# Patient Record
Sex: Female | Born: 1950 | Race: Black or African American | Hispanic: No | Marital: Married | Smoking: Former smoker
Health system: Southern US, Community
[De-identification: ages and names within clinical notes are randomized; demographics above are authoritative.]

## PROBLEM LIST (undated history)

## (undated) DIAGNOSIS — Z9889 Other specified postprocedural states: Secondary | ICD-10-CM

## (undated) DIAGNOSIS — R112 Nausea with vomiting, unspecified: Secondary | ICD-10-CM

## (undated) DIAGNOSIS — M3500 Sicca syndrome, unspecified: Secondary | ICD-10-CM

## (undated) DIAGNOSIS — H269 Unspecified cataract: Secondary | ICD-10-CM

## (undated) DIAGNOSIS — E854 Organ-limited amyloidosis: Secondary | ICD-10-CM

## (undated) DIAGNOSIS — N189 Chronic kidney disease, unspecified: Secondary | ICD-10-CM

## (undated) DIAGNOSIS — M1990 Unspecified osteoarthritis, unspecified site: Secondary | ICD-10-CM

## (undated) DIAGNOSIS — I10 Essential (primary) hypertension: Secondary | ICD-10-CM

## (undated) DIAGNOSIS — J45909 Unspecified asthma, uncomplicated: Secondary | ICD-10-CM

## (undated) DIAGNOSIS — Z9989 Dependence on other enabling machines and devices: Secondary | ICD-10-CM

## (undated) DIAGNOSIS — R519 Headache, unspecified: Secondary | ICD-10-CM

## (undated) DIAGNOSIS — K219 Gastro-esophageal reflux disease without esophagitis: Secondary | ICD-10-CM

## (undated) DIAGNOSIS — M329 Systemic lupus erythematosus, unspecified: Secondary | ICD-10-CM

## (undated) DIAGNOSIS — B009 Herpesviral infection, unspecified: Secondary | ICD-10-CM

## (undated) DIAGNOSIS — I739 Peripheral vascular disease, unspecified: Secondary | ICD-10-CM

## (undated) DIAGNOSIS — C8300 Small cell B-cell lymphoma, unspecified site: Secondary | ICD-10-CM

## (undated) DIAGNOSIS — I639 Cerebral infarction, unspecified: Secondary | ICD-10-CM

## (undated) HISTORY — PX: BACK SURGERY: SHX140

## (undated) HISTORY — DX: Systemic lupus erythematosus, unspecified: M32.9

## (undated) HISTORY — PX: CHOLECYSTECTOMY: SHX55

## (undated) HISTORY — PX: MASS EXCISION: SHX2000

## (undated) HISTORY — DX: Small cell B-cell lymphoma, unspecified site: C83.00

## (undated) HISTORY — PX: FOOT SURGERY: SHX648

## (undated) HISTORY — PX: MRI: SHX5353

## (undated) HISTORY — PX: ABDOMINAL HYSTERECTOMY: SHX81

## (undated) HISTORY — PX: OTHER SURGICAL HISTORY: SHX169

## (undated) HISTORY — PX: COLONOSCOPY: SHX5424

---

## 1998-02-23 ENCOUNTER — Ambulatory Visit (HOSPITAL_COMMUNITY): Admission: RE | Admit: 1998-02-23 | Discharge: 1998-02-23 | Payer: Self-pay | Admitting: Gynecology

## 2000-08-21 ENCOUNTER — Other Ambulatory Visit: Admission: RE | Admit: 2000-08-21 | Discharge: 2000-08-21 | Payer: Self-pay | Admitting: Gynecology

## 2000-09-21 ENCOUNTER — Other Ambulatory Visit: Admission: RE | Admit: 2000-09-21 | Discharge: 2000-09-21 | Payer: Self-pay | Admitting: Otolaryngology

## 2002-07-29 ENCOUNTER — Emergency Department (HOSPITAL_COMMUNITY): Admission: EM | Admit: 2002-07-29 | Discharge: 2002-07-29 | Payer: Self-pay | Admitting: Emergency Medicine

## 2003-11-17 ENCOUNTER — Other Ambulatory Visit: Admission: RE | Admit: 2003-11-17 | Discharge: 2003-11-17 | Payer: Self-pay | Admitting: Gynecology

## 2005-03-26 ENCOUNTER — Emergency Department (HOSPITAL_COMMUNITY): Admission: EM | Admit: 2005-03-26 | Discharge: 2005-03-27 | Payer: Self-pay | Admitting: Emergency Medicine

## 2005-03-28 ENCOUNTER — Encounter: Admission: RE | Admit: 2005-03-28 | Discharge: 2005-03-28 | Payer: Self-pay

## 2005-03-29 ENCOUNTER — Ambulatory Visit: Payer: Self-pay | Admitting: Internal Medicine

## 2005-03-29 ENCOUNTER — Inpatient Hospital Stay (HOSPITAL_COMMUNITY): Admission: AD | Admit: 2005-03-29 | Discharge: 2005-03-31 | Payer: Self-pay | Admitting: Internal Medicine

## 2005-03-30 ENCOUNTER — Encounter (INDEPENDENT_AMBULATORY_CARE_PROVIDER_SITE_OTHER): Payer: Self-pay | Admitting: *Deleted

## 2005-04-01 ENCOUNTER — Ambulatory Visit: Payer: Self-pay | Admitting: Pulmonary Disease

## 2005-04-01 ENCOUNTER — Inpatient Hospital Stay (HOSPITAL_COMMUNITY): Admission: EM | Admit: 2005-04-01 | Discharge: 2005-04-04 | Payer: Self-pay | Admitting: Emergency Medicine

## 2005-04-01 ENCOUNTER — Ambulatory Visit: Payer: Self-pay | Admitting: Internal Medicine

## 2005-04-02 ENCOUNTER — Encounter (INDEPENDENT_AMBULATORY_CARE_PROVIDER_SITE_OTHER): Payer: Self-pay | Admitting: Specialist

## 2005-04-18 ENCOUNTER — Ambulatory Visit: Payer: Self-pay | Admitting: Internal Medicine

## 2005-06-01 ENCOUNTER — Ambulatory Visit: Payer: Self-pay | Admitting: Internal Medicine

## 2005-07-11 ENCOUNTER — Encounter: Admission: RE | Admit: 2005-07-11 | Discharge: 2005-07-11 | Payer: Self-pay

## 2005-09-29 ENCOUNTER — Ambulatory Visit: Payer: Self-pay | Admitting: Internal Medicine

## 2006-01-02 ENCOUNTER — Ambulatory Visit: Payer: Self-pay | Admitting: Internal Medicine

## 2006-04-28 ENCOUNTER — Ambulatory Visit: Payer: Self-pay | Admitting: Internal Medicine

## 2006-05-01 ENCOUNTER — Ambulatory Visit: Payer: Self-pay | Admitting: Internal Medicine

## 2006-05-01 ENCOUNTER — Inpatient Hospital Stay (HOSPITAL_COMMUNITY): Admission: AD | Admit: 2006-05-01 | Discharge: 2006-05-03 | Payer: Self-pay | Admitting: Internal Medicine

## 2006-05-08 ENCOUNTER — Ambulatory Visit: Payer: Self-pay | Admitting: Pulmonary Disease

## 2006-05-08 ENCOUNTER — Ambulatory Visit: Payer: Self-pay | Admitting: Internal Medicine

## 2006-05-16 ENCOUNTER — Ambulatory Visit: Payer: Self-pay | Admitting: Internal Medicine

## 2006-07-19 ENCOUNTER — Ambulatory Visit: Payer: Self-pay | Admitting: Internal Medicine

## 2006-09-15 ENCOUNTER — Inpatient Hospital Stay (HOSPITAL_COMMUNITY): Admission: EM | Admit: 2006-09-15 | Discharge: 2006-09-20 | Payer: Self-pay | Admitting: Emergency Medicine

## 2006-10-04 ENCOUNTER — Ambulatory Visit: Payer: Self-pay | Admitting: Internal Medicine

## 2006-11-07 ENCOUNTER — Encounter: Admission: RE | Admit: 2006-11-07 | Discharge: 2006-11-07 | Payer: Self-pay | Admitting: Neurology

## 2006-11-17 ENCOUNTER — Ambulatory Visit (HOSPITAL_COMMUNITY): Admission: RE | Admit: 2006-11-17 | Discharge: 2006-11-18 | Payer: Self-pay | Admitting: Neurosurgery

## 2007-03-22 ENCOUNTER — Emergency Department (HOSPITAL_COMMUNITY): Admission: EM | Admit: 2007-03-22 | Discharge: 2007-03-22 | Payer: Self-pay | Admitting: Emergency Medicine

## 2007-03-24 ENCOUNTER — Emergency Department (HOSPITAL_COMMUNITY): Admission: EM | Admit: 2007-03-24 | Discharge: 2007-03-25 | Payer: Self-pay | Admitting: Emergency Medicine

## 2007-03-25 ENCOUNTER — Inpatient Hospital Stay (HOSPITAL_COMMUNITY): Admission: EM | Admit: 2007-03-25 | Discharge: 2007-04-25 | Payer: Self-pay | Admitting: *Deleted

## 2007-03-25 ENCOUNTER — Ambulatory Visit: Payer: Self-pay | Admitting: Pulmonary Disease

## 2007-03-25 ENCOUNTER — Ambulatory Visit: Payer: Self-pay | Admitting: Hematology & Oncology

## 2007-03-29 ENCOUNTER — Encounter (INDEPENDENT_AMBULATORY_CARE_PROVIDER_SITE_OTHER): Payer: Self-pay | Admitting: Surgery

## 2007-04-02 ENCOUNTER — Encounter (INDEPENDENT_AMBULATORY_CARE_PROVIDER_SITE_OTHER): Payer: Self-pay | Admitting: Interventional Radiology

## 2007-04-06 ENCOUNTER — Ambulatory Visit: Payer: Self-pay | Admitting: Vascular Surgery

## 2007-04-06 ENCOUNTER — Encounter (INDEPENDENT_AMBULATORY_CARE_PROVIDER_SITE_OTHER): Payer: Self-pay | Admitting: Internal Medicine

## 2007-04-12 ENCOUNTER — Encounter (INDEPENDENT_AMBULATORY_CARE_PROVIDER_SITE_OTHER): Payer: Self-pay | Admitting: Interventional Radiology

## 2007-05-02 ENCOUNTER — Ambulatory Visit: Payer: Self-pay | Admitting: Hematology & Oncology

## 2007-05-14 DIAGNOSIS — E669 Obesity, unspecified: Secondary | ICD-10-CM

## 2007-05-14 DIAGNOSIS — J8410 Pulmonary fibrosis, unspecified: Secondary | ICD-10-CM | POA: Insufficient documentation

## 2007-05-14 DIAGNOSIS — K219 Gastro-esophageal reflux disease without esophagitis: Secondary | ICD-10-CM | POA: Insufficient documentation

## 2007-05-14 DIAGNOSIS — M1990 Unspecified osteoarthritis, unspecified site: Secondary | ICD-10-CM

## 2007-05-14 DIAGNOSIS — E66811 Obesity, class 1: Secondary | ICD-10-CM | POA: Insufficient documentation

## 2007-05-18 LAB — CBC WITH DIFFERENTIAL/PLATELET
Basophils Absolute: 0 10*3/uL (ref 0.0–0.1)
Eosinophils Absolute: 0.1 10*3/uL (ref 0.0–0.5)
HGB: 11.4 g/dL — ABNORMAL LOW (ref 11.6–15.9)
NEUT#: 7.3 10*3/uL — ABNORMAL HIGH (ref 1.5–6.5)
RBC: 3.74 10*6/uL (ref 3.70–5.32)
RDW: 17.1 % — ABNORMAL HIGH (ref 11.3–14.5)
WBC: 10.6 10*3/uL — ABNORMAL HIGH (ref 3.9–10.0)
lymph#: 2.8 10*3/uL (ref 0.9–3.3)

## 2007-05-18 LAB — TECHNOLOGIST REVIEW

## 2007-05-30 ENCOUNTER — Ambulatory Visit (HOSPITAL_COMMUNITY): Admission: RE | Admit: 2007-05-30 | Discharge: 2007-05-30 | Payer: Self-pay | Admitting: Hematology & Oncology

## 2007-06-14 ENCOUNTER — Ambulatory Visit: Payer: Self-pay | Admitting: Hematology & Oncology

## 2007-06-18 LAB — CBC WITH DIFFERENTIAL/PLATELET
BASO%: 0.3 % (ref 0.0–2.0)
LYMPH%: 17.2 % (ref 14.0–48.0)
MCHC: 35 g/dL (ref 32.0–36.0)
MCV: 86.1 fL (ref 81.0–101.0)
MONO%: 11.3 % (ref 0.0–13.0)
Platelets: 209 10*3/uL (ref 145–400)
RBC: 3.37 10*6/uL — ABNORMAL LOW (ref 3.70–5.32)
RDW: 14.2 % (ref 11.3–14.5)
WBC: 2.9 10*3/uL — ABNORMAL LOW (ref 3.9–10.0)

## 2007-06-18 LAB — COMPREHENSIVE METABOLIC PANEL
ALT: 14 U/L (ref 0–35)
AST: 18 U/L (ref 0–37)
Alkaline Phosphatase: 79 U/L (ref 39–117)
Potassium: 3.5 mEq/L (ref 3.5–5.3)
Sodium: 145 mEq/L (ref 135–145)
Total Bilirubin: 0.5 mg/dL (ref 0.3–1.2)
Total Protein: 5.8 g/dL — ABNORMAL LOW (ref 6.0–8.3)

## 2007-06-20 ENCOUNTER — Encounter: Admission: RE | Admit: 2007-06-20 | Discharge: 2007-06-20 | Payer: Self-pay | Admitting: Hematology & Oncology

## 2007-07-02 LAB — CBC WITH DIFFERENTIAL/PLATELET
EOS%: 1 % (ref 0.0–7.0)
Eosinophils Absolute: 0.1 10*3/uL (ref 0.0–0.5)
MCV: 87.6 fL (ref 81.0–101.0)
MONO%: 8.9 % (ref 0.0–13.0)
NEUT#: 6.4 10*3/uL (ref 1.5–6.5)
RBC: 3.93 10*6/uL (ref 3.70–5.32)
RDW: 12.8 % (ref 11.3–14.5)

## 2007-07-02 LAB — COMPREHENSIVE METABOLIC PANEL
Albumin: 3.8 g/dL (ref 3.5–5.2)
Alkaline Phosphatase: 81 U/L (ref 39–117)
BUN: 24 mg/dL — ABNORMAL HIGH (ref 6–23)
Creatinine, Ser: 1.04 mg/dL (ref 0.40–1.20)
Glucose, Bld: 112 mg/dL — ABNORMAL HIGH (ref 70–99)
Total Bilirubin: 0.5 mg/dL (ref 0.3–1.2)

## 2007-07-03 ENCOUNTER — Encounter: Admission: RE | Admit: 2007-07-03 | Discharge: 2007-07-03 | Payer: Self-pay | Admitting: Interventional Radiology

## 2007-07-04 LAB — SPEP & IFE WITH QIG
Beta Globulin: 6.5 % (ref 4.7–7.2)
IgA: 258 mg/dL (ref 68–378)
IgG (Immunoglobin G), Serum: 487 mg/dL — ABNORMAL LOW (ref 694–1618)
IgM, Serum: 71 mg/dL (ref 60–263)
Total Protein, Serum Electrophoresis: 6.1 g/dL (ref 6.0–8.3)

## 2007-07-16 LAB — CBC WITH DIFFERENTIAL/PLATELET
Basophils Absolute: 0 10*3/uL (ref 0.0–0.1)
EOS%: 3.8 % (ref 0.0–7.0)
HCT: 30.4 % — ABNORMAL LOW (ref 34.8–46.6)
HGB: 10.9 g/dL — ABNORMAL LOW (ref 11.6–15.9)
MCH: 31.7 pg (ref 26.0–34.0)
MONO#: 0.4 10*3/uL (ref 0.1–0.9)
NEUT#: 0.1 10*3/uL — CL (ref 1.5–6.5)
NEUT%: 8.1 % — ABNORMAL LOW (ref 39.6–76.8)
RDW: 16.1 % — ABNORMAL HIGH (ref 11.3–14.5)
WBC: 1 10*3/uL — ABNORMAL LOW (ref 3.9–10.0)
lymph#: 0.5 10*3/uL — ABNORMAL LOW (ref 0.9–3.3)

## 2007-07-16 LAB — COMPREHENSIVE METABOLIC PANEL
ALT: 11 U/L (ref 0–35)
AST: 15 U/L (ref 0–37)
Albumin: 3.6 g/dL (ref 3.5–5.2)
BUN: 18 mg/dL (ref 6–23)
CO2: 25 mEq/L (ref 19–32)
Calcium: 8.9 mg/dL (ref 8.4–10.5)
Chloride: 107 mEq/L (ref 96–112)
Potassium: 3.3 mEq/L — ABNORMAL LOW (ref 3.5–5.3)

## 2007-07-26 ENCOUNTER — Ambulatory Visit: Payer: Self-pay | Admitting: Hematology & Oncology

## 2007-07-30 LAB — CBC WITH DIFFERENTIAL/PLATELET
Basophils Absolute: 0.1 10*3/uL (ref 0.0–0.1)
Eosinophils Absolute: 0.1 10*3/uL (ref 0.0–0.5)
HCT: 34.2 % — ABNORMAL LOW (ref 34.8–46.6)
HGB: 12.1 g/dL (ref 11.6–15.9)
LYMPH%: 23.7 % (ref 14.0–48.0)
MONO#: 0.8 10*3/uL (ref 0.1–0.9)
NEUT#: 3.5 10*3/uL (ref 1.5–6.5)
NEUT%: 59 % (ref 39.6–76.8)
Platelets: 137 10*3/uL — ABNORMAL LOW (ref 145–400)
WBC: 5.9 10*3/uL (ref 3.9–10.0)

## 2007-08-03 LAB — SPEP & IFE WITH QIG
Alpha-1-Globulin: 5.5 % — ABNORMAL HIGH (ref 2.9–4.9)
Alpha-2-Globulin: 13.5 % — ABNORMAL HIGH (ref 7.1–11.8)
Beta Globulin: 6.3 % (ref 4.7–7.2)
Total Protein, Serum Electrophoresis: 6.7 g/dL (ref 6.0–8.3)

## 2007-08-03 LAB — COMPREHENSIVE METABOLIC PANEL
ALT: 16 U/L (ref 0–35)
AST: 21 U/L (ref 0–37)
Chloride: 104 mEq/L (ref 96–112)
Creatinine, Ser: 1.3 mg/dL — ABNORMAL HIGH (ref 0.40–1.20)
Sodium: 144 mEq/L (ref 135–145)
Total Bilirubin: 0.5 mg/dL (ref 0.3–1.2)
Total Protein: 6.7 g/dL (ref 6.0–8.3)

## 2007-08-27 LAB — CBC WITH DIFFERENTIAL/PLATELET
BASO%: 1 % (ref 0.0–2.0)
EOS%: 2.9 % (ref 0.0–7.0)
HCT: 32.7 % — ABNORMAL LOW (ref 34.8–46.6)
LYMPH%: 13.5 % — ABNORMAL LOW (ref 14.0–48.0)
MCH: 32.1 pg (ref 26.0–34.0)
MCHC: 34.8 g/dL (ref 32.0–36.0)
MONO#: 0.7 10*3/uL (ref 0.1–0.9)
NEUT%: 69.9 % (ref 39.6–76.8)
Platelets: DECREASED 10*3/uL (ref 145–400)
RBC: 3.55 10*6/uL — ABNORMAL LOW (ref 3.70–5.32)
WBC: 5.5 10*3/uL (ref 3.9–10.0)

## 2007-08-27 LAB — COMPREHENSIVE METABOLIC PANEL
Albumin: 4 g/dL (ref 3.5–5.2)
BUN: 18 mg/dL (ref 6–23)
CO2: 29 mEq/L (ref 19–32)
Calcium: 8.9 mg/dL (ref 8.4–10.5)
Chloride: 110 mEq/L (ref 96–112)
Glucose, Bld: 93 mg/dL (ref 70–99)
Potassium: 3.2 mEq/L — ABNORMAL LOW (ref 3.5–5.3)
Sodium: 142 mEq/L (ref 135–145)
Total Protein: 6.7 g/dL (ref 6.0–8.3)

## 2007-08-29 LAB — PROTEIN ELECTROPHORESIS, SERUM
Alpha-1-Globulin: 5.3 % — ABNORMAL HIGH (ref 2.9–4.9)
Alpha-2-Globulin: 14.9 % — ABNORMAL HIGH (ref 7.1–11.8)
Beta Globulin: 5.8 % (ref 4.7–7.2)
Total Protein, Serum Electrophoresis: 7 g/dL (ref 6.0–8.3)

## 2007-09-17 ENCOUNTER — Ambulatory Visit (HOSPITAL_COMMUNITY): Admission: RE | Admit: 2007-09-17 | Discharge: 2007-09-17 | Payer: Self-pay | Admitting: Hematology & Oncology

## 2007-09-20 ENCOUNTER — Ambulatory Visit: Payer: Self-pay | Admitting: Hematology & Oncology

## 2007-09-24 LAB — CBC WITH DIFFERENTIAL/PLATELET
Basophils Absolute: 0.1 10*3/uL (ref 0.0–0.1)
Eosinophils Absolute: 0 10*3/uL (ref 0.0–0.5)
HGB: 12 g/dL (ref 11.6–15.9)
MONO#: 0.4 10*3/uL (ref 0.1–0.9)
NEUT#: 3.1 10*3/uL (ref 1.5–6.5)
RBC: 3.73 10*6/uL (ref 3.70–5.32)
RDW: 16.1 % — ABNORMAL HIGH (ref 11.3–14.5)
WBC: 3.9 10*3/uL (ref 3.9–10.0)
lymph#: 0.4 10*3/uL — ABNORMAL LOW (ref 0.9–3.3)

## 2007-09-26 LAB — IGG, IGA, IGM
IgA: 260 mg/dL (ref 68–378)
IgM, Serum: 20 mg/dL — ABNORMAL LOW (ref 60–263)

## 2007-09-26 LAB — COMPREHENSIVE METABOLIC PANEL
Albumin: 4.1 g/dL (ref 3.5–5.2)
BUN: 17 mg/dL (ref 6–23)
Calcium: 9.8 mg/dL (ref 8.4–10.5)
Chloride: 104 mEq/L (ref 96–112)
Glucose, Bld: 88 mg/dL (ref 70–99)
Potassium: 3 mEq/L — ABNORMAL LOW (ref 3.5–5.3)
Sodium: 146 mEq/L — ABNORMAL HIGH (ref 135–145)
Total Protein: 6.7 g/dL (ref 6.0–8.3)

## 2007-09-26 LAB — PROTEIN ELECTROPHORESIS, SERUM
Alpha-2-Globulin: 14.4 % — ABNORMAL HIGH (ref 7.1–11.8)
Beta 2: 5.4 % (ref 3.2–6.5)
Gamma Globulin: 12.2 % (ref 11.1–18.8)

## 2007-10-29 ENCOUNTER — Ambulatory Visit: Payer: Self-pay | Admitting: Hematology & Oncology

## 2007-10-29 LAB — COMPREHENSIVE METABOLIC PANEL
Albumin: 4.2 g/dL (ref 3.5–5.2)
Alkaline Phosphatase: 108 U/L (ref 39–117)
BUN: 18 mg/dL (ref 6–23)
CO2: 27 mEq/L (ref 19–32)
Calcium: 9.1 mg/dL (ref 8.4–10.5)
Glucose, Bld: 95 mg/dL (ref 70–99)
Potassium: 3 mEq/L — ABNORMAL LOW (ref 3.5–5.3)

## 2007-10-29 LAB — CBC WITH DIFFERENTIAL/PLATELET
BASO%: 0.9 % (ref 0.0–2.0)
EOS%: 0.4 % (ref 0.0–7.0)
MCH: 33.1 pg (ref 26.0–34.0)
MCHC: 35.7 g/dL (ref 32.0–36.0)
NEUT%: 73.4 % (ref 39.6–76.8)
RBC: 3.22 10*6/uL — ABNORMAL LOW (ref 3.70–5.32)
RDW: 16 % — ABNORMAL HIGH (ref 11.3–14.5)
lymph#: 0.3 10*3/uL — ABNORMAL LOW (ref 0.9–3.3)

## 2007-11-20 ENCOUNTER — Ambulatory Visit (HOSPITAL_COMMUNITY): Admission: RE | Admit: 2007-11-20 | Discharge: 2007-11-20 | Payer: Self-pay | Admitting: Hematology & Oncology

## 2007-12-03 LAB — SPEP & IFE WITH QIG
Beta 2: 6.4 % (ref 3.2–6.5)
Gamma Globulin: 10.6 % — ABNORMAL LOW (ref 11.1–18.8)
IgA: 212 mg/dL (ref 68–378)
IgM, Serum: 15 mg/dL — ABNORMAL LOW (ref 60–263)

## 2007-12-12 ENCOUNTER — Ambulatory Visit: Payer: Self-pay | Admitting: Hematology & Oncology

## 2007-12-17 LAB — CBC WITH DIFFERENTIAL/PLATELET
BASO%: 0.3 % (ref 0.0–2.0)
Basophils Absolute: 0 10*3/uL (ref 0.0–0.1)
EOS%: 0 % (ref 0.0–7.0)
HGB: 9.6 g/dL — ABNORMAL LOW (ref 11.6–15.9)
MCH: 34 pg (ref 26.0–34.0)
MCHC: 34.5 g/dL (ref 32.0–36.0)
MONO#: 0.5 10*3/uL (ref 0.1–0.9)
RDW: 19.7 % — ABNORMAL HIGH (ref 11.3–14.5)
WBC: 3.4 10*3/uL — ABNORMAL LOW (ref 3.9–10.0)
lymph#: 0.6 10*3/uL — ABNORMAL LOW (ref 0.9–3.3)

## 2008-02-04 ENCOUNTER — Ambulatory Visit: Payer: Self-pay | Admitting: Hematology & Oncology

## 2008-03-14 ENCOUNTER — Ambulatory Visit: Payer: Self-pay | Admitting: Hematology & Oncology

## 2008-03-17 LAB — CBC WITH DIFFERENTIAL (CANCER CENTER ONLY)
BASO#: 0 10*3/uL (ref 0.0–0.2)
Eosinophils Absolute: 0.1 10*3/uL (ref 0.0–0.5)
HGB: 12.8 g/dL (ref 11.6–15.9)
LYMPH#: 0.8 10*3/uL — ABNORMAL LOW (ref 0.9–3.3)
MCH: 32.4 pg (ref 26.0–34.0)
MONO#: 0.4 10*3/uL (ref 0.1–0.9)
MONO%: 9.7 % (ref 0.0–13.0)
NEUT#: 3.1 10*3/uL (ref 1.5–6.5)
Platelets: 107 10*3/uL — ABNORMAL LOW (ref 145–400)
RBC: 3.95 10*6/uL (ref 3.70–5.32)
WBC: 4.4 10*3/uL (ref 3.9–10.0)

## 2008-03-19 ENCOUNTER — Ambulatory Visit (HOSPITAL_COMMUNITY): Admission: RE | Admit: 2008-03-19 | Discharge: 2008-03-19 | Payer: Self-pay | Admitting: Hematology & Oncology

## 2008-03-19 LAB — COMPREHENSIVE METABOLIC PANEL
ALT: 14 U/L (ref 0–35)
Albumin: 4.5 g/dL (ref 3.5–5.2)
CO2: 27 mEq/L (ref 19–32)
Calcium: 9.6 mg/dL (ref 8.4–10.5)
Chloride: 102 mEq/L (ref 96–112)
Creatinine, Ser: 0.98 mg/dL (ref 0.40–1.20)
Sodium: 143 mEq/L (ref 135–145)
Total Protein: 7.1 g/dL (ref 6.0–8.3)

## 2008-03-19 LAB — PROTEIN ELECTROPHORESIS, SERUM
Beta 2: 5.8 % (ref 3.2–6.5)
Beta Globulin: 6 % (ref 4.7–7.2)
Gamma Globulin: 10.8 % — ABNORMAL LOW (ref 11.1–18.8)

## 2008-03-19 LAB — IGG, IGA, IGM: IgG (Immunoglobin G), Serum: 749 mg/dL (ref 694–1618)

## 2008-03-27 LAB — BASIC METABOLIC PANEL - CANCER CENTER ONLY
CO2: 32 mEq/L (ref 18–33)
Calcium: 9.4 mg/dL (ref 8.0–10.3)
Creat: 1.1 mg/dl (ref 0.6–1.2)

## 2008-05-06 ENCOUNTER — Ambulatory Visit: Payer: Self-pay | Admitting: Hematology & Oncology

## 2008-06-18 ENCOUNTER — Ambulatory Visit: Payer: Self-pay | Admitting: Hematology & Oncology

## 2008-06-19 LAB — CBC WITH DIFFERENTIAL (CANCER CENTER ONLY)
BASO#: 0 10*3/uL (ref 0.0–0.2)
EOS%: 1.9 % (ref 0.0–7.0)
HGB: 11.9 g/dL (ref 11.6–15.9)
LYMPH#: 1.1 10*3/uL (ref 0.9–3.3)
MCHC: 34.2 g/dL (ref 32.0–36.0)
NEUT#: 3.5 10*3/uL (ref 1.5–6.5)
Platelets: 88 10*3/uL — ABNORMAL LOW (ref 145–400)
RBC: 3.66 10*6/uL — ABNORMAL LOW (ref 3.70–5.32)

## 2008-06-19 LAB — CMP (CANCER CENTER ONLY)
ALT(SGPT): 14 U/L (ref 10–47)
Alkaline Phosphatase: 116 U/L — ABNORMAL HIGH (ref 26–84)
CO2: 31 mEq/L (ref 18–33)
Creat: 0.9 mg/dl (ref 0.6–1.2)
Total Bilirubin: 0.7 mg/dl (ref 0.20–1.60)

## 2008-06-23 LAB — PROTEIN ELECTROPHORESIS, SERUM
Albumin ELP: 58.8 % (ref 55.8–66.1)
Beta 2: 4.3 % (ref 3.2–6.5)
Beta Globulin: 6 % (ref 4.7–7.2)

## 2008-06-23 LAB — LACTATE DEHYDROGENASE: LDH: 125 U/L (ref 94–250)

## 2008-06-23 LAB — KAPPA/LAMBDA LIGHT CHAINS
Kappa free light chain: 2.48 mg/dL — ABNORMAL HIGH (ref 0.33–1.94)
Kappa:Lambda Ratio: 2 — ABNORMAL HIGH (ref 0.26–1.65)

## 2008-06-23 LAB — IGG, IGA, IGM
IgA: 212 mg/dL (ref 68–378)
IgG (Immunoglobin G), Serum: 772 mg/dL (ref 694–1618)
IgM, Serum: <4 mg/dL — ABNORMAL LOW (ref 60–263)

## 2008-06-23 LAB — BETA 2 MICROGLOBULIN, SERUM: Beta-2 Microglobulin: 2.45 mg/L — ABNORMAL HIGH (ref 1.01–1.73)

## 2008-07-29 ENCOUNTER — Ambulatory Visit: Payer: Self-pay | Admitting: Hematology & Oncology

## 2008-08-27 ENCOUNTER — Ambulatory Visit (HOSPITAL_COMMUNITY): Admission: RE | Admit: 2008-08-27 | Discharge: 2008-08-27 | Payer: Self-pay | Admitting: Hematology & Oncology

## 2008-09-09 ENCOUNTER — Ambulatory Visit: Payer: Self-pay | Admitting: Hematology & Oncology

## 2008-09-10 LAB — CBC WITH DIFFERENTIAL (CANCER CENTER ONLY)
BASO%: 0.5 % (ref 0.0–2.0)
EOS%: 2.4 % (ref 0.0–7.0)
LYMPH#: 1 10*3/uL (ref 0.9–3.3)
MCHC: 33.3 g/dL (ref 32.0–36.0)
MONO%: 9.6 % (ref 0.0–13.0)
NEUT#: 2.8 10*3/uL (ref 1.5–6.5)
NEUT%: 63.9 % (ref 39.6–80.0)
RDW: 11.6 % (ref 10.5–14.6)

## 2008-09-12 LAB — COMPREHENSIVE METABOLIC PANEL
AST: 12 U/L (ref 0–37)
Albumin: 4 g/dL (ref 3.5–5.2)
BUN: 15 mg/dL (ref 6–23)
CO2: 29 mEq/L (ref 19–32)
Calcium: 8.8 mg/dL (ref 8.4–10.5)
Chloride: 102 mEq/L (ref 96–112)
Creatinine, Ser: 0.97 mg/dL (ref 0.40–1.20)
Glucose, Bld: 107 mg/dL — ABNORMAL HIGH (ref 70–99)
Potassium: 2.8 mEq/L — ABNORMAL LOW (ref 3.5–5.3)

## 2008-09-12 LAB — SPEP & IFE WITH QIG
Alpha-1-Globulin: 5.6 % — ABNORMAL HIGH (ref 2.9–4.9)
Beta 2: 4.6 % (ref 3.2–6.5)
Gamma Globulin: 8.7 % — ABNORMAL LOW (ref 11.1–18.8)
IgA: 167 mg/dL (ref 68–378)
IgG (Immunoglobin G), Serum: 515 mg/dL — ABNORMAL LOW (ref 694–1618)
IgM, Serum: 11 mg/dL — ABNORMAL LOW (ref 60–263)

## 2008-09-12 LAB — KAPPA/LAMBDA LIGHT CHAINS
Kappa free light chain: <0.6 mg/dL (ref 0.33–1.94)
Lambda Free Lght Chn: 1.12 mg/dL (ref 0.57–2.63)

## 2008-09-20 IMAGING — CT NM PET TUM IMG SKULL BASE T - THIGH
6 series · 25 of 25 positions shown · IV contrast ([ID])
Comparison: 09/17/2007

CLINICAL DATA: Evaluate recurrent lymphoma.

NUCLEAR MEDICINE PET SKULL BASE TO THIGH
TECHNIQUE: 17.8 mCi F-18 FDG was injected intravenously via the
left forearm.  Full-ring PET imaging was performed from the skull
base through the mid-thighs 65  minutes after injection.  CT data
was obtained and used for attenuation correction and anatomic
localization only.  (This was not acquired as a diagnostic CT
examination.)
Fasting Blood Glucose:  106

[Series 1: pet ac · axial · 3.3mm · 4.69mm/px · z∈[-870,+0]mm · 6 of 267 slices shown]
[im 1/267]
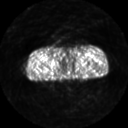
[im 54/267]
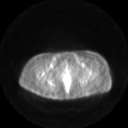
[im 107/267]
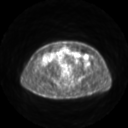
[im 160/267]
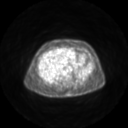
[im 213/267]
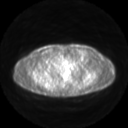
[im 267/267]
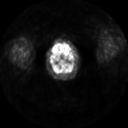

[Series 2: pet nac · axial · 3.3mm · 4.69mm/px · z∈[-870,+0]mm · 6 of 267 slices shown]
[im 1/267]
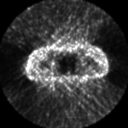
[im 54/267]
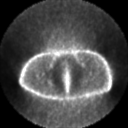
[im 107/267]
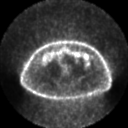
[im 160/267]
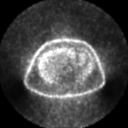
[im 213/267]
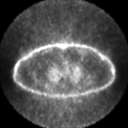
[im 267/267]
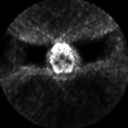

[Series 2: ct images · axial · 3.8mm · 0.98mm/px · z∈[-870,+0]mm · 6 of 259 slices shown]
[im 1/259]
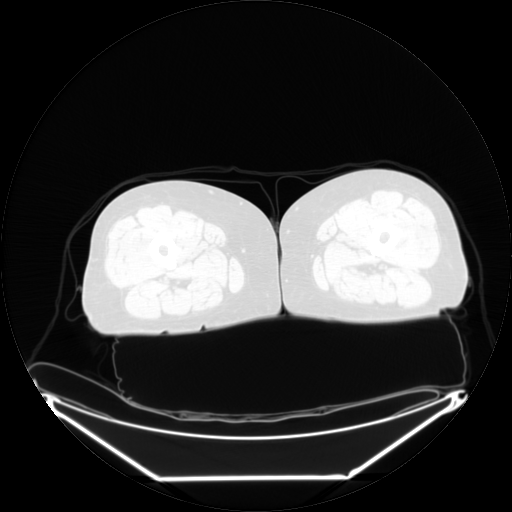
[im 52/259]
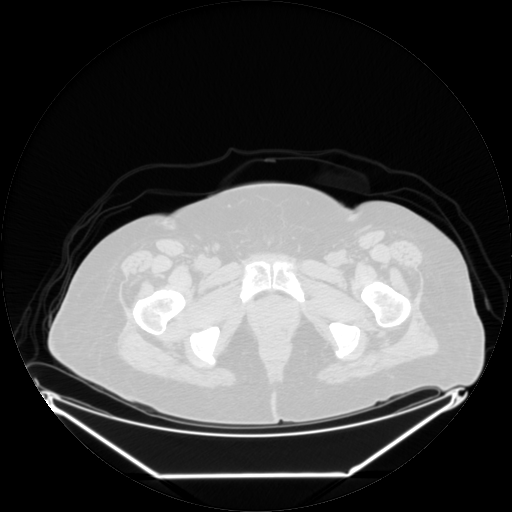
[im 104/259]
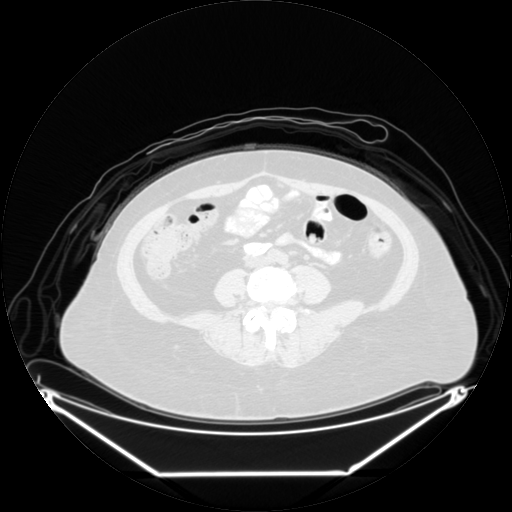
[im 155/259]
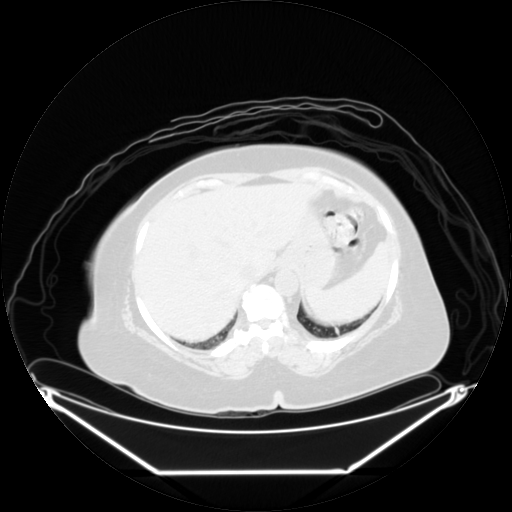
[im 207/259]
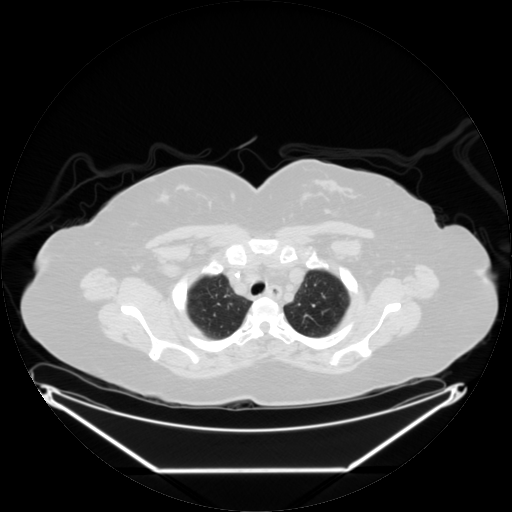
[im 259/259  brain]
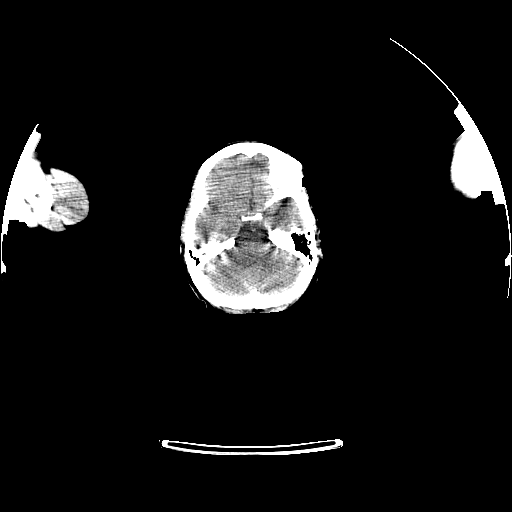

[Series 123: mip · coronal · 3.3mm · 4.69mm/px · 1 of 30 slices shown]
[im 1/30]
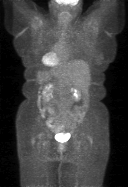

[Series 151: reformatted · axial · 3.3mm · 3.91mm/px · z∈[-703,-49]mm · 5 of 199 slices shown (1 of 2)]
[im 1/199]
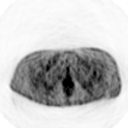
[im 50/199]
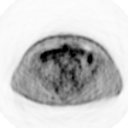
[im 100/199]
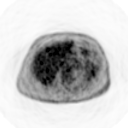
[im 149/199]
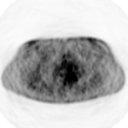
[im 199/199]
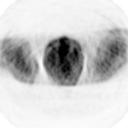

[Series 153: reformatted · coronal · 4.7mm · 6.98mm/px · 1 of 61 slices shown (2 of 2)]
[im 1/61]
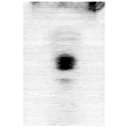

[25 of 25 positions shown; findings below may reference images not displayed]

FINDINGS: Mildly increased F D G activity in the porta hepatis does
not have a definite CT correlate.  No additional foci of abnormal F
D G activity.

CT images show no pathologically enlarged lymph nodes in the neck,
chest, abdomen or pelvis.  Previously measured right axillary lymph
node has decreased in size, now measuring 11 mm in maximum
dimension (previously 18 mm in maximal dimension).  There is a
probable small cyst or hemangioma in the dome the liver.
Retroperitoneal lymph nodes are not enlarged.  Index left
periaortic lymph node (image 134) measures 8 mm short axis, stable.
Pelvic lymph nodes are not unenlarged and stable.
IMPRESSION: 1.  No evidence of residual metabolically active lymphoma.
2.  Mild increased uptake in the porta hepatis without definite CT
correlate.  Attention on follow-up exams is warranted.

## 2008-10-28 ENCOUNTER — Ambulatory Visit: Payer: Self-pay | Admitting: Hematology & Oncology

## 2008-10-29 LAB — CBC WITH DIFFERENTIAL (CANCER CENTER ONLY)
BASO%: 0.5 % (ref 0.0–2.0)
EOS%: 2.5 % (ref 0.0–7.0)
HCT: 35.4 % (ref 34.8–46.6)
LYMPH%: 17.7 % (ref 14.0–48.0)
MCH: 32.6 pg (ref 26.0–34.0)
MCHC: 34.2 g/dL (ref 32.0–36.0)
MCV: 95 fL (ref 81–101)
NEUT%: 71.6 % (ref 39.6–80.0)
RDW: 11.9 % (ref 10.5–14.6)

## 2008-10-31 LAB — COMPREHENSIVE METABOLIC PANEL
AST: 13 U/L (ref 0–37)
Albumin: 3.8 g/dL (ref 3.5–5.2)
BUN: 13 mg/dL (ref 6–23)
Calcium: 8.8 mg/dL (ref 8.4–10.5)
Chloride: 106 mEq/L (ref 96–112)
Glucose, Bld: 99 mg/dL (ref 70–99)
Potassium: 3.4 mEq/L — ABNORMAL LOW (ref 3.5–5.3)
Sodium: 143 mEq/L (ref 135–145)
Total Protein: 6 g/dL (ref 6.0–8.3)

## 2008-10-31 LAB — SPEP & IFE WITH QIG
Albumin ELP: 58.3 % (ref 55.8–66.1)
Alpha-2-Globulin: 16.4 % — ABNORMAL HIGH (ref 7.1–11.8)
Beta 2: 4.6 % (ref 3.2–6.5)
Beta Globulin: 5.8 % (ref 4.7–7.2)
IgA: 182 mg/dL (ref 68–378)
Total Protein, Serum Electrophoresis: 6 g/dL (ref 6.0–8.3)

## 2009-01-12 ENCOUNTER — Ambulatory Visit (HOSPITAL_COMMUNITY): Admission: RE | Admit: 2009-01-12 | Discharge: 2009-01-12 | Payer: Self-pay | Admitting: Hematology & Oncology

## 2009-01-20 ENCOUNTER — Ambulatory Visit: Payer: Self-pay | Admitting: Hematology & Oncology

## 2009-01-21 LAB — CBC WITH DIFFERENTIAL (CANCER CENTER ONLY)
BASO%: 0.4 % (ref 0.0–2.0)
Eosinophils Absolute: 0.2 10*3/uL (ref 0.0–0.5)
HCT: 32.7 % — ABNORMAL LOW (ref 34.8–46.6)
LYMPH%: 25.1 % (ref 14.0–48.0)
MCV: 96 fL (ref 81–101)
MONO#: 0.4 10*3/uL (ref 0.1–0.9)
NEUT%: 62.9 % (ref 39.6–80.0)
RDW: 11.7 % (ref 10.5–14.6)
WBC: 5 10*3/uL (ref 3.9–10.0)

## 2009-01-23 LAB — PROTEIN ELECTROPHORESIS, SERUM
Albumin ELP: 61 % (ref 55.8–66.1)
Total Protein, Serum Electrophoresis: 5.5 g/dL — ABNORMAL LOW (ref 6.0–8.3)

## 2009-01-23 LAB — IGG, IGA, IGM
IgA: 165 mg/dL (ref 68–378)
IgM, Serum: 9 mg/dL — ABNORMAL LOW (ref 60–263)

## 2009-01-23 LAB — KAPPA/LAMBDA LIGHT CHAINS: Lambda Free Lght Chn: 1.74 mg/dL (ref 0.57–2.63)

## 2009-01-23 LAB — COMPREHENSIVE METABOLIC PANEL
Albumin: 3.5 g/dL (ref 3.5–5.2)
BUN: 15 mg/dL (ref 6–23)
Calcium: 8.8 mg/dL (ref 8.4–10.5)
Chloride: 111 mEq/L (ref 96–112)
Glucose, Bld: 94 mg/dL (ref 70–99)
Potassium: 3.7 mEq/L (ref 3.5–5.3)

## 2009-01-23 LAB — LACTATE DEHYDROGENASE: LDH: 99 U/L (ref 94–250)

## 2009-01-26 ENCOUNTER — Ambulatory Visit (HOSPITAL_COMMUNITY): Admission: RE | Admit: 2009-01-26 | Discharge: 2009-01-26 | Payer: Self-pay | Admitting: Hematology & Oncology

## 2009-04-07 ENCOUNTER — Ambulatory Visit: Payer: Self-pay | Admitting: Hematology & Oncology

## 2009-04-08 LAB — CBC WITH DIFFERENTIAL (CANCER CENTER ONLY)
Eosinophils Absolute: 0.1 10*3/uL (ref 0.0–0.5)
HCT: 35.4 % (ref 34.8–46.6)
HGB: 12 g/dL (ref 11.6–15.9)
LYMPH%: 28 % (ref 14.0–48.0)
MCV: 97 fL (ref 81–101)
MONO#: 0.5 10*3/uL (ref 0.1–0.9)
Platelets: 116 10*3/uL — ABNORMAL LOW (ref 145–400)
RBC: 3.65 10*6/uL — ABNORMAL LOW (ref 3.70–5.32)
WBC: 5.9 10*3/uL (ref 3.9–10.0)

## 2009-04-08 LAB — CHCC SATELLITE - SMEAR

## 2009-04-13 LAB — COMPREHENSIVE METABOLIC PANEL
ALT: 11 U/L (ref 0–35)
AST: 12 U/L (ref 0–37)
Albumin: 4 g/dL (ref 3.5–5.2)
Alkaline Phosphatase: 113 U/L (ref 39–117)
Potassium: 3.2 mEq/L — ABNORMAL LOW (ref 3.5–5.3)
Sodium: 145 mEq/L (ref 135–145)
Total Bilirubin: 0.5 mg/dL (ref 0.3–1.2)
Total Protein: 6.1 g/dL (ref 6.0–8.3)

## 2009-04-13 LAB — PROTEIN ELECTROPHORESIS, SERUM
Albumin ELP: 62.3 % (ref 55.8–66.1)
Alpha-1-Globulin: 7 % — ABNORMAL HIGH (ref 2.9–4.9)
Alpha-2-Globulin: 13 % — ABNORMAL HIGH (ref 7.1–11.8)
Beta 2: 3.6 % (ref 3.2–6.5)
Beta Globulin: 5.9 % (ref 4.7–7.2)

## 2009-04-13 LAB — KAPPA/LAMBDA LIGHT CHAINS: Lambda Free Lght Chn: 0.83 mg/dL (ref 0.57–2.63)

## 2009-07-21 ENCOUNTER — Ambulatory Visit: Payer: Self-pay | Admitting: Hematology & Oncology

## 2009-07-23 LAB — CBC WITH DIFFERENTIAL (CANCER CENTER ONLY)
BASO%: 0.5 % (ref 0.0–2.0)
EOS%: 1.4 % (ref 0.0–7.0)
LYMPH%: 30.3 % (ref 14.0–48.0)
MCH: 33.3 pg (ref 26.0–34.0)
MCHC: 34.5 g/dL (ref 32.0–36.0)
MCV: 96 fL (ref 81–101)
MONO%: 7.1 % (ref 0.0–13.0)
Platelets: 118 10*3/uL — ABNORMAL LOW (ref 145–400)
RDW: 11.3 % (ref 10.5–14.6)

## 2009-07-28 LAB — COMPREHENSIVE METABOLIC PANEL
ALT: 10 U/L (ref 0–35)
AST: 13 U/L (ref 0–37)
Alkaline Phosphatase: 93 U/L (ref 39–117)
Glucose, Bld: 103 mg/dL — ABNORMAL HIGH (ref 70–99)
Sodium: 146 mEq/L — ABNORMAL HIGH (ref 135–145)
Total Bilirubin: 0.4 mg/dL (ref 0.3–1.2)
Total Protein: 6.1 g/dL (ref 6.0–8.3)

## 2009-07-28 LAB — SPEP & IFE WITH QIG
Alpha-2-Globulin: 14.9 % — ABNORMAL HIGH (ref 7.1–11.8)
IgG (Immunoglobin G), Serum: 515 mg/dL — ABNORMAL LOW (ref 694–1618)
Total Protein, Serum Electrophoresis: 6.1 g/dL (ref 6.0–8.3)

## 2009-11-18 ENCOUNTER — Ambulatory Visit: Payer: Self-pay | Admitting: Hematology & Oncology

## 2009-11-20 LAB — CBC WITH DIFFERENTIAL (CANCER CENTER ONLY)
BASO#: 0 10*3/uL (ref 0.0–0.2)
EOS%: 1.2 % (ref 0.0–7.0)
HGB: 12.5 g/dL (ref 11.6–15.9)
LYMPH#: 1.9 10*3/uL (ref 0.9–3.3)
MCHC: 34.1 g/dL (ref 32.0–36.0)
NEUT#: 3.2 10*3/uL (ref 1.5–6.5)
Platelets: 137 10*3/uL — ABNORMAL LOW (ref 145–400)
RBC: 3.82 10*6/uL (ref 3.70–5.32)

## 2009-11-24 LAB — COMPREHENSIVE METABOLIC PANEL
ALT: 12 U/L (ref 0–35)
AST: 12 U/L (ref 0–37)
Albumin: 4.1 g/dL (ref 3.5–5.2)
Alkaline Phosphatase: 97 U/L (ref 39–117)
BUN: 15 mg/dL (ref 6–23)
Calcium: 8.5 mg/dL (ref 8.4–10.5)
Chloride: 106 mEq/L (ref 96–112)
Potassium: 3.4 mEq/L — ABNORMAL LOW (ref 3.5–5.3)
Sodium: 143 mEq/L (ref 135–145)
Total Protein: 6.2 g/dL (ref 6.0–8.3)

## 2009-11-24 LAB — SPEP & IFE WITH QIG
Albumin ELP: 60.9 % (ref 55.8–66.1)
Alpha-1-Globulin: 5.5 % — ABNORMAL HIGH (ref 2.9–4.9)
Alpha-2-Globulin: 14.6 % — ABNORMAL HIGH (ref 7.1–11.8)
Beta 2: 4.5 % (ref 3.2–6.5)
Beta Globulin: 6.7 % (ref 4.7–7.2)
Total Protein, Serum Electrophoresis: 6.2 g/dL (ref 6.0–8.3)

## 2010-03-01 ENCOUNTER — Other Ambulatory Visit: Admission: RE | Admit: 2010-03-01 | Discharge: 2010-03-01 | Payer: Self-pay | Admitting: Internal Medicine

## 2010-03-09 ENCOUNTER — Encounter: Admission: RE | Admit: 2010-03-09 | Discharge: 2010-03-09 | Payer: Self-pay | Admitting: Internal Medicine

## 2010-03-18 ENCOUNTER — Ambulatory Visit: Payer: Self-pay | Admitting: Hematology & Oncology

## 2010-03-19 LAB — CBC WITH DIFFERENTIAL (CANCER CENTER ONLY)
Eosinophils Absolute: 0.1 10*3/uL (ref 0.0–0.5)
MCV: 96 fL (ref 81–101)
MONO#: 0.5 10*3/uL (ref 0.1–0.9)
NEUT#: 3.7 10*3/uL (ref 1.5–6.5)
Platelets: 155 10*3/uL (ref 145–400)
RBC: 3.78 10*6/uL (ref 3.70–5.32)
WBC: 5.8 10*3/uL (ref 3.9–10.0)

## 2010-03-19 LAB — CHCC SATELLITE - SMEAR

## 2010-03-23 LAB — SPEP & IFE WITH QIG
Albumin ELP: 61.2 % (ref 55.8–66.1)
Beta 2: 4.3 % (ref 3.2–6.5)
IgA: 152 mg/dL (ref 68–378)
IgM, Serum: <4 mg/dL — ABNORMAL LOW (ref 60–263)
Total Protein, Serum Electrophoresis: 6.2 g/dL (ref 6.0–8.3)

## 2010-03-23 LAB — COMPREHENSIVE METABOLIC PANEL
ALT: 12 U/L (ref 0–35)
CO2: 27 mEq/L (ref 19–32)
Calcium: 8.9 mg/dL (ref 8.4–10.5)
Chloride: 107 mEq/L (ref 96–112)
Potassium: 3.8 mEq/L (ref 3.5–5.3)
Sodium: 145 mEq/L (ref 135–145)
Total Protein: 6.2 g/dL (ref 6.0–8.3)

## 2010-03-23 LAB — LACTATE DEHYDROGENASE: LDH: 95 U/L (ref 94–250)

## 2010-04-09 ENCOUNTER — Encounter: Admission: RE | Admit: 2010-04-09 | Discharge: 2010-04-09 | Payer: Self-pay | Admitting: Internal Medicine

## 2010-07-23 ENCOUNTER — Ambulatory Visit: Payer: Self-pay | Admitting: Hematology & Oncology

## 2010-07-26 LAB — CBC WITH DIFFERENTIAL (CANCER CENTER ONLY)
BASO%: 0.4 % (ref 0.0–2.0)
EOS%: 1.7 % (ref 0.0–7.0)
HCT: 36.2 % (ref 34.8–46.6)
LYMPH#: 1.8 10*3/uL (ref 0.9–3.3)
MCHC: 33.9 g/dL (ref 32.0–36.0)
MONO#: 0.4 10*3/uL (ref 0.1–0.9)
NEUT#: 3.1 10*3/uL (ref 1.5–6.5)
NEUT%: 56.7 % (ref 39.6–80.0)
Platelets: 122 10*3/uL — ABNORMAL LOW (ref 145–400)
RDW: 11.5 % (ref 10.5–14.6)
WBC: 5.4 10*3/uL (ref 3.9–10.0)

## 2010-07-28 LAB — SPEP & IFE WITH QIG
Alpha-2-Globulin: 14.7 % — ABNORMAL HIGH (ref 7.1–11.8)
Beta Globulin: 5.8 % (ref 4.7–7.2)
Gamma Globulin: 7.8 % — ABNORMAL LOW (ref 11.1–18.8)
IgA: 145 mg/dL (ref 68–378)
IgG (Immunoglobin G), Serum: 589 mg/dL — ABNORMAL LOW (ref 694–1618)

## 2010-07-28 LAB — COMPREHENSIVE METABOLIC PANEL
ALT: 10 U/L (ref 0–35)
Alkaline Phosphatase: 98 U/L (ref 39–117)
CO2: 28 mEq/L (ref 19–32)
Creatinine, Ser: 0.91 mg/dL (ref 0.40–1.20)
Glucose, Bld: 98 mg/dL (ref 70–99)
Total Bilirubin: 0.6 mg/dL (ref 0.3–1.2)

## 2010-07-28 LAB — KAPPA/LAMBDA LIGHT CHAINS: Kappa free light chain: <0.66 mg/dL (ref 0.33–1.94)

## 2010-07-28 LAB — RETICULOCYTES (CHCC)
ABS Retic: 53.2 10*3/uL (ref 19.0–186.0)
Retic Ct Pct: 1.4 % (ref 0.4–3.1)

## 2010-07-28 LAB — LACTATE DEHYDROGENASE: LDH: 91 U/L — ABNORMAL LOW (ref 94–250)

## 2010-11-03 ENCOUNTER — Other Ambulatory Visit: Payer: Self-pay | Admitting: Hematology & Oncology

## 2010-11-03 ENCOUNTER — Encounter (HOSPITAL_BASED_OUTPATIENT_CLINIC_OR_DEPARTMENT_OTHER): Payer: BC Managed Care – PPO | Admitting: Hematology & Oncology

## 2010-11-03 DIAGNOSIS — C8589 Other specified types of non-Hodgkin lymphoma, extranodal and solid organ sites: Secondary | ICD-10-CM

## 2010-11-03 DIAGNOSIS — N189 Chronic kidney disease, unspecified: Secondary | ICD-10-CM

## 2010-11-03 DIAGNOSIS — D631 Anemia in chronic kidney disease: Secondary | ICD-10-CM

## 2010-11-03 DIAGNOSIS — M329 Systemic lupus erythematosus, unspecified: Secondary | ICD-10-CM

## 2010-11-03 LAB — CBC WITH DIFFERENTIAL (CANCER CENTER ONLY)
BASO#: 0 10*3/uL (ref 0.0–0.2)
Eosinophils Absolute: 0.1 10*3/uL (ref 0.0–0.5)
HGB: 12.5 g/dL (ref 11.6–15.9)
LYMPH%: 24.9 % (ref 14.0–48.0)
MCH: 32.5 pg (ref 26.0–34.0)
MCV: 95 fL (ref 81–101)
MONO#: 0.5 10*3/uL (ref 0.1–0.9)
MONO%: 6.9 % (ref 0.0–13.0)
Platelets: 134 10*3/uL — ABNORMAL LOW (ref 145–400)
RBC: 3.85 10*6/uL (ref 3.70–5.32)
WBC: 7.5 10*3/uL (ref 3.9–10.0)

## 2010-11-05 LAB — KAPPA/LAMBDA LIGHT CHAINS

## 2010-11-05 LAB — SPEP & IFE WITH QIG
Albumin ELP: 60.5 % (ref 55.8–66.1)
Alpha-1-Globulin: 5.5 % — ABNORMAL HIGH (ref 2.9–4.9)
IgA: 137 mg/dL (ref 68–378)
IgM, Serum: <4 mg/dL — ABNORMAL LOW (ref 60–263)
Total Protein, Serum Electrophoresis: 6.1 g/dL (ref 6.0–8.3)

## 2010-11-05 LAB — COMPREHENSIVE METABOLIC PANEL
AST: 14 U/L (ref 0–37)
Alkaline Phosphatase: 102 U/L (ref 39–117)
BUN: 20 mg/dL (ref 6–23)
Creatinine, Ser: 1.14 mg/dL (ref 0.40–1.20)

## 2010-11-05 LAB — RETICULOCYTES (CHCC)
RBC.: 3.91 MIL/uL (ref 3.87–5.11)
Retic Ct Pct: 2 % (ref 0.4–3.1)

## 2010-11-05 LAB — LACTATE DEHYDROGENASE: LDH: 99 U/L (ref 94–250)

## 2010-11-16 NOTE — Discharge Summary (Signed)
[name], [name]               ACCOUNT NO.:  [id number]   MEDICAL RECORD NO.:  [id number]          PATIENT TYPE:  INP   LOCATION:  6737                         FACILITY:  [facility]   PHYSICIAN:  Lonia Blood, M.D.       DATE OF BIRTH:  1951/01/21   DATE OF ADMISSION:  03/25/2007  DATE OF DISCHARGE:                               DISCHARGE SUMMARY   ADDENDUM TO THE DISCHARGE SUMMARY:  For a complete list of discharge  diagnoses, please refer to the previously-dictated discharge summary  done by Dr. Brien Few.  To those diagnoses, add:   1. Membranoproliferative glomerulonephritis.  2. Systemic lupus erythematosus.  3. Bilateral pulmonary infiltrates, unclear etiology.  4. Anasarca.   Discharge medications and disposition will be dictated at time of the  actual discharge of the patient.   For a complete list of procedures, refer to the previously-dictated  discharge summary.  To those procedures, please add :   1. April 13, 2007, CT chest without intravenous contrast.  Findings      of left and right small pleural effusions, right and left axillary      lymphadenopathy, left greater than right peribronchial vascular      opacities, right middle lobe opacity, atelectasis versus      infiltrates.  No chronic interstitial lung disease.  2. April 12, 2007, ultrasound-guided renal biopsy.  3. April 16, 2007, ultrasound-guided placement of a right      intrajugular Diatek catheter by Dr. Edilia Bo.  4. October 13 and April 17, 2007, hemodialysis.   CONSULTATIONS:  The patient has continued to be seen by Dr. Myna Hidalgo from  hematology, the nephrology service, as well as Dr. Molli Knock from  pulmonary.   For hospital course that is covering the period from March 26, 2007,  until April 10, 2007, refer to the previously-dictated discharge  summaries done by Dr. Angelena Sole and Dr. Brien Few.   HOSPITAL COURSE FROM OCTOBER 7-April 17, 2007:  During this period of  time [name] has continued to do  poorly.  The patient has accumulated  more fluid and her anasarca has worsened.  She also has started to  develop nausea and she had very poor appetite and metallic taste into  her mouth.  For this reason on April 15, 2007, the nephrologist on  call made a decision to initiate hemodialysis.  On April 16, 2007, the  patient had a right intrajugular dialysis catheter placed and she was  initiated on hemodialysis.  The renal biopsy which was obtained on  April 12, 2007, showed the presence of membranoproliferative  glomerulonephritis, most likely secondary to the patient's systemic  lupus erythematosus rather than her lymphoma.  During this period of  time we have undertaken an evaluation of the patient's bilateral  pulmonary infiltrates.  The consensus at this point in time is that the  infiltrates are not related to the lymphoma or the lupus and they are  were more likely related to atelectasis/volume overload.  During this  period of time we have discontinued the patient's antibiotic.  On  April 11, 2007, [name] was  also initiated on an antidepressant in the  form of Remeron at 15 mg at bedtime.  This was done also with the  purpose of maybe enhancing the patient's appetite so she can recuperate  her protein-caloric  malnutrition.  On April 16, 2007, Dr. Caryn Section and Dr. Myna Hidalgo have had a  conference about the patient.  Their consensus is to treat the patient  with high of doses intravenous steroids and intravenous Cytoxan in an  effort to control the glomerulonephritis and the lymphoma.      Lonia Blood, M.D.  Electronically Signed     SL/MEDQ  D:  04/17/2007  T:  04/18/2007  Job:  [id number]   cc:   Georgianne Fick, M.D.  Areatha Keas, M.D.  Rose Phi. Myna Hidalgo, M.D.

## 2010-11-16 NOTE — H&P (Signed)
[name], [name]               ACCOUNT NO.:  [id number]   MEDICAL RECORD NO.:  [id number]          PATIENT TYPE:  INP   LOCATION:  5033                         FACILITY:  [facility]   PHYSICIAN:  Della Goo, M.D. DATE OF BIRTH:  24-Dec-1950   DATE OF ADMISSION:  03/25/2007  DATE OF DISCHARGE:                              HISTORY & PHYSICAL   PRIMARY CARE PHYSICIAN:  Dr. Phylliss Bob .   CHIEF COMPLAINT:  Nausea, vomiting and abdominal pain.   HISTORY OF PRESENT ILLNESS:  This is a 60 year old female who returned  to the emergency department secondary to continued nausea, vomiting and  epigastric abdominal pain.  She reports having nausea and vomiting  symptoms for 5 days and having epigastric abdominal pain which also  radiated into the left lower quadrant area.  She reports being seen 3  days ago in the emergency department and been diagnosed with a urinary  tract infection, was placed on ciprofloxacin, however, had an allergic  reaction of hives and the medication was subsequently changed to  Macrobid.  She denies having any further back pain, however, does report  having abdominal pain now.  She denies having any hematemesis, melena or  hematochezia.  She denies seeing any hematuria.   PAST MEDICAL HISTORY:  1. Lupus.  2. Sjogren's.  3. Rheumatoid arthritis.  4. Asthma.   MEDICATIONS AT THIS TIME:  1. Maxzide 75/50 one p.o. daily.  2. Plaquenil 200 mg two p.o. daily.  3. Prednisone 10 mg one p.o. daily.  4. Potassium chloride 20 mEq one p.o. daily.  5. Nexium 40 mg one p.o. daily.  6. She is also on hormone replacement therapy.   ALLERGIES:  CIPRO and ERYTHROMYCIN, which cause hives.   SOCIAL HISTORY:  The patient is retired.  She is a nonsmoker and  nondrinker.   FAMILY HISTORY:  Positive for hypertension in a sister.  Her father died  of a ruptured brain aneurysm.  Positive cancer in her family, a paternal  aunt with breast cancer.  No coronary artery disease in her  family of  which she knows.   REVIEW OF SYSTEMS:  Pertinents are mentioned above.   PHYSICAL EXAMINATION:  GENERAL:  This is a 60 year old obese female in  discomfort, but no acute distress.  VITAL SIGNS:  Temperature 99.2, blood pressure 180/84, heart rate 86,  respirations 16, O2 SATs 100% on room air.  HEENT:  Normocephalic,  atraumatic.  Pupils are equally round and reactive to light.  Extraocular muscles are intact.  Funduscopic benign.  Oropharynx is  clear.  NECK:  Supple.  Full range of motion.  No thyromegaly, adenopathy or  jugulovenous distention.  CARDIOVASCULAR:  Regular rate and rhythm.  No murmurs, gallops or rubs.  LUNGS:  Clear to auscultation bilaterally.  ABDOMEN:  Positive bowel sounds, soft, nontender and nondistended.  There is no hepatosplenomegaly.  There is no rebound nor guarding.  EXTREMITIES:  Without cyanosis, clubbing or edema.  BACK:  There is no costovertebral angle tenderness and there is no  paravertebral or paraspinous process tenderness.  NEUROLOGIC:  Alert and  oriented x3.  Nonfocal.   LABORATORY STUDIES:  White blood cell count 9.4, hemoglobin 12.8,  hematocrit 38.2, MCV 83.2, platelets 55,000, neutrophils 88%,  lymphocytes 6%.  Sodium 138, potassium 3.4, chloride 103, bicarb 25.7,  BUN 12, glucose 102.  Urinalysis:  Large hemoglobin, small bilirubin,  negative nitrites and small leukocyte esterase.  Microscopic urine  reveals a few yeast.  Liver function tests reveal an albumin of 2.8, AST  61, ALT 35, alkaline phosphatase 102, total bilirubin 1.9, direct  bilirubin 0.5, indirect bilirubin 1.4.   The patient did have a renal ultrasound performed which was negative for  hydronephrosis, but positive for bilateral parapelvic questions cysts.   ASSESSMENT:  This is a 60 year old female being admitted with:  1. Nausea and vomiting.  2. Abdominal pain.  3. Back pain.  4. Urinary tract infection.  5. Thrombocytopenia.  6. History of Sjogren's,  lupus and rheumatoid arthritis.   PLAN:  The patient will be placed on IV fluids and will have potassium  replacement for her hypokalemia.  Pain control therapy has been ordered  along with nausea control therapy.  A CT scan of the abdomen and pelvis  has been ordered.  The patient has been placed on GI prophylaxis and TED  hose have been ordered for DVT prophylaxis at this time.  A repeat CBC  will be placed secondary to patient's thrombocytopenia for verification.      Della Goo, M.D.  Electronically Signed     HJ/MEDQ  D:  03/26/2007  T:  03/27/2007  Job:  [id number]   cc:   Areatha Keas, M.D.

## 2010-11-16 NOTE — Op Note (Signed)
NAMEFERNANDE, [name]               ACCOUNT NO.:  [id number]   MEDICAL RECORD NO.:  [id number]          PATIENT TYPE:  OIB   LOCATION:  3172                         FACILITY:  [facility]   PHYSICIAN:  Danae Orleans. Venetia Maxon, M.D.  DATE OF BIRTH:  10-31-1950   DATE OF PROCEDURE:  11/17/2006  DATE OF DISCHARGE:                               OPERATIVE REPORT   PREOPERATIVE DIAGNOSES:  1. 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  2. Cervical stenosis.  3. Spondylosis myelopathy.  4. Lupus erythematosus.   POSTOPERATIVE DIAGNOSES:  1. 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  2. Cervical stenosis.  3. Spondylosis myelopathy.  4. Lupus erythematosus.   PROCEDURE:  Intracervical decompression and fusion C5-6 with PEEK  interbody cage, morcellized upon autograft and intracervical plate.   SURGEON:  Dr. Venetia Maxon   ASSISTANTS:  1. Dr. Phoebe Perch  2. Georgiann Cocker, RN   ANESTHESIA:  General endotracheal.   ESTIMATED BLOOD LOSS:  Minimal.   COMPLICATIONS:  None.   DISPOSITION:  Recovery.   INDICATIONS:  Jermaine Tholl is a 60 year old woman with lupus from  chronic prednisone who has developed a significant cervical myelopathy  who has had an MRI of her cervical spine which shows increased cord  signal and severe spinal cord compression at C5-6 due to a disk  herniation.  It was elected to take her to surgery for anterior cervical  decompression and fusion at this affected level.   PROCEDURE:  Ms. Tugman was brought to the operating room.  Following the  satisfactory and uncomplicated induction of general endotracheal  anesthesia, she was placed in a supine position on the operating room  table.  Her neck was placed in slight extension.  She was placed in 10  pounds of Holter traction.  Her anterior neck was then prepped and  draped in the usual sterile fashion.  The area of planned incision was  infiltrated with 0.25% Marcaine 1/2% lidocaine 1:200,000 epinephrine.  Incision was made from  the midline to the anterior border of the  sternocleidomastoid muscle.  Sharply through platysmal layers,  subplatysmal dissection was performed exposing the anterior border of  the sternocleidomastoid muscle using blunt dissection.  The carotid  sheath was taped lateral and trachea and esophagus kept medial exposing  the anterior cervical spine.  The large degenerated disk had a large  spur.  It was felt to be the C5-6 level was identified, and a spinal  needle was placed.  An intraoperative x-ray demonstrated spinal needle  at C5-6 level.  Subsequently, the longus colli muscles were taken down  from the anterior cervical spine from C5 to C6 bilaterally using  electrocautery and key elevator, and a large ventral osteophyte was  removed and saved for later, used with bone graft.  Subsequently, the  interspace was incised after a Shadow line retractor was placed,  interspaces incised, and disk material was removed in piecemeal fashion.  The endplates are stripped of residual disk material.  A disk space  spreader was placed at the depths of the interspace.  Large fragment of  disk material was removed from the midline.  The endplates were then  decorticated using a high-speed drill and bone removed for safe later  use of bone graft.  Subsequently, a microscope was brought onto the  field, and a microhook was inserted between the disrupted posterior  longitudinal ligament and multiple large fragments of disk material were  removed which were compressing the spinal cord.  Subsequently, the  posterior longitudinal ligament was removed along with uncinate spurs,  and the spinal cord dura was identified and decompressed.  The C5 level  was undercut cephalad in the caudad along the C6 level to remove  redundant ligamentous tissue, and both foramen were widely decompressed.  The spinal cord dura appeared to be under much less pressure, and  hemostasis was assured with Gelfoam soaked in thrombin  and after trial  sizing, an 8 mm small PEEK interbody cage was selected, packed with  morcellated bone autograft and inserted in the interspace and  countersunk appropriately.  A 16 mm Trestle anterior cervical plate was  then affixed to the anterior cervical spine after removing traction  weight.  A fixed angle 14 mm screws, 2 at C5, 2 at C6.  All screws had  excellent purchase.  Locking mechanisms were engaged.  Final x-ray  demonstrated well-positioned interbody graft and anterior cervical  plate.  The wound was irrigated.  Soft tissues were inspected and found  to be in good repair.  Hemostasis was assured.  Subsequently, the  platysmal area was closed with 3-0 Vicryl sutures, and the skin edges  were reapproximated with 3-0 Vicryl interrupted inverted sutures.  The  wound was dressed with Dermabond.  The patient was extubated in the  operating room and taken to the recovery room in stable and satisfactory  condition having tolerated the operation well.  Counts were correct at  the end of the case.      Danae Orleans. Venetia Maxon, M.D.  Electronically Signed     JDS/MEDQ  D:  11/17/2006  T:  11/17/2006  Job:  [id number]

## 2010-11-16 NOTE — Discharge Summary (Signed)
[name], GRANT               ACCOUNT NO.:  [id number]   MEDICAL RECORD NO.:  [id number]          PATIENT TYPE:  INP   LOCATION:  6737                         FACILITY:  [facility]   PHYSICIAN:  Isidor Holts, M.D.  DATE OF BIRTH:  May 19, 1951   DATE OF ADMISSION:  03/25/2007  DATE OF DISCHARGE:                               DISCHARGE SUMMARY   ADDENDUM:   DISCHARGE DIAGNOSES:  1. Low-grade B-cell Non-Hodgkin's lymphoma.  2. Bicytopenia.  3. Renal failure, secondary to contrast nephropathy versus neoplastic      disease.  4. Acute peritonitis.  5. Pneumonia.  6. Hypertension.  7. Fluid overload.  8. Right eye subconjunctival hemorrhage.  9. Bronchial asthma.  10.Vulvovaginal candidiasis.  11.History of rheumatoid arthritis/SIV/Sjogren syndrome.   DISCHARGE MEDICATIONS:  Those will be listed in addendum with the  appropriate time, by discharging physician.   PROCEDURES:  Refer to interim summary dated April 03, 2007 by Dr.  Hannah Beat.  In addition:  1. Chest x-ray dated April 05, 2007.  This showed bibasilar      infiltrates and atelectasis.  2. Chest x-ray dated April 05, 2007.  This showed PICC line tip in      the SVC.  3. Ventilation perfusion lung scan dated April 06, 2007.  This showed      low probability for presence of pulmonary embolism.  4. Bilateral lower extremity venous Doppler dated April 06, 2007.      This showed no evidence of deep venous thrombosis or Baker cyst.  5. Chest x-ray dated April 09, 2007.  This showed persistent hilar,      and lower lobe atelectasis and air space disease.   CONSULTATIONS:  1. Arlan Organ, M.D., hematology oncology.  2. Casimiro Needle, M.D., nephrologist.  3. Donato Schultz, M.D., cardiologist.  4. Verne Carrow, M.D., ophthalmologist.   For admission history and detailed clinical course, refer to above-  mentioned interim summary dictated by Dr. Hannah Beat on April 03, 2007.  However, for the period from  April 05, 2007 to April 10, 2007  the following are pertinent.   The patient developed chest pain on April 05, 2007 at 8 a.m. which  appeared to have a pleuritic component, raising the suspicion of acute  pericarditis versus pulmonary thromboembolic disease.  EKG was  unremarkable for acute ischemic changes.  Cardiology consultation was  called, and was kindly provided by Dr. Anne Fu who recommended a D-dimer,  did a 2-D echocardiogram and commenced the patient on intravenous  Nitroglycerin infusion.  Two-dimensional echocardiogram was carried out  on April 05, 2007 and showed overall, normal left ventricular systolic  function, EF 60%.  No left ventricle regional wall motion abnormalities.  Although there were features consistent with pseudonormal  left  ventricular filling pattern with concomitant  abnormal relaxation and  increased filling pressure.  There was mild mitral valve regurgitation.  The left atrium was mildly dilated.  There was a small pericardial  effusion posterior to the heart which effusion dimensions were 9 mm  posterior, 7 mm inferior.  D-dimer subsequently returned markedly  elevated at 8.32.  Because this patient appeared to have multiple  predispositions to venous thrombolic  disease including underlying  malignancies, obese body habitus, sedentary condition as well as  rheumatologic disease including SLE, it was felt that pretest  probability of pulmonary embolism was very high.  She was commenced on  intravenous Heparin infusion.  Intravenous Nitrate was discontinued.  Ventilation perfusion lung scan was carried out, which was low  probability for PE.  Bilateral lower extremity Doppler studies were also  negative.  Heparin was therefore, discontinued and it was deemed that  the patient's chest pain was likely secondary to acute pericarditis,  against the background of SLE.  This responded to simple analgesics.  The patient was treated with a course of  antibiotic therapy for presumed  pneumonia based on chest imaging studies initially with Zosyn, which was  changed after 6 days of therapy on April 09, 2007 to Avelox which is  anticipated to be completed on April 13, 2007. Clinically, she had no  pyrexia, was not short of breath and white cell count as of April 10, 2007 was practically normal at 10.7.   The patient's renal failure continues to be problematic.  There has been  a steady increase in serum creatinine levels to 3.48 on April 10, 2007.  She continues to be under the watchful eye of the nephrologist.  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 Lowell Guitar has discussed this  finding with Dr. Arlan Organ and the suspicion is that this may be  some how associated with the patient's low-grade B-cell Non-Hodgkin's  lymphoma, maybe even on the basis of amyloidosis.  It appears that  definitive diagnosis will require a biopsy and at the time of this  dictation, this has been scheduled for April 12, 2007.  The patient's  anemia was addressed with iron transfusion with satisfactory bump in  hemoglobin.  As of April 10, 2007, hemoglobin was 10.0.  The patient  has been continued on Aranesp on the recommendations of  hematologist/oncologist.  More concerning is thrombocytopenia.  The  patient had initial platelet counts in the 40s; and this, following  intravenous transfusion of immunoglobulin, had improved to an all time  high of 270 on April 09, 2007.  However, by April 10, 2007, platelet  count had dropped to about 88, raising the suspicion of possible HIT.  Lovenox has been discontinued.  HIT screen is being carried out.  Fluid  overload has been addressed with intravenous Lasix with some success.  Bronchial asthma remains asymptomatic.  The patient developed  subconjunctival hemorrhage on April 09, 2007.  Dr. Verne Carrow,  ophthalmologist was  requested to evaluate.  He has done so; does not  advocate any specific treatment for now other than artifical tears.  Hypertension has been addressed with combination of Lasix, calcium  channel blocker and beta blocker.  As of April 10, 2007, blood pressure  had improved, although was still suboptimal at 147/75.  Further  adjustment in antihypertensive medication is anticipated.  On April 07, 2007, the patient complained of dysuria as well as pruritus.  The  patient, on examination revealed a cheesy whitish discharge consistent  with vulvovaginal candidiasis as well as a small right major labial  painful ulcer.  She was treated with a single dose of oral Diflucan and  commenced on Gyne-Lotrimin suppository, a 7-day course, with  considerable amelioration of symptoms.   DISPOSITION:  This will be elucidated  in detail in addendum at the  appropriate time by discharge physician.  However, the patient's  prognosis at this point was remained guarded, particularly review of  worsening renal function and underlying neoplastic disease.      Isidor Holts, M.D.  Electronically Signed     CO/MEDQ  D:  04/10/2007  T:  04/10/2007  Job:  [id number]   cc:   W.T. Orson Ape, M.D.

## 2010-11-16 NOTE — H&P (Signed)
NAMEJAILIN, MANOCCHIO               ACCOUNT NO.:  [id number]   MEDICAL RECORD NO.:  [id number]          PATIENT TYPE:  INP   LOCATION:  2926                         FACILITY:  [facility]   PHYSICIAN:  Della Goo, M.D. DATE OF BIRTH:  07/29/1950   DATE OF ADMISSION:  03/25/2007  DATE OF DISCHARGE:  03/25/2007                              HISTORY & PHYSICAL   PRIMARY CARE PHYSICIAN:  Areatha Keas, M.D.   CHIEF COMPLAINT:  Nausea, vomiting, abdominal pain.   HISTORY OF PRESENT ILLNESS:  This is a 60 year old female who returned  to the emergency department, secondary to continued nausea, vomiting and  epigastric abdominal pain.  The patient also reports having back pain.  She denies having fevers, chills.  She reports having nausea and  vomiting for 5 days.   Dictation ended at this point.      Della Goo, M.D.  Electronically Signed     HJ/MEDQ  D:  03/26/2007  T:  03/27/2007  Job:  [id number]   cc:   Areatha Keas, M.D.

## 2010-11-16 NOTE — Consult Note (Signed)
NAMELANA, FLAIM               ACCOUNT NO.:  [id number]   MEDICAL RECORD NO.:  [id number]          PATIENT TYPE:  INP   LOCATION:  5033                         FACILITY:  [facility]   PHYSICIAN:  Rose Phi. Myna Hidalgo, M.D. DATE OF BIRTH:  1950-11-19   DATE OF CONSULTATION:  03/26/2007  DATE OF DISCHARGE:                                 CONSULTATION   REFERRING PHYSICIAN:  Dr. Tamsen Roers .   REASON FOR CONSULTATION:  Thrombocytopenia.   HISTORY OF PRESENT ILLNESS:  Ms. Melkonian is a very nice, 60 year old black  female who is obese.  She has a longstanding history of lupus.  She has  had lupus for 27 years.  She is followed by Dr. Estill Bakes.  She  apparently also has history of Sjogren's and rheumatoid arthritis.   She is followed by Dr. Phylliss Bob as stated previously.   She has had surgery in the recent past.  She had neck surgery by Dr.  Venetia Maxon back in May.  At that time, she had undergone disc surgery at C[facility]-  [facility].   She subsequently has come in with progressive nausea and vomiting.  She  has abdominal pain.  She has had some hematuria.   She on admission was found to have platelet count of 64,000.  Hemoglobin  12.8 and white cell count was 10.6.   On September 20, her platelet count was 60,000.   Going back to March of this past year when she was admitted, she was  found to have a platelet count of 221.  This was when she was admitted  with what appeared to be E. coli pyelonephritis.  At that time, she was  on Plaquenil, prednisone, Nexium, aspirin, Maxzide.   She just felt worse.  She could not really give too much more in the way  of history.  She does have some bruising.  Again, she does have some  hematuria.  There has been no bleeding otherwise.   She underwent a CT scan of the abdomen and pelvis.  This was done at  admission.  This showed enlarged abdominal nodes.  She had  a node at  the iliac bifurcation on the right measuring 3.2 x 1.9 cm.  A portocaval  node measured 3.8 x 1.5  cm.  These were enlarged from the last scan.  A  CT of the chest done on March 22, 2007 basically shows a right lower  lobe nodule that is new.  This is 9 mm in size.  This is of questionable  significance.  She has bilateral axillary adenopathy which again is  stable.  The largest node measured in the right axilla measures 3.2 x  2.4 cm.  There is no evidence of pulmonary emboli.   We were subsequently asked to see her, as her platelet count has gone  down.  Today, her platelet count was 37,000.  White cell count was 9,  hemoglobin 11.4.   She had urine culture done which was no growth preliminary wise.  TSH  was normal.   We were subsequently asked to evaluate her.   PAST MEDICAL  HISTORY:  Remarkable for:  1. Lupus.  2. Sjogren's.  3. Rheumatoid arthritis.  4. Cervical stenosis, status post decompression.  5. Cholecystectomy.   ALLERGIES:  ARE ERYTHROMYCIN AND CIPRO.   MEDICATIONS:  Are:  1. Plaquenil 400 mg every day.  2. Protonix 40 mg q.12 hours.  3. K-Dur 20 mEq every day.  4. Maxzide one p.o. every day.  5. Prednisone 5-10 mg p.o. every day.   SOCIAL HISTORY:  Negative for tobacco use.  There is no alcohol use.  She actually gave up smoking 17 years ago.  She is retired.   FAMILY HISTORY:  Noncontributory.  There is history of asthma and  hypertension.   REVIEW OF SYSTEMS:  Negative for fevers, sweats or chills.  There are no  swallowing difficulties.  She has no headache or blurred vision.  She  does have some epigastric discomfort.  There is slight dyspepsia.  She  has had no diarrhea.  There has been no melena or bright red blood per  rectum.  She has had no rashes.  There has been no petechia.  She has  not noticed any swelling in her legs.  She feels tired.  She has back  pain when she walks.   PHYSICAL EXAMINATION:  GENERAL:  This is an obese black female in no  obvious distress.  VITAL SIGNS:  Temperature 98.6, pulse 98, respiratory rate 20, blood   pressure 138/71.  Her weight is 91 kilograms, height is 67 inches.  HEAD/NECK EXAM:  Shows normocephalic, atraumatic skull.  I do not detect  any cervical or supraclavicular lymph nodes.  She has no oral lesions.  There is no scleral icterus.  LUNGS:  Decreased breath sounds at the bases.  CARDIAC EXAM:  Regular rate and rhythm with normal S1 and S2.  There are  no murmurs, thrills or bruits.  ABDOMINAL EXAM:  Soft with good bowel sounds.  She is obese.  There is  some slight tenderness in the epigastric region.  There is no palpable  hepatosplenomegaly.  There is no inguinal adenopathy bilaterally.  AXILLARY EXAM:  Shows no obvious bilateral axillary adenopathy.  BACK EXAM:  No tenderness of the spine.  EXTREMITIES:  Shows no clubbing, cyanosis or edema.  She does have some  ecchymoses, mostly in her right arm.  NEUROLOGIC EXAM:  Shows no focal neurological deficits.   LABORATORY STUDIES:  White blood cell count 9, hemoglobin 11.4,  hematocrit 33.9, platelet count 37,000.   Sodium is 136, potassium 3.1, BUN 9, creatinine 1, total bilirubin 1.2,  calcium 7.8, total protein 5.5 with albumin of 2.8.   Peripheral smear shows a normochromic __________ of her red blood cells.  I do not see any nucleated red blood cells.  There are no teardrop  cells.  I see no schistocytes.  I see no target cells.  White cells  appear normal in morphology and maturation.  I do not see any smudged  cells.  There are no immature lymphoid or myeloid cells.  I see no  blasts.  Platelets were decreased in number.  Platelets typically are a  little bit larger in size, but normal.  Platelets are well granulated.   IMPRESSION:  Ms. Schaben is a 60 year old black female with progressive  thrombocytopenia.  Just back in May, platelet count was 231.  This has  subsequently dropped.   This certainly could be autoimmune.  This certainly could be a marrow  infiltration.   I do not believe this  is medication related.   She has been on Plaquenil  for awhile.  She really has had no new medications ordered.   Her blood smear certainly does not give away any obvious hint as to the  problem.  I do not see anything that would suggest myeloma.  I certainly  do not see anything that would suggest leukemia or myelodysplasia.  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   We certainly may have to end up having to do a bone marrow test on her  to get a diagnosis.   I am a little bit worried about these lymph nodes.  They clearly do  appear to be pathologically enlarged.  It is certainly possible that she  may have marrow involvement by whatever is affecting the lymph nodes.   I think she is going to need to have biopsies done.  I believe this  going to need to be done laparoscopically or by an open procedure.  We  certainly could give platelets if we need to for any surgical procedure.   I do not have confidence in doing tiny needle aspirations or core  biopsies.  I think we would not get enough tissue that way.  If we are  dealing with lymphoma, it is certainly a consideration given her  longstanding autoimmune issues, then we definitely need as much tissue  as possible sent off for appropriate studies.   I would certainly see about getting Dr. Carolynne Edouard involved.  He did her  gallbladder surgery and so he certainly could determine what kind of  approach needs to be taken if we have too.   I think she also needs to have an MRI done of the lower thoracic and  lumbosacral spine.  It is possible she may have a compression fracture  as the cause of her pain.  Again, because she has been on steroids for  quite a long time.  There is a possibility that she may have a  pathologic fracture.   We will certainly follow Ms. Obremski along.  I certainly do not see any  need to intervene right now with transfusions.  I think if she does  need  to have a biopsy done, then certainly she would need to be transfused.   We certainly could go with IVIG or possibility [facility] to try to help  with her platelets if need be.   I assume we will keep her off aspirin for right now.   I had a nice talk with Ms. Bjorklund and her husband.  They are both very  nice.  I feel bad for her.  We had a good fellowship and prayed.      Rose Phi. Myna Hidalgo, M.D.  Electronically Signed     PRE/MEDQ  D:  03/26/2007  T:  03/27/2007  Job:  [id number]   cc:   Beckey Rutter, MD  Areatha Keas, M.D.  Ollen Gross. Vernell Morgans, M.D.  Danae Orleans. Venetia Maxon, M.D.

## 2010-11-16 NOTE — Op Note (Signed)
[name], [name]               ACCOUNT NO.:  [id number]   MEDICAL RECORD NO.:  [id number]          PATIENT TYPE:  INP   LOCATION:  6737                         FACILITY:  [facility]   PHYSICIAN:  Di Kindle. Edilia Bo, M.D.DATE OF BIRTH:  07/11/1950   DATE OF PROCEDURE:  04/16/2007  DATE OF DISCHARGE:                               OPERATIVE REPORT   PREOPERATIVE DIAGNOSIS:  Chronic renal failure.   POSTOPERATIVE DIAGNOSIS:  Chronic renal failure.   PROCEDURE:  Ultrasound-guided placement of a right IJ Diatek catheter.   SURGEON:  Dr. Edilia Bo.   ANESTHESIA:  Local with sedation.   TECHNIQUE:  The patient was taken to the operating room, sedated by  anesthesia.  The ultrasound scanner was used to identify both IJs which  appeared to be patent. The neck and upper chest were prepped and draped  in the usual sterile fashion.  After the skin was anesthetized with 1%  lidocaine and ultrasound guidance, the right IJ was cannulated and a  guidewire introduced into the superior vena cava under fluoroscopic  control.  The tract over the wire was dilated and then a dilator and  peel-away sheath were advanced over the wire and the wire and dilator  removed.  The catheter was passed through the peel-away sheath and  positioned in the right atrium.  The exit site for the catheter was  selected and the skin anesthetized between the two areas.  The catheter  was then brought through the tunnel, cut to the appropriate length and  the distal ports were attached.  Both ports withdrew easily.  They were  then flushed with heparinized saline and filled with concentrated  heparin.  The catheter was secured at its exit site with a 3-0 nylon  suture.  The IJ cannulation site was closed with a 4-0 subcuticular  stitch.  A sterile dressing was applied.  The patient tolerated the  procedure well and was transferred to the recovery room in satisfactory  condition.  All needle and sponge counts were  correct.      Di Kindle. Edilia Bo, M.D.  Electronically Signed     CSD/MEDQ  D:  04/16/2007  T:  04/16/2007  Job:  [id number]

## 2010-11-16 NOTE — Consult Note (Signed)
Erica, [name]               ACCOUNT NO.:  [id number]   MEDICAL RECORD NO.:  [id number]          PATIENT TYPE:  INP   LOCATION:  6737                         FACILITY:  [facility]   PHYSICIAN:  Ardeth Sportsman, MD     DATE OF BIRTH:  08/22/1950   DATE OF CONSULTATION:  03/28/2007  DATE OF DISCHARGE:  04/25/2007                                 CONSULTATION   RE-DICTATION   REQUESTING PHYSICIAN/MEDICAL ONCOLOGIST:  Dr. Myna Hidalgo   REASON FOR CONSULTATION:  Lymph node biopsy for thrombocytopenia.   HISTORY OF PRESENT ILLNESS:  This is a 60 year old, morbidly obese  female who has known history of lupus, rheumatoid arthritis and question  of Sjogren disease.  She has been followed by Dr. Estill Bakes with  rheumatology for this.  She was admitted recently for progressive nausea  and vomiting on March 26, 2007.  She underwent evaluation and was  found to have thrombocytopenia and worsening lymphadenopathy.  Surgical  consultation was made for lymph node biopsy for diagnosis for possible  lymphoma versus other differential diagnosis.  Because the newer & the  more suspicious lesions are in the retroperitoneal regions, surgical  consultation was made for retroperitoneal lymph node excisional biopsy.  Patient on admission was found to have a  platelet count of 64,000 and  this has decreased to around 49,000 today.  Dr. Myna Hidalgo with medical  oncology has evaluated her and because of concerns of lymphadenopathy  and her thrombocytopenia, surgical biopsy request has been asked.   PAST MEDICAL HISTORY:  1. Lupus.  2. Sjogren's syndrome.  3. Rheumatoid arthritis.  4. Cervical stenosis status post decompression.  5. Asthma.   PAST SURGICAL HISTORY:  1. Cholecystectomy.  2. Cervical decompression.   ALLERGIES:  ERYTHROMYCIN AND CIPRO.   MEDICATIONS AT HOME:  1. Plaquenil.  2. Protonix.  3. K-Dur.  4. Maxzide.  5. Prednisone.   SOCIAL HISTORY:  She is married in a stable  relationship.  She is  currently retired, I believe, secondary to being disabled.  She has a  distant history of tobacco abuse, but has not smoked in over 17 years,  no alcohol or other drug use.   FAMILY HISTORY:  Asthma and hypertension, but otherwise was negative.   REVIEW OF SYSTEMS:  As noted per HPI.  GENERAL:  No fevers, chills,  sweats or swallowing difficulties.  PSYCH:  She has had some depression,  but no major mood swings or dementia or delirium.  EYES:  No vision  changes or drip.  ENT:  No rhinorrhea or hearing changes.  NECK:  She  has some mild chronic neck pain, but nothing recent, no swallowing  difficulties.  CHEST:  Slight dyspnea, but it is relatively stable with  no major changes.  CARDIAC:  No exertional chest pain or diaphoresis.  GI:  She has had some nausea and vomiting, but no diarrhea, no melena or  hematochezia.  SKIN:  No petechiae or purpura, no other sores or  lesions.  EXTREMITIES:  No significant edema or clubbing.  MUSCULOSKELETAL:  She has had some chronic low back  pain as well as some  cervical pain, but nothing recent.  GYN:  No vaginal bleeding or  discharge.  HEME/LYMPH:  As noted per HPI.  NEUROLOGICAL:  No specific  areas of weakness.  BREASTS:  No nipple discharge or drainage.  RENAL/HEPATIC:  Otherwise negative.  ENDOCRINE:  She is on some  prednisone, but I believe her sugars have not been elevated with this,  otherwise negative.  RHEUMATOLOGICAL:  As noted above with rheumatoid  arthritis and lupus relatively stable on Plaquenil and prednisone.   VITAL SIGNS:  T-max of 101, current temperature 97.8, pulse initially  121, respirations 20, 93 sats on room air.  GENERAL:  She is a well-developed, morbidly obese female and anxious,  but no frank distress.  EYES:  Pupils equal, round and reactive to light.  Intact sclerae, not  icteric or injected.  ENT:  Nasopharynx and oropharynx clear.  She has no facial asymmetry.  No rhinorrhea.  NECK:   Supple.  No masses.  Trachea is midline.  LYMPH:  No head, neck, axillary or groin lymphadenopathy that is  markedly notable; I cannot really palpate anything.  There is some  question of some inguinal lymphadenopathy, but it is very hard to  discern given her obesity.  MUSCULOSKELETAL:  Full range of motion of shoulders, elbows, wrists as  well as hips, knees and ankles, although she is rather stiff in her back  and she does feel rather sore.  SKIN:  She has a moderate amount of bruising and ecchymosis, but no  petechiae or definite purpura.  PSYCH:  She is anxious, but consolable.  No evidence of dementia,  delirium, psychosis or paranoia.  LUNGS:  Clear to auscultation bilaterally.  No wheezes, rales or  rhonchi,  HEART:  Regular rate and rhythm.  No murmurs, clicks or rubs.  ABDOMEN:  She has some mild epigastric tenderness to palpation, but  certainly no peritonitis.  There is no evidence of any incisional or  umbilical hernia.  GENITAL:  Normal external female genitalia with no inguinal hernias.   LABORATORY VALUES:  Her platelet count is 49,000, hemoglobin 11.5.  Chemistries are in the normal range.  LDH is elevated at 357.   CT scan does show some lymphadenopathy, more significantly noted along  the iliac bifurcation on the right especially, largest one is 3.2 cm in  size with portocaval  node that is around 3.8 cm as well.  She does have  some patchy nodular opacifications in bilateral lung bases and slightly  increased from a prior CT on March 16.  There is some inguinal  lymphadenopathy, but it is not markedly changed.   ASSESSMENT/PLAN:  A 60 year old morbidly obese female with numerous  autoimmune disorders including lupus, Sjogren, rheumatoid arthritis with  sudden thrombocytopenia of uncertain etiology and workup that is not  diagnostic and worsening and new retroperitoneal lymphadenopathy,  especially in the iliac region.   The function of the lymphatic system was  discussed.  Options discussed,  recommendation was made for a diagnostic laparoscopy with excisional  lymph node biopsies.  Given the workup for lymphoma, a percutaneous  needle biopsy usually is inadequate and Dr. Myna Hidalgo who is head of  oncology has requested this location since this is the area of sudden  increase in lymphadenopathy that correlates with her symptoms and her  thrombocytopenia.   Technique of laparoscopic approach versus possible need for conversion  to open or retroperitoneal approach was discussed.  Risks such as  stroke, heart  attack, deep vein thrombosis, pulmonary embolism or death  discussed.  Risks such as bleeding, need for transfusion, wound  infection, abscess, injury to other organs and other risks were  discussed.  Options were discussed and plan was made in discussion with  the patient as well as her husband.  Questions answered and she and her  husband agreed to proceed.  Will work to coordinate this at a convenient  time, hopefully as soon as [employer].      Ardeth Sportsman, MD  Electronically Signed     SCG/MEDQ  D:  05/27/2007  T:  05/27/2007  Job:  [id number]

## 2010-11-16 NOTE — Consult Note (Signed)
[name], [name]               ACCOUNT NO.:  [id number]   MEDICAL RECORD NO.:  [id number]          PATIENT TYPE:  INP   LOCATION:  3739                         FACILITY:  [facility]   PHYSICIAN:  Jake Bathe, MD      DATE OF BIRTH:  02/22/1951   DATE OF CONSULTATION:  DATE OF DISCHARGE:                                 CONSULTATION   REFERRING PHYSICIAN:  Dr. Brien Few,  Rheumatologist:  Dr. Phylliss Bob.  Oncologist  during this hospitalization:  Arlan Organ.  Nephrologist during this  hospitalization:  Elvis Coil.   REASON FOR CONSULTATION:  [name] is being seen at the request of Dr.  Brien Few for the evaluation of chest pain.   HISTORY OF PRESENT ILLNESS:  [name] is a very complicated 60 year old  African American female with prior history of autoimmune disorder, such  as, Sjogren's, lupus, rheumatoid arthritis, who was admitted on  March 25, 2007 with abdominal pain and nausea and was found to have  lymph nodes in her abdomen by CT scan.  These lymph nodes were then  biopsied laparoscopically and she was found to have Non-Hodgkin's  lymphoma.  In addition, she had developed contrast nephropathy with a  creatinine around 2.0 during this admission.  She is being seen by  nephrology.   Since this laparoscopic surgery, she has been quite sedentary but has  been vigilant with her DVT prophylaxis wearing SCDs.   This morning, she woke up with substernal chest pain described as an  intermittent pressure that was responsive to nitroglycerin.  This chest  pain was 6-7/10 at its worst intensity.  She described a similar pain  with prior history of chickenpox but this was more severe.  She also  described some shortness of breath since the onset of this chest pain.  There was also a pleuritic component to the pain in that it is worse  with deep inspiration and continues to be.  ECG demonstrated a normal  sinus rhythm, rate 95 with nonspecific ST-T-wave changes and normal  intervals.  When  compared to her prior ECG dated March 29, 2007,  there was no significant change other than a decrease in heart rate from  104.   In addition to her renal failure thought to be contrast-induced  nephropathy, she has a history of thrombocytopenia with platelet counts  as low as 60,000 now back in the 130s.  She is currently on aspirin.   She now feels chest pain-free on a low-dose nitroglycerin drip and has  received Lopressor 12.5 mg b.i.d. without difficulty.   PAST MEDICAL HISTORY:  1. Non-Hodgkin's lymphoma.  2. Acute renal failure, contrast, creatinine as high as 2.2.  3. Lupus.  4. Rheumatoid arthritis.  5. Sjogren's syndrome.  6. Thrombocytopenia.  7. Lower-extremity edema - maybe secondary to renal failure.  8. History of asthma.  9. History of hypertension.  10.Obesity.  11.Chronic prednisone use.   MEDICATIONS:  1. Prednisone 10 mg once a day.  2. Protonix 40 mg once a day.  3. Plaquenil 200 mg once a day.  4. Aspirin 81 mg.  5. Amlodipine 5 mg once a day.  6. Metoprolol 12.5 mg twice a day.  7. Various p.r.n. medications.  8. Nitroglycerin drip.   ALLERGIES:  CIPRO AND ERYTHROMYCIN.   FAMILY HISTORY:  No early family history of coronary artery disease.  Various cancers in her family.   SOCIAL HISTORY:  She is retired, nonsmoker, nondrinker, no illicit drug  use.   REVIEW OF SYSTEMS:  1. Back pain.  2. Edema.  3. Chest pain.  4. Shortness of breath.  5. Decreased appetite.  Unless mentioned above, all other 12 review of systems are negative.   PHYSICAL EXAMINATION:  VITAL SIGNS:  Temperature 98.2, pulse 81 to 95,  respirations 18, blood pressure 153/80, sating 95% on room air.  GENERAL:  Alert and oriented x3 in no acute distress.  In bed, sleepy,  status post morphine.  EYES:  Pale conjunctivae.  EOMI.  OROPHARYNX:  Moist mucous membranes.  NECK:  Thick.  No carotid bruits.  No JVD.  No thyromegaly.  CARDIOVASCULAR:  Regular rate and rhythm with  soft 1/6 systolic murmur  heard at the left upper sternal border.  LUNGS:  Clear to auscultation bilaterally.  Normal respiratory effort.  ABDOMEN:  Obese, multiple bandages at surgical wound sites, positive  bowel sounds.  Mild tenderness around surgical wounds.  EXTREMITIES:  1 to 2+ pitting edema pretibial bilateral lower  extremities.  There was no calf tenderness.  Negative Homan sign.  SKIN:  Warm, dry and intact.  NEUROLOGIC:  Nonfocal.  No tremors.  Groggy as described above.   DATA:  ECG demonstrates sinus rhythm with nonspecific ST-T-wave changes  with no significant change from prior.   CTA of chest done previously was personally reviewed and I did not see  any significant coronary calcification.   White count 9.2, hemoglobin 11.0, hematocrit 32.0, platelets now 116.  Creatinine 2.1, stable.  BUN 49.  LFTs are normal.  Cardiac enzymes:  First set normal.  Iron studies are low.   A urinalysis shows hematuria.   Chest x-ray shows borderline cardiomegaly with bilateral lower lobe  infiltrate versus alveolar edema.   Gastric emptying study showed mild delay.   MRI showed moderate amount of retroperitoneal pelvic lymphadenopathy as  documented by CT scan.   Renal ultrasound showed no hydronephrosis, bilateral cysts.   EKGs noted above were with chest pain.   ASSESSMENT/PLAN:  A 60 year old [place] American female with multiple  medical issues including autoimmune disorders, lupus, rheumatoid  arthritis, and Sjogren's syndrome with obesity, Non-Hodgkin's lymphoma,  thrombocytopenia, hypertension, and new-onset chest pain and shortness  of breath.  1. Chest pain/shortness of breath - the pain did respond to      nitroglycerin and morphine.  Currently on nitroglycerin drip and      pain-free.  Certainly, this could be a manifestation of coronary      artery disease and would consider this unstable angina until proven      otherwise.  I will check an echocardiogram to  assess her wall      motion.  Continue to cycle cardiac enzymes.  Check EKG with chest      pain and in morning.  Continue aspirin.  2. Also on the differential includes pulmonary embolism given her      sedentary status post surgery.  I will check a D-dimer which      unfortunately I will assume will be positive given her other      inflammatory states.  I will go  ahead and check lower-extremity      Doppler's to ensure that there is no evidence of deep vein      thromboses.  Lower-extremity edema has been present since renal      failure and is thought to be secondary to that.  If suspicion of      pulmonary embolism remains, may consider a VQ scan for further risk      stratification.  I would not perform a contrast study in her given      her renal failure.  However, things going against pulmonary      embolism include her normal oxygen saturation at 95% on room air as      well as heart rate actually decreased from admission.  3. Murmur - will check 2D echocardiogram.  4. Non-Hodgkin's lymphoma - status post lymph node biopsy, abdominal.      Currently followed by hematology.  5. Autoimmune diseases - on chronic prednisone and Plaquenil therapy.      Would have also a high suspicion for peptic ulcer disease given      chronic prednisone use.  Continue Protonix.  Autoimmune diseases,      such as, rheumatoid arthritis and lupus are a positive risk factor      for coronary artery disease.  6. Asthma - beta blocker already tolerated.  Continue.  7. We will follow up with echocardiogram.  The patient likely will      require further risk stratification with stress test in the future;      however, at this time, will check echo first and lower-extremity      Doppler's.  8. Thrombocytopenia - increasing platelet count.  9. Acute renal failure - creatinine 2, probably secondary to contrast-      induced nephropathy.  10.Possible pneumonia - started Zosyn today.   We will follow along  with you.  Thank you for this consultation.      Jake Bathe, MD  Electronically Signed     MCS/MEDQ  D:  04/05/2007  T:  04/05/2007  Job:  [id number]   cc:   Isidor Holts, M.D.  Areatha Keas, M.D.  Rose Phi. Myna Hidalgo, M.D.  Garnetta Buddy, M.D.

## 2010-11-16 NOTE — Consult Note (Signed)
NAMELEVORA, WERDEN               ACCOUNT NO.:  [id number]   MEDICAL RECORD NO.:  [id number]          PATIENT TYPE:  INP   LOCATION:  6737                         FACILITY:  [facility]   PHYSICIAN:  Felipa Evener, MD  DATE OF BIRTH:  1951-02-25   DATE OF CONSULTATION:  DATE OF DISCHARGE:                                 CONSULTATION   PATIENT IDENTIFICATION:  The patient is a 60 year old female with past  medical history significant for multiple autoimmune disorders presenting  to the pulmonary service today with a chief complaint of shortness of  breath.   HISTORY OF PRESENT ILLNESS:  The patient is a 60 year old female with  past medical history significant for Sjogren's disease with lupus  erythematosus that has been diagnosed approximately 37 years prior to  presentation.  Has been chronically on Plaquenil and 10 mg prednisone  for many years and reports that she has been feeling well until she  reports some abdominal pain as well as shortness of breath, presented to  [facility].  She was found to have lymphadenopathy, mediastinal  as well as abdominal.  Laparoscopic examination was done.  A biopsy was  done that revealed non-Hodgkin's lymphoma for which  the patient has not  been receiving any treatments beyond that.  She has had an extremely  complicated medical history including renal failure due to contrast  nephropathy as well as lupus, rheumatoid arthritis, Sjogren's syndrome,  thrombocytopenia of unknown  etiology, lower extremity edema, history of  asthma, systemic hypertension, just to mention a few.  With regards to  shortness of breath, the patient reported some shortness of breath that  started approximately 2 days prior to presentation to pulmonary service,  mostly on exertion, not accompanied by any cough, no sputum production,  no fever, chills, abdominal pain, or chest pain.  No significant  symptoms beyond the shortness of breath.  The only accompanying  symptom  has been pedal edema that has been increasing over the past few days.  Reported some bloody nasal discharge.  Denied any weight gain or weight  loss.  At this point, denied any fever, chills, nausea, vomiting,  abdominal pain, or chest pain.   PAST MEDICAL HISTORY:  Again is extremely complex includes:  1. Non-Hodgkin's lymphoma diagnosed recently.  2. Acute renal failure due to contrast with a creatinine as high as      2.2.  3. Lupus.  4. Rheumatoid arthritis.  5. Sjogren's syndrome.  6. Thrombocytopenia.  7. Lower extremity edema most likely secondary to renal failure.  8. History of asthma for approximately 25 years.  9. History of hypertension.  10.Obesity.  11.Chronic prednisone use as mentioned above.   MEDICATIONS:  At home include:  1. Prednisone.  2. Protonix.  3. Plaquenil.  4. Aspirin.  5. Amlodipine.  6. Metoprolol.  7. Nitroglycerin.   MEDICATIONS:  In the hospital are:  1. Sinemet.  2. Amlodipine.  3. Aspirin.  4. Clotrimazole.  5. Fentanyl.  6. Furosemide.  7. Hydroxychloroquine.  8. Iron dextran.  9. Metoclopramide.  10.Midazolam.  11.Mineral oil.  12.Remeron.  13.Morphine.  14.Avelox.  15.Protonix.  16.Senna.  17.Acetaminophen.  18.Hydromorphone.  19.Labetalol.  20.Albuterol.  21.Meperidine.  22.Nitroglycerin.  23.Zofran.  24.Phenergan.   ALLERGIES:  CIPRO AND AZITHROMYCIN REACTION.   FAMILY HISTORY:  The patient's father expired while she was a child due  to an aneurysm.  She apparently just learned that her mother is still  alive.  No other significant family history.   SOCIAL HISTORY:  She smoked 3/4 pack per day for approximately 10 years,  quit 20 years prior to presentation.  No alcohol or drug abuse.  No  significant occupational history.   REVIEW OF SYSTEMS:  A 12-point review of systems is negative other than  mentioned above.   PHYSICAL EXAMINATION:  GENERAL:  A well-appearing female resting  comfortably on exam  bed.  No acute distress.  VITAL SIGNS:  Temperature 98.6, T-max of 98.7, pulse is 71, respiratory  rate 20, blood pressure 124/66, saturation is 96% on room air.  HEENT:  Normocephalic, atraumatic.  Pupils equal, round, reactive to  light.  Extraocular movements are intact.  Oral and nasal mucosa are  within normal limits.  NECK:  No thyromegaly, no lymphadenopathy.  No jugular venous  distention.  HEART:  Regular rate and rhythm.  No murmurs, rubs or gallops  appreciated.  LUNGS:  Bibasilar crackles as well as rales at the left middle posterior  lung fields with no expiratory wheezes.  ABDOMEN:  Soft, nontender, nondistended.  Positive bowel sounds.  EXTREMITIES:  With 1+ edema bilaterally.  No tenderness.  NEURO:  Grossly intact.   LABORATORY DATA:  Significant for:  A white blood cell count of 13.8,  hemoglobin 10.2, hematocrit 30, and platelets of 140.  The differential  had 84% PMNs with no evidence of bandemia.  Renal function:  Sodium 138,  potassium 3.9, chloride of 107, CO2 of 19, glucose 88, BUN of 81, and  creatinine of 3.94, phosphorus of 5.8, calcium is 8.1, albumin is 2.3.  Heparin antibodies are negative and coagulation profile was within  normal limits.   Chest x-ray revealed bibasilar left more than right air space disease  with interstitial infiltrate as well as cardiomegaly and a full  mediastinum, otherwise appears to be normal.  An abdominal CT that  caught parts of the lungs reveal interstitial infiltrate pattern.   ASSESSMENT AND PLAN:  The patient is a 60 year old female with history  of systemic lupus erythematosus among many other diseases who presented  with shortness of breath.  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  The elevated white blood cell count  was related to the steroid use and other appointed differential to  consider would be interstitial  lung disease such as NSIP or UIP.  Therefore, at this point we recommend checking a high resolution CT and  a CT of the chest without contrast and will start desaturations in order  to evaluate whether  the patient will need oxygen at home or not.  PFTs  to be ordered to evaluate the perfusion compactly.  Depending on the  above result, there is interstitial infiltrate pattern.  We will decide  on performing a bronchoscopy and sending the fluid for an immune  compromise panel.  We will also place a PPD with controls on the patient  in case this becomes lupus pneumonitis and high dose steroids will be  needed.   Thank you for allowing [place]  to participate in the patient's care and we  will continue to follow with you.      Felipa Evener, MD  Electronically Signed     WJY/MEDQ  D:  04/12/2007  T:  04/13/2007  Job:  [id number]

## 2010-11-16 NOTE — Discharge Summary (Signed)
[name], [name]               ACCOUNT NO.:  [id number]   MEDICAL RECORD NO.:  [id number]          PATIENT TYPE:  INP   LOCATION:  6737                         FACILITY:  [facility]   PHYSICIAN:  [name], M.D.   DATE OF BIRTH:  [birth year]   DATE OF ADMISSION:  03/25/2007  DATE OF DISCHARGE:                               DISCHARGE SUMMARY   ADDENDUM:  This is an addendum to discharge summary done by Dr.  Isidor [name] and Dr. Lonia [name].   DISCHARGE MEDICATION LIST:  1. Plaquenil 200 mg daily.  2. Amlodipine 10 mg daily.  3. Aspirin 81 mg daily.  4. Remeron 15 mg q.h.s.  5. Renal formula vitamin daily, Nephro-Vite daily or multivitamin      daily.  6. Metoprolol 50 mg in the morning and 100 mg at bedtime.  7. Lasix 160 mg b.i.d.  8. Potassium chloride 40 mEq daily.  9. Prednisone taper:  50 mg with October 21 to October 23; 40 mg      October 24 to October 26; 30 mg October 27 to October 29; 20 mg per      day October 30; then follow up with renal for further adjustments.  10.Slow Fe daily.   FOLLOW-UP APPOINTMENTS:  The patient to follow up with Dr. Nicholos [name]  on April 17, 2007, at 1:30 and with Dr. Kathrene [name] October 28 at  1:15, and to have labs done at [facility] Friday after 8 a.m.   PROCEDURES:  The patient had a PICC line that was removed and also her  dialysis catheters were removed.  Her dialysis catheter was removed  April 23, 2007.   EVENTS BETWEEN OCTOBER 15-21:  Problem 1.  The patient had one dialysis treatment during that time  period.  Renal function continued to improve.  Her urine output was  adequate.  Her creatinine continued to decrease.  The day prior to  discharge, creatinine had decreased to 1.43.  The patient's dialysis  catheters were removed and she was continued on Lasix.  She will follow  up outpatient with Dr. Kathrene [name] of nephrology.   Problem 2.  HYPERTENSION:  The patient's [name] pressure continued to be  elevated in the  hospital.  Medications were titrated up.  Per Dr.  Kathrene [name], and I agree with Dr. Kathrene [name], that most likely the  hypertension is from, could be secondary to prednisone and fluid  overload and will resolve with time without any further titration of  medications, so we will continue her on the current medication regimen.   Problem 3.  YEAST VAGINITIS:  She was treated with Diflucan.   Problem 4.  ANEMIA:  She does have anemia, hemoglobin 8.6 at discharge,  was receiving Aranesp in the hospital.  Will defer continuation of that  per nephrology.   Problem 5.  PERIPHERAL EDEMA:  She was noted to have severe peripheral  edema and that had improved with the diuresis and will continue with  high-dose Lasix.   Problem 6.  NON-HODGKIN'S LYMPHOMA:  The patient does not require  treatment for her the non-Hodgkin's lymphoma.  Low-grade, not  requiring  treatment   Problem 7.  THROMBOCYTOPENIA:  The patient did have thrombocytopenia,  which resolved during her hospitalization.   DISCHARGE LABS:  Sodium 144, potassium 3.1, chloride 102, CO2 34, BUN  28, creatinine 1.43, glucose 93.  WBC 6.8, hemoglobin 8.6, platelets  187.      [name], M.D.  Electronically Signed     NJ/MEDQ  D:  04/24/2007  T:  04/25/2007  Job:  [id number]

## 2010-11-16 NOTE — Discharge Summary (Signed)
[name], [name]               ACCOUNT NO.:  [id number]   MEDICAL RECORD NO.:  [id number]          PATIENT TYPE:  INP   LOCATION:  5033                         FACILITY:  [facility]   PHYSICIAN:  [name], D.O.    DATE OF BIRTH:  1951/05/12   DATE OF ADMISSION:  03/25/2007  DATE OF DISCHARGE:                               DISCHARGE [name]   [name]:   PRIMARY CARE PHYSICIAN:  [name] is unassigned.  [name] sees [name] for  rheumatologic problems but has requested a primary care physician is  being directed to find a primary care physician at the conclusion of  this hospitalization.  I have contacted [facility]  and have arranged a new patient primary care physician followup with Dr.  Nicholos [name] on April 17, 2007, at 1:30 p.m.   PRIMARY ONCOLOGIST DURING THIS HOSPITALIZATION:  [name], M.D.   NEPHROLOGY:  [name], M.D.   DATE OF [name]:  April 03, 2007.   DIAGNOSES:  1. Non-Hodgkin's lymphoma as per lymph node biopsy pathology, though      the final pathology results are not in the computer system. This is      as reported by Dr. Myna [name].  In addition, bone marrow biopsy      results are currently pending as well. [name] is to undergo further      workup as an outpatient that would include a PET scan following      hospitalization.  2. Acute renal failure status post inpatient consultation with Dr.      Hyman [name] of nephrology who feels that this is related to contrast      nephropathy, and her present creatinine is 2.21.  We are awaiting      some degree of improvement prior to final discharge.  3. History of lupus.  4. Rheumatoid arthritis.  5. Sjogren's.  6. [name] is chronically on prednisone as well as Plaquenil.  We held her      Plaquenil for short periods as we felt this may be contributing to      her thrombocytopenia.  7. 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 [name] had      undergone a transfusion of platelets with her platelet levels      oscillating a bit but now on an up trend with current platelet      count at 68,000 and no evidence of bleeding.  8. Lower extremity edema.  This was felt to be related to acute renal      failure and contrast nephropathy.  [name] is being followed by      nephrology at this time.  9. Microcytic anemia with pending results of an anemia profile      currently ordered.  10.History of asthma.  11.History of hypertension.   PROCEDURES PERFORMED THIS ADMISSION:  1. [name] underwent any laparoscopic right iliac retroperitoneal      dissection with excisional biopsy of three iliac nodes and lysis of      adhesions  by [name] on March 29, 2007.  2. [name] underwent a bone marrow biopsy by interventional radiology on      March 31, 2007, for which results are currently pending.  3. [name] had MRI of her lumbar spine on September 23 that revealed      moderate retroperitoneal and pelvic lymphadenopathy raising the      possibility of the lymphoma.  There was no fracture or neoplastic      of the spine noted.  There is some lumbar disk degeneration at L4-      L5, some disk protrusion and facet arthropathy. There was some mild      spinal stenosis.  4. [name] underwent a gastric emptying study on March 30, 2007, that      revealed a mild delay in gastric emptying with 36% of contents at 2      hours.  5. [name] had a renal ultrasound on September 21 that revealed bilateral      parapelvic cyst and no hydronephrosis.  6. Her CT of the chest on September 18, which was prior to this      hospitalization, was reviewed and revealed some bilateral axillary      adenopathy, right greater than left, extending into the superior      right breast.  A mammogram at some point is recommended.  7. [name] had a CT biopsy as noted above, for which the results are      pending.   LABORATORY DATA:  At  the time of this dictation, her WBC is 6000,  hemoglobin 8,500, platelet count 68,000.  Sodium 131, potassium 4.4, BUN  41, creatinine 2.21, and glucose of 105.  Her platelets have been as low  as 25,000.  [name] has undergone a platelet transfusion x1.  They seem to  be on the up trend at this time. Dr. Myna [name] has followed her throughout  her course.   CURRENT MEDICATIONS:  1. Lasix 20 mg IV q. 12.  2. Plaquenil 200 mg a day.  3. Protonix 40 mg daily.  4. Prednisone 10 mg daily.  5. Senokot one p.o. nightly, hold if diarrhea.  6. Tylenol 650 q. 4 h p.r.n.  7. Dulcolax suppositories 10 mg per rectum p.r.n.  8. Reglan 10 mg IV q 8 h.   HISTORY OF PRESENT ILLNESS:  For to full details, please refer to H&P as  dictated by [name].  However briefly, [name] presented  with complaints of nausea, vomiting, abdominal pain for approximately 5  days' duration.  [name] had been to the emergency department and had been  evaluated, complaining of radiation to left lower quadrant. [name] reported  being diagnosed with a urinary tract infection and being placed on  CIPRO.  [name] reported an allergic reaction, developed hives and  subsequently changed to Macrobid. In any event,  [name] represented,  continued to have discomfort and pain, and [name] was admitted for further  evaluation and management, and Hospital Course is as described above.  [name] continued to have these symptoms and it was thought [name] might have a  pyelonephritis.  It was discovered that her platelets had dropped to  20,000, and [name] persisted with anorexia and nausea. In review of her  imaging studies, it was noted that [name] had retroperitoneal and pelvic  adenopathy.  Pursuit of biopsy was considered, and it was felt by Dr.  Myna [name], who was following her for thrombocytopenia, that [name] would  require surgical biopsy to  obtain suitable amounts of material for  adequate diagnosis.  [name] performed the biopsy as   described above.  [name] continued to have symptoms, though these did  slightly improve. Her platelets did drop to 38,000 at which point Dr.  Myna [name] recommended platelet transfusion.   Because of complaints of pain, Dr. Lovell Sheehan also ordered an MRI of the  lumbar spine with findings as described above. The results of the lymph  node biopsy ultimately revealed non-Hodgkin's lymphoma, low grade as per  Dr. Myna [name].  At this juncture, [name] has also developed acute renal  failure which is felt to be contrast related, and [name] is being followed  by Nephrology at this time. Her symptoms wax and wane with fevers,  chills, nausea, and  anorexia.  At times [name] does appear quite well.  [name] does maintain that [name] has some lower extremity edema.  We will also  order an anemia profile for further evaluation of her of her anemia.  At  this time we are awaiting the final results of her bone marrow biopsy;  however, this is likely to reveal also lymphoma.   Her disposition is pending renal clearance for discharge at which point  [name] can follow up with Dr. Myna [name] in the outpatient setting to address  her lymphoma.  He has requested the PET scan be performed, and this can  be done in the outpatient setting. Her platelets are improving at 68,000  at this time. For final medicine reconciliation, please refer to  dictation at time of actual discharge.      [name], D.O.  Electronically Signed     ESS/MEDQ  D:  04/03/2007  T:  04/03/2007  Job:  [id number]   cc:   [name], M.D.  Georgianne Fick, M.D.  [name], M.D.  Areatha Keas, M.D.

## 2010-11-16 NOTE — Group Therapy Note (Signed)
[name], THORNELL               ACCOUNT NO.:  [id number]   MEDICAL RECORD NO.:  [id number]          PATIENT TYPE:  OUT   LOCATION:  XRAY                         FACILITY:  [facility]   PHYSICIAN:  Angus G. Renard Matter, MD   DATE OF BIRTH:  08-20-1950   DATE OF PROCEDURE:  DATE OF DISCHARGE:  05/30/2007                                 PROGRESS NOTE   This patient was admitted with left-sided chest pain. She did have an  elevated D-dimer. Did not have CT. Her VQ scan showed intermediate risk  of pulmonary embolus. The patient has been anticoagulated.   OBJECTIVE:  VITAL SIGNS:  Blood pressure 136/88, respiration 18, pulse  86, temp 98.6, O2 sats range between 96 and 97, blood sugars range from  76-120.  LUNGS:  Diminished breath sounds bilaterally.  HEART:  Regular rhythm.  ABDOMEN:  No palpable organs or masses.  The patient does have cast on  left lower leg.   PERTINENT LABORATORY DATA:  Hemoglobin 9.0, hematocrit 25.7, INR 1.7.  Chemistries within normal limits with exception of BUN 45, creatinine  2.11.   ASSESSMENT:  The patient was admitted with what was felt to be pulmonary  embolus, chest pain and pneumonia. Does have a history of chronic  anemia, chronic kidney disease.   PLAN:  To continue current anticoagulation. Continue current  medications.      Angus G. Renard Matter, MD  Electronically Signed     AGM/MEDQ  D:  08/16/2007  T:  08/17/2007  Job:  [id number]

## 2010-11-16 NOTE — Op Note (Signed)
NAMEJILLIANA, [name]               ACCOUNT NO.:  [id number]   MEDICAL RECORD NO.:  [id number]          PATIENT TYPE:  INP   LOCATION:  5033                         FACILITY:  [facility]   PHYSICIAN:  Ardeth Sportsman, MD     DATE OF BIRTH:  [birth year]   DATE OF PROCEDURE:  DATE OF DISCHARGE:                               OPERATIVE REPORT   PRIMARY CARE PHYSICIAN:  Areatha Keas, M.D.   HEMATOLOGIST/ONCOLOGIST:  Rose Phi. Myna Hidalgo, M.D.   SURGEON:  Ardeth Sportsman, MD   ASSISTANT:  Lennie Muckle, MD and D. Kov[place], [place]   PREOPERATIVE DIAGNOSIS:  Diffuse lymphadenopathy, especially  retroperitoneal, rule out lymphoma.   POSTOPERATIVE DIAGNOSIS:  Diffuse lymphadenopathy, especially  retroperitoneal, rule out lymphoma.   PROCEDURES:  1. Diagnostic laparoscopy.  2. Lysis of adhesions x15 minutes.  3. Laparoscopic right iliac retroperitoneal dissection with excisional      biopsy of iliac nodes x3.   ANESTHESIA:  1. General anesthesia.  2. Local anesthetic in a field block on all port sites.   SPECIMENS:  Right iliac lymph nodes.   DRAINS:  None.   ESTIMATED BLOOD LOSS:  25 mL.   FLUIDS:  A 6-pack of platelets was transfused just prior to and for the  first 30 minutes of the case for surgery.   COMPLICATIONS:  None.   INDICATIONS FOR PROCEDURE:  [name] is an unfortunate 60 year old  female with chronic pain, lupus, morbid obesity, and thrombocytopenia  with worsening lymphadenopathy.  She has undergone extensive workup,  followed by Dr. Myna Hidalgo.  Based on concerns of worsening  lymphadenopathy, he requested retroperitoneal lymph node biopsy.   Anatomy and physiology of lymphatic system and lymph nodes was  discussed.  Options were discussed and recommendation was made for  diagnostic laparoscopy with excision of right iliac lymph nodes, since  she only had her new lymphadenopathy that seemed to be worse in the  retroperitoneal and she did not have easy access to inguinal or  axillary  nodes at this time.   Risks such as stroke, heart attack, deep venous thrombosis, pulmonary  embolus and death discussed.  Risks such as bleeding, need for  transfusion, wound infection, abscess, injury to other organs and other  risks were discussed.  Questions were answered and she agreed to  proceed.   OPERATIVE FINDINGS:  She had enlarged lymph nodes along the right iliac  region.  She had some moderate adhesions of omentum in the periumbilical  region.  She had no interloop adhesions.   DESCRIPTION OF PROCEDURE:  Informed consent was confirmed.  Patient  received 2 grams IV cefazolin just prior to surgery.  She had sequential  compressive devices active during the entire case.  She was positioned  supine and both arms tucked.  She underwent general anesthesia without  any difficulty.  Her abdomen was prepped and draped in a sterile  fashion.   The patient was placed in deep reversed Trendelenburg and left side up.  Using optical entry,  I placed in the 5-mm port in the abdomen.  Under  direct visualization 5-mm  ports were placed in the right flank and right  lower quadrant.  Harmonic dissection was used to free the omental  adhesions off the anterior abdominal wall, giving her bleeding issues.  A careful meticulous inspection was made and hemostasis was maintained  on the omentum.  There was one attachment of small bowel to the anterior  abdominal wall and this was freed off sharply.   The right colon was freed over from a lateral-to-medial fashion using  harmonic dissection and careful blunt dissection to help better expose  the right iliac region.  Dissection was carried up until the duodenum  could be seen.  However, given her morbid obesity and loops it was hard  to get a good location and get access to the retroduodenal wounds which  seemed to be good candidates.  Therefore, attention was followed more  towards the right iliac region.  Careful dissection was  made in the  retroperitoneum and the right gonadal vessels and the right ureter could  be seen and reflected medially and anteriorly.  In doing this, we came  on some enlarged ellipsoid structures that were running near the iliac  vessels consistent with enlarged lymph nodes.  Careful blunt and  harmonic dissection was used to circumferentially free these tissues  off.  We did have to use some triangular shaped and fan shaped  retractors in order to try to get the small bowel and floppy sigmoid  colon and right colon out of the way to see this well.  Ultimately, we  was able to add the more proximal lymph node did not get fractured, but  the latter 2 lymph nodes came all in one large specimen and they were  intact.  They were placed inside an EndoCatch bag, removed and sent  fresh to pathology.   Copious irrigation of about 1.5 liters of saline was done with clear  return.  Inspection revealed no active bleeding.  Hemostasis was  excellent.  There was no evidence of injury to bowel, colon or cecum.  The colon was allowed to reflect back into its normal position.  The  small bowel was run to make sure it stayed out of the pelvis.  Omentum  laid well.  The fascial defect around the umbilical region was large  enough to allow my finger to pass and therefore I closed using a 0  Vicryl in a figure-of-8 fashion using laparoscopic suture passage under  direct visualization.  Most of the 5-mm ports were removed with no  evidence of bleeding of the skin or the peritoneum.  Counterperitoneum  was actively evacuated and the bladder port was removed.  Skin without a  fascial stitch was tied down in the periumbilical region.  The skin was  closed using 4-0 Monocryl stitch.  A sterile dressing was applied.  Patient was extubated and taken to recovery room in stable condition.   I explained the operative findings to the patient's family.  Questions  were answered and they expressed appreciation.  We  will have followup  with Dr. Myna Hidalgo on lymph node biopsies and followup laboratory values  postoperatively to make sure she does not have any problems.  Please  note that during the case, she was able to make a good clot during  dissection and there was no evidence of any continued bleeding.      Ardeth Sportsman, MD  Electronically Signed     SCG/MEDQ  D:  03/29/2007  T:  03/29/2007  Job:  [id number]

## 2010-11-19 NOTE — Assessment & Plan Note (Signed)
[facility]                             PULMONARY OFFICE NOTE   NAME:[name], Erica                        MRN:          [id number]  DATE:10/04/2006                            DOB:          03-26-1951    HISTORY:  A 60 year old black female with morbid obesity and suspected  GERD that is partially related to obesity and partially related to that  fact that she is steroid dependent rheumatoid arthritis. She comes back  today having no complaints and actually intentionally losing weight from  a peak from around 230 down now to 203 and feels much better with  improving exercise tolerance and no significant cough. She continues to  have mild throat clearing, and mild sensation of postnasal drainage but  no excess sputum production, or nocturnal disturbance.   On physical examination, she is a pleasant ambulatory, black female in  no acute distress. She is afebrile, stable vital signs.  HEENT: Unremarkable. Pharynx clear.  LUNG FIELDS: Reveal a few crackles in the bases only.  There is a regular rate and rhythm without murmur, gallop, or rub.  ABDOMEN: Obese, benign.  EXTREMITIES: Warm without calf tenderness, cyanosis, clubbing, or edema.   Heme saturation 95% on room air.   PFTs performed today and compared to previous study available from  November of 2007 shows definite improvement in lung volumes up to 2.24  liters which is 67% predicted without any evidence of air flow  obstruction, diffusing capacity has increase up now to 48%.   IMPRESSION:  1. 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  2. Because she is on prednisone and obese, she probably has secondary      reflux but it may well be that she can get by with 1 dose daily as      long as she watches diet and continues to lose weight. If she      requires b.i.d. dosing to control any upper  respiratory symptoms      including, excessive throat clearing, overt reflux, or cough, I      think she would be better served seeing a gastrointestinal      specialist at Dr. Renaldo Reel discretion than continuing on high dose      Nexium indefinitely.   Since she is treading in the right direction I told her unless she has  increasing symptoms of dyspnea or cough, I will see her back yearly for  PFTs sooner certainly if she or Dr. Phylliss Bob feel I can be of further  service.     Charlaine Dalton. Sherene Sires, MD, [facility]  Electronically Signed    MBW/MedQ  DD: 10/04/2006  DT: 10/04/2006  Job #: [id number]   cc:   Areatha Keas, M.D.

## 2011-03-09 ENCOUNTER — Encounter (HOSPITAL_BASED_OUTPATIENT_CLINIC_OR_DEPARTMENT_OTHER): Payer: BC Managed Care – PPO | Admitting: Hematology & Oncology

## 2011-03-09 ENCOUNTER — Other Ambulatory Visit: Payer: Self-pay | Admitting: Hematology & Oncology

## 2011-03-09 DIAGNOSIS — N289 Disorder of kidney and ureter, unspecified: Secondary | ICD-10-CM

## 2011-03-09 DIAGNOSIS — D649 Anemia, unspecified: Secondary | ICD-10-CM

## 2011-03-09 DIAGNOSIS — C8589 Other specified types of non-Hodgkin lymphoma, extranodal and solid organ sites: Secondary | ICD-10-CM

## 2011-03-09 LAB — CBC WITH DIFFERENTIAL (CANCER CENTER ONLY)
BASO#: 0 10*3/uL (ref 0.0–0.2)
Eosinophils Absolute: 0.1 10*3/uL (ref 0.0–0.5)
HGB: 11.3 g/dL — ABNORMAL LOW (ref 11.6–15.9)
LYMPH#: 1.6 10*3/uL (ref 0.9–3.3)
MCH: 32.9 pg (ref 26.0–34.0)
MONO%: 7.2 % (ref 0.0–13.0)
NEUT#: 4.1 10*3/uL (ref 1.5–6.5)
Platelets: 125 10*3/uL — ABNORMAL LOW (ref 145–400)
RBC: 3.43 10*6/uL — ABNORMAL LOW (ref 3.70–5.32)
WBC: 6.1 10*3/uL (ref 3.9–10.0)

## 2011-03-11 LAB — RETICULOCYTES (CHCC)
ABS Retic: 70 10*3/uL (ref 19.0–186.0)
RBC.: 3.5 MIL/uL — ABNORMAL LOW (ref 3.87–5.11)

## 2011-03-11 LAB — COMPREHENSIVE METABOLIC PANEL
Albumin: 3.7 g/dL (ref 3.5–5.2)
Alkaline Phosphatase: 89 U/L (ref 39–117)
BUN: 16 mg/dL (ref 6–23)
Calcium: 8.8 mg/dL (ref 8.4–10.5)
Glucose, Bld: 89 mg/dL (ref 70–99)
Potassium: 3.9 mEq/L (ref 3.5–5.3)

## 2011-03-11 LAB — SPEP & IFE WITH QIG
Beta 2: 4.6 % (ref 3.2–6.5)
Gamma Globulin: 7.3 % — ABNORMAL LOW (ref 11.1–18.8)
IgA: 161 mg/dL (ref 68–380)
IgG (Immunoglobin G), Serum: 549 mg/dL — ABNORMAL LOW (ref 690–1700)
IgM, Serum: <4 mg/dL — ABNORMAL LOW (ref 52–322)

## 2011-03-11 LAB — KAPPA/LAMBDA LIGHT CHAINS
Kappa:Lambda Ratio: 0.57 (ref 0.26–1.65)
Lambda Free Lght Chn: 1.27 mg/dL (ref 0.57–2.63)

## 2011-03-11 LAB — IRON AND TIBC: UIBC: 147 ug/dL (ref 125–400)

## 2011-03-15 ENCOUNTER — Other Ambulatory Visit: Payer: Self-pay | Admitting: Internal Medicine

## 2011-03-15 DIAGNOSIS — Z1231 Encounter for screening mammogram for malignant neoplasm of breast: Secondary | ICD-10-CM

## 2011-04-13 LAB — CBC
HCT: 24.6 — ABNORMAL LOW
HCT: 25 — ABNORMAL LOW
HCT: 25.4 — ABNORMAL LOW
HCT: 25.8 — ABNORMAL LOW
HCT: 26.2 — ABNORMAL LOW
HCT: 26.4 — ABNORMAL LOW
Hemoglobin: 8.2 — ABNORMAL LOW
Hemoglobin: 8.6 — ABNORMAL LOW
Hemoglobin: 8.6 — ABNORMAL LOW
Hemoglobin: 8.6 — ABNORMAL LOW
Hemoglobin: 8.7 — ABNORMAL LOW
Hemoglobin: 8.7 — ABNORMAL LOW
Hemoglobin: 8.8 — ABNORMAL LOW
MCHC: 33.1
MCHC: 33.4
MCHC: 33.7
MCHC: 33.8
MCHC: 34.2
MCHC: 34.2
MCV: 84.2
MCV: 85.4
MCV: 85.9
MCV: 86.5
MCV: 86.8
Platelets: 147 — ABNORMAL LOW
Platelets: 166
Platelets: 174
Platelets: 185
Platelets: 185
Platelets: 187
RBC: 2.93 — ABNORMAL LOW
RBC: 2.97 — ABNORMAL LOW
RBC: 2.99 — ABNORMAL LOW
RBC: 3.03 — ABNORMAL LOW
RBC: 3.03 — ABNORMAL LOW
RBC: 3.07 — ABNORMAL LOW
RDW: 17.1 — ABNORMAL HIGH
RDW: 17.3 — ABNORMAL HIGH
RDW: 17.6 — ABNORMAL HIGH
RDW: 18 — ABNORMAL HIGH
RDW: 18 — ABNORMAL HIGH
RDW: 18.1 — ABNORMAL HIGH
WBC: 10.4
WBC: 10.4
WBC: 11.6 — ABNORMAL HIGH
WBC: 5.9
WBC: 6.8
WBC: 9.3
WBC: 9.4

## 2011-04-13 LAB — DIFFERENTIAL
Basophils Absolute: 0
Basophils Absolute: 0
Basophils Absolute: 0
Basophils Absolute: 0
Basophils Absolute: 0
Basophils Relative: 0
Basophils Relative: 0
Basophils Relative: 0
Basophils Relative: 0
Basophils Relative: 1
Basophils Relative: 1
Eosinophils Absolute: 0
Eosinophils Absolute: 0
Eosinophils Absolute: 0
Eosinophils Absolute: 0
Eosinophils Absolute: 0
Eosinophils Absolute: 0
Eosinophils Absolute: 0
Eosinophils Absolute: 0.1
Eosinophils Relative: 0
Eosinophils Relative: 0
Eosinophils Relative: 0
Eosinophils Relative: 1
Eosinophils Relative: 1
Eosinophils Relative: 1
Lymphocytes Relative: 12
Lymphocytes Relative: 16
Lymphocytes Relative: 3 — ABNORMAL LOW
Lymphocytes Relative: 5 — ABNORMAL LOW
Lymphocytes Relative: 6 — ABNORMAL LOW
Lymphocytes Relative: 8 — ABNORMAL LOW
Lymphocytes Relative: 9 — ABNORMAL LOW
Lymphs Abs: 0.3 — ABNORMAL LOW
Lymphs Abs: 0.5 — ABNORMAL LOW
Lymphs Abs: 0.6 — ABNORMAL LOW
Lymphs Abs: 0.6 — ABNORMAL LOW
Lymphs Abs: 0.7
Lymphs Abs: 0.8
Lymphs Abs: 0.8
Lymphs Abs: 1
Monocytes Absolute: 0.1 — ABNORMAL LOW
Monocytes Absolute: 0.2
Monocytes Absolute: 0.4
Monocytes Absolute: 0.5
Monocytes Absolute: 0.5
Monocytes Absolute: 0.5
Monocytes Absolute: 0.9 — ABNORMAL HIGH
Monocytes Relative: 2 — ABNORMAL LOW
Monocytes Relative: 5
Monocytes Relative: 6
Monocytes Relative: 8
Monocytes Relative: 8
Monocytes Relative: 9
Neutro Abs: 4.3
Neutro Abs: 5.4
Neutro Abs: 8.3 — ABNORMAL HIGH
Neutro Abs: 8.6 — ABNORMAL HIGH
Neutro Abs: 9.9 — ABNORMAL HIGH
Neutrophils Relative %: 73
Neutrophils Relative %: 80 — ABNORMAL HIGH
Neutrophils Relative %: 83 — ABNORMAL HIGH
Neutrophils Relative %: 88 — ABNORMAL HIGH
Neutrophils Relative %: 89 — ABNORMAL HIGH
Neutrophils Relative %: 93 — ABNORMAL HIGH
Neutrophils Relative %: 95 — ABNORMAL HIGH

## 2011-04-13 LAB — BASIC METABOLIC PANEL
BUN: 25 — ABNORMAL HIGH
BUN: 37 — ABNORMAL HIGH
CO2: 33 — ABNORMAL HIGH
CO2: 33 — ABNORMAL HIGH
Calcium: 8.3 — ABNORMAL LOW
Calcium: 8.4
Chloride: 105
Chloride: 106
Creatinine, Ser: 1.55 — ABNORMAL HIGH
Creatinine, Ser: 1.68 — ABNORMAL HIGH
GFR calc Af Amer: 38 — ABNORMAL LOW
GFR calc Af Amer: 42 — ABNORMAL LOW
GFR calc non Af Amer: 31 — ABNORMAL LOW
GFR calc non Af Amer: 34 — ABNORMAL LOW
Glucose, Bld: 102 — ABNORMAL HIGH
Glucose, Bld: 99
Potassium: 3 — ABNORMAL LOW
Potassium: 3.3 — ABNORMAL LOW
Sodium: 145
Sodium: 145

## 2011-04-13 LAB — RENAL FUNCTION PANEL
Albumin: 2.2 — ABNORMAL LOW
Albumin: 2.6 — ABNORMAL LOW
BUN: 28 — ABNORMAL HIGH
BUN: 33 — ABNORMAL HIGH
BUN: 64 — ABNORMAL HIGH
CO2: 29
CO2: 30
CO2: 33 — ABNORMAL HIGH
CO2: 34 — ABNORMAL HIGH
Calcium: 8.1 — ABNORMAL LOW
Calcium: 8.3 — ABNORMAL LOW
Chloride: 102
Chloride: 102
Chloride: 102
Chloride: 107
Creatinine, Ser: 1.43 — ABNORMAL HIGH
Creatinine, Ser: 1.57 — ABNORMAL HIGH
GFR calc Af Amer: 21 — ABNORMAL LOW
GFR calc Af Amer: 30 — ABNORMAL LOW
GFR calc Af Amer: 41 — ABNORMAL LOW
GFR calc non Af Amer: 17 — ABNORMAL LOW
GFR calc non Af Amer: 25 — ABNORMAL LOW
GFR calc non Af Amer: 34 — ABNORMAL LOW
Glucose, Bld: 103 — ABNORMAL HIGH
Glucose, Bld: 104 — ABNORMAL HIGH
Glucose, Bld: 107 — ABNORMAL HIGH
Glucose, Bld: 141 — ABNORMAL HIGH
Phosphorus: 2.9
Potassium: 2.8 — ABNORMAL LOW
Potassium: 3.6
Potassium: 4.2
Sodium: 140
Sodium: 142
Sodium: 145

## 2011-04-13 LAB — COMPREHENSIVE METABOLIC PANEL
ALT: 13
AST: 19
Albumin: 2.4 — ABNORMAL LOW
Alkaline Phosphatase: 79
BUN: 37 — ABNORMAL HIGH
CO2: 27
Calcium: 7.9 — ABNORMAL LOW
Chloride: 106
Creatinine, Ser: 2.76 — ABNORMAL HIGH
GFR calc Af Amer: 21 — ABNORMAL LOW
GFR calc non Af Amer: 18 — ABNORMAL LOW
Glucose, Bld: 144 — ABNORMAL HIGH
Potassium: 4.3
Sodium: 140
Total Bilirubin: 0.6
Total Protein: 4.7 — ABNORMAL LOW

## 2011-04-13 LAB — MAGNESIUM: Magnesium: 1.7

## 2011-04-14 LAB — IGG, IGA, IGM
IgA: 321
IgG (Immunoglobin G), Serum: 390 — ABNORMAL LOW
IgM, Serum: 168
IgM, Serum: 168

## 2011-04-14 LAB — DIFFERENTIAL
Basophils Absolute: 0
Basophils Absolute: 0
Basophils Absolute: 0.1
Basophils Absolute: 0.1
Basophils Absolute: 0.1
Basophils Absolute: 0.1
Basophils Absolute: 0
Basophils Absolute: 0
Basophils Absolute: 0
Basophils Relative: 0
Basophils Relative: 0
Basophils Relative: 0
Basophils Relative: 1
Basophils Relative: 1
Basophils Relative: 0
Basophils Relative: 0
Basophils Relative: 0
Eosinophils Absolute: 0
Eosinophils Absolute: 0
Eosinophils Absolute: 0.1
Eosinophils Absolute: 0.1
Eosinophils Absolute: 0.1
Eosinophils Absolute: 0.1
Eosinophils Absolute: 0.2
Eosinophils Absolute: 0
Eosinophils Absolute: 0
Eosinophils Relative: 0
Eosinophils Relative: 0
Eosinophils Relative: 1
Eosinophils Relative: 1
Eosinophils Relative: 2
Eosinophils Relative: 0
Eosinophils Relative: 0
Eosinophils Relative: 1
Lymphocytes Relative: 10 — ABNORMAL LOW
Lymphocytes Relative: 10 — ABNORMAL LOW
Lymphocytes Relative: 8 — ABNORMAL LOW
Lymphocytes Relative: 9 — ABNORMAL LOW
Lymphocytes Relative: 9 — ABNORMAL LOW
Lymphocytes Relative: 12
Lymphocytes Relative: 6 — ABNORMAL LOW
Lymphs Abs: 0.8
Lymphs Abs: 0.8
Lymphs Abs: 0.9
Lymphs Abs: 1.1
Lymphs Abs: 1.2
Monocytes Absolute: 0.1 — ABNORMAL LOW
Monocytes Absolute: 0.4
Monocytes Absolute: 0.4
Monocytes Absolute: 0.5
Monocytes Absolute: 0.5
Monocytes Absolute: 0.5
Monocytes Absolute: 0.6
Monocytes Absolute: 0.8 — ABNORMAL HIGH
Monocytes Relative: 1 — ABNORMAL LOW
Monocytes Relative: 4
Monocytes Relative: 4
Monocytes Relative: 4
Monocytes Relative: 4
Monocytes Relative: 6
Neutro Abs: 11.6 — ABNORMAL HIGH
Neutro Abs: 7.6
Neutro Abs: 9.1 — ABNORMAL HIGH
Neutro Abs: 9.2 — ABNORMAL HIGH
Neutro Abs: 9.8 — ABNORMAL HIGH
Neutro Abs: 4.4
Neutro Abs: 8.3 — ABNORMAL HIGH
Neutrophils Relative %: 83 — ABNORMAL HIGH
Neutrophils Relative %: 84 — ABNORMAL HIGH
Neutrophils Relative %: 85 — ABNORMAL HIGH
Neutrophils Relative %: 86 — ABNORMAL HIGH
Neutrophils Relative %: 86 — ABNORMAL HIGH
Neutrophils Relative %: 87 — ABNORMAL HIGH
Neutrophils Relative %: 86 — ABNORMAL HIGH
Neutrophils Relative %: 88 — ABNORMAL HIGH
Neutrophils Relative %: 90 — ABNORMAL HIGH

## 2011-04-14 LAB — CBC
HCT: 26.9 — ABNORMAL LOW
HCT: 27.7 — ABNORMAL LOW
HCT: 28.5 — ABNORMAL LOW
HCT: 29.7 — ABNORMAL LOW
HCT: 32.4 — ABNORMAL LOW
HCT: 24.9 — ABNORMAL LOW
HCT: 25.4 — ABNORMAL LOW
HCT: 33.1 — ABNORMAL LOW
HCT: 34 — ABNORMAL LOW
HCT: 36.2
HCT: 38.1
Hemoglobin: 10 — ABNORMAL LOW
Hemoglobin: 11 — ABNORMAL LOW
Hemoglobin: 12.2
Hemoglobin: 9 — ABNORMAL LOW
Hemoglobin: 11.3 — ABNORMAL LOW
Hemoglobin: 11.3 — ABNORMAL LOW
Hemoglobin: 11.4 — ABNORMAL LOW
Hemoglobin: 11.5 — ABNORMAL LOW
Hemoglobin: 12.8
Hemoglobin: 14.3
Hemoglobin: 8.5 — ABNORMAL LOW
Hemoglobin: 8.6 — ABNORMAL LOW
Hemoglobin: 8.7 — ABNORMAL LOW
Hemoglobin: 8.9 — ABNORMAL LOW
Hemoglobin: 9.3 — ABNORMAL LOW
MCHC: 33.3
MCHC: 33.6
MCHC: 33.7
MCHC: 33.9
MCHC: 34.5
MCHC: 33.4
MCHC: 33.5
MCHC: 33.7
MCHC: 33.9
MCHC: 34
MCHC: 34.1
MCHC: 34.1
MCHC: 34.4
MCV: 80.9
MCV: 81.6
MCV: 82.9
MCV: 82.9
MCV: 83.8
MCV: 84.1
MCV: 84.2
MCV: 84.9
MCV: 85.1
MCV: 85.5
MCV: 80.4
MCV: 80.9
MCV: 81.8
MCV: 83.1
MCV: 83.4
MCV: 84
Platelets: 102 — ABNORMAL LOW
Platelets: 132 — ABNORMAL LOW
Platelets: 140 — ABNORMAL LOW
Platelets: 146 — ABNORMAL LOW
Platelets: 212
Platelets: 213
Platelets: 270
Platelets: 63 — ABNORMAL LOW
Platelets: 93 — ABNORMAL LOW
Platelets: 20 — CL
Platelets: 37 — CL
Platelets: 38 — CL
Platelets: 49 — CL
Platelets: 51 — ABNORMAL LOW
Platelets: 64 — ABNORMAL LOW
Platelets: 68 — ABNORMAL LOW
RBC: 2.92 — ABNORMAL LOW
RBC: 3.17 — ABNORMAL LOW
RBC: 3.53 — ABNORMAL LOW
RBC: 4.31
RBC: 3.12 — ABNORMAL LOW
RBC: 4.03
RBC: 4.36
RDW: 15.8 — ABNORMAL HIGH
RDW: 16 — ABNORMAL HIGH
RDW: 16.3 — ABNORMAL HIGH
RDW: 16.4 — ABNORMAL HIGH
RDW: 16.4 — ABNORMAL HIGH
RDW: 17.1 — ABNORMAL HIGH
RDW: 17.8 — ABNORMAL HIGH
RDW: 17.9 — ABNORMAL HIGH
RDW: 13.5
RDW: 14
RDW: 14
RDW: 14.1 — ABNORMAL HIGH
RDW: 14.3 — ABNORMAL HIGH
RDW: 14.9 — ABNORMAL HIGH
RDW: 14.9 — ABNORMAL HIGH
RDW: 15 — ABNORMAL HIGH
RDW: 15.2 — ABNORMAL HIGH
RDW: 15.2 — ABNORMAL HIGH
RDW: 15.4 — ABNORMAL HIGH
WBC: 10.7 — ABNORMAL HIGH
WBC: 10.7 — ABNORMAL HIGH
WBC: 12.5 — ABNORMAL HIGH
WBC: 13.8 — ABNORMAL HIGH
WBC: 8
WBC: 9.1
WBC: 10.6 — ABNORMAL HIGH
WBC: 6.1
WBC: 7.9
WBC: 9
WBC: 9.6

## 2011-04-14 LAB — DIC (DISSEMINATED INTRAVASCULAR COAGULATION)PANEL
Fibrinogen: 650 — ABNORMAL HIGH
INR: 1.1
Platelets: 36 — CL
Prothrombin Time: 14.5
aPTT: 24

## 2011-04-14 LAB — BASIC METABOLIC PANEL
BUN: 30 — ABNORMAL HIGH
BUN: 9
CO2: 20
CO2: 22
CO2: 21
CO2: 21
Calcium: 7.9 — ABNORMAL LOW
Calcium: 7.9 — ABNORMAL LOW
Calcium: 7.9 — ABNORMAL LOW
Calcium: 7.9 — ABNORMAL LOW
Chloride: 109
Chloride: 102
Creatinine, Ser: 2.38 — ABNORMAL HIGH
Creatinine, Ser: 0.99
GFR calc Af Amer: 15 — ABNORMAL LOW
GFR calc Af Amer: 25 — ABNORMAL LOW
GFR calc Af Amer: 28 — ABNORMAL LOW
GFR calc Af Amer: 31 — ABNORMAL LOW
GFR calc Af Amer: 32 — ABNORMAL LOW
GFR calc Af Amer: 57 — ABNORMAL LOW
GFR calc non Af Amer: 23 — ABNORMAL LOW
GFR calc non Af Amer: 26 — ABNORMAL LOW
GFR calc non Af Amer: 26 — ABNORMAL LOW
GFR calc non Af Amer: 48 — ABNORMAL LOW
Glucose, Bld: 100 — ABNORMAL HIGH
Glucose, Bld: 100 — ABNORMAL HIGH
Glucose, Bld: 107 — ABNORMAL HIGH
Glucose, Bld: 88
Glucose, Bld: 93
Potassium: 5
Potassium: 5
Sodium: 142
Sodium: 131 — ABNORMAL LOW
Sodium: 131 — ABNORMAL LOW
Sodium: 132 — ABNORMAL LOW
Sodium: 133 — ABNORMAL LOW

## 2011-04-14 LAB — CARDIAC PANEL(CRET KIN+CKTOT+MB+TROPI)
CK, MB: 1.5
Relative Index: INVALID
Relative Index: INVALID
Relative Index: INVALID
Total CK: 20
Troponin I: 0.02
Troponin I: 0.03

## 2011-04-14 LAB — COMPREHENSIVE METABOLIC PANEL
ALT: 32
AST: 42 — ABNORMAL HIGH
Albumin: 2.9 — ABNORMAL LOW
Alkaline Phosphatase: 104
Alkaline Phosphatase: 69
Alkaline Phosphatase: 87
BUN: 49 — ABNORMAL HIGH
BUN: 13
CO2: 25
Calcium: 8 — ABNORMAL LOW
Chloride: 101
Creatinine, Ser: 2.19 — ABNORMAL HIGH
Creatinine, Ser: 1.01
GFR calc non Af Amer: 56 — ABNORMAL LOW
Glucose, Bld: 113 — ABNORMAL HIGH
Glucose, Bld: 123 — ABNORMAL HIGH
Potassium: 4.3
Potassium: 3 — ABNORMAL LOW
Potassium: 3.2 — ABNORMAL LOW
Sodium: 139
Total Bilirubin: 1.2
Total Protein: 4.2 — ABNORMAL LOW
Total Protein: 6

## 2011-04-14 LAB — RENAL FUNCTION PANEL
Albumin: 1.9 — ABNORMAL LOW
Albumin: 2 — ABNORMAL LOW
Albumin: 2 — ABNORMAL LOW
Albumin: 2.1 — ABNORMAL LOW
Albumin: 2.3 — ABNORMAL LOW
Albumin: 2.3 — ABNORMAL LOW
BUN: 54 — ABNORMAL HIGH
BUN: 72 — ABNORMAL HIGH
BUN: 89 — ABNORMAL HIGH
BUN: 99 — ABNORMAL HIGH
CO2: 19
CO2: 19
CO2: 19
CO2: 20
CO2: 21
Calcium: 7.7 — ABNORMAL LOW
Calcium: 7.8 — ABNORMAL LOW
Calcium: 7.8 — ABNORMAL LOW
Calcium: 8 — ABNORMAL LOW
Calcium: 8.1 — ABNORMAL LOW
Chloride: 103
Chloride: 105
Chloride: 108
Chloride: 109
Creatinine, Ser: 1.91 — ABNORMAL HIGH
Creatinine, Ser: 3.09 — ABNORMAL HIGH
Creatinine, Ser: 3.29 — ABNORMAL HIGH
Creatinine, Ser: 3.48 — ABNORMAL HIGH
Creatinine, Ser: 3.94 — ABNORMAL HIGH
Creatinine, Ser: 3.96 — ABNORMAL HIGH
Creatinine, Ser: 4.08 — ABNORMAL HIGH
GFR calc Af Amer: 13 — ABNORMAL LOW
GFR calc Af Amer: 14 — ABNORMAL LOW
GFR calc Af Amer: 14 — ABNORMAL LOW
GFR calc Af Amer: 14 — ABNORMAL LOW
GFR calc Af Amer: 16 — ABNORMAL LOW
GFR calc Af Amer: 16 — ABNORMAL LOW
GFR calc Af Amer: 17 — ABNORMAL LOW
GFR calc Af Amer: 19 — ABNORMAL LOW
GFR calc Af Amer: 33 — ABNORMAL LOW
GFR calc non Af Amer: 12 — ABNORMAL LOW
GFR calc non Af Amer: 12 — ABNORMAL LOW
GFR calc non Af Amer: 14 — ABNORMAL LOW
GFR calc non Af Amer: 14 — ABNORMAL LOW
GFR calc non Af Amer: 15 — ABNORMAL LOW
GFR calc non Af Amer: 27 — ABNORMAL LOW
Glucose, Bld: 100 — ABNORMAL HIGH
Glucose, Bld: 86
Glucose, Bld: 92
Glucose, Bld: 95
Phosphorus: 5.7 — ABNORMAL HIGH
Phosphorus: 6.3 — ABNORMAL HIGH
Phosphorus: 6.5 — ABNORMAL HIGH
Phosphorus: 7.1 — ABNORMAL HIGH
Potassium: 4.1
Potassium: 4.1
Potassium: 4.2
Potassium: 4.3
Potassium: 4.3
Sodium: 129 — ABNORMAL LOW
Sodium: 132 — ABNORMAL LOW
Sodium: 133 — ABNORMAL LOW
Sodium: 137
Sodium: 137
Sodium: 138
Sodium: 138

## 2011-04-14 LAB — URINALYSIS, ROUTINE W REFLEX MICROSCOPIC
Bilirubin Urine: NEGATIVE
Bilirubin Urine: NEGATIVE
Glucose, UA: NEGATIVE
Glucose, UA: NEGATIVE
Ketones, ur: NEGATIVE
Ketones, ur: NEGATIVE
Leukocytes, UA: NEGATIVE
Nitrite: NEGATIVE
Nitrite: NEGATIVE
Protein, ur: 30 — AB
Protein, ur: >300 — AB
Specific Gravity, Urine: 1.017
Specific Gravity, Urine: 1.004 — ABNORMAL LOW
Specific Gravity, Urine: 1.023
Urobilinogen, UA: 0.2
Urobilinogen, UA: 1
Urobilinogen, UA: 1
Urobilinogen, UA: 1
pH: 5
pH: 5.5
pH: 6.5

## 2011-04-14 LAB — URINE MICROSCOPIC-ADD ON

## 2011-04-14 LAB — FOLATE: Folate: 6.8

## 2011-04-14 LAB — PROTEIN, URINE, 24 HOUR
Collection Interval-UPROT: 24
Protein, 24H Urine: 1032 — ABNORMAL HIGH
Urine Total Volume-UPROT: 800

## 2011-04-14 LAB — IRON AND TIBC
Saturation Ratios: 10 — ABNORMAL LOW
TIBC: 251
UIBC: 227

## 2011-04-14 LAB — URINE CULTURE
Culture: NO GROWTH
Culture: NO GROWTH
Special Requests: NEGATIVE

## 2011-04-14 LAB — BLEEDING TIME

## 2011-04-14 LAB — PROTEIN ELECTROPH W RFLX QUANT IMMUNOGLOBULINS
Alpha-1-Globulin: 12.1 — ABNORMAL HIGH
Beta 2: 7.3 — ABNORMAL HIGH
Gamma Globulin: 6.9 — ABNORMAL LOW

## 2011-04-14 LAB — TSH: TSH: 1.525

## 2011-04-14 LAB — CROSSMATCH

## 2011-04-14 LAB — PROTIME-INR
INR: 1.1
INR: 1.2

## 2011-04-14 LAB — HEPATIC FUNCTION PANEL
ALT: 35
Albumin: 2.8 — ABNORMAL LOW
Alkaline Phosphatase: 102
Total Protein: 5.7 — ABNORMAL LOW

## 2011-04-14 LAB — I-STAT 8, (EC8 V) (CONVERTED LAB)
Bicarbonate: 25.7 — ABNORMAL HIGH
Glucose, Bld: 102 — ABNORMAL HIGH
TCO2: 27
pH, Ven: 7.412 — ABNORMAL HIGH

## 2011-04-14 LAB — SODIUM, URINE, RANDOM: Sodium, Ur: <10

## 2011-04-14 LAB — UIFE/LIGHT CHAINS/TP QN, 24-HR UR
Alpha 1, Urine: DETECTED — AB
Alpha 2, Urine: DETECTED — AB
Free Kappa Lt Chains,Ur: 183 — ABNORMAL HIGH (ref 0.04–1.51)
Free Lt Chn Excr Rate: 2745
Gamma Globulin, Urine: DETECTED — AB
Time: 24

## 2011-04-14 LAB — C4 COMPLEMENT: Complement C4, Body Fluid: <2 — ABNORMAL LOW

## 2011-04-14 LAB — ANA: Anti Nuclear Antibody(ANA): NEGATIVE

## 2011-04-14 LAB — PREPARE PLATELET PHERESIS

## 2011-04-14 LAB — SAVE SMEAR

## 2011-04-14 LAB — CHROMOSOME ANALYSIS, BONE MARROW

## 2011-04-14 LAB — CREATININE, URINE, RANDOM: Creatinine, Urine: 80.9

## 2011-04-14 LAB — ERYTHROPOIETIN: Erythropoietin: 9.2

## 2011-04-14 LAB — APTT
aPTT: 25
aPTT: 24

## 2011-04-14 LAB — ABO/RH: ABO/RH(D): B POS

## 2011-04-14 LAB — LIPASE, BLOOD: Lipase: 17

## 2011-04-14 LAB — RETICULOCYTES: Retic Ct Pct: 2.4

## 2011-04-14 LAB — ANGIOTENSIN CONVERTING ENZYME: Angiotensin-Converting Enzyme: 90 U/L — ABNORMAL HIGH (ref 9–67)

## 2011-04-27 ENCOUNTER — Ambulatory Visit
Admission: RE | Admit: 2011-04-27 | Discharge: 2011-04-27 | Disposition: A | Discharge status: Home / Self Care | Payer: BC Managed Care – PPO | Source: Ambulatory Visit | Attending: Internal Medicine | Admitting: Internal Medicine

## 2011-04-27 DIAGNOSIS — Z1231 Encounter for screening mammogram for malignant neoplasm of breast: Secondary | ICD-10-CM

## 2011-07-13 ENCOUNTER — Other Ambulatory Visit: Payer: Self-pay | Admitting: Hematology & Oncology

## 2011-07-13 ENCOUNTER — Other Ambulatory Visit (HOSPITAL_BASED_OUTPATIENT_CLINIC_OR_DEPARTMENT_OTHER): Payer: BC Managed Care – PPO | Admitting: Lab

## 2011-07-13 ENCOUNTER — Ambulatory Visit (HOSPITAL_BASED_OUTPATIENT_CLINIC_OR_DEPARTMENT_OTHER): Payer: BC Managed Care – PPO | Admitting: Hematology & Oncology

## 2011-07-13 ENCOUNTER — Encounter: Payer: Self-pay | Admitting: Hematology & Oncology

## 2011-07-13 DIAGNOSIS — C8589 Other specified types of non-Hodgkin lymphoma, extranodal and solid organ sites: Secondary | ICD-10-CM

## 2011-07-13 DIAGNOSIS — C8300 Small cell B-cell lymphoma, unspecified site: Secondary | ICD-10-CM

## 2011-07-13 DIAGNOSIS — IMO0002 Reserved for concepts with insufficient information to code with codable children: Secondary | ICD-10-CM

## 2011-07-13 DIAGNOSIS — M329 Systemic lupus erythematosus, unspecified: Secondary | ICD-10-CM

## 2011-07-13 DIAGNOSIS — L930 Discoid lupus erythematosus: Secondary | ICD-10-CM | POA: Insufficient documentation

## 2011-07-13 HISTORY — DX: Systemic lupus erythematosus, unspecified: M32.9

## 2011-07-13 HISTORY — DX: Small cell B-cell lymphoma, unspecified site: C83.00

## 2011-07-13 HISTORY — DX: Reserved for concepts with insufficient information to code with codable children: IMO0002

## 2011-07-13 LAB — COMPREHENSIVE METABOLIC PANEL
ALT: 17 U/L (ref 0–35)
ALT: 17 U/L (ref 0–35)
AST: 18 U/L (ref 0–37)
Albumin: 4.2 g/dL (ref 3.5–5.2)
Albumin: 4.2 g/dL (ref 3.5–5.2)
Alkaline Phosphatase: 88 U/L (ref 39–117)
Alkaline Phosphatase: 88 U/L (ref 39–117)
BUN: 15 mg/dL (ref 6–23)
CO2: 25 mEq/L (ref 19–32)
CO2: 25 mEq/L (ref 19–32)
Calcium: 9 mg/dL (ref 8.4–10.5)
Chloride: 105 mEq/L (ref 96–112)
Creatinine, Ser: 1.19 mg/dL — ABNORMAL HIGH (ref 0.50–1.10)
Creatinine, Ser: 1.19 mg/dL — ABNORMAL HIGH (ref 0.50–1.10)
Glucose, Bld: 84 mg/dL (ref 70–99)
Glucose, Bld: 84 mg/dL (ref 70–99)
Potassium: 3.7 mEq/L (ref 3.5–5.3)
Sodium: 144 mEq/L (ref 135–145)
Total Bilirubin: 0.5 mg/dL (ref 0.3–1.2)
Total Bilirubin: 0.5 mg/dL (ref 0.3–1.2)
Total Bilirubin: 0.5 mg/dL (ref 0.3–1.2)
Total Protein: 5.8 g/dL — ABNORMAL LOW (ref 6.0–8.3)
Total Protein: 5.8 g/dL — ABNORMAL LOW (ref 6.0–8.3)

## 2011-07-13 LAB — CBC WITH DIFFERENTIAL (CANCER CENTER ONLY)
BASO%: 0.2 % (ref 0.0–2.0)
Eosinophils Absolute: 0.1 10*3/uL (ref 0.0–0.5)
HCT: 37.5 % (ref 34.8–46.6)
LYMPH%: 28.4 % (ref 14.0–48.0)
MCH: 32.2 pg (ref 26.0–34.0)
MCV: 98 fL (ref 81–101)
MONO#: 0.5 10*3/uL (ref 0.1–0.9)
MONO%: 9.2 % (ref 0.0–13.0)
NEUT%: 60.9 % (ref 39.6–80.0)
RDW: 13 % (ref 11.1–15.7)
WBC: 5.2 10*3/uL (ref 3.9–10.0)

## 2011-07-13 LAB — LACTATE DEHYDROGENASE
LDH: 111 U/L (ref 94–250)
LDH: 111 U/L (ref 94–250)

## 2011-07-13 LAB — BETA 2 MICROGLOBULIN, SERUM
Beta-2 Microglobulin: 2.23 mg/L — ABNORMAL HIGH (ref 1.01–1.73)
Beta-2 Microglobulin: 2.23 mg/L — ABNORMAL HIGH (ref 1.01–1.73)

## 2011-07-13 NOTE — Progress Notes (Signed)
This office note has been dictated.

## 2011-07-14 NOTE — Progress Notes (Signed)
CC:   [name], M.D. Alben Deeds, MD Cecille Aver, M.D. Anselmo Rod, MD, [facility]  DIAGNOSES: 1. Lymphoplasmacytic lymphoma, clinical remission. 2. Systemic lupus. 3. Anemia secondary to renal insufficiency.  CURRENT THERAPY:  Observation.  INTERIM HISTORY:  [name] comes in for follow-up.  She is doing well. She says her appetite is not that good.  I am not too sure as to why that would be.  We have had no evidence of recurrence of her lymphoma.  When we last saw her in September, her monoclonal spike was negative.  She had a normal LDH.  She has had no cough or shortness of breath.  There has been no change in bowel or bladder habits.  She has gotten through her foot surgery.  Her husband unfortunately did have surgery recently because of a health issue.  PHYSICAL EXAMINATION:  General Appearance:  This is a well-developed well-nourished black female in no obvious distress.  Vital Signs:  97.2, pulse 80, respiratory rate 16, blood pressure 140/80.  Weight is 181. Head and Neck Exam:  Shows a normocephalic, atraumatic skull.  There are no ocular or oral lesions.  There are no palpable cervical or supraclavicular lymph nodes.  Lungs:  Clear bilaterally.  Cardiac Exam: Regular rate and rhythm with a normal S1 and S2.  There are no murmurs, rubs or bruits.  Abdominal Exam:  Soft with good bowel sounds.  There is no palpable abdominal mass.  There is no fluid wave.  There is no palpable hepatosplenomegaly.  Back Exam:  No tenderness over the spine, ribs or hips.  Extremities:  Show no clubbing, cyanosis or edema.  LABORATORY STUDIES:  Show a white cell count of 5.2, hemoglobin 12.3, hematocrit 37.5, platelet count 127.  IMPRESSION:  [name] is a 61 year old African American female with a past history of lymphoplasmacytic lymphoma.  She has been off treatment now for almost 3 years.  She received 6 cycles of FCR followed by maintenance Rituxan.  Her  weight is down a little bit.  To me, I do not think this is a bad thing.  I do not see that we need to do any scans on her.  Will await the results of her monoclonal studies.  We will plan to get her back in another 4 months for follow-up.    ______________________________ Josph Macho, M.D. PRE/MEDQ  D:  07/13/2011  T:  07/14/2011  Job:  [id number]

## 2011-07-18 LAB — SPEP & IFE WITH QIG
Beta 2: 4.8 % (ref 3.2–6.5)
Beta Globulin: 5.3 % (ref 4.7–7.2)
Gamma Globulin: 7.4 % — ABNORMAL LOW (ref 11.1–18.8)
IgA: 138 mg/dL (ref 69–380)
IgG (Immunoglobin G), Serum: 526 mg/dL — ABNORMAL LOW (ref 690–1700)

## 2011-07-18 LAB — COMPREHENSIVE METABOLIC PANEL
AST: 18 U/L (ref 0–37)
BUN: 15 mg/dL (ref 6–23)
CO2: 25 mEq/L (ref 19–32)
Calcium: 9 mg/dL (ref 8.4–10.5)
Chloride: 105 mEq/L (ref 96–112)
Creatinine, Ser: 1.19 mg/dL — ABNORMAL HIGH (ref 0.50–1.10)

## 2011-07-18 LAB — LACTATE DEHYDROGENASE: LDH: 111 U/L (ref 94–250)

## 2011-07-18 LAB — BETA 2 MICROGLOBULIN, SERUM: Beta-2 Microglobulin: 2.23 mg/L — ABNORMAL HIGH (ref 1.01–1.73)

## 2011-10-07 ENCOUNTER — Telehealth: Payer: Self-pay | Admitting: Hematology & Oncology

## 2011-10-07 NOTE — Telephone Encounter (Signed)
Pt called and cx 10/12/11 apt and resch for 10/18/11

## 2011-10-12 ENCOUNTER — Other Ambulatory Visit: Payer: BC Managed Care – PPO | Admitting: Lab

## 2011-10-12 ENCOUNTER — Ambulatory Visit: Payer: BC Managed Care – PPO | Admitting: Hematology & Oncology

## 2011-10-18 ENCOUNTER — Other Ambulatory Visit (HOSPITAL_BASED_OUTPATIENT_CLINIC_OR_DEPARTMENT_OTHER): Payer: BC Managed Care – PPO | Admitting: Lab

## 2011-10-18 ENCOUNTER — Ambulatory Visit (HOSPITAL_BASED_OUTPATIENT_CLINIC_OR_DEPARTMENT_OTHER): Payer: BC Managed Care – PPO | Admitting: Hematology & Oncology

## 2011-10-18 VITALS — BP 149/84 | HR 69 | Temp 97.7°F | Ht 65.0 in | Wt 181.0 lb

## 2011-10-18 DIAGNOSIS — M329 Systemic lupus erythematosus, unspecified: Secondary | ICD-10-CM

## 2011-10-18 DIAGNOSIS — C8300 Small cell B-cell lymphoma, unspecified site: Secondary | ICD-10-CM

## 2011-10-18 DIAGNOSIS — C8589 Other specified types of non-Hodgkin lymphoma, extranodal and solid organ sites: Secondary | ICD-10-CM

## 2011-10-18 DIAGNOSIS — R5383 Other fatigue: Secondary | ICD-10-CM

## 2011-10-18 DIAGNOSIS — R5381 Other malaise: Secondary | ICD-10-CM

## 2011-10-18 LAB — CBC WITH DIFFERENTIAL (CANCER CENTER ONLY)
BASO%: 0.2 % (ref 0.0–2.0)
EOS%: 1.1 % (ref 0.0–7.0)
LYMPH#: 1.9 10*3/uL (ref 0.9–3.3)
LYMPH%: 30.8 % (ref 14.0–48.0)
MCHC: 33.4 g/dL (ref 32.0–36.0)
MONO#: 0.7 10*3/uL (ref 0.1–0.9)
Platelets: 117 10*3/uL — ABNORMAL LOW (ref 145–400)
RDW: 13 % (ref 11.1–15.7)
WBC: 6.1 10*3/uL (ref 3.9–10.0)

## 2011-10-18 NOTE — Progress Notes (Signed)
CC:   Cecille Aver, M.D. Georgianne Fick, M.D. Alben Deeds, MD Jyothi Elsie Amis, MD, [facility]  DIAGNOSES: 1. Lymphoplasmacytic lymphoma-clinical remission. 2. Systemic lupus-on Plaquenil. 3. Anemia secondary to chronic renal insufficiency.  CURRENT THERAPY:  Observation.  INTERVAL HISTORY:  [name] comes in for followup.  She has really done well.  We last saw her back in January.  Since then, she has had no complaints.  She still does not have much of an appetite.  However, she is not losing weight.  She is a little bit tired.  She just got back from [place], [place].  She was visiting some family up there.  When we check her lymphoma studies, we have not found any evidence of a monoclonal spike in her serum.  This is usually the hallmark of disease for her.  PHYSICAL EXAM:  This is a well-developed, well-nourished black female in no obvious distress.  Vital signs:  97.7, pulse 69, respiratory rate 18, blood pressure 139/84, weight is 181.  Head/Neck:  Normocephalic, atraumatic skull.  There are no ocular or oral lesions.  There are no palpable cervical or supraclavicular lymph nodes.  Lungs:  Clear to percussion and auscultation bilaterally.  Cardiac:  Regular rate and rhythm with a normal S1, S2.  There are no murmurs, rubs or bruits. Abdomen:  Soft with good bowel sounds.  There is no palpable abdominal mass.  There is no fluid wave.  There is no palpable hepatosplenomegaly. Extremities:  No clubbing, cyanosis or edema.  Neurological:  No focal neurological deficits.  Skin:  Slightly dry skin.  There is no rashes. Neurological:  No focal neurological deficits.  LABORATORY STUDIES:  White cell count is 6.1, hemoglobin 12.3, hematocrit 36.8, platelet count 117.  IMPRESSION:  [name] is a 61 year old black female with history of lymphoplasmacytic lymphoma.  She has not had any therapy now for a good 3 years.  She was treated with systemic chemotherapy with FCR.  She  then received maintenance Rituxan.  She completed this back in March 2010.  Will go ahead and plan to get her back in 6 months.  I feel confident that this appetite is in not any indication of recurrence.  I think it may be more related to medication than anything else.  She is on quite a few medications.    ______________________________ Josph Macho, M.D. PRE/MEDQ  D:  10/18/2011  T:  10/18/2011  Job:  [id number]

## 2011-10-18 NOTE — Progress Notes (Signed)
This office note has been dictated.

## 2011-10-20 LAB — LACTATE DEHYDROGENASE: LDH: 117 U/L (ref 94–250)

## 2011-10-20 LAB — IGG, IGA, IGM: IgM, Serum: <4 mg/dL — ABNORMAL LOW (ref 52–322)

## 2011-10-20 LAB — PROTEIN ELECTROPHORESIS, SERUM, WITH REFLEX
Beta 2: 4 % (ref 3.2–6.5)
Gamma Globulin: 7.3 % — ABNORMAL LOW (ref 11.1–18.8)

## 2011-10-20 LAB — IFE INTERPRETATION

## 2011-10-20 LAB — KAPPA/LAMBDA LIGHT CHAINS: Kappa free light chain: 0.89 mg/dL (ref 0.33–1.94)

## 2011-10-20 LAB — IRON AND TIBC: TIBC: 222 ug/dL — ABNORMAL LOW (ref 250–470)

## 2011-10-26 ENCOUNTER — Telehealth: Payer: Self-pay | Admitting: *Deleted

## 2011-10-26 NOTE — Telephone Encounter (Signed)
Called patient to let her know that her labs looked good per dr. Myna Hidalgo

## 2011-10-26 NOTE — Telephone Encounter (Signed)
Message copied by Anselm Jungling on Wed Oct 26, 2011  2:42 PM ------      Message from: Arlan Organ R      Created: Thu Oct 20, 2011 10:50 AM       Call- labs look ok.

## 2011-11-07 ENCOUNTER — Telehealth: Payer: Self-pay | Admitting: *Deleted

## 2011-12-01 NOTE — Telephone Encounter (Signed)
Opened in error

## 2012-04-12 ENCOUNTER — Ambulatory Visit (HOSPITAL_BASED_OUTPATIENT_CLINIC_OR_DEPARTMENT_OTHER): Payer: BC Managed Care – PPO | Admitting: Hematology & Oncology

## 2012-04-12 ENCOUNTER — Other Ambulatory Visit (HOSPITAL_BASED_OUTPATIENT_CLINIC_OR_DEPARTMENT_OTHER): Payer: BC Managed Care – PPO | Admitting: Lab

## 2012-04-12 VITALS — BP 133/63 | HR 70 | Temp 98.4°F | Resp 16 | Ht 65.0 in | Wt 176.0 lb

## 2012-04-12 DIAGNOSIS — C8589 Other specified types of non-Hodgkin lymphoma, extranodal and solid organ sites: Secondary | ICD-10-CM

## 2012-04-12 DIAGNOSIS — IMO0002 Reserved for concepts with insufficient information to code with codable children: Secondary | ICD-10-CM

## 2012-04-12 DIAGNOSIS — M810 Age-related osteoporosis without current pathological fracture: Secondary | ICD-10-CM

## 2012-04-12 DIAGNOSIS — M329 Systemic lupus erythematosus, unspecified: Secondary | ICD-10-CM

## 2012-04-12 DIAGNOSIS — C8300 Small cell B-cell lymphoma, unspecified site: Secondary | ICD-10-CM

## 2012-04-12 LAB — CBC WITH DIFFERENTIAL (CANCER CENTER ONLY)
BASO#: 0 10e3/uL (ref 0.0–0.2)
BASO%: 0.2 % (ref 0.0–2.0)
EOS%: 1.2 % (ref 0.0–7.0)
Eosinophils Absolute: 0.1 10e3/uL (ref 0.0–0.5)
HCT: 39.4 % (ref 34.8–46.6)
HGB: 13.4 g/dL (ref 11.6–15.9)
LYMPH#: 1.8 10e3/uL (ref 0.9–3.3)
LYMPH%: 29.8 % (ref 14.0–48.0)
MCH: 32.5 pg (ref 26.0–34.0)
MCHC: 34 g/dL (ref 32.0–36.0)
MCV: 96 fL (ref 81–101)
MONO#: 0.5 10e3/uL (ref 0.1–0.9)
MONO%: 9.1 % (ref 0.0–13.0)
NEUT#: 3.6 10e3/uL (ref 1.5–6.5)
NEUT%: 59.7 % (ref 39.6–80.0)
Platelets: 133 10e3/uL — ABNORMAL LOW (ref 145–400)
RBC: 4.12 10e6/uL (ref 3.70–5.32)
RDW: 12.5 % (ref 11.1–15.7)
WBC: 5.9 10e3/uL (ref 3.9–10.0)

## 2012-04-12 NOTE — Progress Notes (Signed)
This office note has been dictated.

## 2012-04-13 NOTE — Progress Notes (Signed)
CC:   [name], M.D. Erica Deeds, MD [name], M.D.  DIAGNOSES: 1. Lymphoplasmacytic lymphoma, clinical remission. 2. Anemia secondary to chronic renal insufficiency. 3. Lupus.  CURRENT THERAPY:  Observation.  INTERIM HISTORY:  [name] comes in for followup.  She is doing fairly well.  We last saw her back in April.  She has had no problems since we last saw her.  She does have some osteoporotic issues.  She has also had some low blood pressure issues.  There has been no evidence of recurrence with respect to her lymphoma. We check her studies every time we see her and when we last saw her, there was no monoclonal spike in her serum.  Her IgM level was less than 4.  PHYSICAL EXAMINATION:  This is a well-developed, well-nourished black female in no obvious distress.  Vital signs: 98.4, pulse 70, respiratory rate 16, blood pressure 133/63.  Weight is 176.  Head and neck: Normocephalic, atraumatic skull.  There are no ocular or oral lesions. There are no palpable cervical or supraclavicular lymph nodes.  Lungs: Clear bilaterally.  Cardiac:  Regular rate and rhythm with a normal S1 and S2.  There are no murmurs, rubs or bruits.  Abdomen:  Soft with good bowel sounds.  There is no palpable abdominal mass.  There is no fluid wave.  There is no palpable hepatosplenomegaly.  Back:  No tenderness over the spine, ribs, or hips.  Extremities:  No clubbing, cyanosis or edema.  LABORATORY STUDIES:  White cell count is 5.9, hemoglobin 13.4, hematocrit 39.4, platelet count 133.  IMPRESSION:  [name] is a 61 year old black female with a history of lymphoplasmacytic lymphoma.  She was treated with systemic chemotherapy. Everything was completed back in March 2010.  I see no evidence of recurrence to date.  Her lupus is her more active issue right now.  We will go ahead and plan to get her back in 6 more months.  I do not see that we need any blood work in between  visits.  I talked to her about the possibility of Reclast for her osteoporosis. She will talk to her doctors about this.   ______________________________ Josph Macho, M.D. PRE/MEDQ  D:  04/12/2012  T:  04/13/2012  Job:  [id number]

## 2012-04-17 LAB — COMPREHENSIVE METABOLIC PANEL
ALT: 12 U/L (ref 0–35)
AST: 12 U/L (ref 0–37)
Albumin: 4.2 g/dL (ref 3.5–5.2)
Alkaline Phosphatase: 109 U/L (ref 39–117)
BUN: 14 mg/dL (ref 6–23)
Calcium: 9.2 mg/dL (ref 8.4–10.5)
Chloride: 103 mEq/L (ref 96–112)
Potassium: 3.7 mEq/L (ref 3.5–5.3)
Sodium: 143 mEq/L (ref 135–145)
Total Protein: 6.3 g/dL (ref 6.0–8.3)

## 2012-04-17 LAB — PROTEIN ELECTROPHORESIS, SERUM, WITH REFLEX
Albumin ELP: 61.5 % (ref 55.8–66.1)
Alpha-1-Globulin: 5.1 % — ABNORMAL HIGH (ref 2.9–4.9)
Alpha-2-Globulin: 15 % — ABNORMAL HIGH (ref 7.1–11.8)
Beta Globulin: 5.6 % (ref 4.7–7.2)
Total Protein, Serum Electrophoresis: 6.3 g/dL (ref 6.0–8.3)

## 2012-04-17 LAB — KAPPA/LAMBDA LIGHT CHAINS: Kappa:Lambda Ratio: 0.63 (ref 0.26–1.65)

## 2012-04-23 ENCOUNTER — Telehealth: Payer: Self-pay | Admitting: Hematology & Oncology

## 2012-04-23 NOTE — Telephone Encounter (Addendum)
Message copied by Cathi Roan on Mon Apr 23, 2012  9:55 AM ------      Message from: [place], [place]      Created: Fri Apr 20, 2012 12:34 PM                   ----- Message -----         From: Josph Macho, MD         Sent: 04/19/2012  10:30 AM           To: Nanci Pina Nurse Hp            Call - No evidence of lymphoma!!  Pete  04-23-12   9:55am   Called and spoke to pt on home phone, informed her of above MD note. Pt was relieved with results.  Lupita Raider LPN

## 2012-05-10 ENCOUNTER — Other Ambulatory Visit: Payer: Self-pay | Admitting: Internal Medicine

## 2012-05-10 DIAGNOSIS — Z1231 Encounter for screening mammogram for malignant neoplasm of breast: Secondary | ICD-10-CM

## 2012-05-18 ENCOUNTER — Ambulatory Visit
Admission: RE | Admit: 2012-05-18 | Discharge: 2012-05-18 | Disposition: A | Discharge status: Home / Self Care | Payer: BC Managed Care – PPO | Source: Ambulatory Visit | Attending: Internal Medicine | Admitting: Internal Medicine

## 2012-05-18 DIAGNOSIS — Z1231 Encounter for screening mammogram for malignant neoplasm of breast: Secondary | ICD-10-CM

## 2012-05-21 ENCOUNTER — Telehealth: Payer: Self-pay | Admitting: Hematology & Oncology

## 2012-05-21 NOTE — Telephone Encounter (Signed)
Patient called and cx 10/18/11 apt and resch for 10/25/11

## 2012-06-21 ENCOUNTER — Other Ambulatory Visit: Payer: Self-pay | Admitting: Otolaryngology

## 2012-06-21 DIAGNOSIS — H9202 Otalgia, left ear: Secondary | ICD-10-CM

## 2012-06-29 ENCOUNTER — Ambulatory Visit
Admission: RE | Admit: 2012-06-29 | Discharge: 2012-06-29 | Disposition: A | Discharge status: Home / Self Care | Payer: BC Managed Care – PPO | Source: Ambulatory Visit | Attending: Otolaryngology | Admitting: Otolaryngology

## 2012-06-29 DIAGNOSIS — H9202 Otalgia, left ear: Secondary | ICD-10-CM

## 2012-08-23 ENCOUNTER — Emergency Department (HOSPITAL_COMMUNITY)
Admission: EM | Admit: 2012-08-23 | Discharge: 2012-08-23 | Disposition: A | Discharge status: Home / Self Care | Payer: BC Managed Care – PPO | Attending: Emergency Medicine | Admitting: Emergency Medicine

## 2012-08-23 ENCOUNTER — Encounter (HOSPITAL_COMMUNITY): Payer: Self-pay | Admitting: Emergency Medicine

## 2012-08-23 DIAGNOSIS — Z79899 Other long term (current) drug therapy: Secondary | ICD-10-CM | POA: Insufficient documentation

## 2012-08-23 DIAGNOSIS — J029 Acute pharyngitis, unspecified: Secondary | ICD-10-CM | POA: Insufficient documentation

## 2012-08-23 DIAGNOSIS — L299 Pruritus, unspecified: Secondary | ICD-10-CM | POA: Insufficient documentation

## 2012-08-23 DIAGNOSIS — B029 Zoster without complications: Secondary | ICD-10-CM

## 2012-08-23 DIAGNOSIS — Z8673 Personal history of transient ischemic attack (TIA), and cerebral infarction without residual deficits: Secondary | ICD-10-CM | POA: Insufficient documentation

## 2012-08-23 DIAGNOSIS — Z87898 Personal history of other specified conditions: Secondary | ICD-10-CM | POA: Insufficient documentation

## 2012-08-23 DIAGNOSIS — R220 Localized swelling, mass and lump, head: Secondary | ICD-10-CM | POA: Insufficient documentation

## 2012-08-23 DIAGNOSIS — Z7982 Long term (current) use of aspirin: Secondary | ICD-10-CM | POA: Insufficient documentation

## 2012-08-23 DIAGNOSIS — K219 Gastro-esophageal reflux disease without esophagitis: Secondary | ICD-10-CM | POA: Insufficient documentation

## 2012-08-23 DIAGNOSIS — J45909 Unspecified asthma, uncomplicated: Secondary | ICD-10-CM | POA: Insufficient documentation

## 2012-08-23 DIAGNOSIS — L930 Discoid lupus erythematosus: Secondary | ICD-10-CM | POA: Insufficient documentation

## 2012-08-23 DIAGNOSIS — I10 Essential (primary) hypertension: Secondary | ICD-10-CM | POA: Insufficient documentation

## 2012-08-23 DIAGNOSIS — R5381 Other malaise: Secondary | ICD-10-CM | POA: Insufficient documentation

## 2012-08-23 HISTORY — DX: Unspecified asthma, uncomplicated: J45.909

## 2012-08-23 HISTORY — DX: Gastro-esophageal reflux disease without esophagitis: K21.9

## 2012-08-23 HISTORY — DX: Essential (primary) hypertension: I10

## 2012-08-23 HISTORY — DX: Cerebral infarction, unspecified: I63.9

## 2012-08-23 MED ORDER — VALACYCLOVIR HCL 1 G PO TABS: 1000.0000 mg | ORAL_TABLET | Freq: Three times a day (TID) | ORAL | Status: AC

## 2012-08-23 MED ORDER — HYDROCODONE-ACETAMINOPHEN 5-325 MG PO TABS: 1.0000 | ORAL_TABLET | Freq: Four times a day (QID) | ORAL | Status: DC | PRN

## 2012-08-23 NOTE — ED Provider Notes (Signed)
Shelda Jakes, MD  I saw and evaluated the patient, reviewed the resident's note and I agree with the findings and plan.  Patient seen by me. Onset of symptoms for the patient was on Sunday. Clinical findings are consistent with herpes zoster shingles in the left maxillary nerve branch on the face. Patient's had a history of shingles before occurred on the abdomen. Patient states it does remind her of that with blisters. Patient is immunocompromised due to her lupus and lymphoma metastases in remission. Patient will be started on Valtrex and pain medicine. Patient will followup with her primary care doctor in a few days and when this heals recommended she ask her primary care Dr. about the shingles vaccination. There is no eye involvement.  Shelda Jakes, MD 08/23/12 [phone]

## 2012-08-23 NOTE — ED Provider Notes (Signed)
History     CSN: [id number]  Arrival date & time 08/23/12  6295   First MD Initiated Contact with Patient 08/23/12 0759      No chief complaint on file.  HPI Pt is a 62 yo F with PMH of lupus, lymphoma (in remission) and HTN presenting with complaint of face rash x4 days. Pt states on Sunday she had 2 painful white bumps on her lips, the following day she had itching of her left cheek and later noticed a bump on that location. She then had itching at her hairline on the left, and developed bumps there as well. Since this morning she has had left sided facial swelling, oozing from the previous blisters as well as appearance of blisters on her upper lips. She states she has a dull pain of her entire left jaw and some tenderness of her upper gums. She denies sick contacts, new lotions/creams/cleansers, no recent travel or plant exposure. She has never had anything like this before. Denies history of HSV1. She does not remember getting Zostavax.  Past Medical History  Diagnosis Date  . 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 Lupus 07/13/2011  . Asthma   . Acid reflux   . Hypertension   . Stroke     No past surgical history on file.  No family history on file.  History  Substance Use Topics  . Smoking status: Never Smoker   . Smokeless tobacco: Not on file  . Alcohol Use: Yes    OB History   Grav Para Term Preterm Abortions TAB SAB Ect Mult Living                  Review of Systems  Constitutional: Positive for fatigue. Negative for fever and chills.  HENT: Positive for sore throat. Negative for congestion and neck stiffness.   Eyes: Negative for visual disturbance.  Respiratory: Negative for shortness of breath.   Cardiovascular: Negative for chest pain.  Gastrointestinal: Negative for abdominal pain.  Genitourinary: Negative for difficulty urinating.  Skin: Positive for rash.  All other systems reviewed and are negative.    Allergies  Ciprofloxacin;  E-mycin; and Oxycodone  Home Medications   Current Outpatient Rx  Name  Route  Sig  Dispense  Refill  . aspirin 81 MG tablet   Oral   Take 81 mg by mouth daily.          . calcium carbonate (OS-CAL) 600 MG TABS   Oral   Take 600 mg by mouth daily.         . cholecalciferol (VITAMIN D) 1000 UNITS tablet   Oral   Take 2,000 Units by mouth daily.         . furosemide (LASIX) 40 MG tablet   Oral   Take 40 mg by mouth daily.         . hydroxychloroquine (PLAQUENIL) 200 MG tablet   Oral   Take 400 mg by mouth daily.          . Multiple Vitamin (MULTIVITAMIN) tablet   Oral   Take 1 tablet by mouth daily.         . pantoprazole (PROTONIX) 40 MG tablet   Oral   Take 40 mg by mouth daily.         . potassium chloride SA (K-DUR,KLOR-CON) 20 MEQ tablet   Oral   Take 40 mEq by mouth 3 (three) times daily.          . predniSONE (DELTASONE)  5 MG tablet   Oral   Take 5 mg by mouth daily.         . Probiotic Product (PROBIOTIC PO)   Oral   Take 1 capsule by mouth daily.         . Psyllium (METAMUCIL) WAFR   Oral   Take 1 Wafer by mouth daily.         . ranitidine (ZANTAC) 300 MG tablet   Oral   Take 300 mg by mouth daily as needed for heartburn.          Marland Kitchen HYDROcodone-acetaminophen (NORCO/VICODIN) 5-325 MG per tablet   Oral   Take 1 tablet by mouth every 6 (six) hours as needed for pain.   30 tablet   0   . valACYclovir (VALTREX) 1000 MG tablet   Oral   Take 1 tablet (1,000 mg total) by mouth 3 (three) times daily.   21 tablet   0     BP 146/78  Pulse 64  Temp(Src) 98.5 F (36.9 C) (Oral)  Resp 18  SpO2 99%  Physical Exam  Constitutional: She appears well-developed and well-nourished. No distress.  HENT:  Head: Normocephalic and atraumatic.  Nose: Nose normal.  Mouth/Throat: Mucous membranes are normal. Oropharyngeal exudate (One large plaque slightly left of midline on soft palate. Also noted to have some white areas around  gumline bilaterally) and posterior oropharyngeal erythema present. No tonsillar abscesses.  Eyes: Conjunctivae and EOM are normal. Pupils are equal, round, and reactive to light. Right eye exhibits no discharge. Left eye exhibits no discharge.  Neck: Normal range of motion.  Cardiovascular: Normal rate, regular rhythm and normal heart sounds.   Pulmonary/Chest: Effort normal and breath sounds normal.  Abdominal: Soft. There is no tenderness.  Lymphadenopathy:    She has no cervical adenopathy.  Skin: Skin is warm and dry. Rash noted. Rash is vesicular.  Left hairline with multiple oozing vesicles, TTP. Left cheek with 1.5cm crusted lesion with yellow clear discharge with surrounding erythema. 4 blistered vesicles on upper lip, not ruptured. Diffuse swelling of left cheek and left upper lip, without appreciable redness except around lesions noted above.    ED Course  Procedures (including critical care time)  Labs Reviewed - No data to display No results found.   1. Herpes zoster     MDM  62 yo F presenting with face rash  Distribution of rash seems to be in Maxillary branch of trigeminal nerve. Given the current symptoms, it is likely be secondary to zoster. This is slightly more concerning given the face involvement and her immunosuppressed state. Will treat with Valtrex and oral analgesics. Pt advised to avoid young children who may not have been vaccinated against varicella. She will follow up with her PCP. Pt agrees wit this plan  Hilarie Fredrickson, MD 08/23/12 1010

## 2012-08-23 NOTE — ED Notes (Signed)
Bump on left side of jaw that is red and swelling since Sunday now has blisters on left side of face

## 2012-08-23 NOTE — ED Notes (Signed)
Family at bedside. 

## 2012-10-17 ENCOUNTER — Ambulatory Visit: Payer: BC Managed Care – PPO | Admitting: Hematology & Oncology

## 2012-10-17 ENCOUNTER — Other Ambulatory Visit: Payer: BC Managed Care – PPO | Admitting: Lab

## 2012-10-24 ENCOUNTER — Ambulatory Visit (HOSPITAL_BASED_OUTPATIENT_CLINIC_OR_DEPARTMENT_OTHER): Payer: BC Managed Care – PPO | Admitting: Hematology & Oncology

## 2012-10-24 ENCOUNTER — Ambulatory Visit (HOSPITAL_BASED_OUTPATIENT_CLINIC_OR_DEPARTMENT_OTHER): Payer: BC Managed Care – PPO | Admitting: Lab

## 2012-10-24 VITALS — BP 146/70 | HR 63 | Temp 98.0°F | Resp 16 | Ht 65.0 in | Wt 194.0 lb

## 2012-10-24 DIAGNOSIS — M810 Age-related osteoporosis without current pathological fracture: Secondary | ICD-10-CM

## 2012-10-24 DIAGNOSIS — M329 Systemic lupus erythematosus, unspecified: Secondary | ICD-10-CM

## 2012-10-24 DIAGNOSIS — Z87898 Personal history of other specified conditions: Secondary | ICD-10-CM

## 2012-10-24 DIAGNOSIS — C8300 Small cell B-cell lymphoma, unspecified site: Secondary | ICD-10-CM

## 2012-10-24 LAB — CBC WITH DIFFERENTIAL (CANCER CENTER ONLY)
BASO#: 0 10*3/uL (ref 0.0–0.2)
EOS%: 1.7 % (ref 0.0–7.0)
HCT: 37.6 % (ref 34.8–46.6)
HGB: 12.3 g/dL (ref 11.6–15.9)
LYMPH#: 1.9 10*3/uL (ref 0.9–3.3)
LYMPH%: 35.5 % (ref 14.0–48.0)
MCHC: 32.7 g/dL (ref 32.0–36.0)
MCV: 97 fL (ref 81–101)
MONO#: 0.5 10*3/uL (ref 0.1–0.9)
NEUT%: 52.7 % (ref 39.6–80.0)

## 2012-10-24 NOTE — Progress Notes (Signed)
This office note has been dictated.

## 2012-10-25 NOTE — Progress Notes (Signed)
CC:   Georgianne Fick, M.D. Alben Deeds, MD Cecille Aver, M.D.  DIAGNOSES: 1. History of lymphoplasmacytic lymphoma-clinical remission. 2. Anemia secondary to renal insufficiency. 3. Systemic lupus.  CURRENT THERAPY:  Observation.  INTERIM HISTORY:  [name] comes in for followup.  We see her every 6 months.  She is doing okay.  She does have a brace on her right ankle. She apparently has either some tendon injury or some joint problems down there.  She has seen an orthopedic surgeon for this.  As far as her lymphoma goes, she is in remission.  To me, I suspect that she is probably cured.  We completed all of her treatments back in March 2010.  When we last saw her in October of last year, there was no monoclonal spike in her serum.  Her IgM level was less than 5.  She has had no cough or shortness breath.  She has had no change in bowel or bladder habits.  Her last mammogram that we have was back in November 2013.  Everything looked good on the mammogram.  PHYSICAL EXAMINATION:  General:  This is a well-developed, well- nourished African American female in no obvious distress.  Vital signs: Temperature of 98, pulse 63, respiratory rate 16, blood pressure 146/70. Weight is 194.  Head and neck:  Normocephalic, atraumatic skull.  There are no ocular or oral lesions.  There are no palpable cervical or supraclavicular lymph nodes.  Lungs:  Clear bilaterally.  Cardiac: Regular rate and rhythm with a normal S1 and S2.  There are no murmurs, rubs, or bruits.  Abdomen:  Soft with good bowel sounds.  There is no palpable abdominal mass.  There is no palpable hepatosplenomegaly. Extremities:  No clubbing, cyanosis, or edema.  She does have the brace on the right ankle.  Again, no edema is noted in her legs.  Skin:  No rashes, ecchymosis, or petechia.  LABORATORY STUDIES:  White cell count is 5.4, hemoglobin 12.3, hematocrit 37.6, platelet count 118.  IMPRESSION:  Ms.  Atayde is a very nice 62 year old African American female with a history of lymphoplasmacytic lymphoma.  Again, she was treated with systemic chemotherapy with FCR.  She then got maintenance Rituxan. She completed all of her treatments back in March 2010.  We will go ahead and plan to get her back in another 6 months.  I have encouraged her to take vitamin D.  I do not think that should be much of a problem for her.  Her renal function has been doing quite well.    ______________________________ Josph Macho, M.D. PRE/MEDQ  D:  10/24/2012  T:  10/25/2012  Job:  [id number]

## 2012-10-26 LAB — KAPPA/LAMBDA LIGHT CHAINS
Kappa free light chain: 1.17 mg/dL (ref 0.33–1.94)
Kappa:Lambda Ratio: 0.65 (ref 0.26–1.65)

## 2012-10-26 LAB — PROTEIN ELECTROPHORESIS, SERUM, WITH REFLEX
Beta 2: 4.4 % (ref 3.2–6.5)
Beta Globulin: 6.3 % (ref 4.7–7.2)
Gamma Globulin: 7.6 % — ABNORMAL LOW (ref 11.1–18.8)

## 2012-10-26 LAB — COMPREHENSIVE METABOLIC PANEL
BUN: 12 mg/dL (ref 6–23)
CO2: 27 mEq/L (ref 19–32)
Calcium: 8.7 mg/dL (ref 8.4–10.5)
Chloride: 106 mEq/L (ref 96–112)
Creatinine, Ser: 0.85 mg/dL (ref 0.50–1.10)

## 2012-10-26 LAB — IGG, IGA, IGM: IgG (Immunoglobin G), Serum: 487 mg/dL — ABNORMAL LOW (ref 690–1700)

## 2012-10-30 ENCOUNTER — Telehealth: Payer: Self-pay

## 2012-10-30 NOTE — Telephone Encounter (Addendum)
Message copied by Leana Gamer on Tue Oct 30, 2012  4:40 PM ------      Message from: Josph Macho      Created: Sun Oct 28, 2012  8:30 PM       Call - labs are still (-) for any lymphoma.  Pete ------ Talk to patient regarding her lab result. Pt happy with her result.

## 2013-01-14 ENCOUNTER — Telehealth: Payer: Self-pay | Admitting: Hematology & Oncology

## 2013-01-14 ENCOUNTER — Other Ambulatory Visit: Payer: Self-pay | Admitting: *Deleted

## 2013-01-14 DIAGNOSIS — R110 Nausea: Secondary | ICD-10-CM

## 2013-01-14 DIAGNOSIS — R197 Diarrhea, unspecified: Secondary | ICD-10-CM

## 2013-01-14 DIAGNOSIS — C8300 Small cell B-cell lymphoma, unspecified site: Secondary | ICD-10-CM

## 2013-01-14 NOTE — Telephone Encounter (Signed)
Pt called wanted appointment, transferred to RN to triage. Pt aware of 7-15 appointment

## 2013-01-15 ENCOUNTER — Other Ambulatory Visit (HOSPITAL_BASED_OUTPATIENT_CLINIC_OR_DEPARTMENT_OTHER): Payer: BC Managed Care – PPO | Admitting: Lab

## 2013-01-15 ENCOUNTER — Ambulatory Visit (HOSPITAL_BASED_OUTPATIENT_CLINIC_OR_DEPARTMENT_OTHER): Payer: BC Managed Care – PPO | Admitting: Medical

## 2013-01-15 VITALS — BP 149/63 | HR 60 | Temp 97.8°F | Resp 16 | Ht 65.0 in | Wt 195.0 lb

## 2013-01-15 DIAGNOSIS — R197 Diarrhea, unspecified: Secondary | ICD-10-CM

## 2013-01-15 DIAGNOSIS — C8589 Other specified types of non-Hodgkin lymphoma, extranodal and solid organ sites: Secondary | ICD-10-CM

## 2013-01-15 DIAGNOSIS — C8300 Small cell B-cell lymphoma, unspecified site: Secondary | ICD-10-CM

## 2013-01-15 DIAGNOSIS — R110 Nausea: Secondary | ICD-10-CM

## 2013-01-15 LAB — CMP (CANCER CENTER ONLY)
Alkaline Phosphatase: 95 U/L — ABNORMAL HIGH (ref 26–84)
Glucose, Bld: 103 mg/dL (ref 73–118)
Sodium: 144 mEq/L (ref 128–145)
Total Bilirubin: 0.8 mg/dl (ref 0.20–1.60)
Total Protein: 6.6 g/dL (ref 6.4–8.1)

## 2013-01-15 LAB — CBC WITH DIFFERENTIAL (CANCER CENTER ONLY)
Eosinophils Absolute: 0.1 10*3/uL (ref 0.0–0.5)
LYMPH%: 30.7 % (ref 14.0–48.0)
MCH: 32.1 pg (ref 26.0–34.0)
MCV: 96 fL (ref 81–101)
MONO%: 8.3 % (ref 0.0–13.0)
Platelets: 103 10*3/uL — ABNORMAL LOW (ref 145–400)
RDW: 13 % (ref 11.1–15.7)

## 2013-01-15 NOTE — Progress Notes (Signed)
DIAGNOSES: 1. History of lymphoplasmacytic lymphoma-clinical remission. 2. Anemia secondary to renal insufficiency. 3. Systemic lupus.  CURRENT THERAPY:  Observation.  INTERIM HISTORY: [name] presents today as a work-in.  She reports that intermittently for about 2 months she has had some left lower quadrant pain.  She states that this is sometimes a [place] and by diarrhea nausea and vomiting.  She was quite worried that her lymphoma had recurred and wanted to see [place].  She does not report any fevers with that she's not reporting any night sweats or palpable lymphadenopathy.  Cardiovascular she has a history of diverticulosis and she said that she does.  She has had diverticulitis before.  I advised her that it sounds more like diverticulitis then recurrence of her cancer.  I did tell her that we would be more than happy to go ahead and scan her.  She states that she would like to followup with her primary care physician first, and then if need be we can go ahead with scans.  When we last saw her back in April her IgM was less than 4 and there was no  M spike detected.  She was treated with systemic chemotherapy with FCR.  She then received maintenance Rituxan.  She completed all her treatments back in March 2010.  She reports that she has a fair appetite she denies any constipation, she denies any chest pain, shortness of breath or cough.  She denies any fevers, chills or night sweats.  She denies any obvious or abnormal bleeding.  She denies any lower leg swelling.  She denies any rashes, headaches or visual changes.  Review of Systems: Constitutional:Negative for malaise/fatigue, fever, chills, weight loss, diaphoresis, activity change, appetite change, and unexpected weight change.  HEENT: Negative for double vision, blurred vision, visual loss, ear pain, tinnitus, congestion, rhinorrhea, epistaxis sore throat or sinus disease, oral pain/lesion, tongue soreness Respiratory: Negative for cough, chest  tightness, shortness of breath, wheezing and stridor.  Cardiovascular: Negative for chest pain, palpitations, leg swelling, orthopnea, PND, DOE or claudication Gastrointestinal: Negative for nausea, vomiting, abdominal pain, diarrhea, constipation, blood in stool, melena, hematochezia, abdominal distention, anal bleeding, rectal pain, anorexia and hematemesis.  Genitourinary: Negative for dysuria, frequency, hematuria,  Musculoskeletal: Negative for myalgias, back pain, joint swelling, arthralgias and gait problem.  Skin: Negative for rash, color change, pallor and wound.  Neurological:. Negative for dizziness/light-headedness, tremors, seizures, syncope, facial asymmetry, speech difficulty, weakness, numbness, headaches and paresthesias.  Hematological: Negative for adenopathy. Does not bruise/bleed easily.  Psychiatric/Behavioral:  Negative for depression, no loss of interest in normal activity or change in sleep pattern.   Physical Exam: This is a pleasant 61 year old well-developed well-nourished African American female in no obvious distress Vitals: Temperature 97.8 degrees pulse 60 respirations 16 blood pressure 149/63 weight 195 pounds HEENT reveals a normocephalic, atraumatic skull, no scleral icterus, no oral lesions  Neck is supple without any cervical or supraclavicular adenopathy.  Lungs are clear to auscultation bilaterally. There are no wheezes, rales or rhonci Cardiac is regular rate and rhythm with a normal S1 and S2. There are no murmurs, rubs, or bruits.  Abdomen is soft with good bowel sounds, there is no palpable mass. There is no palpable hepatosplenomegaly. There is no palpable fluid wave.  Musculoskeletal no tenderness of the spine, ribs, or hips.  Extremities there are no clubbing, cyanosis, or edema.  Skin no petechia, purpura or ecchymosis Neurologic is nonfocal.  Laboratory Data: White count 5.8 hemoglobin 13.4 hematocrit 40.1 103,000  Current Outpatient  Prescriptions on File Prior to Visit  Medication Sig Dispense Refill  . aspirin 81 MG tablet Take 81 mg by mouth daily.       . calcium carbonate (OS-CAL) 600 MG TABS Take 600 mg by mouth daily.      . cholecalciferol (VITAMIN D) 1000 UNITS tablet Take 2,000 Units by mouth daily.      . furosemide (LASIX) 40 MG tablet Take 40 mg by mouth daily.      . hydroxychloroquine (PLAQUENIL) 200 MG tablet Take 400 mg by mouth daily.       Marland Kitchen lisinopril (PRINIVIL,ZESTRIL) 2.5 MG tablet Take 2.5 mg by mouth daily.       . Multiple Vitamin (MULTIVITAMIN) tablet Take 1 tablet by mouth daily.      . pantoprazole (PROTONIX) 40 MG tablet Take 40 mg by mouth daily.      . potassium chloride SA (K-DUR,KLOR-CON) 20 MEQ tablet Take 40 mEq by mouth 3 (three) times daily.       . predniSONE (DELTASONE) 5 MG tablet Take 5 mg by mouth daily.      . Probiotic Product (PROBIOTIC PO) Take 1 capsule by mouth daily.      . Psyllium (METAMUCIL) WAFR Take 1 Wafer by mouth daily.      . ranitidine (ZANTAC) 300 MG tablet Take 300 mg by mouth daily as needed for heartburn.        No current facility-administered medications on file prior to visit.   Assessment/Plan: This is a pleasant 62 year old [place] American female with the following issues:  #1.  History of lymphoplasmacytic lymphoma.  Again, she was treated with systemic chemotherapy with FCR.  She then received maintenance Rituxan.  She completed all of her treatments back in March 2010.  I do not bleed she is having a recurrence of her lymphoma.  I believe this is more diverticulitis.  She is going to followup with her primary care physician.  Her blood work looks good.  I did inform her that we can always repeat scans on her however she has deferred that for the time being.  #2.  History of diverticulitis.  I do believe she is having a flareup.  She is going to get in with her primary care physician.  I suspect she will be placed on antibiotics.  If her symptoms do not  clear up we will repeat scans.  #3.  Followup.  We will follow back up with [place] on 6 months but before then should there be questions or concerns.

## 2013-02-04 ENCOUNTER — Encounter (HOSPITAL_COMMUNITY): Payer: Self-pay | Admitting: Emergency Medicine

## 2013-02-04 ENCOUNTER — Emergency Department (INDEPENDENT_AMBULATORY_CARE_PROVIDER_SITE_OTHER): Payer: BC Managed Care – PPO

## 2013-02-04 ENCOUNTER — Emergency Department (INDEPENDENT_AMBULATORY_CARE_PROVIDER_SITE_OTHER)
Admission: EM | Admit: 2013-02-04 | Discharge: 2013-02-04 | Disposition: A | Discharge status: Home / Self Care | Payer: BC Managed Care – PPO | Source: Home / Self Care

## 2013-02-04 DIAGNOSIS — S6000XA Contusion of unspecified finger without damage to nail, initial encounter: Secondary | ICD-10-CM

## 2013-02-04 NOTE — ED Notes (Signed)
Pt reports injury to right middle finger. Still having mild pain and swelling.  Incident happened yesterday morning around 8:50 a.m Pt states that she slammed finger in door. Pt is up to date on tetanus shot.

## 2013-02-04 NOTE — ED Provider Notes (Signed)
CSN: [id number]     Arrival date & time 02/04/13  1220 History     None    Chief Complaint  Patient presents with  . Hand Injury    injury to right middle finger. finger shut in door yesterday morning around 8:50 a.m   (Consider location/radiation/quality/duration/timing/severity/associated sxs/prior Treatment) HPI Comments: 62 year old female presents for evaluation of injury to her distal right middle finger. She slammed this in the door yesterday morning at around 9:00. She is concerned because it does not seem to have stopped bleeding. She originally soaked through one Band-Aid overnight but has not yet soaked through one Band-Aid today, she has noticed a small spot of blood so she came in. It is not overly tender or painful. She denies any numbness in the finger. No history of blood dyscrasias  Patient is a 62 y.o. female presenting with hand injury.  Hand Injury Associated symptoms: no fever     Past Medical History  Diagnosis Date  . 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 Lupus 07/13/2011  . Asthma   . Acid reflux   . Hypertension   . Stroke    History reviewed. No pertinent past surgical history. History reviewed. No pertinent family history. History  Substance Use Topics  . Smoking status: Never Smoker   . Smokeless tobacco: Not on file  . Alcohol Use: Yes   OB History   Grav Para Term Preterm Abortions TAB SAB Ect Mult Living                 Review of Systems  Constitutional: Negative for fever and chills.  Eyes: Negative for visual disturbance.  Respiratory: Negative for cough and shortness of breath.   Cardiovascular: Negative for chest pain, palpitations and leg swelling.  Gastrointestinal: Negative for nausea, vomiting and abdominal pain.  Endocrine: Negative for polydipsia and polyuria.  Genitourinary: Negative for dysuria, urgency and frequency.  Musculoskeletal: Negative for myalgias and arthralgias.  Skin: Positive for wound (see history of  present illness). Negative for rash.  Neurological: Negative for dizziness, weakness and light-headedness.  Hematological: Does not bruise/bleed easily.    Allergies  Ciprofloxacin; E-mycin; and Oxycodone  Home Medications   Current Outpatient Rx  Name  Route  Sig  Dispense  Refill  . aspirin 81 MG tablet   Oral   Take 81 mg by mouth daily.          . calcium carbonate (OS-CAL) 600 MG TABS   Oral   Take 600 mg by mouth daily.         . cholecalciferol (VITAMIN D) 1000 UNITS tablet   Oral   Take 2,000 Units by mouth daily.         . furosemide (LASIX) 40 MG tablet   Oral   Take 40 mg by mouth daily.         . hydroxychloroquine (PLAQUENIL) 200 MG tablet   Oral   Take 400 mg by mouth daily.          Marland Kitchen lisinopril (PRINIVIL,ZESTRIL) 10 MG tablet   Oral   Take 10 mg by mouth daily.         . Multiple Vitamin (MULTIVITAMIN) tablet   Oral   Take 1 tablet by mouth daily.         . pantoprazole (PROTONIX) 40 MG tablet   Oral   Take 40 mg by mouth daily.         . potassium chloride SA (K-DUR,KLOR-CON) 20 MEQ tablet  Oral   Take 40 mEq by mouth 3 (three) times daily.          . predniSONE (DELTASONE) 5 MG tablet   Oral   Take 5 mg by mouth daily.         Marland Kitchen PREMARIN vaginal cream   Vaginal   Place 0.5 g vaginally 4 (four) times a week.          . Probiotic Product (PROBIOTIC PO)   Oral   Take 1 capsule by mouth daily.         . Psyllium (METAMUCIL) WAFR   Oral   Take 1 Wafer by mouth daily.         . ranitidine (ZANTAC) 300 MG tablet   Oral   Take 300 mg by mouth daily as needed for heartburn.           BP 135/57  Pulse 77  Temp(Src) 98.1 F (36.7 C) (Oral)  Resp 16  SpO2 99% Physical Exam  Constitutional: She is oriented to person, place, and time. She appears well-developed and well-nourished. No distress.  HENT:  Head: Normocephalic and atraumatic.  Musculoskeletal:       Right hand: She exhibits normal capillary  refill.       Hands: Neurological: She is alert and oriented to person, place, and time. Coordination normal.  Skin: Skin is dry. She is not diaphoretic.  Psychiatric: She has a normal mood and affect. Judgment normal.    ED Course   Procedures (including critical care time)  Labs Reviewed - No data to display Dg Finger Middle Right  02/04/2013   *RADIOLOGY REPORT*  Clinical Data: Pain post injury yesterday  RIGHT MIDDLE FINGER 2+V  Comparison: None  Findings: Three views of the right finger submitted presumably middle finger.  No acute fracture or subluxation.  Mild degenerative changes are noted with dorsal spurring at the base of distal phalanx.  IMPRESSION: No acute fracture or subluxation.  Mild dorsal spurring at the base of distal phalanx.   Original Report Authenticated By: Natasha Mead, M.D.   1. Contusion of finger of right hand, initial encounter     MDM  No evidence of fracture on x-ray. RICE, ibuprofen as needed, followup as needed  Graylon Good, PA-C 02/05/13 2052

## 2013-02-06 NOTE — ED Provider Notes (Signed)
Medical screening examination/treatment/procedure(s) were performed by a resident physician or non-physician practitioner and as the supervising physician I was immediately available for consultation/collaboration.  Clementeen Graham, MD   Rodolph Bong, MD 02/06/13 [phone]

## 2013-04-24 ENCOUNTER — Other Ambulatory Visit: Payer: BC Managed Care – PPO | Admitting: Lab

## 2013-04-24 ENCOUNTER — Ambulatory Visit: Payer: BC Managed Care – PPO | Admitting: Hematology & Oncology

## 2013-05-01 ENCOUNTER — Other Ambulatory Visit: Payer: Self-pay | Admitting: Internal Medicine

## 2013-05-01 ENCOUNTER — Other Ambulatory Visit: Payer: BC Managed Care – PPO | Admitting: Lab

## 2013-05-01 ENCOUNTER — Ambulatory Visit: Payer: BC Managed Care – PPO | Admitting: Hematology & Oncology

## 2013-05-01 DIAGNOSIS — M5416 Radiculopathy, lumbar region: Secondary | ICD-10-CM

## 2013-05-09 ENCOUNTER — Ambulatory Visit
Admission: RE | Admit: 2013-05-09 | Discharge: 2013-05-09 | Disposition: A | Discharge status: Home / Self Care | Payer: BC Managed Care – PPO | Source: Ambulatory Visit | Attending: Internal Medicine | Admitting: Internal Medicine

## 2013-05-09 DIAGNOSIS — M5416 Radiculopathy, lumbar region: Secondary | ICD-10-CM

## 2013-05-27 ENCOUNTER — Other Ambulatory Visit: Payer: Self-pay | Admitting: Internal Medicine

## 2013-05-27 DIAGNOSIS — IMO0002 Reserved for concepts with insufficient information to code with codable children: Secondary | ICD-10-CM

## 2013-06-03 ENCOUNTER — Ambulatory Visit
Admission: RE | Admit: 2013-06-03 | Discharge: 2013-06-03 | Disposition: A | Discharge status: Home / Self Care | Payer: BC Managed Care – PPO | Source: Ambulatory Visit | Attending: Internal Medicine | Admitting: Internal Medicine

## 2013-06-03 VITALS — BP 154/74 | HR 73

## 2013-06-03 DIAGNOSIS — IMO0002 Reserved for concepts with insufficient information to code with codable children: Secondary | ICD-10-CM

## 2013-06-03 MED ORDER — METHYLPREDNISOLONE ACETATE 40 MG/ML INJ SUSP (RADIOLOG
120.0000 mg | Freq: Once | INTRAMUSCULAR | Status: AC
  Administered 2013-06-03: 120 mg via EPIDURAL

## 2013-06-03 MED ORDER — IOHEXOL 180 MG/ML  SOLN
1.0000 mL | Freq: Once | INTRAMUSCULAR | Status: AC | PRN
  Administered 2013-06-03: 1 mL via EPIDURAL

## 2013-06-17 ENCOUNTER — Other Ambulatory Visit: Payer: Self-pay | Admitting: Internal Medicine

## 2013-06-17 DIAGNOSIS — M549 Dorsalgia, unspecified: Secondary | ICD-10-CM

## 2013-06-21 ENCOUNTER — Ambulatory Visit
Admission: RE | Admit: 2013-06-21 | Discharge: 2013-06-21 | Disposition: A | Discharge status: Home / Self Care | Payer: BC Managed Care – PPO | Source: Ambulatory Visit | Attending: Internal Medicine | Admitting: Internal Medicine

## 2013-06-21 DIAGNOSIS — M549 Dorsalgia, unspecified: Secondary | ICD-10-CM

## 2013-06-21 MED ORDER — METHYLPREDNISOLONE ACETATE 40 MG/ML INJ SUSP (RADIOLOG
120.0000 mg | Freq: Once | INTRAMUSCULAR | Status: AC
  Administered 2013-06-21: 120 mg via EPIDURAL

## 2013-06-21 MED ORDER — IOHEXOL 180 MG/ML  SOLN
1.0000 mL | Freq: Once | INTRAMUSCULAR | Status: AC | PRN
  Administered 2013-06-21: 1 mL via EPIDURAL

## 2013-07-18 ENCOUNTER — Encounter: Payer: Self-pay | Admitting: Hematology & Oncology

## 2013-07-18 ENCOUNTER — Ambulatory Visit (HOSPITAL_BASED_OUTPATIENT_CLINIC_OR_DEPARTMENT_OTHER): Payer: BC Managed Care – PPO | Admitting: Hematology & Oncology

## 2013-07-18 ENCOUNTER — Ambulatory Visit (HOSPITAL_BASED_OUTPATIENT_CLINIC_OR_DEPARTMENT_OTHER): Payer: BC Managed Care – PPO | Admitting: Lab

## 2013-07-18 VITALS — BP 140/60 | HR 76 | Temp 98.1°F | Resp 14 | Ht 65.0 in | Wt 201.0 lb

## 2013-07-18 DIAGNOSIS — Z87898 Personal history of other specified conditions: Secondary | ICD-10-CM

## 2013-07-18 DIAGNOSIS — C8299 Follicular lymphoma, unspecified, extranodal and solid organ sites: Secondary | ICD-10-CM

## 2013-07-18 DIAGNOSIS — C8300 Small cell B-cell lymphoma, unspecified site: Secondary | ICD-10-CM

## 2013-07-18 LAB — CBC WITH DIFFERENTIAL (CANCER CENTER ONLY)
BASO#: 0 10*3/uL (ref 0.0–0.2)
BASO%: 0.2 % (ref 0.0–2.0)
EOS%: 0.7 % (ref 0.0–7.0)
Eosinophils Absolute: 0.1 10*3/uL (ref 0.0–0.5)
HEMATOCRIT: 39.5 % (ref 34.8–46.6)
HEMOGLOBIN: 12.8 g/dL (ref 11.6–15.9)
LYMPH#: 2.2 10*3/uL (ref 0.9–3.3)
LYMPH%: 26.6 % (ref 14.0–48.0)
MCH: 31.4 pg (ref 26.0–34.0)
MCHC: 32.4 g/dL (ref 32.0–36.0)
MCV: 97 fL (ref 81–101)
MONO#: 0.6 10*3/uL (ref 0.1–0.9)
MONO%: 6.6 % (ref 0.0–13.0)
NEUT#: 5.6 10*3/uL (ref 1.5–6.5)
NEUT%: 65.9 % (ref 39.6–80.0)
Platelets: 139 10*3/uL — ABNORMAL LOW (ref 145–400)
RBC: 4.08 10*6/uL (ref 3.70–5.32)
RDW: 12.9 % (ref 11.1–15.7)
WBC: 8.4 10*3/uL (ref 3.9–10.0)

## 2013-07-18 LAB — IRON AND TIBC CHCC
%SAT: 32 % (ref 21–57)
IRON: 73 ug/dL (ref 41–142)
TIBC: 230 ug/dL — ABNORMAL LOW (ref 236–444)
UIBC: 158 ug/dL (ref 120–384)

## 2013-07-18 LAB — FERRITIN CHCC: Ferritin: 420 ng/ml — ABNORMAL HIGH (ref 9–269)

## 2013-07-18 NOTE — Progress Notes (Signed)
This office note has been dictated.

## 2013-07-19 NOTE — Progress Notes (Signed)
CC:   [name], M.D.  DIAGNOSES: 1. Lymphoplasmacytic lymphoma - clinical remission. 2. Anemia secondary to renal insufficiency. 3. Systemic lupus.  CURRENT THERAPY:  Observation.  INTERIM HISTORY:  [name] comes in for followup.  I suspect that this might be her last visit with [place].  She has done well from my point of view.  She has had no evidence of recurrence.  It has now been 5 years since we last treated her.  Her weight does continue to go up.  She is trying to watch this.  She does not have any further problems with her kidneys.  Her lupus did not seem to be flaring up.  She is on Plaquenil.  When we last saw her back in April of 2014, her protein studies did not show a monoclonal spike.  Her IgM level was less than 4.  She has had no cough or shortness of breath.  She has had no change in bowel or bladder habits.  She has had no rashes.  She has had no headache.  She is overdue for mammogram.  She says that she will get this set up.  PHYSICAL EXAMINATION:  General:  This is a somewhat obese, African American female, in no obvious distress.  Vital Signs:  Temperature of 98.1, pulse 76, respiratory rate 14, blood pressure 140/60, weight is 201 pounds.  Head and Neck:  Normocephalic, atraumatic skull.  There are no ocular or oral lesions.  There are no palpable cervical or supraclavicular lymph nodes.  Lungs:  Clear bilaterally.  Cardiac: Regular rate and rhythm with a normal S1 and S2.  There are no murmurs, rubs, or bruits.  Abdomen:  Soft.  She has good bowel sounds.  There is no fluid wave.  There is no palpable hepatosplenomegaly.  Back:  No tenderness over the spine, ribs, or hips.  Extremities:  No clubbing, cyanosis, or edema.  Skin:  No rashes, ecchymoses, or petechia. Neurological:  No focal neurological deficits.  LABORATORY STUDIES:  White cell count is 8.4, hemoglobin 12.8, hematocrit 39.5, platelet count 139.  IMPRESSION:  [name] is a very  nice 63 year old African American female.  She has a history of lymphoplasmacytic lymphoma.  Again, she has done incredibly well.  We treated her with FCR.  She got into remission.  She then had maintenance Rituxan.  She completed all of her treatments back in March 2010.  Again, I do not think we need to see her back in the office.  I told her that she is not going to have any problems with recurrence of her lymphoma.  If any problems do arise, we will be more than happy to get her back to see [place].  I am just so glad that she has done so well.    ______________________________ Volanda Napoleon, M.D. PRE/MEDQ  D:  07/18/2013  T:  07/19/2013  Job:  [id number]

## 2013-07-23 LAB — PROTEIN ELECTROPHORESIS, SERUM, WITH REFLEX
ALBUMIN ELP: 57.9 % (ref 55.8–66.1)
ALPHA-1-GLOBULIN: 5.5 % — AB (ref 2.9–4.9)
ALPHA-2-GLOBULIN: 16.3 % — AB (ref 7.1–11.8)
BETA 2: 4.8 % (ref 3.2–6.5)
Beta Globulin: 6.2 % (ref 4.7–7.2)
GAMMA GLOBULIN: 9.3 % — AB (ref 11.1–18.8)
Total Protein, Serum Electrophoresis: 6.2 g/dL (ref 6.0–8.3)

## 2013-07-23 LAB — COMPREHENSIVE METABOLIC PANEL
ALBUMIN: 3.9 g/dL (ref 3.5–5.2)
ALT: 11 U/L (ref 0–35)
AST: 14 U/L (ref 0–37)
Alkaline Phosphatase: 85 U/L (ref 39–117)
BUN: 16 mg/dL (ref 6–23)
CALCIUM: 8.6 mg/dL (ref 8.4–10.5)
CHLORIDE: 106 meq/L (ref 96–112)
CO2: 25 meq/L (ref 19–32)
CREATININE: 1.08 mg/dL (ref 0.50–1.10)
Glucose, Bld: 84 mg/dL (ref 70–99)
POTASSIUM: 3.4 meq/L — AB (ref 3.5–5.3)
Sodium: 145 mEq/L (ref 135–145)
Total Bilirubin: 0.5 mg/dL (ref 0.3–1.2)
Total Protein: 6.2 g/dL (ref 6.0–8.3)

## 2013-07-23 LAB — KAPPA/LAMBDA LIGHT CHAINS
Kappa free light chain: 1.7 mg/dL (ref 0.33–1.94)
Kappa:Lambda Ratio: 0.58 (ref 0.26–1.65)
LAMBDA FREE LGHT CHN: 2.93 mg/dL — AB (ref 0.57–2.63)

## 2013-07-23 LAB — IGG, IGA, IGM
IGG (IMMUNOGLOBIN G), SERUM: 611 mg/dL — AB (ref 690–1700)
IgA: 118 mg/dL (ref 69–380)

## 2013-07-23 LAB — IFE INTERPRETATION

## 2013-08-22 ENCOUNTER — Other Ambulatory Visit: Payer: Self-pay

## 2013-08-22 DIAGNOSIS — Z1231 Encounter for screening mammogram for malignant neoplasm of breast: Secondary | ICD-10-CM

## 2013-09-04 ENCOUNTER — Ambulatory Visit: Payer: BC Managed Care – PPO

## 2013-11-13 DIAGNOSIS — I10 Essential (primary) hypertension: Secondary | ICD-10-CM | POA: Diagnosis not present

## 2013-11-20 ENCOUNTER — Other Ambulatory Visit: Payer: Self-pay | Admitting: Internal Medicine

## 2013-11-20 DIAGNOSIS — I10 Essential (primary) hypertension: Secondary | ICD-10-CM | POA: Diagnosis not present

## 2013-11-20 DIAGNOSIS — R42 Dizziness and giddiness: Secondary | ICD-10-CM | POA: Diagnosis not present

## 2013-11-20 DIAGNOSIS — D696 Thrombocytopenia, unspecified: Secondary | ICD-10-CM | POA: Diagnosis not present

## 2013-11-21 DIAGNOSIS — M2040 Other hammer toe(s) (acquired), unspecified foot: Secondary | ICD-10-CM | POA: Diagnosis not present

## 2013-11-27 DIAGNOSIS — M159 Polyosteoarthritis, unspecified: Secondary | ICD-10-CM | POA: Diagnosis not present

## 2013-11-27 DIAGNOSIS — M3500 Sicca syndrome, unspecified: Secondary | ICD-10-CM | POA: Diagnosis not present

## 2013-11-27 DIAGNOSIS — M25519 Pain in unspecified shoulder: Secondary | ICD-10-CM | POA: Diagnosis not present

## 2013-11-27 DIAGNOSIS — M545 Low back pain, unspecified: Secondary | ICD-10-CM | POA: Diagnosis not present

## 2013-11-28 ENCOUNTER — Ambulatory Visit
Admission: RE | Admit: 2013-11-28 | Discharge: 2013-11-28 | Disposition: A | Discharge status: Home / Self Care | Payer: Medicare Other | Source: Ambulatory Visit | Attending: Internal Medicine | Admitting: Internal Medicine

## 2013-11-28 ENCOUNTER — Other Ambulatory Visit: Payer: Self-pay | Admitting: Internal Medicine

## 2013-11-28 DIAGNOSIS — Z87898 Personal history of other specified conditions: Secondary | ICD-10-CM | POA: Diagnosis not present

## 2013-11-28 DIAGNOSIS — R42 Dizziness and giddiness: Secondary | ICD-10-CM

## 2013-12-04 DIAGNOSIS — R269 Unspecified abnormalities of gait and mobility: Secondary | ICD-10-CM | POA: Diagnosis not present

## 2013-12-04 DIAGNOSIS — I10 Essential (primary) hypertension: Secondary | ICD-10-CM | POA: Diagnosis not present

## 2013-12-04 DIAGNOSIS — D696 Thrombocytopenia, unspecified: Secondary | ICD-10-CM | POA: Diagnosis not present

## 2013-12-10 ENCOUNTER — Ambulatory Visit
Admission: RE | Admit: 2013-12-10 | Discharge: 2013-12-10 | Disposition: A | Discharge status: Home / Self Care | Payer: Medicare Other | Source: Ambulatory Visit

## 2013-12-10 DIAGNOSIS — Z1231 Encounter for screening mammogram for malignant neoplasm of breast: Secondary | ICD-10-CM | POA: Diagnosis not present

## 2013-12-26 DIAGNOSIS — N390 Urinary tract infection, site not specified: Secondary | ICD-10-CM | POA: Diagnosis not present

## 2013-12-26 DIAGNOSIS — Z1212 Encounter for screening for malignant neoplasm of rectum: Secondary | ICD-10-CM | POA: Diagnosis not present

## 2014-01-01 DIAGNOSIS — R269 Unspecified abnormalities of gait and mobility: Secondary | ICD-10-CM | POA: Diagnosis not present

## 2014-01-07 ENCOUNTER — Other Ambulatory Visit: Payer: Self-pay | Admitting: Internal Medicine

## 2014-01-07 DIAGNOSIS — M5136 Other intervertebral disc degeneration, lumbar region: Secondary | ICD-10-CM

## 2014-01-07 DIAGNOSIS — R269 Unspecified abnormalities of gait and mobility: Secondary | ICD-10-CM | POA: Diagnosis not present

## 2014-01-09 DIAGNOSIS — R269 Unspecified abnormalities of gait and mobility: Secondary | ICD-10-CM | POA: Diagnosis not present

## 2014-01-10 ENCOUNTER — Ambulatory Visit
Admission: RE | Admit: 2014-01-10 | Discharge: 2014-01-10 | Disposition: A | Discharge status: Home / Self Care | Payer: Medicare Other | Source: Ambulatory Visit | Attending: Internal Medicine | Admitting: Internal Medicine

## 2014-01-10 VITALS — BP 165/69 | HR 68

## 2014-01-10 DIAGNOSIS — M069 Rheumatoid arthritis, unspecified: Secondary | ICD-10-CM

## 2014-01-10 DIAGNOSIS — M51369 Other intervertebral disc degeneration, lumbar region without mention of lumbar back pain or lower extremity pain: Secondary | ICD-10-CM

## 2014-01-10 DIAGNOSIS — K2100 Gastro-esophageal reflux disease with esophagitis, without bleeding: Secondary | ICD-10-CM

## 2014-01-10 DIAGNOSIS — M5136 Other intervertebral disc degeneration, lumbar region: Secondary | ICD-10-CM

## 2014-01-10 DIAGNOSIS — E669 Obesity, unspecified: Secondary | ICD-10-CM

## 2014-01-10 DIAGNOSIS — M545 Low back pain, unspecified: Secondary | ICD-10-CM | POA: Diagnosis not present

## 2014-01-10 DIAGNOSIS — IMO0002 Reserved for concepts with insufficient information to code with codable children: Secondary | ICD-10-CM

## 2014-01-10 DIAGNOSIS — J8410 Pulmonary fibrosis, unspecified: Secondary | ICD-10-CM

## 2014-01-10 DIAGNOSIS — M329 Systemic lupus erythematosus, unspecified: Secondary | ICD-10-CM

## 2014-01-10 DIAGNOSIS — C8300 Small cell B-cell lymphoma, unspecified site: Secondary | ICD-10-CM

## 2014-01-10 MED ORDER — METHYLPREDNISOLONE ACETATE 40 MG/ML INJ SUSP (RADIOLOG
120.0000 mg | Freq: Once | INTRAMUSCULAR | Status: AC
  Administered 2014-01-10: 120 mg via EPIDURAL

## 2014-01-10 MED ORDER — IOHEXOL 180 MG/ML  SOLN
1.0000 mL | Freq: Once | INTRAMUSCULAR | Status: AC | PRN
  Administered 2014-01-10: 1 mL via INTRAVENOUS

## 2014-01-10 NOTE — Discharge Instructions (Signed)

## 2014-01-13 DIAGNOSIS — R269 Unspecified abnormalities of gait and mobility: Secondary | ICD-10-CM | POA: Diagnosis not present

## 2014-01-15 DIAGNOSIS — R269 Unspecified abnormalities of gait and mobility: Secondary | ICD-10-CM | POA: Diagnosis not present

## 2014-01-22 DIAGNOSIS — R269 Unspecified abnormalities of gait and mobility: Secondary | ICD-10-CM | POA: Diagnosis not present

## 2014-01-24 DIAGNOSIS — R269 Unspecified abnormalities of gait and mobility: Secondary | ICD-10-CM | POA: Diagnosis not present

## 2014-01-27 ENCOUNTER — Ambulatory Visit
Admission: RE | Admit: 2014-01-27 | Discharge: 2014-01-27 | Disposition: A | Discharge status: Home / Self Care | Payer: Medicare Other | Source: Ambulatory Visit | Attending: Internal Medicine | Admitting: Internal Medicine

## 2014-01-27 ENCOUNTER — Other Ambulatory Visit: Payer: Self-pay | Admitting: Internal Medicine

## 2014-01-27 DIAGNOSIS — R109 Unspecified abdominal pain: Secondary | ICD-10-CM

## 2014-01-27 DIAGNOSIS — R197 Diarrhea, unspecified: Secondary | ICD-10-CM | POA: Diagnosis not present

## 2014-01-27 DIAGNOSIS — R112 Nausea with vomiting, unspecified: Secondary | ICD-10-CM

## 2014-01-27 DIAGNOSIS — R110 Nausea: Secondary | ICD-10-CM | POA: Diagnosis not present

## 2014-02-03 DIAGNOSIS — R269 Unspecified abnormalities of gait and mobility: Secondary | ICD-10-CM | POA: Diagnosis not present

## 2014-02-05 DIAGNOSIS — R197 Diarrhea, unspecified: Secondary | ICD-10-CM | POA: Diagnosis not present

## 2014-02-05 DIAGNOSIS — R269 Unspecified abnormalities of gait and mobility: Secondary | ICD-10-CM | POA: Diagnosis not present

## 2014-02-10 DIAGNOSIS — R269 Unspecified abnormalities of gait and mobility: Secondary | ICD-10-CM | POA: Diagnosis not present

## 2014-02-11 DIAGNOSIS — M2040 Other hammer toe(s) (acquired), unspecified foot: Secondary | ICD-10-CM | POA: Diagnosis not present

## 2014-02-12 DIAGNOSIS — R269 Unspecified abnormalities of gait and mobility: Secondary | ICD-10-CM | POA: Diagnosis not present

## 2014-02-17 DIAGNOSIS — Z01818 Encounter for other preprocedural examination: Secondary | ICD-10-CM | POA: Diagnosis not present

## 2014-02-18 DIAGNOSIS — M2040 Other hammer toe(s) (acquired), unspecified foot: Secondary | ICD-10-CM | POA: Diagnosis not present

## 2014-02-24 DIAGNOSIS — M2040 Other hammer toe(s) (acquired), unspecified foot: Secondary | ICD-10-CM | POA: Diagnosis not present

## 2014-02-28 DIAGNOSIS — M2040 Other hammer toe(s) (acquired), unspecified foot: Secondary | ICD-10-CM | POA: Diagnosis not present

## 2014-04-01 DIAGNOSIS — H52209 Unspecified astigmatism, unspecified eye: Secondary | ICD-10-CM | POA: Diagnosis not present

## 2014-04-01 DIAGNOSIS — Z79899 Other long term (current) drug therapy: Secondary | ICD-10-CM | POA: Diagnosis not present

## 2014-04-07 DIAGNOSIS — N390 Urinary tract infection, site not specified: Secondary | ICD-10-CM | POA: Diagnosis not present

## 2014-05-21 DIAGNOSIS — M150 Primary generalized (osteo)arthritis: Secondary | ICD-10-CM | POA: Diagnosis not present

## 2014-05-21 DIAGNOSIS — Z7952 Long term (current) use of systemic steroids: Secondary | ICD-10-CM | POA: Diagnosis not present

## 2014-05-21 DIAGNOSIS — M5136 Other intervertebral disc degeneration, lumbar region: Secondary | ICD-10-CM | POA: Diagnosis not present

## 2014-05-21 DIAGNOSIS — M3501 Sicca syndrome with keratoconjunctivitis: Secondary | ICD-10-CM | POA: Diagnosis not present

## 2014-06-04 DIAGNOSIS — I10 Essential (primary) hypertension: Secondary | ICD-10-CM | POA: Diagnosis not present

## 2014-06-04 DIAGNOSIS — Z0000 Encounter for general adult medical examination without abnormal findings: Secondary | ICD-10-CM | POA: Diagnosis not present

## 2014-06-04 DIAGNOSIS — E160 Drug-induced hypoglycemia without coma: Secondary | ICD-10-CM | POA: Diagnosis not present

## 2014-06-11 DIAGNOSIS — J4520 Mild intermittent asthma, uncomplicated: Secondary | ICD-10-CM | POA: Diagnosis not present

## 2014-06-11 DIAGNOSIS — Z780 Asymptomatic menopausal state: Secondary | ICD-10-CM | POA: Diagnosis not present

## 2014-06-11 DIAGNOSIS — M150 Primary generalized (osteo)arthritis: Secondary | ICD-10-CM | POA: Diagnosis not present

## 2014-06-11 DIAGNOSIS — D696 Thrombocytopenia, unspecified: Secondary | ICD-10-CM | POA: Diagnosis not present

## 2014-06-11 DIAGNOSIS — I10 Essential (primary) hypertension: Secondary | ICD-10-CM | POA: Diagnosis not present

## 2014-07-22 DIAGNOSIS — H8110 Benign paroxysmal vertigo, unspecified ear: Secondary | ICD-10-CM | POA: Diagnosis not present

## 2014-07-22 DIAGNOSIS — R197 Diarrhea, unspecified: Secondary | ICD-10-CM | POA: Diagnosis not present

## 2014-07-22 DIAGNOSIS — R51 Headache: Secondary | ICD-10-CM | POA: Diagnosis not present

## 2014-08-21 DIAGNOSIS — R159 Full incontinence of feces: Secondary | ICD-10-CM | POA: Diagnosis not present

## 2014-08-21 DIAGNOSIS — R194 Change in bowel habit: Secondary | ICD-10-CM | POA: Diagnosis not present

## 2014-08-21 DIAGNOSIS — R140 Abdominal distension (gaseous): Secondary | ICD-10-CM | POA: Diagnosis not present

## 2014-08-21 DIAGNOSIS — K5730 Diverticulosis of large intestine without perforation or abscess without bleeding: Secondary | ICD-10-CM | POA: Diagnosis not present

## 2014-09-04 DIAGNOSIS — K5730 Diverticulosis of large intestine without perforation or abscess without bleeding: Secondary | ICD-10-CM | POA: Diagnosis not present

## 2014-09-04 DIAGNOSIS — K580 Irritable bowel syndrome with diarrhea: Secondary | ICD-10-CM | POA: Diagnosis not present

## 2014-09-04 DIAGNOSIS — K219 Gastro-esophageal reflux disease without esophagitis: Secondary | ICD-10-CM | POA: Diagnosis not present

## 2014-10-10 ENCOUNTER — Other Ambulatory Visit: Payer: Self-pay | Admitting: Internal Medicine

## 2014-10-10 DIAGNOSIS — M5136 Other intervertebral disc degeneration, lumbar region: Secondary | ICD-10-CM

## 2014-10-13 ENCOUNTER — Ambulatory Visit
Admission: RE | Admit: 2014-10-13 | Discharge: 2014-10-13 | Disposition: A | Discharge status: Home / Self Care | Payer: Medicare Other | Source: Ambulatory Visit | Attending: Internal Medicine | Admitting: Internal Medicine

## 2014-10-13 DIAGNOSIS — M545 Low back pain: Secondary | ICD-10-CM | POA: Diagnosis not present

## 2014-10-13 DIAGNOSIS — M5136 Other intervertebral disc degeneration, lumbar region: Secondary | ICD-10-CM

## 2014-10-13 MED ORDER — METHYLPREDNISOLONE ACETATE 40 MG/ML INJ SUSP (RADIOLOG
120.0000 mg | Freq: Once | INTRAMUSCULAR | Status: AC
  Administered 2014-10-13: 120 mg via EPIDURAL

## 2014-10-13 MED ORDER — IOHEXOL 180 MG/ML  SOLN
1.0000 mL | Freq: Once | INTRAMUSCULAR | Status: AC | PRN
  Administered 2014-10-13: 1 mL via EPIDURAL

## 2014-10-13 NOTE — Discharge Instructions (Signed)

## 2014-10-21 DIAGNOSIS — I10 Essential (primary) hypertension: Secondary | ICD-10-CM | POA: Diagnosis not present

## 2014-10-21 DIAGNOSIS — M5136 Other intervertebral disc degeneration, lumbar region: Secondary | ICD-10-CM | POA: Diagnosis not present

## 2014-10-21 DIAGNOSIS — M545 Low back pain: Secondary | ICD-10-CM | POA: Diagnosis not present

## 2014-10-21 DIAGNOSIS — J4520 Mild intermittent asthma, uncomplicated: Secondary | ICD-10-CM | POA: Diagnosis not present

## 2014-11-03 DIAGNOSIS — M5416 Radiculopathy, lumbar region: Secondary | ICD-10-CM | POA: Diagnosis not present

## 2014-11-04 DIAGNOSIS — I10 Essential (primary) hypertension: Secondary | ICD-10-CM | POA: Diagnosis not present

## 2014-11-04 DIAGNOSIS — M5136 Other intervertebral disc degeneration, lumbar region: Secondary | ICD-10-CM | POA: Diagnosis not present

## 2014-11-11 DIAGNOSIS — M545 Low back pain: Secondary | ICD-10-CM | POA: Diagnosis not present

## 2014-11-14 DIAGNOSIS — M5416 Radiculopathy, lumbar region: Secondary | ICD-10-CM | POA: Diagnosis not present

## 2014-11-18 ENCOUNTER — Other Ambulatory Visit: Payer: Self-pay | Admitting: Orthopedic Surgery

## 2014-11-18 DIAGNOSIS — I10 Essential (primary) hypertension: Secondary | ICD-10-CM | POA: Diagnosis not present

## 2014-11-18 DIAGNOSIS — Z780 Asymptomatic menopausal state: Secondary | ICD-10-CM | POA: Diagnosis not present

## 2014-11-18 DIAGNOSIS — M150 Primary generalized (osteo)arthritis: Secondary | ICD-10-CM | POA: Diagnosis not present

## 2014-11-18 DIAGNOSIS — D696 Thrombocytopenia, unspecified: Secondary | ICD-10-CM | POA: Diagnosis not present

## 2014-11-19 DIAGNOSIS — M5136 Other intervertebral disc degeneration, lumbar region: Secondary | ICD-10-CM | POA: Diagnosis not present

## 2014-11-19 DIAGNOSIS — Z7952 Long term (current) use of systemic steroids: Secondary | ICD-10-CM | POA: Diagnosis not present

## 2014-11-19 DIAGNOSIS — M150 Primary generalized (osteo)arthritis: Secondary | ICD-10-CM | POA: Diagnosis not present

## 2014-11-19 DIAGNOSIS — M3501 Sicca syndrome with keratoconjunctivitis: Secondary | ICD-10-CM | POA: Diagnosis not present

## 2014-11-25 DIAGNOSIS — Z01818 Encounter for other preprocedural examination: Secondary | ICD-10-CM | POA: Diagnosis not present

## 2014-11-25 DIAGNOSIS — R1084 Generalized abdominal pain: Secondary | ICD-10-CM | POA: Diagnosis not present

## 2014-11-25 DIAGNOSIS — D696 Thrombocytopenia, unspecified: Secondary | ICD-10-CM | POA: Diagnosis not present

## 2014-11-25 DIAGNOSIS — I10 Essential (primary) hypertension: Secondary | ICD-10-CM | POA: Diagnosis not present

## 2014-12-04 ENCOUNTER — Encounter (HOSPITAL_COMMUNITY)
Admission: RE | Admit: 2014-12-04 | Discharge: 2014-12-04 | Disposition: A | Discharge status: Home / Self Care | Payer: Medicare Other | Source: Ambulatory Visit | Attending: Orthopedic Surgery | Admitting: Orthopedic Surgery

## 2014-12-04 ENCOUNTER — Ambulatory Visit (HOSPITAL_COMMUNITY)
Admission: RE | Admit: 2014-12-04 | Discharge: 2014-12-04 | Disposition: A | Discharge status: Home / Self Care | Payer: Medicare Other | Source: Ambulatory Visit | Attending: Orthopedic Surgery | Admitting: Orthopedic Surgery

## 2014-12-04 ENCOUNTER — Encounter (HOSPITAL_COMMUNITY): Payer: Self-pay

## 2014-12-04 DIAGNOSIS — Z01818 Encounter for other preprocedural examination: Secondary | ICD-10-CM | POA: Insufficient documentation

## 2014-12-04 DIAGNOSIS — I10 Essential (primary) hypertension: Secondary | ICD-10-CM | POA: Insufficient documentation

## 2014-12-04 DIAGNOSIS — Z87891 Personal history of nicotine dependence: Secondary | ICD-10-CM | POA: Diagnosis not present

## 2014-12-04 DIAGNOSIS — Z8673 Personal history of transient ischemic attack (TIA), and cerebral infarction without residual deficits: Secondary | ICD-10-CM | POA: Diagnosis not present

## 2014-12-04 HISTORY — DX: Unspecified osteoarthritis, unspecified site: M19.90

## 2014-12-04 LAB — CBC WITH DIFFERENTIAL/PLATELET
BASOS ABS: 0 10*3/uL (ref 0.0–0.1)
Basophils Relative: 0 % (ref 0–1)
Eosinophils Absolute: 0.1 10*3/uL (ref 0.0–0.7)
Eosinophils Relative: 1 % (ref 0–5)
HCT: 40.1 % (ref 36.0–46.0)
Hemoglobin: 13 g/dL (ref 12.0–15.0)
Lymphocytes Relative: 29 % (ref 12–46)
Lymphs Abs: 2.3 10*3/uL (ref 0.7–4.0)
MCH: 30.4 pg (ref 26.0–34.0)
MCHC: 32.4 g/dL (ref 30.0–36.0)
MCV: 93.7 fL (ref 78.0–100.0)
Monocytes Absolute: 0.6 10*3/uL (ref 0.1–1.0)
Monocytes Relative: 8 % (ref 3–12)
Neutro Abs: 4.8 10*3/uL (ref 1.7–7.7)
Neutrophils Relative %: 62 % (ref 43–77)
PLATELETS: 140 10*3/uL — AB (ref 150–400)
RBC: 4.28 MIL/uL (ref 3.87–5.11)
RDW: 13.2 % (ref 11.5–15.5)
WBC: 7.7 10*3/uL (ref 4.0–10.5)

## 2014-12-04 LAB — URINALYSIS, ROUTINE W REFLEX MICROSCOPIC
Bilirubin Urine: NEGATIVE
Glucose, UA: NEGATIVE mg/dL
Hgb urine dipstick: NEGATIVE
Ketones, ur: NEGATIVE mg/dL
Nitrite: NEGATIVE
PROTEIN: NEGATIVE mg/dL
Specific Gravity, Urine: 1.024 (ref 1.005–1.030)
Urobilinogen, UA: 0.2 mg/dL (ref 0.0–1.0)
pH: 5.5 (ref 5.0–8.0)

## 2014-12-04 LAB — COMPREHENSIVE METABOLIC PANEL
ALT: 13 U/L — ABNORMAL LOW (ref 14–54)
AST: 16 U/L (ref 15–41)
Albumin: 3.6 g/dL (ref 3.5–5.0)
Alkaline Phosphatase: 94 U/L (ref 38–126)
Anion gap: 9 (ref 5–15)
BILIRUBIN TOTAL: 0.7 mg/dL (ref 0.3–1.2)
BUN: 16 mg/dL (ref 6–20)
CO2: 26 mmol/L (ref 22–32)
Calcium: 8.7 mg/dL — ABNORMAL LOW (ref 8.9–10.3)
Chloride: 106 mmol/L (ref 101–111)
Creatinine, Ser: 1.04 mg/dL — ABNORMAL HIGH (ref 0.44–1.00)
GFR calc Af Amer: >60 mL/min (ref >60)
GFR, EST NON AFRICAN AMERICAN: 55 mL/min — AB (ref >60)
Glucose, Bld: 95 mg/dL (ref 65–99)
Potassium: 3.2 mmol/L — ABNORMAL LOW (ref 3.5–5.1)
SODIUM: 141 mmol/L (ref 135–145)
Total Protein: 6.3 g/dL — ABNORMAL LOW (ref 6.5–8.1)

## 2014-12-04 LAB — APTT: aPTT: 22 seconds — ABNORMAL LOW (ref 24–37)

## 2014-12-04 LAB — URINE MICROSCOPIC-ADD ON

## 2014-12-04 LAB — SURGICAL PCR SCREEN
MRSA, PCR: NEGATIVE
Staphylococcus aureus: POSITIVE — AB

## 2014-12-04 LAB — PROTIME-INR
INR: 1.06 (ref 0.00–1.49)
Prothrombin Time: 14 seconds (ref 11.6–15.2)

## 2014-12-04 LAB — TYPE AND SCREEN
ABO/RH(D): B POS
Antibody Screen: NEGATIVE

## 2014-12-10 MED ORDER — CEFAZOLIN SODIUM-DEXTROSE 2-3 GM-% IV SOLR
2.0000 g | INTRAVENOUS | Status: AC
  Administered 2014-12-11 (×2): 2 g via INTRAVENOUS
  Filled 2014-12-10: qty 50

## 2014-12-10 NOTE — H&P (Signed)
PREOPERATIVE H&P  Chief Complaint: R > L leg pain  HPI: [name] is a 64 y.o. 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 Lupus 07/13/2011  . Asthma   . Acid reflux   . Hypertension   . Stroke     lt side weakness  . Arthritis    Past Surgical History  Procedure Laterality Date  . Abdominal hysterectomy    . Mass excision      back  . Foot surgery      x2  . Cholecystectomy    . Back surgery    . Rortator      rotator cuff   History   Social History  . Marital Status: Married    Spouse Name: N/A  . Number of Children: N/A  . Years of Education: N/A   Social History Main Topics  . Smoking status: Former Smoker -- 1.00 packs/day for 10 years    Quit date: 06/17/1994  . Smokeless tobacco: Never Used  . Alcohol Use: Yes  . Drug Use: No  . Sexual Activity: Not Currently   Other Topics Concern  . Not on file   Social History Narrative   No family history on file. Allergies  Allergen Reactions  . Ciprofloxacin Itching  . E-Mycin [Erythromycin Base] Nausea And Vomiting  . Oxycodone Other (See Comments)    Hallucination.   Prior to Admission medications   Medication Sig Start Date End Date Taking? Authorizing Provider  aspirin 81 MG tablet Take 81 mg by mouth daily.    Yes Historical Provider, MD  Biotin 1000 MCG tablet Take 1,000 mcg by mouth daily.   Yes Historical Provider, MD  Calcium Carb-Cholecalciferol (CALCIUM 600 + D PO) Take 1 tablet by mouth daily.   Yes Historical Provider, MD  cholecalciferol (VITAMIN D) 1000 UNITS tablet Take 1,000 Units by mouth daily.    Yes Historical Provider, MD  cyclobenzaprine (FLEXERIL) 10  MG tablet Take 10 mg by mouth daily as needed for muscle spasms.   Yes Historical Provider, MD  diclofenac (VOLTAREN) 50 MG EC tablet Take 50 mg by mouth 2 (two) times daily as needed for mild pain or moderate pain.   Yes Historical Provider, MD  FIBER PO Take 1 tablet by mouth daily.   Yes Historical Provider, MD  furosemide (LASIX) 80 MG tablet Take 80 mg by mouth daily.   Yes Historical Provider, MD  hydroxychloroquine (PLAQUENIL) 200 MG tablet Take 400 mg by mouth daily.    Yes Historical Provider, MD  Hypromellose (ARTIFICIAL TEARS OP) Apply 1 drop to eye 4 (four) times daily as needed (for dry eyes).   Yes Historical Provider, MD  losartan (COZAAR) 100 MG tablet Take 100 mg by mouth daily.   Yes Historical Provider, MD  Multiple Vitamin (MULTIVITAMIN) tablet Take 1 tablet by mouth daily.   Yes Historical Provider, MD  pantoprazole (PROTONIX) 40 MG tablet Take 40 mg by mouth daily.   Yes Historical Provider, MD  potassium chloride SA (K-DUR,KLOR-CON) 20 MEQ tablet Take 40 mEq by mouth daily.    Yes Historical Provider, MD  predniSONE (DELTASONE) 5 MG tablet Take 5 mg by mouth daily.  Yes Historical Provider, MD  Probiotic Product (PROBIOTIC PO) Take 1 capsule by mouth daily.   Yes Historical Provider, MD  Psyllium (METAMUCIL) WAFR Take 1 Wafer by mouth daily as needed (for constipation).    Yes Historical Provider, MD  calcium carbonate (OS-CAL) 600 MG TABS Take 600 mg by mouth daily.    Historical Provider, MD  furosemide (LASIX) 40 MG tablet Take 40 mg by mouth daily.    Historical Provider, MD  losartan (COZAAR) 25 MG tablet Take 25 mg by mouth daily.  06/24/13   Historical Provider, MD  PREMARIN vaginal cream Place 0.5 g vaginally 4 (four) times a week.  01/06/13   Historical Provider, MD  ranitidine (ZANTAC) 300 MG tablet Take 300 mg by mouth daily as needed for heartburn.     Historical Provider, MD     All other systems have been reviewed and were otherwise negative with the exception  of those mentioned in the HPI and as above.  Physical Exam: There were no vitals filed for this visit.  General: Alert, no acute distress Cardiovascular: No pedal edema Respiratory: No cyanosis, no use of accessory musculature Skin: No lesions in the area of chief complaint Neurologic: Sensation intact distally Psychiatric: Patient is competent for consent with normal mood and affect Lymphatic: No axillary or cervical lymphadenopathy   Assessment/Plan: Bilateral hip and leg pain Plan for Procedure(s): POSTERIOR LUMBAR DECOMPRESSION AND FUSION 1 LEVEL   Sinclair Ship, MD 12/10/2014 3:45 PM

## 2014-12-11 ENCOUNTER — Inpatient Hospital Stay (HOSPITAL_COMMUNITY): Payer: Medicare Other

## 2014-12-11 ENCOUNTER — Encounter (HOSPITAL_COMMUNITY): Payer: Self-pay | Admitting: *Deleted

## 2014-12-11 ENCOUNTER — Encounter (HOSPITAL_COMMUNITY)
Admission: RE | Disposition: A | Discharge status: Home / Self Care | Payer: Medicare Other | Source: Ambulatory Visit | Attending: Orthopedic Surgery

## 2014-12-11 ENCOUNTER — Inpatient Hospital Stay (HOSPITAL_COMMUNITY): Payer: Medicare Other | Admitting: Anesthesiology

## 2014-12-11 ENCOUNTER — Inpatient Hospital Stay (HOSPITAL_COMMUNITY)
Admission: RE | Admit: 2014-12-11 | Discharge: 2014-12-13 | DRG: 460 | Disposition: A | Discharge status: Home / Self Care | Payer: Medicare Other | Source: Ambulatory Visit | Attending: Orthopedic Surgery | Admitting: Orthopedic Surgery

## 2014-12-11 DIAGNOSIS — I10 Essential (primary) hypertension: Secondary | ICD-10-CM | POA: Diagnosis present

## 2014-12-11 DIAGNOSIS — M5416 Radiculopathy, lumbar region: Secondary | ICD-10-CM | POA: Diagnosis present

## 2014-12-11 DIAGNOSIS — Z79899 Other long term (current) drug therapy: Secondary | ICD-10-CM

## 2014-12-11 DIAGNOSIS — M4316 Spondylolisthesis, lumbar region: Secondary | ICD-10-CM | POA: Diagnosis not present

## 2014-12-11 DIAGNOSIS — Z4889 Encounter for other specified surgical aftercare: Secondary | ICD-10-CM | POA: Diagnosis not present

## 2014-12-11 DIAGNOSIS — Z7952 Long term (current) use of systemic steroids: Secondary | ICD-10-CM

## 2014-12-11 DIAGNOSIS — Z87891 Personal history of nicotine dependence: Secondary | ICD-10-CM | POA: Diagnosis not present

## 2014-12-11 DIAGNOSIS — C8300 Small cell B-cell lymphoma, unspecified site: Secondary | ICD-10-CM | POA: Diagnosis present

## 2014-12-11 DIAGNOSIS — Z7982 Long term (current) use of aspirin: Secondary | ICD-10-CM | POA: Diagnosis not present

## 2014-12-11 DIAGNOSIS — M329 Systemic lupus erythematosus, unspecified: Secondary | ICD-10-CM | POA: Diagnosis present

## 2014-12-11 DIAGNOSIS — Z8673 Personal history of transient ischemic attack (TIA), and cerebral infarction without residual deficits: Secondary | ICD-10-CM | POA: Diagnosis not present

## 2014-12-11 DIAGNOSIS — M5410 Radiculopathy, site unspecified: Secondary | ICD-10-CM | POA: Diagnosis present

## 2014-12-11 DIAGNOSIS — M1990 Unspecified osteoarthritis, unspecified site: Secondary | ICD-10-CM | POA: Diagnosis not present

## 2014-12-11 DIAGNOSIS — M4806 Spinal stenosis, lumbar region: Secondary | ICD-10-CM | POA: Diagnosis not present

## 2014-12-11 DIAGNOSIS — M25559 Pain in unspecified hip: Secondary | ICD-10-CM | POA: Diagnosis not present

## 2014-12-11 DIAGNOSIS — Z419 Encounter for procedure for purposes other than remedying health state, unspecified: Secondary | ICD-10-CM

## 2014-12-11 DIAGNOSIS — K219 Gastro-esophageal reflux disease without esophagitis: Secondary | ICD-10-CM | POA: Diagnosis present

## 2014-12-11 DIAGNOSIS — M79606 Pain in leg, unspecified: Secondary | ICD-10-CM | POA: Diagnosis not present

## 2014-12-11 DIAGNOSIS — M79604 Pain in right leg: Secondary | ICD-10-CM | POA: Diagnosis not present

## 2014-12-11 SURGERY — POSTERIOR LUMBAR FUSION 1 LEVEL
Anesthesia: General | Laterality: Right

## 2014-12-11 MED ORDER — DEXAMETHASONE SODIUM PHOSPHATE 10 MG/ML IJ SOLN
INTRAMUSCULAR | Status: DC | PRN
  Administered 2014-12-11: 10 mg via INTRAVENOUS

## 2014-12-11 MED ORDER — POTASSIUM CHLORIDE CRYS ER 20 MEQ PO TBCR
40.0000 meq | EXTENDED_RELEASE_TABLET | Freq: Every day | ORAL | Status: DC
  Administered 2014-12-11 – 2014-12-12 (×2): 40 meq via ORAL
  Filled 2014-12-11 (×3): qty 2

## 2014-12-11 MED ORDER — ZOLPIDEM TARTRATE 5 MG PO TABS: 5.0000 mg | ORAL_TABLET | Freq: Every evening | ORAL | Status: DC | PRN

## 2014-12-11 MED ORDER — PROBIOTIC PO CAPS: ORAL_CAPSULE | Freq: Every day | ORAL | Status: DC

## 2014-12-11 MED ORDER — ADULT MULTIVITAMIN W/MINERALS CH
1.0000 | ORAL_TABLET | Freq: Every day | ORAL | Status: DC
  Administered 2014-12-11 – 2014-12-12 (×2): 1 via ORAL
  Filled 2014-12-11 (×3): qty 1

## 2014-12-11 MED ORDER — EPHEDRINE SULFATE 50 MG/ML IJ SOLN
INTRAMUSCULAR | Status: DC | PRN
  Administered 2014-12-11: 5 mg via INTRAVENOUS

## 2014-12-11 MED ORDER — PROPOFOL INFUSION 10 MG/ML OPTIME
INTRAVENOUS | Status: DC | PRN
  Administered 2014-12-11: 50 ug/kg/min via INTRAVENOUS

## 2014-12-11 MED ORDER — CEFAZOLIN SODIUM-DEXTROSE 2-3 GM-% IV SOLR
INTRAVENOUS | Status: AC
  Filled 2014-12-11: qty 50

## 2014-12-11 MED ORDER — METHYLENE BLUE 1 % INJ SOLN
INTRAMUSCULAR | Status: AC
  Filled 2014-12-11: qty 10

## 2014-12-11 MED ORDER — HYDROMORPHONE HCL 1 MG/ML IJ SOLN
0.5000 mg | INTRAMUSCULAR | Status: DC | PRN
  Administered 2014-12-11: 1 mg via INTRAVENOUS
  Filled 2014-12-11: qty 1

## 2014-12-11 MED ORDER — SACCHAROMYCES BOULARDII 250 MG PO CAPS
250.0000 mg | ORAL_CAPSULE | Freq: Every day | ORAL | Status: DC
  Administered 2014-12-11 – 2014-12-12 (×2): 250 mg via ORAL
  Filled 2014-12-11 (×3): qty 1

## 2014-12-11 MED ORDER — PROPOFOL 10 MG/ML IV BOLUS
INTRAVENOUS | Status: AC
  Filled 2014-12-11: qty 20

## 2014-12-11 MED ORDER — ONDANSETRON HCL 4 MG/2ML IJ SOLN
INTRAMUSCULAR | Status: AC
  Filled 2014-12-11: qty 2

## 2014-12-11 MED ORDER — HYDROCODONE-ACETAMINOPHEN 7.5-325 MG PO TABS: 1.0000 | ORAL_TABLET | Freq: Once | ORAL | Status: DC | PRN

## 2014-12-11 MED ORDER — FIBER PO POWD: Freq: Every day | ORAL | Status: DC

## 2014-12-11 MED ORDER — ARTIFICIAL TEARS OP OINT
TOPICAL_OINTMENT | OPHTHALMIC | Status: AC
  Filled 2014-12-11: qty 3.5

## 2014-12-11 MED ORDER — SODIUM CHLORIDE 0.9 % IJ SOLN: 3.0000 mL | INTRAMUSCULAR | Status: DC | PRN

## 2014-12-11 MED ORDER — HYDROMORPHONE HCL 1 MG/ML IJ SOLN
INTRAMUSCULAR | Status: AC
  Filled 2014-12-11: qty 1

## 2014-12-11 MED ORDER — STERILE WATER FOR INJECTION IJ SOLN
INTRAMUSCULAR | Status: AC
  Filled 2014-12-11: qty 10

## 2014-12-11 MED ORDER — DEXAMETHASONE SODIUM PHOSPHATE 10 MG/ML IJ SOLN
INTRAMUSCULAR | Status: AC
  Filled 2014-12-11: qty 1

## 2014-12-11 MED ORDER — PHENOL 1.4 % MT LIQD: 1.0000 | OROMUCOSAL | Status: DC | PRN

## 2014-12-11 MED ORDER — PROPOFOL 10 MG/ML IV BOLUS
INTRAVENOUS | Status: DC | PRN
  Administered 2014-12-11 (×2): 50 mg via INTRAVENOUS
  Administered 2014-12-11: 150 mg via INTRAVENOUS

## 2014-12-11 MED ORDER — MIDAZOLAM HCL 2 MG/2ML IJ SOLN
INTRAMUSCULAR | Status: AC
  Filled 2014-12-11: qty 2

## 2014-12-11 MED ORDER — THROMBIN 5000 UNITS EX SOLR
CUTANEOUS | Status: DC | PRN
  Administered 2014-12-11: 20000 [IU] via TOPICAL

## 2014-12-11 MED ORDER — ACETAMINOPHEN 650 MG RE SUPP: 650.0000 mg | RECTAL | Status: DC | PRN

## 2014-12-11 MED ORDER — CALCIUM POLYCARBOPHIL 625 MG PO TABS
625.0000 mg | ORAL_TABLET | Freq: Every day | ORAL | Status: DC
  Administered 2014-12-11 – 2014-12-12 (×2): 625 mg via ORAL
  Filled 2014-12-11 (×3): qty 1

## 2014-12-11 MED ORDER — SODIUM CHLORIDE 0.9 % IV SOLN
10.0000 mg | INTRAVENOUS | Status: DC | PRN
  Administered 2014-12-11: 10 ug/min via INTRAVENOUS
  Administered 2014-12-11: 50 ug/min via INTRAVENOUS

## 2014-12-11 MED ORDER — HYDROMORPHONE HCL 2 MG PO TABS
1.0000 mg | ORAL_TABLET | ORAL | Status: DC | PRN
  Administered 2014-12-11 (×2): 2 mg via ORAL
  Filled 2014-12-11 (×2): qty 1

## 2014-12-11 MED ORDER — BUPIVACAINE-EPINEPHRINE 0.25% -1:200000 IJ SOLN
INTRAMUSCULAR | Status: DC | PRN
  Administered 2014-12-11: 23 mL

## 2014-12-11 MED ORDER — SENNOSIDES-DOCUSATE SODIUM 8.6-50 MG PO TABS
1.0000 | ORAL_TABLET | Freq: Every evening | ORAL | Status: DC | PRN
  Filled 2014-12-11: qty 1

## 2014-12-11 MED ORDER — FENTANYL CITRATE (PF) 250 MCG/5ML IJ SOLN
INTRAMUSCULAR | Status: AC
  Filled 2014-12-11: qty 5

## 2014-12-11 MED ORDER — CEFAZOLIN SODIUM 1-5 GM-% IV SOLN
1.0000 g | Freq: Three times a day (TID) | INTRAVENOUS | Status: AC
  Administered 2014-12-11 (×2): 1 g via INTRAVENOUS
  Filled 2014-12-11 (×2): qty 50

## 2014-12-11 MED ORDER — SODIUM CHLORIDE 0.9 % IV SOLN: 250.0000 mL | INTRAVENOUS | Status: DC

## 2014-12-11 MED ORDER — CALCIUM CARBONATE-VITAMIN D 600-400 MG-UNIT PO TABS: ORAL_TABLET | Freq: Every morning | ORAL | Status: DC

## 2014-12-11 MED ORDER — LIDOCAINE HCL (CARDIAC) 20 MG/ML IV SOLN
INTRAVENOUS | Status: AC
  Filled 2014-12-11: qty 10

## 2014-12-11 MED ORDER — BISACODYL 5 MG PO TBEC
5.0000 mg | DELAYED_RELEASE_TABLET | Freq: Every day | ORAL | Status: DC | PRN
  Filled 2014-12-11: qty 1

## 2014-12-11 MED ORDER — LOSARTAN POTASSIUM 50 MG PO TABS
100.0000 mg | ORAL_TABLET | Freq: Every day | ORAL | Status: DC
Start: 2014-12-11 — End: 2014-12-13
  Administered 2014-12-11 – 2014-12-12 (×2): 100 mg via ORAL
  Filled 2014-12-11 (×3): qty 2

## 2014-12-11 MED ORDER — PHENYLEPHRINE 40 MCG/ML (10ML) SYRINGE FOR IV PUSH (FOR BLOOD PRESSURE SUPPORT)
PREFILLED_SYRINGE | INTRAVENOUS | Status: AC
  Filled 2014-12-11: qty 10

## 2014-12-11 MED ORDER — ARTIFICIAL TEARS OP OINT
TOPICAL_OINTMENT | OPHTHALMIC | Status: DC | PRN
  Filled 2014-12-11: qty 3.5

## 2014-12-11 MED ORDER — ALUM & MAG HYDROXIDE-SIMETH 200-200-20 MG/5ML PO SUSP: 30.0000 mL | Freq: Four times a day (QID) | ORAL | Status: DC | PRN

## 2014-12-11 MED ORDER — PREDNISONE 5 MG PO TABS
5.0000 mg | ORAL_TABLET | Freq: Every day | ORAL | Status: DC
  Administered 2014-12-11 – 2014-12-12 (×2): 5 mg via ORAL
  Filled 2014-12-11 (×3): qty 1

## 2014-12-11 MED ORDER — LACTATED RINGERS IV SOLN
INTRAVENOUS | Status: DC | PRN
  Administered 2014-12-11 (×2): via INTRAVENOUS

## 2014-12-11 MED ORDER — VECURONIUM BROMIDE 10 MG IV SOLR
INTRAVENOUS | Status: AC
  Filled 2014-12-11: qty 10

## 2014-12-11 MED ORDER — GLYCOPYRROLATE 0.2 MG/ML IJ SOLN
INTRAMUSCULAR | Status: DC | PRN
  Administered 2014-12-11: 0.6 mg via INTRAVENOUS

## 2014-12-11 MED ORDER — BUPIVACAINE HCL (PF) 0.25 % IJ SOLN
INTRAMUSCULAR | Status: AC
  Filled 2014-12-11: qty 30

## 2014-12-11 MED ORDER — SODIUM CHLORIDE 0.9 % IJ SOLN
3.0000 mL | Freq: Two times a day (BID) | INTRAMUSCULAR | Status: DC
  Administered 2014-12-11 – 2014-12-12 (×3): 3 mL via INTRAVENOUS

## 2014-12-11 MED ORDER — MENTHOL 3 MG MT LOZG
1.0000 | LOZENGE | OROMUCOSAL | Status: DC | PRN
  Filled 2014-12-11: qty 9

## 2014-12-11 MED ORDER — SUCCINYLCHOLINE CHLORIDE 20 MG/ML IJ SOLN
INTRAMUSCULAR | Status: DC | PRN
  Administered 2014-12-11: 120 mg via INTRAVENOUS

## 2014-12-11 MED ORDER — 0.9 % SODIUM CHLORIDE (POUR BTL) OPTIME
TOPICAL | Status: DC | PRN
  Administered 2014-12-11: 1000 mL

## 2014-12-11 MED ORDER — DIAZEPAM 5 MG PO TABS
5.0000 mg | ORAL_TABLET | Freq: Four times a day (QID) | ORAL | Status: DC | PRN
  Administered 2014-12-12 – 2014-12-13 (×2): 5 mg via ORAL
  Filled 2014-12-11 (×2): qty 1

## 2014-12-11 MED ORDER — ARTIFICIAL TEARS OP OINT
TOPICAL_OINTMENT | OPHTHALMIC | Status: DC | PRN
Start: 2014-12-11 — End: 2014-12-11
  Administered 2014-12-11: 1 via OPHTHALMIC

## 2014-12-11 MED ORDER — DOCUSATE SODIUM 100 MG PO CAPS
100.0000 mg | ORAL_CAPSULE | Freq: Two times a day (BID) | ORAL | Status: DC
  Administered 2014-12-11 – 2014-12-12 (×4): 100 mg via ORAL
  Filled 2014-12-11 (×3): qty 1

## 2014-12-11 MED ORDER — THROMBIN 20000 UNITS EX SOLR
CUTANEOUS | Status: AC
  Filled 2014-12-11: qty 20000

## 2014-12-11 MED ORDER — MIDAZOLAM HCL 5 MG/5ML IJ SOLN
INTRAMUSCULAR | Status: DC | PRN
  Administered 2014-12-11: 2 mg via INTRAVENOUS

## 2014-12-11 MED ORDER — HYDROXYCHLOROQUINE SULFATE 200 MG PO TABS
400.0000 mg | ORAL_TABLET | Freq: Every day | ORAL | Status: DC
  Administered 2014-12-11 – 2014-12-12 (×2): 400 mg via ORAL
  Filled 2014-12-11 (×3): qty 2

## 2014-12-11 MED ORDER — LIDOCAINE HCL (CARDIAC) 20 MG/ML IV SOLN
INTRAVENOUS | Status: DC | PRN
  Administered 2014-12-11: 100 mg via INTRAVENOUS

## 2014-12-11 MED ORDER — FUROSEMIDE 80 MG PO TABS
80.0000 mg | ORAL_TABLET | Freq: Every day | ORAL | Status: DC
  Administered 2014-12-11 – 2014-12-12 (×2): 80 mg via ORAL
  Filled 2014-12-11 (×3): qty 1

## 2014-12-11 MED ORDER — VITAMIN D3 25 MCG (1000 UNIT) PO TABS
1000.0000 [IU] | ORAL_TABLET | Freq: Every day | ORAL | Status: DC
  Administered 2014-12-11 – 2014-12-12 (×2): 1000 [IU] via ORAL
  Filled 2014-12-11 (×3): qty 1

## 2014-12-11 MED ORDER — POVIDONE-IODINE 7.5 % EX SOLN
Freq: Once | CUTANEOUS | Status: DC
  Filled 2014-12-11: qty 118

## 2014-12-11 MED ORDER — CALCIUM CARBONATE-VITAMIN D 500-200 MG-UNIT PO TABS
1.0000 | ORAL_TABLET | Freq: Every day | ORAL | Status: DC
  Administered 2014-12-12: 1 via ORAL
  Filled 2014-12-11 (×3): qty 1

## 2014-12-11 MED ORDER — BUPIVACAINE-EPINEPHRINE (PF) 0.25% -1:200000 IJ SOLN
INTRAMUSCULAR | Status: AC
  Filled 2014-12-11: qty 30

## 2014-12-11 MED ORDER — PROMETHAZINE HCL 25 MG/ML IJ SOLN
6.2500 mg | INTRAMUSCULAR | Status: DC | PRN
Start: 2014-12-11 — End: 2014-12-11

## 2014-12-11 MED ORDER — FENTANYL CITRATE (PF) 100 MCG/2ML IJ SOLN
INTRAMUSCULAR | Status: DC | PRN
  Administered 2014-12-11 (×3): 50 ug via INTRAVENOUS
  Administered 2014-12-11 (×2): 100 ug via INTRAVENOUS
  Administered 2014-12-11: 50 ug via INTRAVENOUS
  Administered 2014-12-11: 100 ug via INTRAVENOUS

## 2014-12-11 MED ORDER — SODIUM CHLORIDE 0.9 % IV SOLN: INTRAVENOUS | Status: DC

## 2014-12-11 MED ORDER — ACETAMINOPHEN 325 MG PO TABS
650.0000 mg | ORAL_TABLET | ORAL | Status: DC | PRN
  Administered 2014-12-12: 650 mg via ORAL
  Filled 2014-12-11: qty 2

## 2014-12-11 MED ORDER — NEOSTIGMINE METHYLSULFATE 10 MG/10ML IV SOLN
INTRAVENOUS | Status: DC | PRN
  Administered 2014-12-11: 5 mg via INTRAVENOUS

## 2014-12-11 MED ORDER — BIOTIN 1000 MCG PO TABS: 1000.0000 ug | ORAL_TABLET | Freq: Every day | ORAL | Status: DC

## 2014-12-11 MED ORDER — FLEET ENEMA 7-19 GM/118ML RE ENEM
1.0000 | ENEMA | Freq: Once | RECTAL | Status: AC | PRN
  Filled 2014-12-11: qty 1

## 2014-12-11 MED ORDER — ONDANSETRON HCL 4 MG/2ML IJ SOLN
4.0000 mg | INTRAMUSCULAR | Status: DC | PRN
  Administered 2014-12-11 – 2014-12-12 (×3): 4 mg via INTRAVENOUS
  Filled 2014-12-11 (×2): qty 2

## 2014-12-11 MED ORDER — HYDROMORPHONE HCL 1 MG/ML IJ SOLN
0.2500 mg | INTRAMUSCULAR | Status: DC | PRN
  Administered 2014-12-11 (×4): 0.25 mg via INTRAVENOUS

## 2014-12-11 MED ORDER — PANTOPRAZOLE SODIUM 40 MG PO TBEC
40.0000 mg | DELAYED_RELEASE_TABLET | Freq: Every day | ORAL | Status: DC
  Administered 2014-12-11 – 2014-12-12 (×2): 40 mg via ORAL
  Filled 2014-12-11 (×2): qty 1

## 2014-12-11 MED ORDER — ONDANSETRON HCL 4 MG/2ML IJ SOLN
INTRAMUSCULAR | Status: DC | PRN
  Administered 2014-12-11 (×2): 4 mg via INTRAVENOUS

## 2014-12-11 MED ORDER — VECURONIUM BROMIDE 10 MG IV SOLR
INTRAVENOUS | Status: DC | PRN
  Administered 2014-12-11: 6 mg via INTRAVENOUS

## 2014-12-11 SURGICAL SUPPLY — 87 items
APL SKNCLS STERI-STRIP NONHPOA (GAUZE/BANDAGES/DRESSINGS) ×1
BENZOIN TINCTURE PRP APPL 2/3 (GAUZE/BANDAGES/DRESSINGS) ×3 IMPLANT
BLADE SURG ROTATE 9660 (MISCELLANEOUS) IMPLANT
BUR MATCHSTICK NEURO 3.0 LAGG (BURR) ×2 IMPLANT
BUR PRESCISION 1.7 ELITE (BURR) IMPLANT
BUR ROUND PRECISION 4.0 (BURR) ×2 IMPLANT
BUR ROUND PRECISION 4.0MM (BURR) ×1
CAGE CONCORDE BULLET 9X11X27 (Cage) ×3 IMPLANT
CAGE SPNL PRLL BLT NOSE 27X9 (Cage) IMPLANT
CARTRIDGE OIL MAESTRO DRILL (MISCELLANEOUS) ×1 IMPLANT
CLOSURE STERI-STRIP 1/2X4 (GAUZE/BANDAGES/DRESSINGS) ×1
CLOSURE WOUND 1/2 X4 (GAUZE/BANDAGES/DRESSINGS) ×2
CLSR STERI-STRIP ANTIMIC 1/2X4 (GAUZE/BANDAGES/DRESSINGS) ×1 IMPLANT
CONT SPEC STER OR (MISCELLANEOUS) ×3 IMPLANT
COVER MAYO STAND STRL (DRAPES) ×6 IMPLANT
COVER SURGICAL LIGHT HANDLE (MISCELLANEOUS) ×3 IMPLANT
DIFFUSER DRILL AIR PNEUMATIC (MISCELLANEOUS) ×3 IMPLANT
DRAIN CHANNEL 15F RND FF W/TCR (WOUND CARE) IMPLANT
DRAPE C-ARM 42X72 X-RAY (DRAPES) ×3 IMPLANT
DRAPE C-ARMOR (DRAPES) ×3 IMPLANT
DRAPE POUCH INSTRU U-SHP 10X18 (DRAPES) ×3 IMPLANT
DRAPE SURG 17X23 STRL (DRAPES) ×12 IMPLANT
DURAPREP 26ML APPLICATOR (WOUND CARE) ×3 IMPLANT
ELECT BLADE 4.0 EZ CLEAN MEGAD (MISCELLANEOUS) ×3
ELECT CAUTERY BLADE 6.4 (BLADE) ×3 IMPLANT
ELECT REM PT RETURN 9FT ADLT (ELECTROSURGICAL) ×3
ELECTRODE BLDE 4.0 EZ CLN MEGD (MISCELLANEOUS) ×1 IMPLANT
ELECTRODE REM PT RTRN 9FT ADLT (ELECTROSURGICAL) ×1 IMPLANT
EVACUATOR SILICONE 100CC (DRAIN) IMPLANT
GAUZE SPONGE 4X4 12PLY STRL (GAUZE/BANDAGES/DRESSINGS) ×3 IMPLANT
GAUZE SPONGE 4X4 16PLY XRAY LF (GAUZE/BANDAGES/DRESSINGS) ×12 IMPLANT
GLOVE BIO SURGEON STRL SZ7 (GLOVE) ×3 IMPLANT
GLOVE BIO SURGEON STRL SZ8 (GLOVE) ×3 IMPLANT
GLOVE BIOGEL PI IND STRL 7.0 (GLOVE) ×1 IMPLANT
GLOVE BIOGEL PI IND STRL 8 (GLOVE) ×1 IMPLANT
GLOVE BIOGEL PI INDICATOR 7.0 (GLOVE) ×2
GLOVE BIOGEL PI INDICATOR 8 (GLOVE) ×2
GOWN STRL REUS W/ TWL LRG LVL3 (GOWN DISPOSABLE) ×2 IMPLANT
GOWN STRL REUS W/ TWL XL LVL3 (GOWN DISPOSABLE) ×1 IMPLANT
GOWN STRL REUS W/TWL LRG LVL3 (GOWN DISPOSABLE) ×6
GOWN STRL REUS W/TWL XL LVL3 (GOWN DISPOSABLE) ×3
IV CATH 14GX2 1/4 (CATHETERS) ×3 IMPLANT
KIT BASIN OR (CUSTOM PROCEDURE TRAY) ×3 IMPLANT
KIT POSITION SURG JACKSON T1 (MISCELLANEOUS) ×3 IMPLANT
KIT ROOM TURNOVER OR (KITS) ×3 IMPLANT
MARKER SKIN DUAL TIP RULER LAB (MISCELLANEOUS) ×3 IMPLANT
MIX DBX 10CC 35% BONE (Bone Implant) ×2 IMPLANT
NDL HYPO 25GX1X1/2 BEV (NEEDLE) ×1 IMPLANT
NDL SAFETY ECLIPSE 18X1.5 (NEEDLE) ×1 IMPLANT
NDL SPNL 18GX3.5 QUINCKE PK (NEEDLE) ×2 IMPLANT
NEEDLE 22X1 1/2 (OR ONLY) (NEEDLE) ×3 IMPLANT
NEEDLE BONE MARROW 8GX6 FENEST (NEEDLE) IMPLANT
NEEDLE HYPO 18GX1.5 SHARP (NEEDLE) ×3
NEEDLE HYPO 25GX1X1/2 BEV (NEEDLE) ×3 IMPLANT
NEEDLE SPNL 18GX3.5 QUINCKE PK (NEEDLE) ×6 IMPLANT
NEURO MONITORING STIM (LABOR (TRAVEL & OVERTIME)) ×2 IMPLANT
NS IRRIG 1000ML POUR BTL (IV SOLUTION) ×3 IMPLANT
OIL CARTRIDGE MAESTRO DRILL (MISCELLANEOUS) ×3
PACK LAMINECTOMY ORTHO (CUSTOM PROCEDURE TRAY) ×3 IMPLANT
PACK UNIVERSAL I (CUSTOM PROCEDURE TRAY) ×3 IMPLANT
PAD ARMBOARD 7.5X6 YLW CONV (MISCELLANEOUS) ×6 IMPLANT
PATTIES SURGICAL .5 X1 (DISPOSABLE) ×3 IMPLANT
PATTIES SURGICAL .5X1.5 (GAUZE/BANDAGES/DRESSINGS) ×3 IMPLANT
ROD PRE BENT EXPEDIUM 35MM (Rod) ×4 IMPLANT
SCREW SET SINGLE INNER (Screw) ×8 IMPLANT
SCREW VIPER CORT FIX 5.00X40 (Screw) ×8 IMPLANT
SPONGE INTESTINAL PEANUT (DISPOSABLE) ×3 IMPLANT
SPONGE LAP 4X18 X RAY DECT (DISPOSABLE) ×4 IMPLANT
SPONGE SURGIFOAM ABS GEL 100 (HEMOSTASIS) ×3 IMPLANT
STRIP CLOSURE SKIN 1/2X4 (GAUZE/BANDAGES/DRESSINGS) ×4 IMPLANT
SURGIFLO TRUKIT (HEMOSTASIS) IMPLANT
SUT MNCRL AB 4-0 PS2 18 (SUTURE) ×6 IMPLANT
SUT VIC AB 0 CT1 18XCR BRD 8 (SUTURE) ×1 IMPLANT
SUT VIC AB 0 CT1 8-18 (SUTURE) ×3
SUT VIC AB 1 CT1 18XCR BRD 8 (SUTURE) ×2 IMPLANT
SUT VIC AB 1 CT1 8-18 (SUTURE) ×6
SUT VIC AB 2-0 CT2 18 VCP726D (SUTURE) ×3 IMPLANT
SYR 20CC LL (SYRINGE) ×3 IMPLANT
SYR BULB IRRIGATION 50ML (SYRINGE) ×3 IMPLANT
SYR CONTROL 10ML LL (SYRINGE) ×6 IMPLANT
SYR TB 1ML LUER SLIP (SYRINGE) ×3 IMPLANT
TAPE CLOTH SURG 4X10 WHT LF (GAUZE/BANDAGES/DRESSINGS) ×2 IMPLANT
TOWEL OR 17X24 6PK STRL BLUE (TOWEL DISPOSABLE) ×3 IMPLANT
TOWEL OR 17X26 10 PK STRL BLUE (TOWEL DISPOSABLE) ×3 IMPLANT
TRAY FOLEY CATH 16FRSI W/METER (SET/KITS/TRAYS/PACK) ×3 IMPLANT
WATER STERILE IRR 1000ML POUR (IV SOLUTION) ×3 IMPLANT
YANKAUER SUCT BULB TIP NO VENT (SUCTIONS) ×3 IMPLANT

## 2014-12-11 NOTE — Op Note (Signed)
NAMEDORTHY, MAGNUSSEN NO.:  [id number]  MEDICAL RECORD NO.:  [id number]  LOCATION:  3C09C                        FACILITY:  [facility]  PHYSICIAN:  Phylliss Bob, MD      DATE OF BIRTH:  1950-08-10  DATE OF PROCEDURE:  12/11/2014                              OPERATIVE REPORT   PREOPERATIVE DIAGNOSIS: 1. L4-5 bilateral lateral recess stenosis, severe. 2. Grade 1 L4-5 spondylolisthesis.  3. Bilateral lumbar radiculopathy.  POSTOPERATIVE DIAGNOSIS: 1. L4-5 bilateral lateral recess stenosis, severe. 2. Grade 1 L4-5 spondylolisthesis. 3. Bilateral lumbar radiculopathy.  PROCEDURE: 1. L4-5 laminectomy with bilateral partial facetectomy and bilateral     foraminotomy, requiring substantially more bone removal than that     of which is required for the fusion portion of the procedure.   2. Right-sided L4-5 transforaminal lumbar interbody fusion. 3. Left-sided L4-5 posterolateral fusion. 4. Placement of posterior segmental instrumentation (5 x 40 mm pedicle     screws x4). 5. Insertion of interbody device x1 (11 x 27 mm CONCORDE Bullet cage). 6. Use of local autograft. 7. Use of morselized allograft. 8. Intraoperative use of fluoroscopy.  SURGEON:  Phylliss Bob, MD  ASSISTANT:  Pricilla Holm, PA-C.  ANESTHESIA:  General endotracheal anesthesia.  COMPLICATIONS:  None.  DISPOSITION:  Stable.  ESTIMATED BLOOD LOSS:  100 mL.  INDICATIONS FOR SURGERY:  Briefly, [name] is a very pleasant 64 year old female, who did present to me with severe and debilitating pain in her right greater than left leg.  An MRI was clearly diagnostic for the pathology causing her pain.  She did fail conservative treatment measures, and we therefore discussed proceeding with the procedure reflected above.  The patient did fully understand the risks and limitations of the procedure and did elect to proceed.  OPERATIVE DETAILS:  On December 11, 2014, the patient was brought to  surgery and general endotracheal anesthesia was administered.  The patient was placed prone on a well-padded flat Jackson bed with a spinal frame. Antibiotics were given and a time-out procedure was performed.  The back was prepped and draped and a midline incision was made.  The fascia was incised in the midline and a self-retaining retractor was placed.  Using anatomic landmarks in addition to AP and lateral fluoroscopy, I did cannulate the L4 and L5 pedicles bilaterally using a medial to lateral cortical trajectory technique.  I did tap up to 5 mm.  On the left side, I did decorticate the posterior elements and the posterolateral gutter to help aid in the fusion.  On the left, I then placed 5 x 40 mm screws at L4 and at L5.  A 35-mm rod was placed and distraction was applied across the rod.  Caps were placed and provisionally tightened.  On the right side, I did place bone wax in the cannulated pedicle holes.  I then performed a central and thorough bilateral decompression, removing substantial overgrowth of the significantly hypertrophied L4-5 facet joints.  I was very pleased with the decompression that I was able to accomplish.  Of note, this was the most time consuming portion of the procedure, as the stenosis was noted to be rather severe, and causing  substantial compression of the traversing and exiting nerves.  Once the decompression was accomplished, I did perform a full facetectomy on the right.  With the assistant holding medial retraction of the traversing L5 nerve, I did use a 15-blade knife to perform an annulotomy at the posterolateral aspect of the L4-5 intervertebral disc.  I then proceeded with a thorough and complete intervertebral diskectomy.  The endplates were then prepared, and the interbody space was then packed with the DBX mix in addition to autograft from the decompression.  I then selected an 11 x 27 mm CONCORDE Bullet cage, which was then inserted into  the intervertebral space.  I was very pleased with the press-fit of the implant, and I was very pleased with the final resting position on AP and lateral fluoroscopy.  Of note, prior to placing the bone graft, I did copiously irrigate the wound with approximately 2 L of normal saline.  On the right, I then placed 5 x 40 mm screws into the L4 and L5 pedicles.  Distraction was discontinued on the left side at this point. A 35-mm rod was placed.  Caps were then placed and a final locking procedure was then performed on both the right and on the left sides.  I was very pleased with the final AP and lateral fluoroscopic images.  All bleeding was thoroughly controlled at the termination of the procedure. There was no extravasation of cerebrospinal fluid noted.  I then closed the wound in layers using #1, Vicryl followed by 0 Vicryl, followed by 2- 0 Vicryl, and followed by 3-0 Monocryl.  Of note, I did use neurologic monitoring throughout the surgery, and there was no sustained abnormal EMG activity noted throughout the surgery.  Of note, Pricilla Holm was my assistant throughout surgery, and did aid in retraction, suctioning, and closure.     Phylliss Bob, MD     MD/MEDQ  D:  12/11/2014  T:  12/11/2014  Job:  [id number]  cc:   Ashby Dawes, Dr. Volanda Napoleon, M.D.

## 2014-12-11 NOTE — Care Management Utilization Note (Signed)
Utilization review completed by Calden Dorsey N. Daira Hine, RN BSN 

## 2014-12-11 NOTE — Anesthesia Procedure Notes (Addendum)
Procedure Name: Intubation Date/Time: 12/11/2014 7:38 AM Performed by: Jacquiline Doe A Pre-anesthesia Checklist: Patient identified, Timeout performed, Emergency Drugs available, Suction available and Patient being monitored Patient Re-evaluated:Patient Re-evaluated prior to inductionOxygen Delivery Method: Circle system utilized Preoxygenation: Pre-oxygenation with 100% oxygen Intubation Type: IV induction and Cricoid Pressure applied Ventilation: Mask ventilation without difficulty Laryngoscope Size: Mac and 4 Grade View: Grade I Tube type: Oral Tube size: 7.5 mm Number of attempts: 1 Airway Equipment and Method: Stylet Placement Confirmation: ETT inserted through vocal cords under direct vision,  breath sounds checked- equal and bilateral and positive ETCO2 Secured at: 22 cm Dental Injury: Teeth and Oropharynx as per pre-operative assessment

## 2014-12-11 NOTE — Anesthesia Preprocedure Evaluation (Addendum)
Anesthesia Evaluation  Patient identified by MRN, date of birth, ID band Patient awake    Reviewed: Allergy & Precautions, NPO status , Patient's Chart, lab work & pertinent test results  Airway Mallampati: II  TM Distance: >3 FB Neck ROM: Full    Dental   Pulmonary asthma , former smoker,  breath sounds clear to auscultation        Cardiovascular hypertension, Pt. on medications Rhythm:Regular Rate:Normal     Neuro/Psych CVA    GI/Hepatic Neg liver ROS, GERD-  ,  Endo/Other  Morbid obesity  Renal/GU negative Renal ROS     Musculoskeletal  (+) Arthritis -,   Abdominal   Peds  Hematology Plts 140   Anesthesia Other Findings   Reproductive/Obstetrics                            Anesthesia Physical Anesthesia Plan  ASA: III  Anesthesia Plan: General   Post-op Pain Management:    Induction: Intravenous  Airway Management Planned: Oral ETT  Additional Equipment:   Intra-op Plan:   Post-operative Plan: Extubation in OR  Informed Consent: I have reviewed the patients History and Physical, chart, labs and discussed the procedure including the risks, benefits and alternatives for the proposed anesthesia with the patient or authorized representative who has indicated his/her understanding and acceptance.   Dental advisory given  Plan Discussed with: CRNA  Anesthesia Plan Comments:         Anesthesia Quick Evaluation

## 2014-12-11 NOTE — Anesthesia Postprocedure Evaluation (Signed)
  Anesthesia Post-op Note  Patient: [name]  Procedure(s) Performed: Procedure(s) with comments: POSTERIOR LUMBAR FUSION 1 LEVEL (Right) - Right sided lumbar 4-5 Transforaminal lumbar interbody fusion with instrumentation and allograft  Patient Location: PACU  Anesthesia Type:General  Level of Consciousness: awake and alert   Airway and Oxygen Therapy: Patient Spontanous Breathing  Post-op Pain: mild  Post-op Assessment: Post-op Vital signs reviewed              Post-op Vital Signs: Reviewed  Last Vitals:  Filed Vitals:   12/11/14 1413  BP: 150/80  Pulse: 71  Temp:   Resp: 16    Complications: No apparent anesthesia complications

## 2014-12-11 NOTE — Transfer of Care (Signed)
Immediate Anesthesia Transfer of Care Note  Patient: [name]  Procedure(s) Performed: Procedure(s) with comments: POSTERIOR LUMBAR FUSION 1 LEVEL (Right) - Right sided lumbar 4-5 Transforaminal lumbar interbody fusion with instrumentation and allograft  Patient Location: PACU  Anesthesia Type:General  Level of Consciousness: awake, alert , oriented, sedated, patient cooperative and responds to stimulation  Airway & Oxygen Therapy: Patient Spontanous Breathing and Patient connected to nasal cannula oxygen  Post-op Assessment: Report given to RN, Post -op Vital signs reviewed and stable, Patient moving all extremities and Patient moving all extremities X 4  Post vital signs: Reviewed and stable  Last Vitals:  Filed Vitals:   12/11/14 0618  BP: 165/73  Pulse: 65  Temp: 36.7 C  Resp: 20    Complications: No apparent anesthesia complications

## 2014-12-12 MED ORDER — ONDANSETRON HCL 4 MG PO TABS
4.0000 mg | ORAL_TABLET | Freq: Three times a day (TID) | ORAL | Status: DC | PRN
  Administered 2014-12-12: 4 mg via ORAL
  Filled 2014-12-12: qty 1

## 2014-12-12 MED ORDER — HYDROMORPHONE HCL 2 MG PO TABS: 1.0000 mg | ORAL_TABLET | ORAL | Status: DC | PRN

## 2014-12-12 MED FILL — Heparin Sodium (Porcine) Inj 1000 Unit/ML: INTRAMUSCULAR | Qty: 30 | Status: AC

## 2014-12-12 MED FILL — Sodium Chloride IV Soln 0.9%: INTRAVENOUS | Qty: 1000 | Status: AC

## 2014-12-12 MED FILL — Thrombin For Soln 20000 Unit: CUTANEOUS | Qty: 1 | Status: AC

## 2014-12-12 NOTE — Evaluation (Signed)
Occupational Therapy Evaluation Patient Details Name: [name] MRN: [id number] DOB: [birth year] Today's Date: 12/12/2014    History of Present Illness Pt is a 64 y/o female admitted s/p elective L4-5 decompression and fusion.    Clinical Impression   PTA pt lived at at home and was independent with ADLs.. Pt is moving slowly and limited by pain and nausea and requires mod/Max  A for ADLs. Pt will benefit from acute OT to progress to Supervision level to return home with family assist.     Follow Up Recommendations  Home health OT    Equipment Recommendations  3 in 1 bedside comode    Recommendations for Other Services       Precautions / Restrictions Precautions Precautions: Fall;Back Precaution Comments: Pt was educated on back precautions. Was able to recall 3/3 at end of session.  Restrictions Weight Bearing Restrictions: No      Mobility Bed Mobility Overal bed mobility: Needs Assistance Bed Mobility: Rolling;Sidelying to Sit;Sit to Sidelying Rolling: Min guard Sidelying to sit: Min assist     Sit to sidelying: Min assist General bed mobility comments: Assist for trunk control as pt elevated to full sitting position. VC's for sequencing and technique to maintain back precautions.   Transfers Overall transfer level: Needs assistance Equipment used: Rolling walker (2 wheeled) Transfers: Sit to/from Stand Sit to Stand: Min assist         General transfer comment: Assist to power-up to full standing position. Increased time to gain/maintain balance.          ADL Overall ADL's : Needs assistance/impaired Eating/Feeding: Independent;Sitting   Grooming: Min guard;Standing   Upper Body Bathing: Minimal assitance;Sitting   Lower Body Bathing: Moderate assistance;Sit to/from stand   Upper Body Dressing : Maximal assistance;Sitting   Lower Body Dressing: Maximal assistance;Sit to/from stand   Toilet Transfer: Minimal  assistance;Ambulation;RW;Comfort height toilet;Grab bars   Toileting- Clothing Manipulation and Hygiene: Minimal assistance;Sit to/from stand       Functional mobility during ADLs: Minimal assistance;Rolling walker General ADL Comments: Pt limited by nausea and reports dizziness when standing at sink. Pt moving slowly but is motivated. She reports she will be by herself in the mornings, but will have assist to get OOB and don brace before she is alone.      Vision Additional Comments: No change from baseline.          Pertinent Vitals/Pain Pain Assessment: 0-10 Pain Score: 5  Faces Pain Scale: Hurts little more Pain Location: back; surgical Pain Descriptors / Indicators: Aching;Sore Pain Intervention(s): Limited activity within patient's tolerance;Monitored during session;Repositioned     Hand Dominance     Extremity/Trunk Assessment Upper Extremity Assessment Upper Extremity Assessment: Generalized weakness   Lower Extremity Assessment Lower Extremity Assessment: Generalized weakness   Cervical / Trunk Assessment Cervical / Trunk Assessment: Normal   Communication Communication Communication: No difficulties   Cognition Arousal/Alertness: Awake/alert Behavior During Therapy: WFL for tasks assessed/performed Overall Cognitive Status: Within Functional Limits for tasks assessed                                Home Living Family/patient expects to be discharged to:: Private residence Living Arrangements: Spouse/significant other Available Help at Discharge: Family;Available PRN/intermittently Type of Home: House Home Access: Stairs to enter Entrance Stairs-Number of Steps: 2   Home Layout: One level  Home Equipment: None          Prior Functioning/Environment Level of Independence: Independent             OT Diagnosis: Generalized weakness;Acute pain   OT Problem List: Decreased strength;Decreased range of  motion;Decreased activity tolerance;Impaired balance (sitting and/or standing);Decreased knowledge of use of DME or AE;Decreased knowledge of precautions;Pain   OT Treatment/Interventions: Self-care/ADL training;Therapeutic exercise;Energy conservation;DME and/or AE instruction;Therapeutic activities;Patient/family education;Balance training    OT Goals(Current goals can be found in the care plan section) Acute Rehab OT Goals Patient Stated Goal: to stop vomiting OT Goal Formulation: With patient Time For Goal Achievement: 12/26/14 Potential to Achieve Goals: Good ADL Goals Pt Will Perform Grooming: with supervision;standing Pt Will Perform Upper Body Bathing: with supervision;with set-up;sitting Pt Will Perform Upper Body Dressing: with min assist;sitting Pt Will Transfer to Toilet: with supervision;ambulating Pt Will Perform Toileting - Clothing Manipulation and hygiene: with supervision;sit to/from stand Pt Will Perform Tub/Shower Transfer: with supervision;ambulating;rolling walker  OT Frequency: Min 2X/week    End of Session Equipment Utilized During Treatment: Gait belt;Rolling walker;Back brace Nurse Communication: Other (comment) (nausea, dizziness)  Activity Tolerance: Other (comment) (pt limited by nausea and dizziness) Patient left: in bed;with call bell/phone within reach;with family/visitor present   Time: 0017-4944 OT Time Calculation (min): 46 min Charges:  OT General Charges $OT Visit: 1 Procedure OT Evaluation $Initial OT Evaluation Tier I: 1 Procedure OT Treatments $Self Care/Home Management : 23-37 mins G-Codes:    Villa Herb M 12-28-14, 1:51 PM  Cyndie Chime, OTR/L Occupational Therapist [phone] (pager)

## 2014-12-12 NOTE — Progress Notes (Signed)
    Patient doing well Patient denies leg pain    Physical Exam: Filed Vitals:   12/12/14 0423  BP: 122/61  Pulse: 69  Temp: 99.2 F (37.3 C)  Resp: 20    Dressing in place NVI  POD #1 s/p L4/5 decompression and fusion  - up with PT/OT, encourage ambulation - Dialudid for pain, Valium for muscle spasms - likely d/c home today vs. tomorrow

## 2014-12-12 NOTE — Evaluation (Signed)
Physical Therapy Evaluation Patient Details Name: [name] MRN: [id number] DOB: 27-Jan-1951 Today's Date: 12/12/2014   History of Present Illness  Pt is a 64 y/o female admitted s/p elective L4-5 decompression and fusion.   Clinical Impression  Pt admitted with above diagnosis. Pt currently with functional limitations due to the deficits listed below (see PT Problem List). At the time of PT eval pt was able to perform transfers and ambulation with min guard to min assist for safety and balance. Pt was limited throughout session by nausea/vomiting. Will need to practice brace application and steps prior to d/c. Pt will benefit from skilled PT to increase their independence and safety with mobility to allow discharge to the venue listed below.       Follow Up Recommendations Home health PT;Supervision/Assistance - 24 hour    Equipment Recommendations  Rolling walker with 5" wheels;3in1 (PT)    Recommendations for Other Services       Precautions / Restrictions Precautions Precautions: Fall;Back Precaution Comments: Pt was educated on back precautions. Was able to recall 3/3 at end of session.  Restrictions Weight Bearing Restrictions: No      Mobility  Bed Mobility Overal bed mobility: Needs Assistance Bed Mobility: Rolling;Sidelying to Sit Rolling: Min guard Sidelying to sit: Min assist       General bed mobility comments: Assist for trunk control as pt elevated to full sitting position. VC's for sequencing and technique to maintain back precautions.   Transfers Overall transfer level: Needs assistance Equipment used: Rolling walker (2 wheeled) Transfers: Sit to/from Stand Sit to Stand: Min assist         General transfer comment: Assist to power-up to full standing position. Increased time to gain/maintain balance.   Ambulation/Gait Ambulation/Gait assistance: Min guard Ambulation Distance (Feet): 100 Feet Assistive device: Rolling walker (2 wheeled) Gait  Pattern/deviations: Step-through pattern;Decreased stride length;Trunk flexed Gait velocity: Decreased Gait velocity interpretation: Below normal speed for age/gender General Gait Details: Pt required 3 standing rest breaks throughout gait training due to fatigue and nausea. Min guard assist for safety was provided.   Stairs            Wheelchair Mobility    Modified Rankin (Stroke Patients Only)       Balance Overall balance assessment: Needs assistance Sitting-balance support: Feet supported;No upper extremity supported Sitting balance-Leahy Scale: Fair     Standing balance support: Bilateral upper extremity supported;During functional activity Standing balance-Leahy Scale: Poor                               Pertinent Vitals/Pain Pain Assessment: Faces Faces Pain Scale: Hurts little more Pain Location: Incisional pain Pain Descriptors / Indicators: Operative site guarding Pain Intervention(s): Limited activity within patient's tolerance;Monitored during session;Repositioned    Home Living Family/patient expects to be discharged to:: Private residence Living Arrangements: Spouse/significant other Available Help at Discharge: Family;Available PRN/intermittently Type of Home: House Home Access: Stairs to enter   [employer] of Steps: 2 Home Layout: One level Home Equipment: None      Prior Function Level of Independence: Independent               Hand Dominance        Extremity/Trunk Assessment   Upper Extremity Assessment: Defer to OT evaluation           Lower Extremity Assessment: Generalized weakness      Cervical / Trunk Assessment: Normal  Communication   Communication: No difficulties  Cognition Arousal/Alertness: Awake/alert Behavior During Therapy: WFL for tasks assessed/performed Overall Cognitive Status: Within Functional Limits for tasks assessed                      General Comments       Exercises        Assessment/Plan    PT Assessment Patient needs continued PT services  PT Diagnosis Difficulty walking;Generalized weakness   PT Problem List Decreased strength;Decreased range of motion;Decreased activity tolerance;Decreased balance;Decreased mobility;Decreased knowledge of use of DME;Decreased safety awareness;Decreased knowledge of precautions;Pain  PT Treatment Interventions DME instruction;Gait training;Stair training;Functional mobility training;Therapeutic activities;Therapeutic exercise;Neuromuscular re-education;Patient/family education   PT Goals (Current goals can be found in the Care Plan section) Acute Rehab PT Goals Patient Stated Goal: Feel better PT Goal Formulation: With patient Time For Goal Achievement: 12/19/14 Potential to Achieve Goals: Good    Frequency Min 5X/week   Barriers to discharge        Co-evaluation               End of Session Equipment Utilized During Treatment: Back brace Activity Tolerance: Patient limited by fatigue;Treatment limited secondary to medical complications (Comment) (vomiting x2) Patient left: in chair;with call bell/phone within reach Nurse Communication: Mobility status         Time: 0916-0952 PT Time Calculation (min) (ACUTE ONLY): 36 min   Charges:   PT Evaluation $Initial PT Evaluation Tier I: 1 Procedure PT Treatments $Gait Training: 8-22 mins   PT G Codes:        Rolinda Roan 01/09/2015, 12:19 PM  Rolinda Roan, PT, DPT Acute Rehabilitation Services Pager: [phone]

## 2014-12-13 NOTE — Progress Notes (Signed)
Physical Therapy Treatment Patient Details Name: [name] MRN: [id number] DOB: 03-Dec-1950 Today's Date: 12/13/2014    History of Present Illness Pt is a 64 y/o female admitted s/p elective L4-5 decompression and fusion.     PT Comments    Patient progressing well today.  Still needs some assistance sit to stand, but ambulating without assistance.  Practiced stairs today and able to negotiate with min assistance.  Feel patient is safe to d/c home with husband from PT standpoint.  I do recommend HHPT to further progress strengthening and ambulation/transfers.    Follow Up Recommendations  Home health PT;Supervision/Assistance - 24 hour     Equipment Recommendations  Rolling walker with 5" wheels;3in1 (PT)    Recommendations for Other Services       Precautions / Restrictions Precautions Precautions: Fall;Back Precaution Comments: able to recall 3/3 of back precautions Required Braces or Orthoses: Spinal Brace Spinal Brace: Applied in sitting position;Thoracolumbosacral orthotic Restrictions Weight Bearing Restrictions: No    Mobility  Bed Mobility Overal bed mobility: Needs Assistance Bed Mobility: Sit to Sidelying;Rolling Rolling: Modified independent (Device/Increase time) Sidelying to sit: Min assist     Sit to sidelying: Min assist General bed mobility comments: used railing for rolling; required assistance with LE's for sit to sidelying  Transfers Overall transfer level: Needs assistance Equipment used: Rolling walker (2 wheeled) Transfers: Sit to/from Stand Sit to Stand: Min assist         General transfer comment: Assist to power-up to full standing position. Increased time to gain/maintain balance.   Ambulation/Gait Ambulation/Gait assistance: Modified independent (Device/Increase time) Ambulation Distance (Feet): 150 Feet Assistive device: Rolling walker (2 wheeled) Gait Pattern/deviations: Step-through pattern Gait velocity: Decreased        Stairs Stairs: Yes Stairs assistance: Min assist Stair Management: No rails;Forwards;Step to pattern Number of Stairs: 2 General stair comments: will use door frame and 1 person hand-held assistance to negotiate up/down steps.  Practiced using rail as "door frame" and 1 person hand-held assist  Wheelchair Mobility    Modified Rankin (Stroke Patients Only)       Balance Overall balance assessment: No apparent balance deficits (not formally assessed) (with RW)                                  Cognition Arousal/Alertness: Awake/alert Behavior During Therapy: WFL for tasks assessed/performed Overall Cognitive Status: Within Functional Limits for tasks assessed                      Exercises      General Comments        Pertinent Vitals/Pain Pain Assessment: No/denies pain Pain Score: 3  Pain Location: back; surgical Pain Descriptors / Indicators: Aching;Sore Pain Intervention(s): Monitored during session    Home Living                      Prior Function            PT Goals (current goals can now be found in the care plan section) Progress towards PT goals: Progressing toward goals    Frequency  Min 5X/week    PT Plan Current plan remains appropriate    Co-evaluation             End of Session Equipment Utilized During Treatment: Back brace;Gait belt Activity Tolerance: Patient tolerated treatment well Patient left: in bed;with call bell/phone within  reach     Time: 0900-0920 PT Time Calculation (min) (ACUTE ONLY): 20 min  Charges:  $Gait Training: 8-22 mins                    G Codes:      Shanna Cisco 2014/12/31, 9:36 AM  Dec 31, 2014 [place], [place]

## 2014-12-13 NOTE — Discharge Instructions (Signed)
See pre-printed information given to you separately

## 2014-12-13 NOTE — Progress Notes (Signed)
Pt and husband given D/C instructions with Rx's, verbal understanding was provided. Pt's incision is clean and dry with no sign of infection. Pt's IV was removed prior to D/C. Pt D/C'd home via wheelchair with husband @ 1000 per MD order. Pt is stable @ D/C and has no other needs at this time. Holli Humbles, RN

## 2014-12-13 NOTE — Progress Notes (Signed)
Occupational Therapy Treatment Patient Details Name: [name] MRN: [id number] DOB: 1950-07-31 Today's Date: 12/13/2014    History of present illness Pt is a 64 y/o female admitted s/p elective L4-5 decompression and fusion.    OT comments  Pt. Progressing with goals.  Interested in A/E for ADL completion at home.  Provided with A/E for practice and was able to return demo of use.  Eager for d/c home.  Will have intermittent A from husband and other family members  Follow Up Recommendations  Home health OT    Equipment Recommendations  3 in 1 bedside comode    Recommendations for Other Services      Precautions / Restrictions Precautions Precautions: Fall;Back Precaution Comments: Pt was educated on back precautions. Was able to recall 3/3 at end of session.        Mobility Bed Mobility Overal bed mobility: Needs Assistance Bed Mobility: Rolling;Sidelying to Sit Rolling: Supervision Sidelying to sit: Min assist       General bed mobility comments: Assist for trunk control as pt elevated to full sitting position. VC's for sequencing and technique to maintain back precautions.   Transfers Overall transfer level: Needs assistance Equipment used: Rolling walker (2 wheeled) Transfers: Sit to/from Stand Sit to Stand: Min assist         General transfer comment: Assist to power-up to full standing position. Increased time to gain/maintain balance.     Balance                                   ADL Overall ADL's : Needs assistance/impaired             Lower Body Bathing: Minimal assistance;Sit to/from stand;With adaptive equipment;Adhering to back precautions Lower Body Bathing Details (indicate cue type and reason): simulated with use of LH sponge     Lower Body Dressing: Minimal assistance;With adaptive equipment;Sit to/from stand;Adhering to back precautions   Toilet Transfer: Min guard;Ambulation;RW Toilet Transfer Details (indicate cue  type and reason): simulated during amb./transfer in room Toileting- Clothing Manipulation and Hygiene: Minimal assistance;Sit to/from stand Toileting - Clothing Manipulation Details (indicate cue type and reason): sim. during transfers in room     Functional mobility during ADLs: Min guard;Rolling walker General ADL Comments: pt. interested in A/E provided information for purchase in gift shop      Vision                     Perception     Praxis      Cognition   Behavior During Therapy: [facility] for tasks assessed/performed Overall Cognitive Status: Within Functional Limits for tasks assessed                       Extremity/Trunk Assessment               Exercises     Shoulder Instructions       General Comments      Pertinent Vitals/ Pain       Pain Assessment: No/denies pain  Home Living                                          Prior Functioning/Environment              Frequency Min 2X/week  Progress Toward Goals  OT Goals(current goals can now be found in the care plan section)  Progress towards OT goals: Progressing toward goals     Plan Discharge plan remains appropriate    Co-evaluation                 End of Session Equipment Utilized During Treatment: Rolling walker;Back brace   Activity Tolerance Patient tolerated treatment well   Patient Left in chair;with call bell/phone within reach   Nurse Communication          Time: 7169-6789 OT Time Calculation (min): 54 min  Charges: OT Treatments $Self Care/Home Management : 53-67 mins  Janice Coffin, COTA/L 12/13/2014, 8:43 AM

## 2014-12-13 NOTE — Discharge Summary (Signed)
Physician Discharge Summary  Patient ID: [name] MRN: [id number] DOB/AGE: 1951/01/06 64 y.o.  Admit date: 12/11/2014 Discharge date: 12/13/2014  Admission Diagnoses:  Spinal stenosis with radiculopathy  Discharge Diagnoses:  Active Problems:   Radiculopathy   Past Medical History  Diagnosis Date  . 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 Lupus 07/13/2011  . Asthma   . Acid reflux   . Hypertension   . Stroke     lt side weakness  . Arthritis     Surgeries: Procedure(s): POSTERIOR LUMBAR FUSION 1 LEVEL on 12/11/2014   Consultants (if any): Treatment Team:  Volanda Napoleon, MD  Discharged Condition: Improved  Hospital Course: [name] is an 64 y.o. female who was admitted 12/11/2014 with a diagnosis of spinal stenosis with radiculopathy and went to the operating room on 12/11/2014 and underwent the above named procedures.    She was given perioperative antibiotics:  Anti-infectives    Start     Dose/Rate Route Frequency Ordered Stop   12/11/14 1430  hydroxychloroquine (PLAQUENIL) tablet 400 mg     400 mg Oral Daily 12/11/14 1420     12/11/14 1430  ceFAZolin (ANCEF) IVPB 1 g/50 mL premix     1 g 100 mL/hr over 30 Minutes Intravenous Every 8 hours 12/11/14 1420 12/11/14 2219   12/11/14 0645  ceFAZolin (ANCEF) IVPB 2 g/50 mL premix     2 g 100 mL/hr over 30 Minutes Intravenous To Surgery 12/10/14 1434 12/11/14 1140    .  She was given sequential compression devices & early ambulation  for DVT prophylaxis.  She benefited maximally from the hospital stay and there were no complications.  She was stable for discharge, having cleared PT prior to d/c  Recent vital signs:  Filed Vitals:   12/13/14 0828  BP: 134/64  Pulse: 76  Temp: 98.3 F (36.8 C)  Resp: 18    Recent laboratory studies:  Lab Results  Component Value Date   HGB 13.0 12/04/2014   HGB 12.8 07/18/2013   HGB 13.4 01/15/2013   Lab Results  Component Value Date   WBC 7.7  12/04/2014   PLT 140* 12/04/2014   Lab Results  Component Value Date   INR 1.06 12/04/2014   Lab Results  Component Value Date   NA 141 12/04/2014   K 3.2* 12/04/2014   CL 106 12/04/2014   CO2 26 12/04/2014   BUN 16 12/04/2014   CREATININE 1.04* 12/04/2014   GLUCOSE 95 12/04/2014    Discharge Medications:     Medication List    STOP taking these medications        cyclobenzaprine 10 MG tablet  Commonly known as:  FLEXERIL      TAKE these medications        ARTIFICIAL TEARS OP  Apply 1 drop to eye 4 (four) times daily as needed (for dry eyes).     aspirin 81 MG tablet  Take 81 mg by mouth daily.     Biotin 1000 MCG tablet  Take 1,000 mcg by mouth daily.     CALCIUM 600 + D PO  Take 1 tablet by mouth daily.     calcium carbonate 600 MG Tabs tablet  Commonly known as:  OS-CAL  Take 600 mg by mouth daily.     cholecalciferol 1000 UNITS tablet  Commonly known as:  VITAMIN D  Take 1,000 Units by mouth daily.     FIBER PO  Take 1 tablet by mouth daily.  furosemide 80 MG tablet  Commonly known as:  LASIX  Take 80 mg by mouth daily.     furosemide 40 MG tablet  Commonly known as:  LASIX  Take 40 mg by mouth daily.     HYDROmorphone 2 MG tablet  Commonly known as:  DILAUDID  Take 0.5-1 tablets (1-2 mg total) by mouth every 4 (four) hours as needed for severe pain.     hydroxychloroquine 200 MG tablet  Commonly known as:  PLAQUENIL  Take 400 mg by mouth daily.     losartan 100 MG tablet  Commonly known as:  COZAAR  Take 100 mg by mouth daily.     losartan 25 MG tablet  Commonly known as:  COZAAR  Take 25 mg by mouth daily.     METAMUCIL Wafr  Take 1 Wafer by mouth daily as needed (for constipation).     multivitamin tablet  Take 1 tablet by mouth daily.     pantoprazole 40 MG tablet  Commonly known as:  PROTONIX  Take 40 mg by mouth daily.     potassium chloride SA 20 MEQ tablet  Commonly known as:  K-DUR,KLOR-CON  Take 40 mEq by mouth  daily.     predniSONE 5 MG tablet  Commonly known as:  DELTASONE  Take 5 mg by mouth daily.     PREMARIN vaginal cream  Generic drug:  conjugated estrogens  Place 0.5 g vaginally 4 (four) times a week.     PROBIOTIC PO  Take 1 capsule by mouth daily.     ranitidine 300 MG tablet  Commonly known as:  ZANTAC  Take 300 mg by mouth daily as needed for heartburn.        Diagnostic Studies: Dg Chest 2 View  12/04/2014   CLINICAL DATA:  64 year old female preoperative study. Initial encounter. Current history of hypertension and stroke. Personal history of lymphoma.  EXAM: CHEST  2 VIEW  COMPARISON:  CT Abdomen and Pelvis 01/27/2014 and earlier.  FINDINGS: Right chest porta cath has been removed since 2009. Stable cholecystectomy clips. Cervical ACDF hardware Re identified. Lung volumes remain normal. Normal cardiac size and mediastinal contours. Visualized tracheal air column is within normal limits. No pneumothorax, pulmonary edema, pleural effusion or confluent pulmonary opacity. Mild thoracolumbar scoliosis. Postoperative changes also to the right shoulder. No acute osseous abnormality identified.  IMPRESSION: No acute cardiopulmonary abnormality.   Electronically Signed   By: Genevie Ann M.D.   On: 12/04/2014 11:14   Dg Lumbar Spine 2-3 Views  12/11/2014   CLINICAL DATA:  Elective back surgery.  Fusion at L4-5.  EXAM: LUMBAR SPINE - 2-3 VIEW; DG C-ARM 61-120 MIN  COMPARISON:  12/11/2014 intraoperative images; lumbar MRI from 11/11/2014  FINDINGS: Posterolateral rod and pedicle screw fixation observed at L4-5, with interbody spacer and posterior decompression. No complicating feature observed on the 2 fluoroscopic spot images provided.  IMPRESSION: 1. PLIF at U5-4 without complicating feature identified.   Electronically Signed   By: Van Clines M.D.   On: 12/11/2014 11:46   Dg Lumbar Spine 1 View  12/11/2014   CLINICAL DATA:  Single lateral localization film for right-sided L4-5 trans  foraminal lumbar interbody fusion and allograft placement  EXAM: LUMBAR SPINE - 1 VIEW  COMPARISON:  MRI of the lumbar spine of Nov 11, 2014  FINDINGS: The lumbar vertebral bodies are preserved in height. The disc space heights are reasonably well-maintained. The metallic localization needles lie within the soft tissues posteriorly. The uppermost  needle lies approximately 7 cm posterior to the body of L3. The lower most needle lies 6 cm posterior to the body of L5.  IMPRESSION: Lateral lumbar localization images with the upper and lower metallic instruments positioned as described.   Electronically Signed   By: Hence Derrick  [place] M.D.   On: 12/11/2014 09:52   Dg C-arm 1-60 Min  12/11/2014   CLINICAL DATA:  Elective back surgery.  Fusion at L4-5.  EXAM: LUMBAR SPINE - 2-3 VIEW; DG C-ARM 61-120 MIN  COMPARISON:  12/11/2014 intraoperative images; lumbar MRI from 11/11/2014  FINDINGS: Posterolateral rod and pedicle screw fixation observed at L4-5, with interbody spacer and posterior decompression. No complicating feature observed on the 2 fluoroscopic spot images provided.  IMPRESSION: 1. PLIF at H2-9 without complicating feature identified.   Electronically Signed   By: Van Clines M.D.   On: 12/11/2014 11:46    Disposition: 01-Home or Self Care        Follow-up Information    Follow up with Sinclair Ship, MD.   Specialty:  Orthopedic Surgery   Why:  12-24-14 at 8:15 am   Contact information:   [address] [place] [id number] [phone]        Signed: Grandville Silos, Avneet Ashmore A. 12/13/2014, 9:29 AM

## 2014-12-17 ENCOUNTER — Encounter (HOSPITAL_COMMUNITY): Payer: Self-pay | Admitting: Orthopedic Surgery

## 2014-12-26 DIAGNOSIS — M4316 Spondylolisthesis, lumbar region: Secondary | ICD-10-CM | POA: Diagnosis not present

## 2015-01-23 DIAGNOSIS — Z9889 Other specified postprocedural states: Secondary | ICD-10-CM | POA: Diagnosis not present

## 2015-02-09 ENCOUNTER — Other Ambulatory Visit: Payer: Self-pay | Admitting: Internal Medicine

## 2015-02-09 DIAGNOSIS — M79605 Pain in left leg: Secondary | ICD-10-CM

## 2015-02-09 DIAGNOSIS — R600 Localized edema: Secondary | ICD-10-CM | POA: Diagnosis not present

## 2015-02-09 DIAGNOSIS — M79604 Pain in right leg: Secondary | ICD-10-CM

## 2015-02-09 DIAGNOSIS — T148 Other injury of unspecified body region: Secondary | ICD-10-CM | POA: Diagnosis not present

## 2015-02-09 DIAGNOSIS — D179 Benign lipomatous neoplasm, unspecified: Secondary | ICD-10-CM | POA: Diagnosis not present

## 2015-02-10 ENCOUNTER — Ambulatory Visit
Admission: RE | Admit: 2015-02-10 | Discharge: 2015-02-10 | Disposition: A | Discharge status: Home / Self Care | Payer: Medicare Other | Source: Ambulatory Visit | Attending: Internal Medicine | Admitting: Internal Medicine

## 2015-02-10 DIAGNOSIS — R600 Localized edema: Secondary | ICD-10-CM | POA: Diagnosis not present

## 2015-02-10 DIAGNOSIS — M79604 Pain in right leg: Secondary | ICD-10-CM

## 2015-02-10 DIAGNOSIS — M79605 Pain in left leg: Secondary | ICD-10-CM

## 2015-02-10 DIAGNOSIS — Z8572 Personal history of non-Hodgkin lymphomas: Secondary | ICD-10-CM | POA: Diagnosis not present

## 2015-02-16 DIAGNOSIS — R600 Localized edema: Secondary | ICD-10-CM | POA: Diagnosis not present

## 2015-03-03 DIAGNOSIS — Z9889 Other specified postprocedural states: Secondary | ICD-10-CM | POA: Diagnosis not present

## 2015-03-04 DIAGNOSIS — M4326 Fusion of spine, lumbar region: Secondary | ICD-10-CM | POA: Diagnosis not present

## 2015-03-04 DIAGNOSIS — M545 Low back pain: Secondary | ICD-10-CM | POA: Diagnosis not present

## 2015-03-04 DIAGNOSIS — R262 Difficulty in walking, not elsewhere classified: Secondary | ICD-10-CM | POA: Diagnosis not present

## 2015-03-11 DIAGNOSIS — M545 Low back pain: Secondary | ICD-10-CM | POA: Diagnosis not present

## 2015-03-11 DIAGNOSIS — R262 Difficulty in walking, not elsewhere classified: Secondary | ICD-10-CM | POA: Diagnosis not present

## 2015-03-11 DIAGNOSIS — M4326 Fusion of spine, lumbar region: Secondary | ICD-10-CM | POA: Diagnosis not present

## 2015-03-12 DIAGNOSIS — M4326 Fusion of spine, lumbar region: Secondary | ICD-10-CM | POA: Diagnosis not present

## 2015-03-12 DIAGNOSIS — R262 Difficulty in walking, not elsewhere classified: Secondary | ICD-10-CM | POA: Diagnosis not present

## 2015-03-12 DIAGNOSIS — M545 Low back pain: Secondary | ICD-10-CM | POA: Diagnosis not present

## 2015-03-13 ENCOUNTER — Other Ambulatory Visit: Payer: Self-pay

## 2015-03-13 DIAGNOSIS — Z1231 Encounter for screening mammogram for malignant neoplasm of breast: Secondary | ICD-10-CM

## 2015-03-17 DIAGNOSIS — R262 Difficulty in walking, not elsewhere classified: Secondary | ICD-10-CM | POA: Diagnosis not present

## 2015-03-17 DIAGNOSIS — M545 Low back pain: Secondary | ICD-10-CM | POA: Diagnosis not present

## 2015-03-17 DIAGNOSIS — M4326 Fusion of spine, lumbar region: Secondary | ICD-10-CM | POA: Diagnosis not present

## 2015-03-19 DIAGNOSIS — M4326 Fusion of spine, lumbar region: Secondary | ICD-10-CM | POA: Diagnosis not present

## 2015-03-19 DIAGNOSIS — R262 Difficulty in walking, not elsewhere classified: Secondary | ICD-10-CM | POA: Diagnosis not present

## 2015-03-19 DIAGNOSIS — M545 Low back pain: Secondary | ICD-10-CM | POA: Diagnosis not present

## 2015-03-24 DIAGNOSIS — M4326 Fusion of spine, lumbar region: Secondary | ICD-10-CM | POA: Diagnosis not present

## 2015-03-24 DIAGNOSIS — M545 Low back pain: Secondary | ICD-10-CM | POA: Diagnosis not present

## 2015-03-24 DIAGNOSIS — R262 Difficulty in walking, not elsewhere classified: Secondary | ICD-10-CM | POA: Diagnosis not present

## 2015-03-26 DIAGNOSIS — R262 Difficulty in walking, not elsewhere classified: Secondary | ICD-10-CM | POA: Diagnosis not present

## 2015-03-26 DIAGNOSIS — M4326 Fusion of spine, lumbar region: Secondary | ICD-10-CM | POA: Diagnosis not present

## 2015-03-26 DIAGNOSIS — M545 Low back pain: Secondary | ICD-10-CM | POA: Diagnosis not present

## 2015-03-31 DIAGNOSIS — M4326 Fusion of spine, lumbar region: Secondary | ICD-10-CM | POA: Diagnosis not present

## 2015-03-31 DIAGNOSIS — R262 Difficulty in walking, not elsewhere classified: Secondary | ICD-10-CM | POA: Diagnosis not present

## 2015-03-31 DIAGNOSIS — M545 Low back pain: Secondary | ICD-10-CM | POA: Diagnosis not present

## 2015-04-02 DIAGNOSIS — R262 Difficulty in walking, not elsewhere classified: Secondary | ICD-10-CM | POA: Diagnosis not present

## 2015-04-02 DIAGNOSIS — M545 Low back pain: Secondary | ICD-10-CM | POA: Diagnosis not present

## 2015-04-02 DIAGNOSIS — M4326 Fusion of spine, lumbar region: Secondary | ICD-10-CM | POA: Diagnosis not present

## 2015-04-07 DIAGNOSIS — R262 Difficulty in walking, not elsewhere classified: Secondary | ICD-10-CM | POA: Diagnosis not present

## 2015-04-07 DIAGNOSIS — D696 Thrombocytopenia, unspecified: Secondary | ICD-10-CM | POA: Diagnosis not present

## 2015-04-07 DIAGNOSIS — M545 Low back pain: Secondary | ICD-10-CM | POA: Diagnosis not present

## 2015-04-07 DIAGNOSIS — R1084 Generalized abdominal pain: Secondary | ICD-10-CM | POA: Diagnosis not present

## 2015-04-07 DIAGNOSIS — I10 Essential (primary) hypertension: Secondary | ICD-10-CM | POA: Diagnosis not present

## 2015-04-07 DIAGNOSIS — M4326 Fusion of spine, lumbar region: Secondary | ICD-10-CM | POA: Diagnosis not present

## 2015-04-09 DIAGNOSIS — M4326 Fusion of spine, lumbar region: Secondary | ICD-10-CM | POA: Diagnosis not present

## 2015-04-09 DIAGNOSIS — R262 Difficulty in walking, not elsewhere classified: Secondary | ICD-10-CM | POA: Diagnosis not present

## 2015-04-09 DIAGNOSIS — M545 Low back pain: Secondary | ICD-10-CM | POA: Diagnosis not present

## 2015-04-11 DIAGNOSIS — M545 Low back pain: Secondary | ICD-10-CM | POA: Diagnosis not present

## 2015-04-13 ENCOUNTER — Ambulatory Visit: Payer: Medicare Other

## 2015-04-13 DIAGNOSIS — R262 Difficulty in walking, not elsewhere classified: Secondary | ICD-10-CM | POA: Diagnosis not present

## 2015-04-13 DIAGNOSIS — M4326 Fusion of spine, lumbar region: Secondary | ICD-10-CM | POA: Diagnosis not present

## 2015-04-13 DIAGNOSIS — M545 Low back pain: Secondary | ICD-10-CM | POA: Diagnosis not present

## 2015-04-14 DIAGNOSIS — R600 Localized edema: Secondary | ICD-10-CM | POA: Diagnosis not present

## 2015-04-14 DIAGNOSIS — I10 Essential (primary) hypertension: Secondary | ICD-10-CM | POA: Diagnosis not present

## 2015-04-14 DIAGNOSIS — Z23 Encounter for immunization: Secondary | ICD-10-CM | POA: Diagnosis not present

## 2015-04-14 DIAGNOSIS — C8300 Small cell B-cell lymphoma, unspecified site: Secondary | ICD-10-CM | POA: Diagnosis not present

## 2015-04-14 DIAGNOSIS — D696 Thrombocytopenia, unspecified: Secondary | ICD-10-CM | POA: Diagnosis not present

## 2015-04-15 DIAGNOSIS — Z01419 Encounter for gynecological examination (general) (routine) without abnormal findings: Secondary | ICD-10-CM | POA: Diagnosis not present

## 2015-04-15 DIAGNOSIS — Z124 Encounter for screening for malignant neoplasm of cervix: Secondary | ICD-10-CM | POA: Diagnosis not present

## 2015-04-15 DIAGNOSIS — Z6830 Body mass index (BMI) 30.0-30.9, adult: Secondary | ICD-10-CM | POA: Diagnosis not present

## 2015-04-16 DIAGNOSIS — M4326 Fusion of spine, lumbar region: Secondary | ICD-10-CM | POA: Diagnosis not present

## 2015-04-16 DIAGNOSIS — M545 Low back pain: Secondary | ICD-10-CM | POA: Diagnosis not present

## 2015-04-16 DIAGNOSIS — R262 Difficulty in walking, not elsewhere classified: Secondary | ICD-10-CM | POA: Diagnosis not present

## 2015-04-21 DIAGNOSIS — M545 Low back pain: Secondary | ICD-10-CM | POA: Diagnosis not present

## 2015-04-21 DIAGNOSIS — R262 Difficulty in walking, not elsewhere classified: Secondary | ICD-10-CM | POA: Diagnosis not present

## 2015-04-21 DIAGNOSIS — M4326 Fusion of spine, lumbar region: Secondary | ICD-10-CM | POA: Diagnosis not present

## 2015-04-23 DIAGNOSIS — R262 Difficulty in walking, not elsewhere classified: Secondary | ICD-10-CM | POA: Diagnosis not present

## 2015-04-23 DIAGNOSIS — M545 Low back pain: Secondary | ICD-10-CM | POA: Diagnosis not present

## 2015-04-23 DIAGNOSIS — M4326 Fusion of spine, lumbar region: Secondary | ICD-10-CM | POA: Diagnosis not present

## 2015-04-28 DIAGNOSIS — M545 Low back pain: Secondary | ICD-10-CM | POA: Diagnosis not present

## 2015-04-28 DIAGNOSIS — R262 Difficulty in walking, not elsewhere classified: Secondary | ICD-10-CM | POA: Diagnosis not present

## 2015-04-28 DIAGNOSIS — M4326 Fusion of spine, lumbar region: Secondary | ICD-10-CM | POA: Diagnosis not present

## 2015-04-29 ENCOUNTER — Ambulatory Visit
Admission: RE | Admit: 2015-04-29 | Discharge: 2015-04-29 | Disposition: A | Discharge status: Home / Self Care | Payer: Medicare Other | Source: Ambulatory Visit

## 2015-04-29 DIAGNOSIS — Z1231 Encounter for screening mammogram for malignant neoplasm of breast: Secondary | ICD-10-CM | POA: Diagnosis not present

## 2015-05-04 DIAGNOSIS — M150 Primary generalized (osteo)arthritis: Secondary | ICD-10-CM | POA: Diagnosis not present

## 2015-05-04 DIAGNOSIS — R5381 Other malaise: Secondary | ICD-10-CM | POA: Diagnosis not present

## 2015-05-04 DIAGNOSIS — M3501 Sicca syndrome with keratoconjunctivitis: Secondary | ICD-10-CM | POA: Diagnosis not present

## 2015-05-04 DIAGNOSIS — Z79899 Other long term (current) drug therapy: Secondary | ICD-10-CM | POA: Diagnosis not present

## 2015-05-04 DIAGNOSIS — E559 Vitamin D deficiency, unspecified: Secondary | ICD-10-CM | POA: Diagnosis not present

## 2015-05-05 DIAGNOSIS — M4326 Fusion of spine, lumbar region: Secondary | ICD-10-CM | POA: Diagnosis not present

## 2015-05-05 DIAGNOSIS — R262 Difficulty in walking, not elsewhere classified: Secondary | ICD-10-CM | POA: Diagnosis not present

## 2015-05-05 DIAGNOSIS — M545 Low back pain: Secondary | ICD-10-CM | POA: Diagnosis not present

## 2015-05-07 ENCOUNTER — Telehealth: Payer: Self-pay | Admitting: *Deleted

## 2015-05-07 DIAGNOSIS — M545 Low back pain: Secondary | ICD-10-CM | POA: Diagnosis not present

## 2015-05-07 DIAGNOSIS — M4326 Fusion of spine, lumbar region: Secondary | ICD-10-CM | POA: Diagnosis not present

## 2015-05-07 DIAGNOSIS — R262 Difficulty in walking, not elsewhere classified: Secondary | ICD-10-CM | POA: Diagnosis not present

## 2015-05-07 NOTE — Telephone Encounter (Signed)
Patient has a history of lymphoma. She feels tired and not well. She has been seen by her lupus MD who drew some labs. This MD explained to patient that her platelet count was decreased. Patient is concerned that her lymphoma may be coming back. Spoke to Dr Marin Olp who wants patient to come in for an appointment for evaluation. Message sent to scheduler and patient aware of plan.

## 2015-05-12 DIAGNOSIS — M545 Low back pain: Secondary | ICD-10-CM | POA: Diagnosis not present

## 2015-05-12 DIAGNOSIS — M4326 Fusion of spine, lumbar region: Secondary | ICD-10-CM | POA: Diagnosis not present

## 2015-05-12 DIAGNOSIS — R262 Difficulty in walking, not elsewhere classified: Secondary | ICD-10-CM | POA: Diagnosis not present

## 2015-05-14 DIAGNOSIS — M545 Low back pain: Secondary | ICD-10-CM | POA: Diagnosis not present

## 2015-05-14 DIAGNOSIS — M4326 Fusion of spine, lumbar region: Secondary | ICD-10-CM | POA: Diagnosis not present

## 2015-05-14 DIAGNOSIS — R262 Difficulty in walking, not elsewhere classified: Secondary | ICD-10-CM | POA: Diagnosis not present

## 2015-05-15 ENCOUNTER — Other Ambulatory Visit: Payer: Self-pay | Admitting: *Deleted

## 2015-05-15 DIAGNOSIS — C8300 Small cell B-cell lymphoma, unspecified site: Secondary | ICD-10-CM

## 2015-05-18 ENCOUNTER — Ambulatory Visit (HOSPITAL_BASED_OUTPATIENT_CLINIC_OR_DEPARTMENT_OTHER): Payer: Medicare Other | Admitting: Family

## 2015-05-18 ENCOUNTER — Encounter: Payer: Self-pay | Admitting: Family

## 2015-05-18 ENCOUNTER — Other Ambulatory Visit (HOSPITAL_BASED_OUTPATIENT_CLINIC_OR_DEPARTMENT_OTHER): Payer: Medicare Other

## 2015-05-18 VITALS — BP 168/82 | HR 86 | Temp 98.3°F | Resp 16 | Ht 65.0 in | Wt 193.0 lb

## 2015-05-18 DIAGNOSIS — C8300 Small cell B-cell lymphoma, unspecified site: Secondary | ICD-10-CM

## 2015-05-18 DIAGNOSIS — M545 Low back pain: Secondary | ICD-10-CM | POA: Diagnosis not present

## 2015-05-18 DIAGNOSIS — M4326 Fusion of spine, lumbar region: Secondary | ICD-10-CM | POA: Diagnosis not present

## 2015-05-18 DIAGNOSIS — Z8572 Personal history of non-Hodgkin lymphomas: Secondary | ICD-10-CM | POA: Diagnosis not present

## 2015-05-18 DIAGNOSIS — R262 Difficulty in walking, not elsewhere classified: Secondary | ICD-10-CM | POA: Diagnosis not present

## 2015-05-18 DIAGNOSIS — C8380 Other non-follicular lymphoma, unspecified site: Secondary | ICD-10-CM | POA: Diagnosis not present

## 2015-05-18 LAB — COMPREHENSIVE METABOLIC PANEL (CC13)
ALBUMIN: 3.4 g/dL — AB (ref 3.5–5.0)
ALK PHOS: 122 U/L (ref 40–150)
ALT: 13 U/L (ref 0–55)
ANION GAP: 8 meq/L (ref 3–11)
AST: 17 U/L (ref 5–34)
BILIRUBIN TOTAL: 0.72 mg/dL (ref 0.20–1.20)
BUN: 12.6 mg/dL (ref 7.0–26.0)
CO2: 26 meq/L (ref 22–29)
CREATININE: 0.8 mg/dL (ref 0.6–1.1)
Calcium: 9.1 mg/dL (ref 8.4–10.4)
Chloride: 108 mEq/L (ref 98–109)
EGFR: 85 mL/min/{1.73_m2} — AB (ref >90)
GLUCOSE: 90 mg/dL (ref 70–140)
Potassium: 3.6 mEq/L (ref 3.5–5.1)
Sodium: 142 mEq/L (ref 136–145)
TOTAL PROTEIN: 6.3 g/dL — AB (ref 6.4–8.3)

## 2015-05-18 LAB — CBC WITH DIFFERENTIAL (CANCER CENTER ONLY)
BASO#: 0 10*3/uL (ref 0.0–0.2)
BASO%: 0.2 % (ref 0.0–2.0)
EOS ABS: 0 10*3/uL (ref 0.0–0.5)
EOS%: 0.2 % (ref 0.0–7.0)
HCT: 37.6 % (ref 34.8–46.6)
HEMOGLOBIN: 12.2 g/dL (ref 11.6–15.9)
LYMPH#: 1.1 10*3/uL (ref 0.9–3.3)
LYMPH%: 17.7 % (ref 14.0–48.0)
MCH: 30.2 pg (ref 26.0–34.0)
MCHC: 32.4 g/dL (ref 32.0–36.0)
MCV: 93 fL (ref 81–101)
MONO#: 0.3 10*3/uL (ref 0.1–0.9)
MONO%: 4.8 % (ref 0.0–13.0)
NEUT#: 5 10*3/uL (ref 1.5–6.5)
NEUT%: 77.1 % (ref 39.6–80.0)
PLATELETS: 147 10*3/uL (ref 145–400)
RBC: 4.04 10*6/uL (ref 3.70–5.32)
RDW: 13.4 % (ref 11.1–15.7)
WBC: 6.5 10*3/uL (ref 3.9–10.0)

## 2015-05-18 LAB — CHCC SATELLITE - SMEAR

## 2015-05-18 NOTE — Progress Notes (Signed)
Hematology and Oncology Follow Up Visit  [name] [id number] Sep 11, 1950 64 y.o. 05/18/2015   Principle Diagnosis:  1. Lymphoplasmacytic lymphoma - clinical remission 2. Anemia secondary to renal insufficiency 3. Systemic lupus  Current Therapy:   Observation    Interim History:  [name] is here today for a follow-up. She was recently seen by her rheumatologist and had a low platelet count at 140. She is now back within normal range at 147. She was asymptomatic with this.  She has been under a great deal of stress at home. Unfortunately, they had a water leak and had to move out or their house for over a month. She has recently moved back in but the house is still under Architect. She has not been sleeping well because of all this and is feeling fatigued as a result.  She is eating well and staying hydrated. Her weight is down 7 lbs since her last visit.  She has some mild numbness and tingling in her fingertips that she states is unchanged. No swelling or tenderness in her extremities.  She has had no problem with infections. No fever, chills, n/v, cough, rash, dizziness, SOB, chest pain, palpitations, abdominal pain or changes in bowel or bladder habits.  No episodes of bleeding. No lymphadenopathy found on assessment.  Her mammogram in October was negative.  Medications:    Medication List       This list is accurate as of: 05/18/15  2:25 PM.  Always use your most recent med list.               ARTIFICIAL TEARS OP  Apply 1 drop to eye 4 (four) times daily as needed (for dry eyes).     aspirin 81 MG tablet  Take 81 mg by mouth daily.     Biotin 1000 MCG tablet  Take 1,000 mcg by mouth daily.     CALCIUM 600 + D PO  Take 1 tablet by mouth daily.     calcium carbonate 600 MG Tabs tablet  Commonly known as:  OS-CAL  Take 600 mg by mouth daily.     cholecalciferol 1000 UNITS tablet  Commonly known as:  VITAMIN D  Take 1,000 Units by mouth daily.     FIBER PO  Take 1 tablet by mouth daily.     furosemide 80 MG tablet  Commonly known as:  LASIX  Take 80 mg by mouth daily.     hydroxychloroquine 200 MG tablet  Commonly known as:  PLAQUENIL  Take 400 mg by mouth daily.     losartan 100 MG tablet  Commonly known as:  COZAAR  Take 100 mg by mouth daily.     losartan 25 MG tablet  Commonly known as:  COZAAR  Take 25 mg by mouth daily.     multivitamin tablet  Take 1 tablet by mouth daily.     pantoprazole 40 MG tablet  Commonly known as:  PROTONIX  Take 40 mg by mouth daily.     potassium chloride SA 20 MEQ tablet  Commonly known as:  K-DUR,KLOR-CON  Take 40 mEq by mouth daily.     predniSONE 1 MG tablet  Commonly known as:  DELTASONE     PREMARIN vaginal cream  Generic drug:  conjugated estrogens  Place 0.5 g vaginally 4 (four) times a week.        Allergies:  Allergies  Allergen Reactions  . Ciprofloxacin Itching  . E-Mycin [Erythromycin Base] Nausea And Vomiting  .  Oxycodone Other (See Comments)    Hallucination.    Past Medical History, Surgical history, Social history, and Family History were reviewed and updated.  Review of Systems: All other 10 point review of systems is negative.   Physical Exam:  height is 5\' 5"  (1.651 m) and weight is 193 lb (87.544 kg). Her oral temperature is 98.3 F (36.8 C). Her blood pressure is 168/82 and her pulse is 86. Her respiration is 16.   Wt Readings from Last 3 Encounters:  05/18/15 193 lb (87.544 kg)  12/11/14 200 lb 4 oz (90.833 kg)  12/04/14 200 lb 4.8 oz (90.855 kg)    Ocular: Sclerae unicteric, pupils equal, round and reactive to light Ear-nose-throat: Oropharynx clear, dentition fair Lymphatic: No cervical or supraclavicular adenopathy Lungs no rales or rhonchi, good excursion bilaterally Heart regular rate and rhythm, no murmur appreciated Abd soft, nontender, positive bowel sounds MSK no focal spinal tenderness, no joint edema Neuro: non-focal,  well-oriented, appropriate affect Breasts: Deferred  Lab Results  Component Value Date   WBC 6.5 05/18/2015   HGB 12.2 05/18/2015   HCT 37.6 05/18/2015   MCV 93 05/18/2015   PLT 147 05/18/2015   Lab Results  Component Value Date   FERRITIN 420* 07/18/2013   IRON 73 07/18/2013   TIBC 230* 07/18/2013   UIBC 158 07/18/2013   IRONPCTSAT 32 07/18/2013   Lab Results  Component Value Date   RETICCTPCT 2.0 03/09/2011   RBC 4.04 05/18/2015   RETICCTABS 70.0 03/09/2011   Lab Results  Component Value Date   KPAFRELGTCHN 1.70 07/18/2013   LAMBDASER 2.93* 07/18/2013   KAPLAMBRATIO 0.58 07/18/2013   Lab Results  Component Value Date   IGGSERUM 611* 07/18/2013   IGA 118 07/18/2013   IGMSERUM <4* 07/18/2013   Lab Results  Component Value Date   TOTALPROTELP 6.2 07/18/2013   ALBUMINELP 57.9 07/18/2013   A1GS 5.5* 07/18/2013   A2GS 16.3* 07/18/2013   BETS 6.2 07/18/2013   BETA2SER 4.8 07/18/2013   GAMS 9.3* 07/18/2013   MSPIKE NOT DET 07/18/2013   SPEI * 07/18/2013     Chemistry      Component Value Date/Time   NA 141 12/04/2014 0950   NA 144 01/15/2013 0844   K 3.2* 12/04/2014 0950   K 3.4 01/15/2013 0844   CL 106 12/04/2014 0950   CL 104 01/15/2013 0844   CO2 26 12/04/2014 0950   CO2 31 01/15/2013 0844   BUN 16 12/04/2014 0950   BUN 11 01/15/2013 0844   CREATININE 1.04* 12/04/2014 0950   CREATININE 0.9 01/15/2013 0844      Component Value Date/Time   CALCIUM 8.7* 12/04/2014 0950   CALCIUM 8.9 01/15/2013 0844   ALKPHOS 94 12/04/2014 0950   ALKPHOS 95* 01/15/2013 0844   AST 16 12/04/2014 0950   AST 19 01/15/2013 0844   ALT 13* 12/04/2014 0950   ALT 16 01/15/2013 0844   BILITOT 0.7 12/04/2014 0950   BILITOT 0.80 01/15/2013 0844     Impression and Plan: She has a history of lymphoplasmacytic lymphoma. She was treated with FCR until went into remission and then had maintenance Rituxan. She completed her treatment in March 2010. So far, she has done well and  there has been no evidence of recurrence.  During routine blood work with her rheumatologist she was fount to have a low platelet count at 140. She is now back up within normal limits at 147. She is asymptomatic at this time.  Dr. Marin Olp viewed  her blood smear and was unable to identify any abnormality or evidence of malignancy. The rest of her CBC was unremarkable.  All questions were answered.  We will continue to follow-up with her as needed. She will contact us with any hem/onc related needs in the future. We would be happy to see her again if she needs [place].   Eliezer Bottom, NP 11/14/20162:25 PM

## 2015-05-19 LAB — IRON AND TIBC CHCC
%SAT: 19 % — AB (ref 21–57)
Iron: 39 ug/dL — ABNORMAL LOW (ref 41–142)
TIBC: 208 ug/dL — AB (ref 236–444)
UIBC: 168 ug/dL (ref 120–384)

## 2015-05-19 LAB — FERRITIN CHCC: Ferritin: 378 ng/ml — ABNORMAL HIGH (ref 9–269)

## 2015-05-20 DIAGNOSIS — M545 Low back pain: Secondary | ICD-10-CM | POA: Diagnosis not present

## 2015-05-20 DIAGNOSIS — M4326 Fusion of spine, lumbar region: Secondary | ICD-10-CM | POA: Diagnosis not present

## 2015-05-20 DIAGNOSIS — R262 Difficulty in walking, not elsewhere classified: Secondary | ICD-10-CM | POA: Diagnosis not present

## 2015-05-20 LAB — IGG, IGA, IGM
IGA: 118 mg/dL (ref 69–380)
IgG (Immunoglobin G), Serum: 714 mg/dL (ref 690–1700)

## 2015-05-20 LAB — PROTEIN ELECTROPHORESIS, SERUM
ALBUMIN ELP: 3.7 g/dL — AB (ref 3.8–4.8)
ALPHA-1-GLOBULIN: 0.3 g/dL (ref 0.2–0.3)
Alpha-2-Globulin: 0.9 g/dL (ref 0.5–0.9)
BETA 2: 0.3 g/dL (ref 0.2–0.5)
Beta Globulin: 0.4 g/dL (ref 0.4–0.6)
GAMMA GLOBULIN: 0.6 g/dL — AB (ref 0.8–1.7)
TOTAL PROTEIN, SERUM ELECTROPHOR: 6.2 g/dL (ref 6.1–8.1)

## 2015-05-20 LAB — KAPPA/LAMBDA LIGHT CHAINS
Kappa free light chain: 1.75 mg/dL (ref 0.33–1.94)
Kappa:Lambda Ratio: 0.84 (ref 0.26–1.65)
Lambda Free Lght Chn: 2.08 mg/dL (ref 0.57–2.63)

## 2015-05-25 DIAGNOSIS — M545 Low back pain: Secondary | ICD-10-CM | POA: Diagnosis not present

## 2015-05-25 DIAGNOSIS — M4326 Fusion of spine, lumbar region: Secondary | ICD-10-CM | POA: Diagnosis not present

## 2015-05-25 DIAGNOSIS — R262 Difficulty in walking, not elsewhere classified: Secondary | ICD-10-CM | POA: Diagnosis not present

## 2015-05-27 DIAGNOSIS — R262 Difficulty in walking, not elsewhere classified: Secondary | ICD-10-CM | POA: Diagnosis not present

## 2015-05-27 DIAGNOSIS — M4326 Fusion of spine, lumbar region: Secondary | ICD-10-CM | POA: Diagnosis not present

## 2015-05-27 DIAGNOSIS — M545 Low back pain: Secondary | ICD-10-CM | POA: Diagnosis not present

## 2015-06-12 DIAGNOSIS — R600 Localized edema: Secondary | ICD-10-CM | POA: Diagnosis not present

## 2015-06-12 DIAGNOSIS — N390 Urinary tract infection, site not specified: Secondary | ICD-10-CM | POA: Diagnosis not present

## 2015-06-12 DIAGNOSIS — C8300 Small cell B-cell lymphoma, unspecified site: Secondary | ICD-10-CM | POA: Diagnosis not present

## 2015-06-12 DIAGNOSIS — D696 Thrombocytopenia, unspecified: Secondary | ICD-10-CM | POA: Diagnosis not present

## 2015-06-12 DIAGNOSIS — M5136 Other intervertebral disc degeneration, lumbar region: Secondary | ICD-10-CM | POA: Diagnosis not present

## 2015-06-12 DIAGNOSIS — E876 Hypokalemia: Secondary | ICD-10-CM | POA: Diagnosis not present

## 2015-06-12 DIAGNOSIS — Z0000 Encounter for general adult medical examination without abnormal findings: Secondary | ICD-10-CM | POA: Diagnosis not present

## 2015-06-12 DIAGNOSIS — I10 Essential (primary) hypertension: Secondary | ICD-10-CM | POA: Diagnosis not present

## 2015-06-25 DIAGNOSIS — J4520 Mild intermittent asthma, uncomplicated: Secondary | ICD-10-CM | POA: Diagnosis not present

## 2015-06-25 DIAGNOSIS — D696 Thrombocytopenia, unspecified: Secondary | ICD-10-CM | POA: Diagnosis not present

## 2015-06-25 DIAGNOSIS — E876 Hypokalemia: Secondary | ICD-10-CM | POA: Diagnosis not present

## 2015-06-25 DIAGNOSIS — I10 Essential (primary) hypertension: Secondary | ICD-10-CM | POA: Diagnosis not present

## 2015-10-12 DIAGNOSIS — T7840XA Allergy, unspecified, initial encounter: Secondary | ICD-10-CM | POA: Diagnosis not present

## 2015-10-12 DIAGNOSIS — J329 Chronic sinusitis, unspecified: Secondary | ICD-10-CM | POA: Diagnosis not present

## 2015-10-22 ENCOUNTER — Ambulatory Visit (INDEPENDENT_AMBULATORY_CARE_PROVIDER_SITE_OTHER)
Admission: RE | Admit: 2015-10-22 | Discharge: 2015-10-22 | Disposition: A | Discharge status: Home / Self Care | Payer: Medicare Other | Source: Ambulatory Visit | Attending: Family Medicine | Admitting: Family Medicine

## 2015-10-22 ENCOUNTER — Encounter: Payer: Self-pay | Admitting: Family Medicine

## 2015-10-22 ENCOUNTER — Ambulatory Visit (INDEPENDENT_AMBULATORY_CARE_PROVIDER_SITE_OTHER): Payer: Medicare Other | Admitting: Family Medicine

## 2015-10-22 VITALS — BP 128/80 | HR 71 | Ht 65.0 in | Wt 195.0 lb

## 2015-10-22 DIAGNOSIS — M25561 Pain in right knee: Secondary | ICD-10-CM | POA: Diagnosis not present

## 2015-10-22 DIAGNOSIS — M25562 Pain in left knee: Secondary | ICD-10-CM

## 2015-10-22 DIAGNOSIS — M13169 Monoarthritis, not elsewhere classified, unspecified knee: Secondary | ICD-10-CM

## 2015-10-22 DIAGNOSIS — M170 Bilateral primary osteoarthritis of knee: Secondary | ICD-10-CM | POA: Diagnosis not present

## 2015-10-22 DIAGNOSIS — M1710 Unilateral primary osteoarthritis, unspecified knee: Secondary | ICD-10-CM | POA: Insufficient documentation

## 2015-10-22 NOTE — Patient Instructions (Signed)
Good to see you.  Ice 20 minutes 2 times daily. Usually after activity and before bed. Exercises 3 times a week.  pennsaid pinkie amount topically 2 times daily as needed.  Xrays downstairs Stay active  Vitamin D 4000 IU daily  Turmeric 500mg  daily  Things like biking and elliptical would be better for cardio See me again in 4 weeks and if not better I may have other injections.

## 2015-10-22 NOTE — Progress Notes (Signed)
Pre visit review using our clinic review tool, if applicable. No additional management support is needed unless otherwise documented below in the visit note. 

## 2015-10-22 NOTE — Progress Notes (Signed)
[name] [place] [address], [place] [address] Phone: [phone] Subjective:     CC: bilateral knee pain [id number] [name] is a 65 y.o. female coming in with complaint of bilateral knee pain. Has had this for multiple years. Did have injection greater than a year ago by another provider and states that they were very good in for the last 6 months though has started increasing pain. States that it seems to be on the lateral aspect of the knees. Hurts more going from a seated to standing position. Patient states going up and down stairs can be difficult. Sometimes associated with swelling. No radiation down the leg. No numbness. Rates the severity pain as 7 out of 10. Seems to be worsening. Sometimes has some what appears to be instability of the knees. Past pedicle history significant for lupus and she is on Plaquenil.     Past Medical History  Diagnosis Date  . 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 Lupus ([place]) 07/13/2011  . Asthma   . Acid reflux   . Hypertension   . Stroke [facility])     lt side weakness  . Arthritis    Past Surgical History  Procedure Laterality Date  . Abdominal hysterectomy    . Mass excision      back  . Foot surgery      x2  . Cholecystectomy    . Back surgery    . Rortator      rotator cuff   Social History   Social History  . Marital Status: Married    Spouse Name: N/A  . Number of Children: N/A  . Years of Education: N/A   Social History Main Topics  . Smoking status: Former Smoker -- 1.00 packs/day for 10 years    Quit date: 06/17/1994  . Smokeless tobacco: Never Used  . Alcohol Use: 0.0 oz/week    0 Standard drinks or equivalent per week  . Drug Use: No  . Sexual Activity: Not Currently   Other Topics Concern  . None   Social History Narrative   Allergies  Allergen Reactions  . Ciprofloxacin Itching  . E-Mycin [Erythromycin Base] Nausea And Vomiting  . Oxycodone Other  (See Comments)    Hallucination.   No family history on file.  Past medical history, social, surgical and family history all reviewed in electronic medical record.  No pertanent information unless stated regarding to the chief complaint.   Review of Systems: No headache, visual changes, nausea, vomiting, diarrhea, constipation, dizziness, abdominal pain, skin rash, fevers, chills, night sweats, weight loss, swollen lymph nodes, body aches, joint swelling, muscle aches, chest pain, shortness of breath, mood changes.   Objective Blood pressure 128/80, pulse 71, height 5\' 5"  (1.651 m), weight 195 lb (88.451 kg), SpO2 99 %.  General: No apparent distress alert and oriented x3 mood and affect normal, dressed appropriately.  HEENT: Pupils equal, extraocular movements intact  Respiratory: Patient's speak in full sentences and does not appear short of breath  Cardiovascular: No lower extremity edema, non tender, no erythema  Skin: Warm dry intact with no signs of infection or rash on extremities or on axial skeleton.  Abdomen: Soft nontender  Neuro: Cranial nerves II through XII are intact, neurovascularly intact in all extremities with 2+ DTRs and 2+ pulses.  Lymph: No lymphadenopathy of posterior or anterior cervical chain or axillae bilaterally.  Gait normal with good balance and coordination.  MSK:  Non tender with  full range of motion and good stability and symmetric strength and tone of shoulders, elbows, wrist, hip, and ankles bilaterally.  Knee:bilateral Mild valgus deformity bilaterally Mild tenderness mostly over the patellofemoral joint ROM full in flexion and extension and lower leg rotation. Ligaments with solid consistent endpoints including ACL, PCL, LCL, MCL. Negative Mcmurray's, Apley's, and Thessalonian tests.  painful patellar compression. Patellar glide with moderatecrepitus. Patellar and quadriceps tendons unremarkable. Hamstring and quadriceps strength is normal.   After  informed written and verbal consent, patient was seated on exam table. Right knee was prepped with alcohol swab and utilizing anterolateral approach, patient's right knee space was injected with 4:1  marcaine 0.5%: Kenalog 40mg /dL. Patient tolerated the procedure well without immediate complications.  After informed written and verbal consent, patient was seated on exam table. Left knee was prepped with alcohol swab and utilizing anterolateral approach, patient's left knee space was injected with 4:1  marcaine 0.5%: Kenalog 40mg /dL. Patient tolerated the procedure well without immediate complications.   Impression and Recommendations:     This case required medical decision making of moderate complexity.      Note: This dictation was prepared with Dragon dictation along with smaller phrase technology. Any transcriptional errors that result from this process are unintentional.

## 2015-10-22 NOTE — Assessment & Plan Note (Signed)
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 I think that this is contribute in. X-rays ordered today to further evaluate the medial and lateral compartments. Patient work with Product/process development scientist to learn home exercises in greater detail. We discussed icing regimen. We discussed topical anti-inflammatories as well as which activities to avoid. Patient will try some over-the-counter medications. Follow-up with me again in 4 weeks for further evaluation. Patient could be a candidate for viscous supplementation as well as formal physical therapy.

## 2015-10-26 DIAGNOSIS — M150 Primary generalized (osteo)arthritis: Secondary | ICD-10-CM | POA: Diagnosis not present

## 2015-10-26 DIAGNOSIS — Z79899 Other long term (current) drug therapy: Secondary | ICD-10-CM | POA: Diagnosis not present

## 2015-10-26 DIAGNOSIS — M3501 Sicca syndrome with keratoconjunctivitis: Secondary | ICD-10-CM | POA: Diagnosis not present

## 2015-10-26 DIAGNOSIS — R5381 Other malaise: Secondary | ICD-10-CM | POA: Diagnosis not present

## 2015-11-05 DIAGNOSIS — I10 Essential (primary) hypertension: Secondary | ICD-10-CM | POA: Diagnosis not present

## 2015-11-05 DIAGNOSIS — E876 Hypokalemia: Secondary | ICD-10-CM | POA: Diagnosis not present

## 2015-11-05 DIAGNOSIS — D696 Thrombocytopenia, unspecified: Secondary | ICD-10-CM | POA: Diagnosis not present

## 2015-11-11 DIAGNOSIS — Z79899 Other long term (current) drug therapy: Secondary | ICD-10-CM | POA: Diagnosis not present

## 2015-11-11 DIAGNOSIS — H40013 Open angle with borderline findings, low risk, bilateral: Secondary | ICD-10-CM | POA: Diagnosis not present

## 2015-11-11 DIAGNOSIS — H2513 Age-related nuclear cataract, bilateral: Secondary | ICD-10-CM | POA: Diagnosis not present

## 2015-11-11 DIAGNOSIS — H524 Presbyopia: Secondary | ICD-10-CM | POA: Diagnosis not present

## 2015-11-12 DIAGNOSIS — J4520 Mild intermittent asthma, uncomplicated: Secondary | ICD-10-CM | POA: Diagnosis not present

## 2015-11-12 DIAGNOSIS — D696 Thrombocytopenia, unspecified: Secondary | ICD-10-CM | POA: Diagnosis not present

## 2015-11-12 DIAGNOSIS — J069 Acute upper respiratory infection, unspecified: Secondary | ICD-10-CM | POA: Diagnosis not present

## 2015-11-12 DIAGNOSIS — I10 Essential (primary) hypertension: Secondary | ICD-10-CM | POA: Diagnosis not present

## 2015-11-18 DIAGNOSIS — H40011 Open angle with borderline findings, low risk, right eye: Secondary | ICD-10-CM | POA: Diagnosis not present

## 2015-11-18 DIAGNOSIS — H40012 Open angle with borderline findings, low risk, left eye: Secondary | ICD-10-CM | POA: Diagnosis not present

## 2015-11-19 ENCOUNTER — Encounter: Payer: Self-pay | Admitting: Family Medicine

## 2015-11-19 ENCOUNTER — Ambulatory Visit (INDEPENDENT_AMBULATORY_CARE_PROVIDER_SITE_OTHER): Payer: Medicare Other | Admitting: Family Medicine

## 2015-11-19 VITALS — BP 110/82 | HR 82 | Ht 65.0 in | Wt 197.0 lb

## 2015-11-19 DIAGNOSIS — M13169 Monoarthritis, not elsewhere classified, unspecified knee: Secondary | ICD-10-CM | POA: Diagnosis not present

## 2015-11-19 NOTE — Progress Notes (Signed)
Pre visit review using our clinic review tool, if applicable. No additional management support is needed unless otherwise documented below in the visit note. 

## 2015-11-19 NOTE — Patient Instructions (Signed)
Great see you  Ice will be your friend and always do it after your job pennsaid pinkie amount topically 2 times daily as needed.  Stay active Can repeat injections every 3 months if needed See me again in 2-3 months in case you need the injections otherwise see me when you need me.

## 2015-11-19 NOTE — Assessment & Plan Note (Signed)
Improved after the injection. We'll continue to monitor. If worsening symptoms she would be a candidate for viscous supplementation. Encourage her to continue to have strict treatment of her autoimmune diseases to delay any joint progression. Patient will come back and see me again in 2 months for further evaluation. At that time we can repeat steroid injections if needed.

## 2015-11-19 NOTE — Progress Notes (Signed)
[name] [address] [place], [place] [address] Phone: [phone] Subjective:     CC: bilateral knee pain f/u  [id number] [name] is a 65 y.o. female coming in with complaint of bilateral knee pain. 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 Was given an injection in the knees bilaterally. An states that she is feeling 90% better. Overall feeling much better. Making progress. Increasing her activity. Doing some the activities and  icing. Overall happy with the results.    Past Medical History  Diagnosis Date  . 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 Lupus ([place]) 07/13/2011  . Asthma   . Acid reflux   . Hypertension   . Stroke [facility])     lt side weakness  . Arthritis    Past Surgical History  Procedure Laterality Date  . Abdominal hysterectomy    . Mass excision      back  . Foot surgery      x2  . Cholecystectomy    . Back surgery    . Rortator      rotator cuff   Social History   Social History  . Marital Status: Married    Spouse Name: N/A  . Number of Children: N/A  . Years of Education: N/A   Social History Main Topics  . Smoking status: Former Smoker -- 1.00 packs/day for 10 years    Quit date: 06/17/1994  . Smokeless tobacco: Never Used  . Alcohol Use: 0.0 oz/week    0 Standard drinks or equivalent per week  . Drug Use: No  . Sexual Activity: Not Currently   Other Topics Concern  . None   Social History Narrative   Allergies  Allergen Reactions  . Ciprofloxacin Itching  . E-Mycin [Erythromycin Base] Nausea And Vomiting  . Oxycodone Other (See Comments)    Hallucination.   No family history on file. Hx of RA and lupus in family.  Past medical history, social, surgical and family history all reviewed in electronic medical record.  No pertanent information unless stated regarding to the chief complaint.   Review of Systems: No headache, visual changes, nausea, vomiting,  diarrhea, constipation, dizziness, abdominal pain, skin rash, fevers, chills, night sweats, weight loss, swollen lymph nodes, body aches, joint swelling, muscle aches, chest pain, shortness of breath, mood changes.   Objective Blood pressure 110/82, pulse 82, height 5\' 5"  (1.651 m), weight 197 lb (89.359 kg), SpO2 96 %.  General: No apparent distress alert and oriented x3 mood and affect normal, dressed appropriately.  HEENT: Pupils equal, extraocular movements intact  Respiratory: Patient's speak in full sentences and does not appear short of breath  Cardiovascular: No lower extremity edema, non tender, no erythema  Skin: Warm dry intact with no signs of infection or rash on extremities or on axial skeleton.  Abdomen: Soft nontender  Neuro: Cranial nerves II through XII are intact, neurovascularly intact in all extremities with 2+ DTRs and 2+ pulses.  Lymph: No lymphadenopathy of posterior or anterior cervical chain or axillae bilaterally.  Gait normal with good balance and coordination.  MSK:  Non tender with full range of motion and good stability and symmetric strength and tone of shoulders, elbows, wrist, hip, and ankles bilaterally.  Knee:bilateral Mild valgus deformity bilaterally Mild tenderness mostly over the patellofemoral joint still remaining but improved ROM full in flexion and extension and lower leg rotation. Ligaments with solid consistent endpoints including ACL, PCL, LCL, MCL. Negative Mcmurray's, Apley's, and  Thessalonian tests.  painful patellar compression but also improved. Patellar glide with mild crepitus. Patellar and quadriceps tendons unremarkable. Hamstring and quadriceps strength is normal.      Impression and Recommendations:     This case required medical decision making of moderate complexity.      Note: This dictation was prepared with Dragon dictation along with smaller phrase technology. Any transcriptional errors that result from this process  are unintentional.

## 2015-11-26 DIAGNOSIS — R05 Cough: Secondary | ICD-10-CM | POA: Diagnosis not present

## 2015-11-26 DIAGNOSIS — J069 Acute upper respiratory infection, unspecified: Secondary | ICD-10-CM | POA: Diagnosis not present

## 2015-12-22 DIAGNOSIS — H40011 Open angle with borderline findings, low risk, right eye: Secondary | ICD-10-CM | POA: Diagnosis not present

## 2015-12-29 DIAGNOSIS — M545 Low back pain: Secondary | ICD-10-CM | POA: Diagnosis not present

## 2016-02-02 ENCOUNTER — Ambulatory Visit: Payer: Medicare Other | Admitting: Family Medicine

## 2016-02-02 NOTE — Progress Notes (Signed)
[name] [address], [place] [address] Phone: [phone] Subjective:     CC: bilateral knee pain f/u  [id number]  [name] is a 65 y.o. female coming in with complaint of bilateral knee pain. 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 Last injection was greater than 3 months ago. Patient states He's had been doing very well until a recent fall. Patient did fall directly onto the anterior aspect of the right knee. In significant pain immediately. Was seen in emergency department when she was in [place]. Patient was told of the bruising was sent home. Patient has been in 2 weeks and continues to have pain. Was doing fairly well before this fall though. Patient states going up and downstairs seems to exacerbate it significant. States that it is popping more frequently but not causing instability.   Previous x-rays were independently visualized by me showing mild to moderate degenerative changes in all 3 compartments of the knees bilaterally.  Past Medical History:  Diagnosis Date  . Acid reflux   . Arthritis   . Asthma   . Hypertension   . Lupus ([place]) 07/13/2011  . 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 Stroke [facility])    lt side weakness   Past Surgical History:  Procedure Laterality Date  . ABDOMINAL HYSTERECTOMY    . BACK SURGERY    . CHOLECYSTECTOMY    . FOOT SURGERY     x2  . MASS EXCISION     back  . rortator     rotator cuff   Social History   Social History  . Marital status: Married    Spouse name: N/A  . Number of children: N/A  . Years of education: N/A   Social History Main Topics  . Smoking status: Former Smoker    Packs/day: 1.00    Years: 10.00    Quit date: 06/17/1994  . Smokeless tobacco: Never Used  . Alcohol use 0.0 oz/week  . Drug use: No  . Sexual activity: Not Currently   Other Topics Concern  . None   Social History Narrative  . None   Allergies    Allergen Reactions  . Ciprofloxacin Itching  . E-Mycin [Erythromycin Base] Nausea And Vomiting  . Oxycodone Other (See Comments)    Hallucination.   No family history on file. Hx of RA and lupus in family.  Past medical history, social, surgical and family history all reviewed in electronic medical record.  No pertanent information unless stated regarding to the chief complaint.   Review of Systems: No headache, visual changes, nausea, vomiting, diarrhea, constipation, dizziness, abdominal pain, skin rash, fevers, chills, night sweats, weight loss, swollen lymph nodes,  chest pain, shortness of breath, mood changes.   Objective  Blood pressure 140/82, pulse 78, weight 205 lb (93 kg).  General: No apparent distress alert and oriented x3 mood and affect normal, dressed appropriately.  HEENT: Pupils equal, extraocular movements intact  Respiratory: Patient's speak in full sentences and does not appear short of breath  Cardiovascular: No lower extremity edema, non tender, no erythema  Skin: Warm dry intact with no signs of infection or rash on extremities or on axial skeleton.  Abdomen: Soft nontender  Neuro: Cranial nerves II through XII are intact, neurovascularly intact in all extremities with 2+ DTRs and 2+ pulses.  Lymph: No lymphadenopathy of posterior or anterior cervical chain or axillae bilaterally.  Gait normal with good balance and coordination.  MSK:  Non tender with full range of motion and good stability and symmetric strength and tone of shoulders, elbows, wrist, hip, and ankles bilaterally.  Knee:Right knee Mild valgus deformity bilaterally. Does have abrasion on the anterior aspect of the patella that seems to be healing Moderate tenderness over the patella itself. No crepitus felt with palpation. ROM full in flexion and extension and lower leg rotation. Ligaments with solid consistent endpoints including ACL, PCL, LCL, MCL. Negative Mcmurray's, Apley's, and Thessalonian  tests.  painful patellar compression but also improved. Patellar glide with mild crepitus. Patellar and quadriceps tendons unremarkable. Hamstring and quadriceps strength is normal.  Contralateral knee seems to be doing relatively well with mild instability and osteophytic changes.    Impression and Recommendations:     This case required medical decision making of moderate complexity.      Note: This dictation was prepared with Dragon dictation along with smaller phrase technology. Any transcriptional errors that result from this process are unintentional.

## 2016-02-03 ENCOUNTER — Encounter: Payer: Self-pay | Admitting: Family Medicine

## 2016-02-03 ENCOUNTER — Telehealth: Payer: Self-pay | Admitting: Emergency Medicine

## 2016-02-03 ENCOUNTER — Ambulatory Visit (INDEPENDENT_AMBULATORY_CARE_PROVIDER_SITE_OTHER): Payer: Medicare Other | Admitting: Family Medicine

## 2016-02-03 DIAGNOSIS — M13169 Monoarthritis, not elsewhere classified, unspecified knee: Secondary | ICD-10-CM

## 2016-02-03 DIAGNOSIS — E669 Obesity, unspecified: Secondary | ICD-10-CM | POA: Diagnosis not present

## 2016-02-03 DIAGNOSIS — S8001XA Contusion of right knee, initial encounter: Secondary | ICD-10-CM

## 2016-02-03 DIAGNOSIS — S8000XA Contusion of unspecified knee, initial encounter: Secondary | ICD-10-CM | POA: Insufficient documentation

## 2016-02-03 MED ORDER — VITAMIN D (ERGOCALCIFEROL) 1.25 MG (50000 UNIT) PO CAPS: 50000.0000 [IU] | ORAL_CAPSULE | ORAL | 0 refills | Status: DC

## 2016-02-03 NOTE — Telephone Encounter (Signed)
Pt stated her prescription was called in to [facility] but she would like them to be called into [facility]. Please follow up thanks.

## 2016-02-03 NOTE — Assessment & Plan Note (Signed)
Bilateral. Seems stable at the moment. Forcing symptoms can do another corticosteroid injection. Has been longer than 3 months since previous injection.

## 2016-02-03 NOTE — Assessment & Plan Note (Signed)
Patient does have what likely is a patella contusion. Possible underlying small fracture. No significant defect noted. We discussed with patient that we do not need any imaging because would not likely change management. Started on once weekly vitamin D. Discussed icing and topical anti-inflammatory's. Discussed which activities to avoid short course. Patient and will come back and see me again in 3 weeks. At that time if continuing have pain we'll consider further imaging.

## 2016-02-03 NOTE — Assessment & Plan Note (Signed)
Encourage weight loss. 

## 2016-02-03 NOTE — Telephone Encounter (Signed)
Resent rx into correct pharmacy. 

## 2016-02-03 NOTE — Patient Instructions (Addendum)
Good to see you  It has been 3 months since injections. Can repeat if needed.  Keep doing everything else you are doing.  You do have a small fracture of th e knee cap but will heel.  Once weekly vitamin D and stop the daily  Arnica lotion 2 times daily to the side of the hip for the bruising.  See me again in 3 weeks.

## 2016-02-04 MED ORDER — VITAMIN D (ERGOCALCIFEROL) 1.25 MG (50000 UNIT) PO CAPS: 50000.0000 [IU] | ORAL_CAPSULE | ORAL | 0 refills | Status: DC

## 2016-02-04 NOTE — Telephone Encounter (Signed)
Pt stated that [facility] still doesn't have her VIT D can you resend it?

## 2016-02-04 NOTE — Telephone Encounter (Signed)
Resent rx to pharmacy.

## 2016-02-04 NOTE — Addendum Note (Signed)
Addended by: Douglass Rivers T on: 02/04/2016 11:54 AM   Modules accepted: Orders

## 2016-02-12 DIAGNOSIS — L218 Other seborrheic dermatitis: Secondary | ICD-10-CM | POA: Diagnosis not present

## 2016-02-20 NOTE — Progress Notes (Signed)
[name] [address] [place], [name] [address] Phone: [phone] Subjective:     CC: bilateral knee pain f/u  [id number]  [name] is a 65 y.o. female coming in with complaint of bilateral knee pain. 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 Last injection was greater than 3 months ago. Seen last time from a fall and had a patella contusion with a potential small fracture.  Patient states Being anterior aspect of her knee seems to be doing better. States though that unfortunately her regular knee pain is worsening. Feels like they're giving out on her more regular basis.   Previous x-rays were independently visualized by me showing mild to moderate degenerative changes in all 3 compartments of the knees bilaterally.  Past Medical History:  Diagnosis Date  . Acid reflux   . Arthritis   . Asthma   . Hypertension   . Lupus ([place]) 07/13/2011  . 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 Stroke [facility])    lt side weakness   Past Surgical History:  Procedure Laterality Date  . ABDOMINAL HYSTERECTOMY    . BACK SURGERY    . CHOLECYSTECTOMY    . FOOT SURGERY     x2  . MASS EXCISION     back  . rortator     rotator cuff   Social History   Social History  . Marital status: Married    Spouse name: N/A  . Number of children: N/A  . Years of education: N/A   Social History Main Topics  . Smoking status: Former Smoker    Packs/day: 1.00    Years: 10.00    Quit date: 06/17/1994  . Smokeless tobacco: Never Used  . Alcohol use 0.0 oz/week  . Drug use: No  . Sexual activity: Not Currently   Other Topics Concern  . None   Social History Narrative  . None   Allergies  Allergen Reactions  . Ciprofloxacin Itching  . E-Mycin [Erythromycin Base] Nausea And Vomiting  . Oxycodone Other (See Comments)    Hallucination.   No family history on file. Hx of RA and lupus in family.  Past medical history, social,  surgical and family history all reviewed in electronic medical record.  No pertanent information unless stated regarding to the chief complaint.   Review of Systems: No headache, visual changes, nausea, vomiting, diarrhea, constipation, dizziness, abdominal pain, skin rash, fevers, chills, night sweats, weight loss, swollen lymph nodes,  chest pain, shortness of breath, mood changes.   Objective  Blood pressure 124/84, pulse 69, weight 204 lb (92.5 kg), SpO2 96 %.  General: No apparent distress alert and oriented x3 mood and affect normal, dressed appropriately.  HEENT: Pupils equal, extraocular movements intact  Respiratory: Patient's speak in full sentences and does not appear short of breath  Cardiovascular: No lower extremity edema, non tender, no erythema  Skin: Warm dry intact with no signs of infection or rash on extremities or on axial skeleton.  Abdomen: Soft nontender  Neuro: Cranial nerves II through XII are intact, neurovascularly intact in all extremities with 2+ DTRs and 2+ pulses.  Lymph: No lymphadenopathy of posterior or anterior cervical chain or axillae bilaterally.  Gait normal with good balance and coordination.  MSK:  Non tender with full range of motion and good stability and symmetric strength and tone of shoulders, elbows, wrist, hip, and ankles bilaterally.  Knee:Right knee Mild valgus deformity bilaterally. Patient's abrasion that she had previously is  fully healed Nontender over the patella today. Mild instability with valgus force.. Negative Mcmurray's, Apley's, and Thessalonian tests.  painful patellar compression but also improved. Patellar glide with mild crepitus. Patellar and quadriceps tendons unremarkable. Hamstring and quadriceps strength is normal.  Contralateral knee tender as well with mild instability and osteophytic changes.    After informed written and verbal consent, patient was seated on exam table. Right knee was prepped with alcohol swab and  utilizing anterolateral approach, patient's right knee space was injected with 4:1  marcaine 0.5%: Kenalog 40mg /dL. Patient tolerated the procedure well without immediate complications.  After informed written and verbal consent, patient was seated on exam table. Left knee was prepped with alcohol swab and utilizing anterolateral approach, patient's left knee space was injected with 4:1  marcaine 0.5%: Kenalog 40mg /dL. Patient tolerated the procedure well without immediate complications. Impression and Recommendations:     This case required medical decision making of moderate complexity.      Note: This dictation was prepared with Dragon dictation along with smaller phrase technology. Any transcriptional errors that result from this process are unintentional.

## 2016-02-22 ENCOUNTER — Ambulatory Visit (INDEPENDENT_AMBULATORY_CARE_PROVIDER_SITE_OTHER): Payer: Medicare Other | Admitting: Family Medicine

## 2016-02-22 ENCOUNTER — Encounter: Payer: Self-pay | Admitting: Family Medicine

## 2016-02-22 ENCOUNTER — Other Ambulatory Visit (HOSPITAL_COMMUNITY): Payer: Self-pay | Admitting: Internal Medicine

## 2016-02-22 DIAGNOSIS — R600 Localized edema: Secondary | ICD-10-CM | POA: Diagnosis not present

## 2016-02-22 DIAGNOSIS — M170 Bilateral primary osteoarthritis of knee: Secondary | ICD-10-CM

## 2016-02-22 DIAGNOSIS — L0390 Cellulitis, unspecified: Secondary | ICD-10-CM | POA: Diagnosis not present

## 2016-02-22 DIAGNOSIS — M7989 Other specified soft tissue disorders: Secondary | ICD-10-CM | POA: Diagnosis not present

## 2016-02-22 DIAGNOSIS — R609 Edema, unspecified: Secondary | ICD-10-CM

## 2016-02-22 NOTE — Assessment & Plan Note (Signed)
Worsening symptoms. Bilateral injections given today. We discussed icing regimen, home exercises. Patient will continue on her Plaquenil for her lupus. Likely this will exacerbate some of the arthritic changes. We discussed continuing have pain at follow-up in 3-4 weeks we will consider viscous supplementation. Has failed all other conservative therapy.

## 2016-02-22 NOTE — Patient Instructions (Addendum)
Good to see you [name] is your friend Stay active.  See me again in 3 weeks and if not better we will do other injections.

## 2016-02-23 ENCOUNTER — Encounter (HOSPITAL_COMMUNITY): Payer: Medicare Other

## 2016-02-23 DIAGNOSIS — B0089 Other herpesviral infection: Secondary | ICD-10-CM | POA: Diagnosis not present

## 2016-03-14 NOTE — Progress Notes (Signed)
[name] [address] [place], [place] [address] Phone: [phone] Subjective:     CC: bilateral knee pain f/u  [id number]  [name] is a 65 y.o. female coming in with complaint of bilateral knee pain. Patient was found to have Chronic changes of the knees that is moderate to severe in nature. Patient was given corticosteroid injections 3 weeks ago. Patient states this didn't seem to make improvement until the last 5 days. Started having worsening pain again. Feels that the left knee is somewhat unstable. Especially with going upstairs. Denies any numbness or tingling or any radiation of pain. Feels that the swelling she was having previously is improved.   Previous x-rays were independently visualized by me showing mild to moderate degenerative changes in all 3 compartments of the knees bilaterally.  Past Medical History:  Diagnosis Date  . Acid reflux   . Arthritis   . Asthma   . Hypertension   . Lupus ([place]) 07/13/2011  . 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 Stroke [facility])    lt side weakness   Past Surgical History:  Procedure Laterality Date  . ABDOMINAL HYSTERECTOMY    . BACK SURGERY    . CHOLECYSTECTOMY    . FOOT SURGERY     x2  . MASS EXCISION     back  . rortator     rotator cuff   Social History   Social History  . Marital status: Married    Spouse name: N/A  . Number of children: N/A  . Years of education: N/A   Social History Main Topics  . Smoking status: Former Smoker    Packs/day: 1.00    Years: 10.00    Quit date: 06/17/1994  . Smokeless tobacco: Never Used  . Alcohol use 0.0 oz/week  . Drug use: No  . Sexual activity: Not Currently   Other Topics Concern  . None   Social History Narrative  . None   Allergies  Allergen Reactions  . Ciprofloxacin Itching  . E-Mycin [Erythromycin Base] Nausea And Vomiting  . Oxycodone Other (See Comments)    Hallucination.   No family history  on file. Hx of RA and lupus in family.  Past medical history, social, surgical and family history all reviewed in electronic medical record.  No pertanent information unless stated regarding to the chief complaint.   Review of Systems: No headache, visual changes, nausea, vomiting, diarrhea, constipation, dizziness, abdominal pain, skin rash, fevers, chills, night sweats, weight loss, swollen lymph nodes,  chest pain, shortness of breath, mood changes.   Objective  Blood pressure 128/80, pulse 65, weight 204 lb (92.5 kg), SpO2 98 %.  General: No apparent distress alert and oriented x3 mood and affect normal, dressed appropriately.  HEENT: Pupils equal, extraocular movements intact  Respiratory: Patient's speak in full sentences and does not appear short of breath  Cardiovascular: No lower extremity edema, non tender, no erythema  Skin: Warm dry intact with no signs of infection or rash on extremities or on axial skeleton.  Abdomen: Soft nontender  Neuro: Cranial nerves II through XII are intact, neurovascularly intact in all extremities with 2+ DTRs and 2+ pulses.  Lymph: No lymphadenopathy of posterior or anterior cervical chain or axillae bilaterally.  Gait normal with good balance and coordination.  MSK:  Non tender with full range of motion and good stability and symmetric strength and tone of shoulders, elbows, wrist, hip, and ankles bilaterally.  Knee:Bilateral Mild valgus  deformity bilaterally.  Mild tenderness over the medial and patellofemoral joints bilaterally Mild instability with valgus force.. Negative Mcmurray's, Apley's, and Thessalonian tests.  painful patellar compression  Patellar glide with mild crepitus. Patellar and quadriceps tendons unremarkable. Hamstring and quadriceps strength is normal.       Impression and Recommendations:     This case required medical decision making of moderate complexity.      Note: This dictation was prepared with Dragon  dictation along with smaller phrase technology. Any transcriptional errors that result from this process are unintentional.

## 2016-03-15 ENCOUNTER — Ambulatory Visit (INDEPENDENT_AMBULATORY_CARE_PROVIDER_SITE_OTHER): Payer: Medicare Other | Admitting: Family Medicine

## 2016-03-15 ENCOUNTER — Encounter: Payer: Self-pay | Admitting: Family Medicine

## 2016-03-15 VITALS — BP 128/80 | HR 65 | Wt 204.0 lb

## 2016-03-15 DIAGNOSIS — M170 Bilateral primary osteoarthritis of knee: Secondary | ICD-10-CM

## 2016-03-15 NOTE — Patient Instructions (Signed)
Good to see you  [facility] will be calling you Ice 20 minutes 2 times daily. Usually after activity and before bed. pennsaid pinkie amount topically 2 times daily as needed.  Stay active If worsening pain call me and we can try the orthovisc Otherwise see me again in 6 weeks and we can do steroid injections or orthovisc.

## 2016-03-15 NOTE — Assessment & Plan Note (Signed)
Discussed with patient at great length. We discussed icing regimen and home exercises. Patient elected to try formal physical therapy. We discussed continuing to monitor closely. We discussed continuing the topical anti-inflammatory's, over-the-counter medications, and icing. Patient does have rheumatoid arthritis that could also be contributing as well as patient's history of lupus. We'll come back again in 6 weeks. At that time we can either do a corticosteroid injection or consider viscous supplementation.

## 2016-03-29 DIAGNOSIS — M25562 Pain in left knee: Secondary | ICD-10-CM | POA: Diagnosis not present

## 2016-03-29 DIAGNOSIS — M25561 Pain in right knee: Secondary | ICD-10-CM | POA: Diagnosis not present

## 2016-04-04 DIAGNOSIS — M25562 Pain in left knee: Secondary | ICD-10-CM | POA: Diagnosis not present

## 2016-04-04 DIAGNOSIS — M25561 Pain in right knee: Secondary | ICD-10-CM | POA: Diagnosis not present

## 2016-04-08 DIAGNOSIS — M25561 Pain in right knee: Secondary | ICD-10-CM | POA: Diagnosis not present

## 2016-04-08 DIAGNOSIS — M25562 Pain in left knee: Secondary | ICD-10-CM | POA: Diagnosis not present

## 2016-04-18 DIAGNOSIS — J040 Acute laryngitis: Secondary | ICD-10-CM | POA: Diagnosis not present

## 2016-04-18 DIAGNOSIS — J22 Unspecified acute lower respiratory infection: Secondary | ICD-10-CM | POA: Diagnosis not present

## 2016-04-25 ENCOUNTER — Ambulatory Visit: Payer: Medicare Other | Admitting: Family Medicine

## 2016-04-25 ENCOUNTER — Other Ambulatory Visit: Payer: Self-pay | Admitting: Obstetrics

## 2016-04-25 DIAGNOSIS — Z1231 Encounter for screening mammogram for malignant neoplasm of breast: Secondary | ICD-10-CM

## 2016-04-28 DIAGNOSIS — R0602 Shortness of breath: Secondary | ICD-10-CM | POA: Diagnosis not present

## 2016-04-28 DIAGNOSIS — J4541 Moderate persistent asthma with (acute) exacerbation: Secondary | ICD-10-CM | POA: Diagnosis not present

## 2016-05-03 ENCOUNTER — Ambulatory Visit: Payer: Medicare Other | Admitting: Family Medicine

## 2016-05-09 DIAGNOSIS — M25562 Pain in left knee: Secondary | ICD-10-CM | POA: Diagnosis not present

## 2016-05-09 DIAGNOSIS — M25561 Pain in right knee: Secondary | ICD-10-CM | POA: Diagnosis not present

## 2016-05-10 DIAGNOSIS — R600 Localized edema: Secondary | ICD-10-CM | POA: Diagnosis not present

## 2016-05-10 DIAGNOSIS — I10 Essential (primary) hypertension: Secondary | ICD-10-CM | POA: Diagnosis not present

## 2016-05-10 DIAGNOSIS — E559 Vitamin D deficiency, unspecified: Secondary | ICD-10-CM | POA: Diagnosis not present

## 2016-05-10 DIAGNOSIS — M3501 Sicca syndrome with keratoconjunctivitis: Secondary | ICD-10-CM | POA: Diagnosis not present

## 2016-05-10 DIAGNOSIS — Z1321 Encounter for screening for nutritional disorder: Secondary | ICD-10-CM | POA: Diagnosis not present

## 2016-05-10 DIAGNOSIS — M19042 Primary osteoarthritis, left hand: Secondary | ICD-10-CM | POA: Diagnosis not present

## 2016-05-10 DIAGNOSIS — M064 Inflammatory polyarthropathy: Secondary | ICD-10-CM | POA: Diagnosis not present

## 2016-05-10 DIAGNOSIS — M19041 Primary osteoarthritis, right hand: Secondary | ICD-10-CM | POA: Diagnosis not present

## 2016-05-10 DIAGNOSIS — M7989 Other specified soft tissue disorders: Secondary | ICD-10-CM | POA: Diagnosis not present

## 2016-05-13 DIAGNOSIS — M25561 Pain in right knee: Secondary | ICD-10-CM | POA: Diagnosis not present

## 2016-05-13 DIAGNOSIS — M25562 Pain in left knee: Secondary | ICD-10-CM | POA: Diagnosis not present

## 2016-05-16 DIAGNOSIS — J4541 Moderate persistent asthma with (acute) exacerbation: Secondary | ICD-10-CM | POA: Diagnosis not present

## 2016-05-16 DIAGNOSIS — M25561 Pain in right knee: Secondary | ICD-10-CM | POA: Diagnosis not present

## 2016-05-16 DIAGNOSIS — Z23 Encounter for immunization: Secondary | ICD-10-CM | POA: Diagnosis not present

## 2016-05-16 DIAGNOSIS — M25562 Pain in left knee: Secondary | ICD-10-CM | POA: Diagnosis not present

## 2016-05-17 ENCOUNTER — Ambulatory Visit
Admission: RE | Admit: 2016-05-17 | Discharge: 2016-05-17 | Disposition: A | Discharge status: Home / Self Care | Payer: Medicare Other | Source: Ambulatory Visit | Attending: Obstetrics | Admitting: Obstetrics

## 2016-05-17 DIAGNOSIS — Z1231 Encounter for screening mammogram for malignant neoplasm of breast: Secondary | ICD-10-CM | POA: Diagnosis not present

## 2016-05-18 DIAGNOSIS — M25562 Pain in left knee: Secondary | ICD-10-CM | POA: Diagnosis not present

## 2016-05-18 DIAGNOSIS — M25561 Pain in right knee: Secondary | ICD-10-CM | POA: Diagnosis not present

## 2016-05-23 DIAGNOSIS — M25561 Pain in right knee: Secondary | ICD-10-CM | POA: Diagnosis not present

## 2016-05-23 DIAGNOSIS — M25562 Pain in left knee: Secondary | ICD-10-CM | POA: Diagnosis not present

## 2016-05-25 DIAGNOSIS — M25562 Pain in left knee: Secondary | ICD-10-CM | POA: Diagnosis not present

## 2016-05-25 DIAGNOSIS — M25561 Pain in right knee: Secondary | ICD-10-CM | POA: Diagnosis not present

## 2016-05-30 DIAGNOSIS — M25561 Pain in right knee: Secondary | ICD-10-CM | POA: Diagnosis not present

## 2016-05-30 DIAGNOSIS — M25562 Pain in left knee: Secondary | ICD-10-CM | POA: Diagnosis not present

## 2016-06-06 DIAGNOSIS — Z0000 Encounter for general adult medical examination without abnormal findings: Secondary | ICD-10-CM | POA: Diagnosis not present

## 2016-07-11 DIAGNOSIS — Z79899 Other long term (current) drug therapy: Secondary | ICD-10-CM | POA: Diagnosis not present

## 2016-07-11 DIAGNOSIS — M3501 Sicca syndrome with keratoconjunctivitis: Secondary | ICD-10-CM | POA: Diagnosis not present

## 2016-07-11 DIAGNOSIS — M064 Inflammatory polyarthropathy: Secondary | ICD-10-CM | POA: Diagnosis not present

## 2016-07-11 DIAGNOSIS — M150 Primary generalized (osteo)arthritis: Secondary | ICD-10-CM | POA: Diagnosis not present

## 2016-07-12 DIAGNOSIS — J4520 Mild intermittent asthma, uncomplicated: Secondary | ICD-10-CM | POA: Diagnosis not present

## 2016-07-12 DIAGNOSIS — D696 Thrombocytopenia, unspecified: Secondary | ICD-10-CM | POA: Diagnosis not present

## 2016-07-12 DIAGNOSIS — E876 Hypokalemia: Secondary | ICD-10-CM | POA: Diagnosis not present

## 2016-07-12 DIAGNOSIS — I10 Essential (primary) hypertension: Secondary | ICD-10-CM | POA: Diagnosis not present

## 2016-07-12 DIAGNOSIS — R600 Localized edema: Secondary | ICD-10-CM | POA: Diagnosis not present

## 2016-07-12 DIAGNOSIS — C8300 Small cell B-cell lymphoma, unspecified site: Secondary | ICD-10-CM | POA: Diagnosis not present

## 2016-07-19 DIAGNOSIS — H40013 Open angle with borderline findings, low risk, bilateral: Secondary | ICD-10-CM | POA: Diagnosis not present

## 2016-08-03 DIAGNOSIS — R42 Dizziness and giddiness: Secondary | ICD-10-CM | POA: Diagnosis not present

## 2016-08-03 DIAGNOSIS — K529 Noninfective gastroenteritis and colitis, unspecified: Secondary | ICD-10-CM | POA: Diagnosis not present

## 2016-08-23 DIAGNOSIS — M3501 Sicca syndrome with keratoconjunctivitis: Secondary | ICD-10-CM | POA: Diagnosis not present

## 2016-08-23 DIAGNOSIS — M064 Inflammatory polyarthropathy: Secondary | ICD-10-CM | POA: Diagnosis not present

## 2016-08-23 DIAGNOSIS — Z79899 Other long term (current) drug therapy: Secondary | ICD-10-CM | POA: Diagnosis not present

## 2016-08-23 DIAGNOSIS — M150 Primary generalized (osteo)arthritis: Secondary | ICD-10-CM | POA: Diagnosis not present

## 2016-09-13 DIAGNOSIS — B301 Conjunctivitis due to adenovirus: Secondary | ICD-10-CM | POA: Diagnosis not present

## 2016-09-13 DIAGNOSIS — J069 Acute upper respiratory infection, unspecified: Secondary | ICD-10-CM | POA: Diagnosis not present

## 2016-09-29 DIAGNOSIS — J3089 Other allergic rhinitis: Secondary | ICD-10-CM | POA: Diagnosis not present

## 2016-09-29 DIAGNOSIS — H1045 Other chronic allergic conjunctivitis: Secondary | ICD-10-CM | POA: Diagnosis not present

## 2016-09-29 DIAGNOSIS — J4530 Mild persistent asthma, uncomplicated: Secondary | ICD-10-CM | POA: Diagnosis not present

## 2016-09-29 DIAGNOSIS — J301 Allergic rhinitis due to pollen: Secondary | ICD-10-CM | POA: Diagnosis not present

## 2016-09-29 DIAGNOSIS — J019 Acute sinusitis, unspecified: Secondary | ICD-10-CM | POA: Diagnosis not present

## 2016-10-19 DIAGNOSIS — N952 Postmenopausal atrophic vaginitis: Secondary | ICD-10-CM | POA: Diagnosis not present

## 2016-10-20 DIAGNOSIS — H1031 Unspecified acute conjunctivitis, right eye: Secondary | ICD-10-CM | POA: Diagnosis not present

## 2016-12-01 ENCOUNTER — Other Ambulatory Visit: Payer: Self-pay | Admitting: Internal Medicine

## 2016-12-01 ENCOUNTER — Ambulatory Visit
Admission: RE | Admit: 2016-12-01 | Discharge: 2016-12-01 | Disposition: A | Discharge status: Home / Self Care | Payer: Medicare Other | Source: Ambulatory Visit | Attending: Internal Medicine | Admitting: Internal Medicine

## 2016-12-01 DIAGNOSIS — R1915 Other abnormal bowel sounds: Secondary | ICD-10-CM

## 2016-12-01 DIAGNOSIS — R1031 Right lower quadrant pain: Secondary | ICD-10-CM

## 2016-12-01 DIAGNOSIS — R197 Diarrhea, unspecified: Secondary | ICD-10-CM | POA: Diagnosis not present

## 2016-12-01 DIAGNOSIS — R109 Unspecified abdominal pain: Secondary | ICD-10-CM | POA: Diagnosis not present

## 2016-12-01 DIAGNOSIS — R110 Nausea: Secondary | ICD-10-CM | POA: Diagnosis not present

## 2016-12-02 DIAGNOSIS — R1032 Left lower quadrant pain: Secondary | ICD-10-CM | POA: Diagnosis not present

## 2016-12-02 DIAGNOSIS — Z1211 Encounter for screening for malignant neoplasm of colon: Secondary | ICD-10-CM | POA: Diagnosis not present

## 2016-12-02 DIAGNOSIS — R933 Abnormal findings on diagnostic imaging of other parts of digestive tract: Secondary | ICD-10-CM | POA: Diagnosis not present

## 2016-12-02 DIAGNOSIS — K5730 Diverticulosis of large intestine without perforation or abscess without bleeding: Secondary | ICD-10-CM | POA: Diagnosis not present

## 2016-12-02 DIAGNOSIS — R110 Nausea: Secondary | ICD-10-CM | POA: Diagnosis not present

## 2016-12-05 DIAGNOSIS — R933 Abnormal findings on diagnostic imaging of other parts of digestive tract: Secondary | ICD-10-CM | POA: Diagnosis not present

## 2016-12-05 DIAGNOSIS — K5730 Diverticulosis of large intestine without perforation or abscess without bleeding: Secondary | ICD-10-CM | POA: Diagnosis not present

## 2016-12-05 DIAGNOSIS — Z1211 Encounter for screening for malignant neoplasm of colon: Secondary | ICD-10-CM | POA: Diagnosis not present

## 2017-01-16 DIAGNOSIS — Z79899 Other long term (current) drug therapy: Secondary | ICD-10-CM | POA: Diagnosis not present

## 2017-01-16 DIAGNOSIS — H2513 Age-related nuclear cataract, bilateral: Secondary | ICD-10-CM | POA: Diagnosis not present

## 2017-01-16 DIAGNOSIS — H25013 Cortical age-related cataract, bilateral: Secondary | ICD-10-CM | POA: Diagnosis not present

## 2017-01-16 DIAGNOSIS — H40013 Open angle with borderline findings, low risk, bilateral: Secondary | ICD-10-CM | POA: Diagnosis not present

## 2017-02-07 DIAGNOSIS — I10 Essential (primary) hypertension: Secondary | ICD-10-CM | POA: Diagnosis not present

## 2017-02-07 DIAGNOSIS — D696 Thrombocytopenia, unspecified: Secondary | ICD-10-CM | POA: Diagnosis not present

## 2017-02-07 DIAGNOSIS — E876 Hypokalemia: Secondary | ICD-10-CM | POA: Diagnosis not present

## 2017-02-14 DIAGNOSIS — M150 Primary generalized (osteo)arthritis: Secondary | ICD-10-CM | POA: Diagnosis not present

## 2017-02-14 DIAGNOSIS — D696 Thrombocytopenia, unspecified: Secondary | ICD-10-CM | POA: Diagnosis not present

## 2017-02-14 DIAGNOSIS — M3501 Sicca syndrome with keratoconjunctivitis: Secondary | ICD-10-CM | POA: Diagnosis not present

## 2017-02-14 DIAGNOSIS — J4520 Mild intermittent asthma, uncomplicated: Secondary | ICD-10-CM | POA: Diagnosis not present

## 2017-02-14 DIAGNOSIS — Z0000 Encounter for general adult medical examination without abnormal findings: Secondary | ICD-10-CM | POA: Diagnosis not present

## 2017-03-21 DIAGNOSIS — M3501 Sicca syndrome with keratoconjunctivitis: Secondary | ICD-10-CM | POA: Diagnosis not present

## 2017-03-21 DIAGNOSIS — M150 Primary generalized (osteo)arthritis: Secondary | ICD-10-CM | POA: Diagnosis not present

## 2017-03-21 DIAGNOSIS — Z79899 Other long term (current) drug therapy: Secondary | ICD-10-CM | POA: Diagnosis not present

## 2017-03-21 DIAGNOSIS — Z23 Encounter for immunization: Secondary | ICD-10-CM | POA: Diagnosis not present

## 2017-03-21 DIAGNOSIS — M064 Inflammatory polyarthropathy: Secondary | ICD-10-CM | POA: Diagnosis not present

## 2017-05-30 DIAGNOSIS — M545 Low back pain: Secondary | ICD-10-CM | POA: Diagnosis not present

## 2017-06-02 DIAGNOSIS — I10 Essential (primary) hypertension: Secondary | ICD-10-CM | POA: Diagnosis present

## 2017-06-02 DIAGNOSIS — Z01419 Encounter for gynecological examination (general) (routine) without abnormal findings: Secondary | ICD-10-CM | POA: Diagnosis not present

## 2017-06-05 DIAGNOSIS — M545 Low back pain: Secondary | ICD-10-CM | POA: Diagnosis not present

## 2017-06-09 DIAGNOSIS — M545 Low back pain: Secondary | ICD-10-CM | POA: Diagnosis not present

## 2017-06-16 DIAGNOSIS — M47816 Spondylosis without myelopathy or radiculopathy, lumbar region: Secondary | ICD-10-CM | POA: Diagnosis not present

## 2017-07-04 DIAGNOSIS — I5030 Unspecified diastolic (congestive) heart failure: Secondary | ICD-10-CM

## 2017-07-04 HISTORY — DX: Unspecified diastolic (congestive) heart failure: I50.30

## 2017-07-12 DIAGNOSIS — M47816 Spondylosis without myelopathy or radiculopathy, lumbar region: Secondary | ICD-10-CM | POA: Diagnosis not present

## 2017-08-01 DIAGNOSIS — M79643 Pain in unspecified hand: Secondary | ICD-10-CM | POA: Diagnosis not present

## 2017-08-01 DIAGNOSIS — M549 Dorsalgia, unspecified: Secondary | ICD-10-CM | POA: Diagnosis not present

## 2017-08-01 DIAGNOSIS — Z79899 Other long term (current) drug therapy: Secondary | ICD-10-CM | POA: Diagnosis not present

## 2017-08-01 DIAGNOSIS — M3501 Sicca syndrome with keratoconjunctivitis: Secondary | ICD-10-CM | POA: Diagnosis not present

## 2017-08-01 DIAGNOSIS — M150 Primary generalized (osteo)arthritis: Secondary | ICD-10-CM | POA: Diagnosis not present

## 2017-08-01 DIAGNOSIS — M064 Inflammatory polyarthropathy: Secondary | ICD-10-CM | POA: Diagnosis not present

## 2017-08-07 ENCOUNTER — Other Ambulatory Visit: Payer: Self-pay | Admitting: Obstetrics

## 2017-08-07 DIAGNOSIS — Z1231 Encounter for screening mammogram for malignant neoplasm of breast: Secondary | ICD-10-CM

## 2017-08-08 DIAGNOSIS — M47816 Spondylosis without myelopathy or radiculopathy, lumbar region: Secondary | ICD-10-CM | POA: Diagnosis not present

## 2017-08-09 ENCOUNTER — Ambulatory Visit
Admission: RE | Admit: 2017-08-09 | Discharge: 2017-08-09 | Disposition: A | Discharge status: Home / Self Care | Payer: Medicare Other | Source: Ambulatory Visit | Attending: Obstetrics | Admitting: Obstetrics

## 2017-08-09 DIAGNOSIS — Z1231 Encounter for screening mammogram for malignant neoplasm of breast: Secondary | ICD-10-CM | POA: Diagnosis not present

## 2017-08-24 DIAGNOSIS — J019 Acute sinusitis, unspecified: Secondary | ICD-10-CM | POA: Diagnosis not present

## 2017-08-24 DIAGNOSIS — J45909 Unspecified asthma, uncomplicated: Secondary | ICD-10-CM | POA: Diagnosis not present

## 2017-09-05 DIAGNOSIS — N390 Urinary tract infection, site not specified: Secondary | ICD-10-CM | POA: Diagnosis not present

## 2017-09-05 DIAGNOSIS — I10 Essential (primary) hypertension: Secondary | ICD-10-CM | POA: Diagnosis not present

## 2017-09-05 DIAGNOSIS — Z0000 Encounter for general adult medical examination without abnormal findings: Secondary | ICD-10-CM | POA: Diagnosis not present

## 2017-09-05 DIAGNOSIS — M150 Primary generalized (osteo)arthritis: Secondary | ICD-10-CM | POA: Diagnosis not present

## 2017-09-05 DIAGNOSIS — Z79899 Other long term (current) drug therapy: Secondary | ICD-10-CM | POA: Diagnosis not present

## 2017-09-05 DIAGNOSIS — D696 Thrombocytopenia, unspecified: Secondary | ICD-10-CM | POA: Diagnosis not present

## 2017-09-12 DIAGNOSIS — I10 Essential (primary) hypertension: Secondary | ICD-10-CM | POA: Diagnosis not present

## 2017-09-12 DIAGNOSIS — M3501 Sicca syndrome with keratoconjunctivitis: Secondary | ICD-10-CM | POA: Diagnosis not present

## 2017-09-12 DIAGNOSIS — M5136 Other intervertebral disc degeneration, lumbar region: Secondary | ICD-10-CM | POA: Diagnosis not present

## 2017-09-12 DIAGNOSIS — M150 Primary generalized (osteo)arthritis: Secondary | ICD-10-CM | POA: Diagnosis not present

## 2017-09-12 DIAGNOSIS — J4520 Mild intermittent asthma, uncomplicated: Secondary | ICD-10-CM | POA: Diagnosis not present

## 2017-09-12 DIAGNOSIS — C8300 Small cell B-cell lymphoma, unspecified site: Secondary | ICD-10-CM | POA: Diagnosis not present

## 2017-09-12 DIAGNOSIS — D696 Thrombocytopenia, unspecified: Secondary | ICD-10-CM | POA: Diagnosis not present

## 2017-09-12 DIAGNOSIS — Z23 Encounter for immunization: Secondary | ICD-10-CM | POA: Diagnosis not present

## 2017-09-12 DIAGNOSIS — E876 Hypokalemia: Secondary | ICD-10-CM | POA: Diagnosis not present

## 2017-09-13 DIAGNOSIS — H40013 Open angle with borderline findings, low risk, bilateral: Secondary | ICD-10-CM | POA: Diagnosis not present

## 2017-09-29 ENCOUNTER — Ambulatory Visit: Payer: Self-pay

## 2017-09-29 NOTE — Telephone Encounter (Signed)
Patient called wanting to make an appointment with Dr. Tamala Julian to check her knees out. She says "I saw him before and I want to know if there is something wrong. My knees, especially the right one, has given out causing me to fall a couple of times. The last fall was last week in the [place] parking lot. I was getting ready to get in my car, my knee gave out and I fell. I didn't hurt myself, just sore all over. It happened the first few times last November. I was getting up from the sofa, I felt a sharp pain to my right knee and I fell back on the sofa. I tried to get up again and the knee gave out, no pain, and I feel back on the sofa. Another time I was walking in the [place] of my house and the knee gave out and I leaned up against the wall. I feel getting in the shower once and my husband had to help me up." I asked is she having pain in her knees now or anymore than that one time, she says "no pain at all." I advised the earliest appointment to be made because she would need a 30 minute appointment is mid April. She agreed to the appointment on Wednesday, April 17th at 1100. Care advice given, she verbalized understanding.  Reason for Disposition . Nursing judgment or information in reference  Answer Assessment - Initial Assessment Questions 1. REASON FOR CALL: "What is your main concern right now?"     Knees giving out 2. ONSET: "When did the ___ start?"     November, 2018 3. SEVERITY: "How bad is the ___?"     N/A 4. FEVER: "Do you have a fever?"     No 5. OTHER SYMPTOMS: "Do you have any other new symptoms?"     No 6. INTERVENTIONS AND RESPONSE: "What have you done so far to try to make this better? What medications have you used?"     Nothing can be done 7. PREGNANCY: "Is there any chance you are pregnant?"     No  Protocols used: NO GUIDELINE AVAILABLE-A-AH

## 2017-10-18 ENCOUNTER — Encounter: Payer: Self-pay | Admitting: Family Medicine

## 2017-10-18 ENCOUNTER — Ambulatory Visit (INDEPENDENT_AMBULATORY_CARE_PROVIDER_SITE_OTHER): Payer: Medicare HMO | Admitting: Family Medicine

## 2017-10-18 DIAGNOSIS — M170 Bilateral primary osteoarthritis of knee: Secondary | ICD-10-CM | POA: Diagnosis not present

## 2017-10-18 NOTE — Patient Instructions (Signed)
Good to see yo u Ice 20 minutes 2 times daily. Usually after activity and before bed. pennsaid pinkie amount topically 2 times daily as needed.  Stay active.  Can get better braces.  See me again in 4 weeks and if not better then we will consider other injecitons

## 2017-10-18 NOTE — Progress Notes (Signed)
[name], [place] [address] Phone: [phone] Subjective:    I'm seeing this patient by the request  of:    CC: Bilateral knee pain  [id number]  [name] is a 67 y.o. female coming in with complaint of left knee pain. She has been having an issue with the left knee giving out on her. She fell in March getting into her vehicle. Patient has pain with sitting to stand and pain is over the patella tendon. Patient uses muscle rub when the knees ache at night.    Patient was last seen greater than a year ago and did respond very well to the injections.  Patient also did physical therapy. Worsening symptoms and having some increasing instability.     Past Medical History:  Diagnosis Date  . Acid reflux   . Arthritis   . Asthma   . Hypertension   . Lupus ([place]) 07/13/2011  . 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 Stroke [facility])    lt side weakness   Past Surgical History:  Procedure Laterality Date  . ABDOMINAL HYSTERECTOMY    . BACK SURGERY    . CHOLECYSTECTOMY    . FOOT SURGERY     x2  . MASS EXCISION     back  . rortator     rotator cuff   Social History   Socioeconomic History  . Marital status: Married    Spouse name: Not on file  . Number of children: Not on file  . Years of education: Not on file  . Highest education level: Not on file  Occupational History  . Not on file  Social Needs  . Financial resource strain: Not on file  . Food insecurity:    Worry: Not on file    Inability: Not on file  . Transportation needs:    Medical: Not on file    Non-medical: Not on file  Tobacco Use  . Smoking status: Former Smoker    Packs/day: 1.00    Years: 10.00    Pack years: 10.00    Last attempt to quit: 06/17/1994    Years since quitting: 23.3  . Smokeless tobacco: Never Used  Substance and Sexual Activity  . Alcohol use: Yes    Alcohol/week: 0.0 oz  . Drug use: No  . Sexual  activity: Not Currently  Lifestyle  . Physical activity:    Days per week: Not on file    Minutes per session: Not on file  . Stress: Not on file  Relationships  . Social connections:    Talks on phone: Not on file    Gets together: Not on file    Attends religious service: Not on file    Active member of club or organization: Not on file    Attends meetings of clubs or organizations: Not on file    Relationship status: Not on file  Other Topics Concern  . Not on file  Social History Narrative  . Not on file   Allergies  Allergen Reactions  . Ciprofloxacin Itching  . E-Mycin [Erythromycin Base] Nausea And Vomiting  . Oxycodone Other (See Comments)    Hallucination.   No family history on file.  No hip family history of autoimmune   Past medical history, social, surgical and family history all reviewed in electronic medical record.  No pertanent information unless stated regarding to the chief complaint.   Review of Systems:Review of systems updated and  as accurate as of 10/18/17  No headache, visual changes, nausea, vomiting, diarrhea, constipation, dizziness, abdominal pain, skin rash, fevers, chills, night sweats, weight loss, swollen lymph nodes, body aches,  chest pain, shortness of breath, mood changes.  Positive muscle aches and joint swelling  Objective  Blood pressure 140/90, height 5\' 5"  (1.651 m), weight 211 lb (95.7 kg). Systems examined below as of 10/18/17   General: No apparent distress alert and oriented x3 mood and affect normal, dressed appropriately.  HEENT: Pupils equal, extraocular movements intact  Respiratory: Patient's speak in full sentences and does not appear short of breath  Cardiovascular: No lower extremity edema, non tender, no erythema  Skin: Warm dry intact with no signs of infection or rash on extremities or on axial skeleton.  Abdomen: Soft nontender  Neuro: Cranial nerves II through XII are intact, neurovascularly intact in all  extremities with 2+ DTRs and 2+ pulses.  Lymph: No lymphadenopathy of posterior or anterior cervical chain or axillae bilaterally.  Gait mild antalgic gait walking with the aid of a cane MSK: Mild tender with full range of motion and good stability and symmetric strength and tone of shoulders, elbows, wrist, hip, and ankles bilaterally.  Knee: Bilateral valgus deformity noted. Large thigh to calf ratio.  Tender to palpation over medial and PF joint line.  ROM full in flexion and extension and lower leg rotation. instability with valgus force.  painful patellar compression. Patellar glide with moderate crepitus. Patellar and quadriceps tendons unremarkable. Hamstring and quadriceps strength is normal.  After informed written and verbal consent, patient was seated on exam table. Right knee was prepped with alcohol swab and utilizing anterolateral approach, patient's right knee space was injected with 4:1  marcaine 0.5%: Kenalog 40mg /dL. Patient tolerated the procedure well without immediate complications.  After informed written and verbal consent, patient was seated on exam table. Left knee was prepped with alcohol swab and utilizing anterolateral approach, patient's left knee space was injected with 4:1  marcaine 0.5%: Kenalog 40mg /dL. Patient tolerated the procedure well without immediate complications.   Impression and Recommendations:     This case required medical decision making of moderate complexity.      Note: This dictation was prepared with Dragon dictation along with smaller phrase technology. Any transcriptional errors that result from this process are unintentional.

## 2017-10-18 NOTE — Assessment & Plan Note (Signed)
Bilateral injection given.  Tolerated the procedure well.  Patient did do well with this but would be a candidate for Visco supplementation in case.  Encourage her to try topical anti-inflammatories and icing regimen.  Patient does have past medical history significant for lymphoma as well as lupus which makes it somewhat difficult.  We will see how patient responds to conservative therapy.  Patient declined a custom brace even though there is instability and patient has an abnormal thigh to calf ratio.  Follow-up again in 4 weeks

## 2017-11-09 DIAGNOSIS — K5730 Diverticulosis of large intestine without perforation or abscess without bleeding: Secondary | ICD-10-CM | POA: Diagnosis not present

## 2017-11-09 DIAGNOSIS — R197 Diarrhea, unspecified: Secondary | ICD-10-CM | POA: Diagnosis not present

## 2017-11-09 DIAGNOSIS — R194 Change in bowel habit: Secondary | ICD-10-CM | POA: Diagnosis not present

## 2017-11-09 DIAGNOSIS — R140 Abdominal distension (gaseous): Secondary | ICD-10-CM | POA: Diagnosis not present

## 2017-11-09 DIAGNOSIS — E669 Obesity, unspecified: Secondary | ICD-10-CM | POA: Diagnosis not present

## 2017-11-16 NOTE — Progress Notes (Signed)
[name] [address] [place], [place] [address] Phone: [phone] Subjective:    I'm seeing this patient by the request  of:    CC: Bilateral knee pain  [id number]  [name] is a 67 y.o. female coming in with complaint of bilateral knee pain.  Patient was found to have severe arthritis of the knees.  Given injections 1 month ago.  States that she is doing 60% better.  No significant discomfort at this time.  Able to do all daily activities.  Does not want to do any more aggressive therapies at this time.     Past Medical History:  Diagnosis Date  . Acid reflux   . Arthritis   . Asthma   . Hypertension   . Lupus ([place]) 07/13/2011  . 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 Stroke [facility])    lt side weakness   Past Surgical History:  Procedure Laterality Date  . ABDOMINAL HYSTERECTOMY    . BACK SURGERY    . CHOLECYSTECTOMY    . FOOT SURGERY     x2  . MASS EXCISION     back  . rortator     rotator cuff   Social History   Socioeconomic History  . Marital status: Married    Spouse name: Not on file  . Number of children: Not on file  . Years of education: Not on file  . Highest education level: Not on file  Occupational History  . Not on file  Social Needs  . Financial resource strain: Not on file  . Food insecurity:    Worry: Not on file    Inability: Not on file  . Transportation needs:    Medical: Not on file    Non-medical: Not on file  Tobacco Use  . Smoking status: Former Smoker    Packs/day: 1.00    Years: 10.00    Pack years: 10.00    Last attempt to quit: 06/17/1994    Years since quitting: 23.4  . Smokeless tobacco: Never Used  Substance and Sexual Activity  . Alcohol use: Yes    Alcohol/week: 0.0 oz  . Drug use: No  . Sexual activity: Not Currently  Lifestyle  . Physical activity:    Days per week: Not on file    Minutes per session: Not on file  . Stress: Not on file    Relationships  . Social connections:    Talks on phone: Not on file    Gets together: Not on file    Attends religious service: Not on file    Active member of club or organization: Not on file    Attends meetings of clubs or organizations: Not on file    Relationship status: Not on file  Other Topics Concern  . Not on file  Social History Narrative  . Not on file   Allergies  Allergen Reactions  . Ciprofloxacin Itching  . E-Mycin [Erythromycin Base] Nausea And Vomiting  . Oxycodone Other (See Comments)    Hallucination.   No family history on file.   Past medical history, social, surgical and family history all reviewed in electronic medical record.  No pertanent information unless stated regarding to the chief complaint.   Review of Systems:Review of systems updated and as accurate as of 11/17/17  No headache, visual changes, nausea, vomiting, diarrhea, constipation, dizziness, abdominal pain, skin rash, fevers, chills, night sweats, weight loss, swollen lymph nodes, body aches,  chest pain,  shortness of breath, mood changes.  Mild positive muscle aches and joint swelling  Objective  Blood pressure 130/84, pulse 69, height 5\' 5"  (1.651 m), weight 207 lb (93.9 kg), SpO2 100 %. Systems examined below as of 11/17/17   General: No apparent distress alert and oriented x3 mood and affect normal, dressed appropriately.  HEENT: Pupils equal, extraocular movements intact  Respiratory: Patient's speak in full sentences and does not appear short of breath  Cardiovascular: No lower extremity edema, non tender, no erythema  Skin: Warm dry intact with no signs of infection or rash on extremities or on axial skeleton.  Abdomen: Soft nontender  Neuro: Cranial nerves II through XII are intact, neurovascularly intact in all extremities with 2+ DTRs and 2+ pulses.  Lymph: No lymphadenopathy of posterior or anterior cervical chain or axillae bilaterally.  Gait normal with good balance and  coordination.  MSK:  Non tender with full range of motion and good stability and symmetric strength and tone of shoulders, elbows, wrist, hip, and ankles bilaterally.  Knee: Bilateral valgus deformity noted.  Abnormal thigh to calf ratio.  Tender to palpation over medial and PF joint line.  Improved from previous exam ROM full in flexion and extension and lower leg rotation. instability with valgus force.  painful patellar compression. Patellar glide with moderate crepitus. Patellar and quadriceps tendons unremarkable. Hamstring and quadriceps strength is normal.     Impression and Recommendations:     This case required medical decision making of moderate complexity.      Note: This dictation was prepared with Dragon dictation along with smaller phrase technology. Any transcriptional errors that result from this process are unintentional.

## 2017-11-17 ENCOUNTER — Ambulatory Visit (INDEPENDENT_AMBULATORY_CARE_PROVIDER_SITE_OTHER): Payer: Medicare HMO | Admitting: Family Medicine

## 2017-11-17 ENCOUNTER — Encounter: Payer: Self-pay | Admitting: Family Medicine

## 2017-11-17 DIAGNOSIS — M170 Bilateral primary osteoarthritis of knee: Secondary | ICD-10-CM

## 2017-11-17 NOTE — Patient Instructions (Signed)
Good to see you  Iron 65mg  with 500mg  of vitamin C may help the leg pain  pennsaid pinkie amount topically 2 times daily as needed.   Keep it up  See me again in 6 weeks and can do either steroid or the other injections if needed

## 2017-11-17 NOTE — Assessment & Plan Note (Signed)
Doing well.  Continue conservative therapy.  Follow-up again in 6 weeks.  Could be candidate for over Visco supplementation.

## 2017-12-22 DIAGNOSIS — R300 Dysuria: Secondary | ICD-10-CM | POA: Diagnosis not present

## 2017-12-22 DIAGNOSIS — R8290 Unspecified abnormal findings in urine: Secondary | ICD-10-CM | POA: Diagnosis not present

## 2017-12-29 NOTE — Progress Notes (Signed)
[name] [place] [place], [place] [address] Phone: [phone] Subjective:     CC: Bilateral knee pain  TDS:[id number]  [name] is a 67 y.o. female coming in with complaint of bilateral knee pain.  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  Last time seen Nov 17, 2017.  Was given injections of greater than 10 weeks ago.  Recently had a fall.  Felt like she had increasing instability of the legs again.  Sometimes feels like she does not know where the knees are in space.  Has had a history of lymphoma as well as history of back surgery.  Patient states that the numbness is not continuous though.  Patient though states that sometimes though she has no feelings in her legs.     Past Medical History:  Diagnosis Date  . Acid reflux   . Arthritis   . Asthma   . Hypertension   . Lupus ([place]) 07/13/2011  . 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 Stroke [facility])    lt side weakness   Past Surgical History:  Procedure Laterality Date  . ABDOMINAL HYSTERECTOMY    . BACK SURGERY    . CHOLECYSTECTOMY    . FOOT SURGERY     x2  . MASS EXCISION     back  . rortator     rotator cuff   Social History   Socioeconomic History  . Marital status: Married    Spouse name: Not on file  . Number of children: Not on file  . Years of education: Not on file  . Highest education level: Not on file  Occupational History  . Not on file  Social Needs  . Financial resource strain: Not on file  . Food insecurity:    Worry: Not on file    Inability: Not on file  . Transportation needs:    Medical: Not on file    Non-medical: Not on file  Tobacco Use  . Smoking status: Former Smoker    Packs/day: 1.00    Years: 10.00    Pack years: 10.00    Last attempt to quit: 06/17/1994    Years since quitting: 23.5  . Smokeless tobacco: Never Used  Substance and Sexual Activity  . Alcohol use: Yes    Alcohol/week: 0.0 oz  . Drug use: No   . Sexual activity: Not Currently  Lifestyle  . Physical activity:    Days per week: Not on file    Minutes per session: Not on file  . Stress: Not on file  Relationships  . Social connections:    Talks on phone: Not on file    Gets together: Not on file    Attends religious service: Not on file    Active member of club or organization: Not on file    Attends meetings of clubs or organizations: Not on file    Relationship status: Not on file  Other Topics Concern  . Not on file  Social History Narrative  . Not on file   Allergies  Allergen Reactions  . Ciprofloxacin Itching  . E-Mycin [Erythromycin Base] Nausea And Vomiting  . Oxycodone Other (See Comments)    Hallucination.   No family history on file.  No family history of autoimmune but patient does have lupus   Past medical history, social, surgical and family history all reviewed in electronic medical record.  No pertanent information unless stated regarding to the chief complaint.  Review of Systems:Review of systems updated and as accurate as of 01/01/18  No headache, visual changes, nausea, vomiting, diarrhea, constipation, dizziness, abdominal pain, skin rash, fevers, chills, night sweats, weight loss, swollen lymph nodes, body aches, joint swelling, chest pain, shortness of breath, mood changes.  Positive muscle aches mild to moderate joint swelling  Objective  Blood pressure (!) 150/92, pulse 74, height 5\' 5"  (1.651 m), weight 211 lb (95.7 kg), SpO2 99 %. Systems examined below as of 01/01/18   General: No apparent distress alert and oriented x3 mood and affect normal, dressed appropriately.  HEENT: Pupils equal, extraocular movements intact  Respiratory: Patient's speak in full sentences and does not appear short of breath  Cardiovascular: No lower extremity edema, non tender, no erythema  Skin: Warm dry intact with no signs of infection or rash on extremities or on axial skeleton.  Abdomen: Soft nontender    Neuro: Cranial nerves II through XII are intact, neurovascularly intact in all extremities with 2+ DTRs and 2+ pulses.  Lymph: No lymphadenopathy of posterior or anterior cervical chain or axillae bilaterally.  Gait antalgic walking with the aid of a cane MSK:  tender with near full range of motion and good stability and symmetric strength and tone of shoulders, elbows, wrist, hip, and ankles bilaterally.  Arthritic changes of multiple joints Back Exam:  Inspection: Loss of lordosis Motion: Flexion 45 deg, Extension 45 deg, Side Bending to 45 deg bilaterally,  Rotation to 45 deg bilaterally  SLR laying: Negative  XSLR laying: Negative  Palpable tenderness: Tender to palpation in the paraspinal musculature lumbar spine right greater than left. FABER: negative. Sensory change: Gross sensation intact to all lumbar and sacral dermatomes.  Reflexes: 2+ at both patellar tendons, 2+ at achilles tendons, Babinski's downgoing.  Strength at foot  Plantar-flexion: 5/5 Dorsi-flexion: 5/5 Eversion: 5/5 Inversion: 5/5  Leg strength  Quad: 5/5 Hamstring: 5/5 Hip flexor: 5/5 Hip abductors: 5/5  Gait unremarkable.  Knee: Bilateral valgus deformity noted.  Abnormal thigh to calf ratio.  Tender to palpation over medial and PF joint line.  ROM full in flexion and extension and lower leg rotation. instability with valgus force.  painful patellar compression. Patellar glide with moderate crepitus. Patellar and quadriceps tendons unremarkable. Hamstring and quadriceps strength is normal.    Impression and Recommendations:     This case required medical decision making of moderate complexity.      Note: This dictation was prepared with Dragon dictation along with smaller phrase technology. Any transcriptional errors that result from this process are unintentional.

## 2018-01-01 ENCOUNTER — Encounter: Payer: Self-pay | Admitting: Neurology

## 2018-01-01 ENCOUNTER — Encounter: Payer: Self-pay | Admitting: Family Medicine

## 2018-01-01 ENCOUNTER — Other Ambulatory Visit: Payer: Self-pay | Admitting: *Deleted

## 2018-01-01 ENCOUNTER — Ambulatory Visit (INDEPENDENT_AMBULATORY_CARE_PROVIDER_SITE_OTHER): Payer: Medicare HMO | Admitting: Neurology

## 2018-01-01 ENCOUNTER — Ambulatory Visit (INDEPENDENT_AMBULATORY_CARE_PROVIDER_SITE_OTHER): Payer: Medicare HMO | Admitting: Family Medicine

## 2018-01-01 VITALS — BP 150/92 | HR 74 | Ht 65.0 in | Wt 211.0 lb

## 2018-01-01 DIAGNOSIS — M79605 Pain in left leg: Secondary | ICD-10-CM | POA: Diagnosis not present

## 2018-01-01 DIAGNOSIS — M79604 Pain in right leg: Secondary | ICD-10-CM | POA: Diagnosis not present

## 2018-01-01 DIAGNOSIS — M25569 Pain in unspecified knee: Secondary | ICD-10-CM | POA: Diagnosis not present

## 2018-01-01 DIAGNOSIS — G8929 Other chronic pain: Secondary | ICD-10-CM

## 2018-01-01 DIAGNOSIS — M170 Bilateral primary osteoarthritis of knee: Secondary | ICD-10-CM | POA: Diagnosis not present

## 2018-01-01 DIAGNOSIS — M5410 Radiculopathy, site unspecified: Secondary | ICD-10-CM

## 2018-01-01 NOTE — Procedures (Signed)
[facility]  [place], [address]  [place], [place] [address] Tel: [phone] Fax:  [phone] Test Date:  01/01/2018  Patient: [name] DOB: [birth year] Physician: Narda Amber, DO  Sex: Female Height: 5\' 5"  Ref Phys: Hulan Saas, DO  ID#: [id number] Temp; 36.2C Technician:    Patient Complaints: This is a 67 year old female with history of prior lumbar surgery referred for evaluation of episodic bilateral leg numbness, worse left.  NCV & EMG Findings: Extensive electrodiagnostic testing of the right lower extremity and additional studies of the left shows:  1. Bilateral sural and superficial peroneal sensory responses are within normal limits. 2. Right peroneal motor response shows mildly reduced amplitude (2.0 mV) at the extensor digitorum brevis, and is normal at the tibialis anterior. Left peroneal and bilateral tibial motor responses are within normal limits. 3. There is no evidence of active or chronic motor axon loss changes affecting any of the tested muscles. Motor unit configuration and recruitment pattern is within normal limits.  Impression: This is a normal study of the lower extremities. In particular, there is no evidence of a large fiber sensorimotor polyneuropathy or lumbosacral radiculopathy.   ___________________________ Narda Amber, DO    Nerve Conduction Studies Anti Sensory Summary Table   Site NR Peak (ms) Norm Peak (ms) P-T Amp (V) Norm P-T Amp  Left Sup Peroneal Anti Sensory (Ant Lat Mall)  12 cm    2.9 <4.6 7.5 >3  Right Sup Peroneal Anti Sensory (Ant Lat Mall)  12 cm    2.3 <4.6 7.6 >3  Left Sural Anti Sensory (Lat Mall)  Calf    3.3 <4.6 8.9 >3  Right Sural Anti Sensory (Lat Mall)  Calf    2.6 <4.6 10.3 >3   Motor Summary Table   Site NR Onset (ms) Norm Onset (ms) O-P Amp (mV) Norm O-P Amp Site1 Site2 Delta-0 (ms) Dist (cm) Vel (m/s) Norm Vel (m/s)  Left Peroneal Motor (Ext Dig Brev)  Ankle    3.8 <6.0 3.4 >2.5 B  Fib Ankle 8.3 39.0 47 >40  B Fib    12.1  2.8  Poplt B Fib 1.0 7.0 70 >40  Poplt    13.1  2.7         Right Peroneal Motor (Ext Dig Brev)  Ankle    3.5 <6.0 2.0 >2.5 B Fib Ankle 7.5 35.0 47 >40  B Fib    11.0  2.0  Poplt B Fib 1.4 9.0 64 >40  Poplt    12.4  2.0         Left Peroneal TA Motor (Tib Ant)  Fib Head    3.0 <4.5 4.6 >3 Poplit Fib Head 1.0 8.0 80 >40  Poplit    4.0  4.6         Right Peroneal TA Motor (Tib Ant)  Fib Head    2.4 <4.5 4.2 >3 Poplit Fib Head 1.2 8.0 67 >40  Poplit    3.6  4.1         Left Tibial Motor (Abd Weckerly Brev)  Ankle    5.5 <6.0 7.4 >4 Knee Ankle 8.9 39.0 44 >40  Knee    14.4  3.3         Right Tibial Motor (Abd Asa Brev)  Ankle    4.4 <6.0 8.1 >4 Knee Ankle 7.3 39.0 53 >40  Knee    11.7  4.9          H Reflex Studies  NR H-Lat (ms) Lat Norm (ms) L-R H-Lat (ms)  Left Tibial (Gastroc)     34.29 <35 0.00  Right Tibial (Gastroc)     34.29 <35 0.00   EMG   Side Muscle Ins Act Fibs Psw Fasc Number Recrt Dur Dur. Amp Amp. Poly Poly. Comment  Right AntTibialis Nml Nml Nml Nml Nml Nml Nml Nml Nml Nml Nml Nml N/A  Right Gastroc Nml Nml Nml Nml 1- Mod-V Nml Nml Nml Nml Nml Nml N/A  Right Flex Dig Long Nml Nml Nml Nml Nml Nml Nml Nml Nml Nml Nml Nml N/A  Right RectFemoris Nml Nml Nml Nml Nml Nml Nml Nml Nml Nml Nml Nml N/A  Right GluteusMed Nml Nml Nml Nml Nml Nml Nml Nml Nml Nml Nml Nml N/A  Right BicepsFemS Nml Nml Nml Nml Nml Nml Nml Nml Nml Nml Nml Nml N/A  Left BicepsFemS Nml Nml Nml Nml Nml Nml Nml Nml Nml Nml Nml Nml N/A  Left AntTibialis Nml Nml Nml Nml Nml Nml Nml Nml Nml Nml Nml Nml N/A  Left Gastroc Nml Nml Nml Nml 1- Mod-V Nml Nml Nml Nml Nml Nml N/A  Left Flex Dig Long Nml Nml Nml Nml Nml Nml Nml Nml Nml Nml Nml Nml N/A  Left RectFemoris Nml Nml Nml Nml Nml Nml Nml Nml Nml Nml Nml Nml N/A  Left GluteusMed Nml Nml Nml Nml Nml Nml Nml Nml Nml Nml Nml Nml N/A      Waveforms:

## 2018-01-01 NOTE — Assessment & Plan Note (Signed)
Worsening pain.  Bilateral injections given today.  Discussed icing regimen and home exercise.  Discussed which activities to do which was to avoid.  Increase activity slowly.  Follow-up again in 4 weeks and could be candidate for Visco supplementation.

## 2018-01-01 NOTE — Patient Instructions (Signed)
Good to see you  [name] is your friend.  We will get you approved for the other injections.  We will get test for the nerves See me again in 4 weeks

## 2018-01-01 NOTE — Assessment & Plan Note (Signed)
EMG of the lower extremities ordered today secondary to numbness.  Significance more of the back or possibly more of a peripheral difficulty.

## 2018-01-22 DIAGNOSIS — R0602 Shortness of breath: Secondary | ICD-10-CM | POA: Diagnosis not present

## 2018-01-22 DIAGNOSIS — I509 Heart failure, unspecified: Secondary | ICD-10-CM | POA: Diagnosis not present

## 2018-01-22 DIAGNOSIS — R600 Localized edema: Secondary | ICD-10-CM | POA: Diagnosis not present

## 2018-01-23 ENCOUNTER — Encounter: Payer: Medicare HMO | Admitting: Neurology

## 2018-01-24 ENCOUNTER — Other Ambulatory Visit (HOSPITAL_COMMUNITY): Payer: Self-pay | Admitting: Internal Medicine

## 2018-01-24 DIAGNOSIS — R600 Localized edema: Secondary | ICD-10-CM

## 2018-01-24 DIAGNOSIS — R0602 Shortness of breath: Secondary | ICD-10-CM

## 2018-01-29 ENCOUNTER — Ambulatory Visit (HOSPITAL_COMMUNITY): Payer: Medicare HMO | Attending: Internal Medicine

## 2018-01-29 ENCOUNTER — Other Ambulatory Visit: Payer: Self-pay

## 2018-01-29 DIAGNOSIS — I10 Essential (primary) hypertension: Secondary | ICD-10-CM | POA: Diagnosis not present

## 2018-01-29 DIAGNOSIS — M3501 Sicca syndrome with keratoconjunctivitis: Secondary | ICD-10-CM | POA: Diagnosis not present

## 2018-01-29 DIAGNOSIS — R0602 Shortness of breath: Secondary | ICD-10-CM

## 2018-01-29 DIAGNOSIS — Z87891 Personal history of nicotine dependence: Secondary | ICD-10-CM | POA: Insufficient documentation

## 2018-01-29 DIAGNOSIS — Z8673 Personal history of transient ischemic attack (TIA), and cerebral infarction without residual deficits: Secondary | ICD-10-CM | POA: Diagnosis not present

## 2018-01-29 DIAGNOSIS — M150 Primary generalized (osteo)arthritis: Secondary | ICD-10-CM | POA: Diagnosis not present

## 2018-01-29 DIAGNOSIS — M064 Inflammatory polyarthropathy: Secondary | ICD-10-CM | POA: Diagnosis not present

## 2018-01-29 DIAGNOSIS — M79643 Pain in unspecified hand: Secondary | ICD-10-CM | POA: Diagnosis not present

## 2018-01-29 DIAGNOSIS — R600 Localized edema: Secondary | ICD-10-CM | POA: Diagnosis not present

## 2018-01-29 DIAGNOSIS — J45909 Unspecified asthma, uncomplicated: Secondary | ICD-10-CM | POA: Insufficient documentation

## 2018-01-29 DIAGNOSIS — I081 Rheumatic disorders of both mitral and tricuspid valves: Secondary | ICD-10-CM | POA: Insufficient documentation

## 2018-01-29 DIAGNOSIS — M549 Dorsalgia, unspecified: Secondary | ICD-10-CM | POA: Diagnosis not present

## 2018-01-29 DIAGNOSIS — M329 Systemic lupus erythematosus, unspecified: Secondary | ICD-10-CM | POA: Diagnosis not present

## 2018-01-29 DIAGNOSIS — Z79899 Other long term (current) drug therapy: Secondary | ICD-10-CM | POA: Diagnosis not present

## 2018-01-29 DIAGNOSIS — M25532 Pain in left wrist: Secondary | ICD-10-CM | POA: Diagnosis not present

## 2018-01-29 DIAGNOSIS — M25531 Pain in right wrist: Secondary | ICD-10-CM | POA: Diagnosis not present

## 2018-01-29 NOTE — Progress Notes (Signed)
[name] [name],  [address] Phone: [phone] Subjective:      CC: Bilateral knee pain  [name]  [name] is a 67 y.o. female coming in with complaint of knee pain. States that the right knee is still giving her some pain. States there is a burning sensation medial at night.  Bilateral knee arthritis.  Given injections 4 weeks ago.  Feeling about 80% better.  No giving out on him.  Using a cane though still for walking most of the time.     Past Medical History:  Diagnosis Date  . Acid reflux   . Arthritis   . Asthma   . Hypertension   . Lupus ([place]) 07/13/2011  . 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 Stroke [facility])    lt side weakness   Past Surgical History:  Procedure Laterality Date  . ABDOMINAL HYSTERECTOMY    . BACK SURGERY    . CHOLECYSTECTOMY    . FOOT SURGERY     x2  . MASS EXCISION     back  . rortator     rotator cuff   Social History   Socioeconomic History  . Marital status: Married    Spouse name: Not on file  . Number of children: Not on file  . Years of education: Not on file  . Highest education level: Not on file  Occupational History  . Not on file  Social Needs  . Financial resource strain: Not on file  . Food insecurity:    Worry: Not on file    Inability: Not on file  . Transportation needs:    Medical: Not on file    Non-medical: Not on file  Tobacco Use  . Smoking status: Former Smoker    Packs/day: 1.00    Years: 10.00    Pack years: 10.00    Last attempt to quit: 06/17/1994    Years since quitting: 23.6  . Smokeless tobacco: Never Used  Substance and Sexual Activity  . Alcohol use: Yes    Alcohol/week: 0.0 oz  . Drug use: No  . Sexual activity: Not Currently  Lifestyle  . Physical activity:    Days per week: Not on file    Minutes per session: Not on file  . Stress: Not on file  Relationships  . Social connections:    Talks on phone:  Not on file    Gets together: Not on file    Attends religious service: Not on file    Active member of club or organization: Not on file    Attends meetings of clubs or organizations: Not on file    Relationship status: Not on file  Other Topics Concern  . Not on file  Social History Narrative  . Not on file   Allergies  Allergen Reactions  . Ciprofloxacin Itching  . E-Mycin [Erythromycin Base] Nausea And Vomiting  . Oxycodone Other (See Comments)    Hallucination.   No family history on file.  No family history of autoimmune   Past medical history, social, surgical and family history all reviewed in electronic medical record.  No pertanent information unless stated regarding to the chief complaint.   Review of Systems:Review of systems updated and as accurate as of 01/30/18  No headache, visual changes, nausea, vomiting, diarrhea, constipation, dizziness, abdominal pain, skin rash, fevers, chills, night sweats, weight loss, swollen lymph nodes, chest pain, shortness of breath, mood changes.  Positive muscle  aches and body aches  Objective  Blood pressure 130/90, pulse 62, height 5\' 5"  (1.651 m), weight 212 lb (96.2 kg), SpO2 96 %. Systems examined below as of 01/30/18   General: No apparent distress alert and oriented x3 mood and affect normal, dressed appropriately.  HEENT: Pupils equal, extraocular movements intact  Respiratory: Patient's speak in full sentences and does not appear short of breath  Cardiovascular: No lower extremity edema, non tender, no erythema  Skin: Warm dry intact with no signs of infection or rash on extremities or on axial skeleton.  Abdomen: Soft nontender  Neuro: Cranial nerves II through XII are intact, neurovascularly intact in all extremities with 2+ DTRs and 2+ pulses.  Lymph: No lymphadenopathy of posterior or anterior cervical chain or axillae bilaterally.  Gait antalgic MSK:  Non tender with full range of motion and good stability and  symmetric strength and tone of shoulders, elbows, wrist, hip, and ankles bilaterally.  Mild arthritic changes of multiple other joints  Knee: Bilateral valgus deformity noted.  Abnormal thigh to calf ratio.  Tender to palpation over medial and PF joint line.  Less than previous exam ROM full in flexion and extension and lower leg rotation. instability with valgus force.  painful patellar compression. Patellar glide with mild crepitus. Patellar and quadriceps tendons unremarkable. Hamstring and quadriceps strength is normal.    Impression and Recommendations:     This case required medical decision making of moderate complexity.      Note: This dictation was prepared with Dragon dictation along with smaller phrase technology. Any transcriptional errors that result from this process are unintentional.

## 2018-01-30 ENCOUNTER — Encounter: Payer: Self-pay | Admitting: Family Medicine

## 2018-01-30 ENCOUNTER — Ambulatory Visit: Payer: Medicare HMO | Admitting: Family Medicine

## 2018-01-30 DIAGNOSIS — M170 Bilateral primary osteoarthritis of knee: Secondary | ICD-10-CM

## 2018-01-30 NOTE — Assessment & Plan Note (Signed)
Doing well status post the injections.  Discussed we can repeat every 3 months as needed.  The patient would be a candidate for Visco supplementation and have approval.  Worsening pain patient will call otherwise follow-up again in 6 to 8 weeks.

## 2018-02-12 DIAGNOSIS — I872 Venous insufficiency (chronic) (peripheral): Secondary | ICD-10-CM | POA: Diagnosis not present

## 2018-02-12 DIAGNOSIS — R0602 Shortness of breath: Secondary | ICD-10-CM | POA: Diagnosis not present

## 2018-02-12 DIAGNOSIS — R600 Localized edema: Secondary | ICD-10-CM | POA: Diagnosis not present

## 2018-03-02 DIAGNOSIS — M533 Sacrococcygeal disorders, not elsewhere classified: Secondary | ICD-10-CM | POA: Diagnosis not present

## 2018-03-13 NOTE — Progress Notes (Signed)
[name] [place] [address] [place], [place] [address] Phone: [phone] Subjective:   [name], am serving as a scribe for Dr. Hulan Saas. I'm seeing this patient by the request  of:    CC: Bilateral knee pain  SEG:[id number]  [name] is a 67 y.o. female coming in with complaint of bilateral knee pain. She said that her pain has not changed. Right is greater than left. She does have relief from injections and would like to get them today.  Had responded previously to injections but now having worsening symptoms.  Increasing instability and swelling.       Past Medical History:  Diagnosis Date  . Acid reflux   . Arthritis   . Asthma   . Hypertension   . Lupus ([place]) 07/13/2011  . 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 Stroke [facility])    lt side weakness   Past Surgical History:  Procedure Laterality Date  . ABDOMINAL HYSTERECTOMY    . BACK SURGERY    . CHOLECYSTECTOMY    . FOOT SURGERY     x2  . MASS EXCISION     back  . rortator     rotator cuff   Social History   Socioeconomic History  . Marital status: Married    Spouse name: Not on file  . Number of children: Not on file  . Years of education: Not on file  . Highest education level: Not on file  Occupational History  . Not on file  Social Needs  . Financial resource strain: Not on file  . Food insecurity:    Worry: Not on file    Inability: Not on file  . Transportation needs:    Medical: Not on file    Non-medical: Not on file  Tobacco Use  . Smoking status: Former Smoker    Packs/day: 1.00    Years: 10.00    Pack years: 10.00    Last attempt to quit: 06/17/1994    Years since quitting: 23.7  . Smokeless tobacco: Never Used  Substance and Sexual Activity  . Alcohol use: Yes    Alcohol/week: 0.0 standard drinks  . Drug use: [name]  . Sexual activity: Not Currently  Lifestyle  . Physical activity:    Days per week: Not on file    Minutes  per session: Not on file  . Stress: Not on file  Relationships  . Social connections:    Talks on phone: Not on file    Gets together: Not on file    Attends religious service: Not on file    Active member of club or organization: Not on file    Attends meetings of clubs or organizations: Not on file    Relationship status: Not on file  Other Topics Concern  . Not on file  Social History Narrative  . Not on file   Allergies  Allergen Reactions  . Ciprofloxacin Itching  . E-Mycin [Erythromycin Base] Nausea And Vomiting  . Oxycodone Other (See Comments)    Hallucination.   [name] family history on file.  Current Outpatient Medications (Endocrine & Metabolic):  .  predniSONE (DELTASONE) 1 MG tablet, Take 2 mg by mouth daily.   Current Outpatient Medications (Cardiovascular):  .  furosemide (LASIX) 80 MG tablet, Take 80 mg by mouth daily. Marland Kitchen  losartan (COZAAR) 100 MG tablet, Take 100 mg by mouth daily.   Current Outpatient Medications (Analgesics):  .  aspirin 81 MG  tablet, Take 81 mg by mouth daily.    Current Outpatient Medications (Other):  .  Biotin 1000 MCG tablet, Take 1,000 mcg by mouth daily. .  calcium carbonate (OS-CAL) 600 MG TABS, Take 600 mg by mouth daily. .  cholecalciferol (VITAMIN D) 1000 UNITS tablet, Take 1,000 Units by mouth daily.  Marland Kitchen  FIBER PO, Take 1 tablet by mouth daily. .  hydroxychloroquine (PLAQUENIL) 200 MG tablet, Take 400 mg by mouth daily.  .  Hypromellose (ARTIFICIAL TEARS OP), Apply 1 drop to eye 4 (four) times daily as needed (for dry eyes). .  Multiple Vitamin (MULTIVITAMIN) tablet, Take 1 tablet by mouth daily. .  potassium chloride SA (K-DUR,KLOR-CON) 20 MEQ tablet, Take 40 mEq by mouth daily.  .  Turmeric 500 MG CAPS, Take 1 capsule by mouth daily. .  Vitamin D, Ergocalciferol, (DRISDOL) 50000 units CAPS capsule, Take 1 capsule (50,000 Units total) by mouth every 7 (seven) days.    Past medical history, social, surgical and family history  all reviewed in electronic medical record.  [name] pertanent information unless stated regarding to the chief complaint.   Review of Systems:  [name] headache, visual changes, nausea, vomiting, diarrhea, constipation, dizziness, abdominal pain, skin rash, fevers, chills, night sweats, weight loss, swollen lymph nodes, body aches,, chest pain, shortness of breath, mood changes.   Objective  Blood pressure 132/84, pulse 77, height 5\' 5"  (1.651 m), weight 210 lb (95.3 kg), SpO2 95 %.   General: [name] apparent distress alert and oriented x3 mood and affect normal, dressed appropriately.  HEENT: Pupils equal, extraocular movements intact  Respiratory: Patient's speak in full sentences and does not appear short of breath  Cardiovascular: [name] lower extremity edema, non tender, [name] erythema  Skin: Warm dry intact with [name] signs of infection or rash on extremities or on axial skeleton.  Abdomen: Soft nontender  Neuro: Cranial nerves II through XII are intact, neurovascularly intact in all extremities with 2+ DTRs and 2+ pulses.  Lymph: [name] lymphadenopathy of posterior or anterior cervical chain or axillae bilaterally.  Gait normal with good balance and coordination.  MSK:  Non tender with full range of motion and good stability and symmetric strength and tone of shoulders, elbows, wrist, hip, and ankles bilaterally.   Knee: Bilateral valgus deformity noted.  Abnormal thigh to calf ratio.  Tender to palpation over medial and PF joint line.  ROM full in flexion and extension and lower leg rotation. instability with valgus force.  painful patellar compression. Patellar glide with moderate crepitus. Patellar and quadriceps tendons unremarkable. Hamstring and quadriceps strength is normal.   After informed written and verbal consent, patient was seated on exam table. Right knee was prepped with alcohol swab and utilizing anterolateral approach, patient's right knee space was injected with22 mg/1 mL of Monovisc  (sodium hyaluronate) in a prefilled syringe was injected easily into the knee through a 22-gauge needle..Patient tolerated the procedure well without immediate complications.  After informed written and verbal consent, patient was seated on exam table. Left knee was prepped with alcohol swab and utilizing anterolateral approach, patient's left knee space was injected wit 22mg /1 mL of Monovisc (sodium hyaluronate) in a prefilled syringe was injected easily into the knee through a 22-gauge needle..Patient tolerated the procedure well without immediate complications.    Impression and Recommendations:     This case required medical decision making of moderate complexity. The above documentation has been reviewed and is accurate and complete Lyndal Pulley, DO  Note: This dictation was prepared with Dragon dictation along with smaller phrase technology. Any transcriptional errors that result from this process are unintentional.

## 2018-03-14 ENCOUNTER — Ambulatory Visit: Payer: Medicare HMO | Admitting: Family Medicine

## 2018-03-14 DIAGNOSIS — M170 Bilateral primary osteoarthritis of knee: Secondary | ICD-10-CM

## 2018-03-14 NOTE — Patient Instructions (Addendum)
Good to see you  [name] is your friend.  Try to stay active Will take some time for these injections to work.  Keep being you  Make an appoint in 6 weeks just in case.

## 2018-03-14 NOTE — Assessment & Plan Note (Signed)
Monovisc given.  Discussed icing regimen and home exercises.  Discussed which includes letter which wants to avoid.  Increase activity as tolerated.  Follow-up again in 4 to 6 weeks

## 2018-03-21 DIAGNOSIS — H25013 Cortical age-related cataract, bilateral: Secondary | ICD-10-CM | POA: Diagnosis not present

## 2018-03-21 DIAGNOSIS — Z79899 Other long term (current) drug therapy: Secondary | ICD-10-CM | POA: Diagnosis not present

## 2018-03-21 DIAGNOSIS — H524 Presbyopia: Secondary | ICD-10-CM | POA: Diagnosis not present

## 2018-03-21 DIAGNOSIS — H40023 Open angle with borderline findings, high risk, bilateral: Secondary | ICD-10-CM | POA: Diagnosis not present

## 2018-03-28 DIAGNOSIS — M533 Sacrococcygeal disorders, not elsewhere classified: Secondary | ICD-10-CM | POA: Diagnosis not present

## 2018-04-03 DIAGNOSIS — J4520 Mild intermittent asthma, uncomplicated: Secondary | ICD-10-CM | POA: Diagnosis not present

## 2018-04-03 DIAGNOSIS — I10 Essential (primary) hypertension: Secondary | ICD-10-CM | POA: Diagnosis not present

## 2018-04-03 DIAGNOSIS — D696 Thrombocytopenia, unspecified: Secondary | ICD-10-CM | POA: Diagnosis not present

## 2018-04-10 DIAGNOSIS — I10 Essential (primary) hypertension: Secondary | ICD-10-CM | POA: Diagnosis not present

## 2018-04-10 DIAGNOSIS — E876 Hypokalemia: Secondary | ICD-10-CM | POA: Diagnosis not present

## 2018-04-10 DIAGNOSIS — D696 Thrombocytopenia, unspecified: Secondary | ICD-10-CM | POA: Diagnosis not present

## 2018-04-24 DIAGNOSIS — M533 Sacrococcygeal disorders, not elsewhere classified: Secondary | ICD-10-CM | POA: Diagnosis not present

## 2018-04-25 ENCOUNTER — Ambulatory Visit: Payer: Medicare HMO | Admitting: Family Medicine

## 2018-05-01 ENCOUNTER — Emergency Department (HOSPITAL_COMMUNITY): Payer: Medicare HMO

## 2018-05-01 ENCOUNTER — Other Ambulatory Visit: Payer: Self-pay

## 2018-05-01 ENCOUNTER — Encounter (HOSPITAL_COMMUNITY): Payer: Self-pay | Admitting: Emergency Medicine

## 2018-05-01 ENCOUNTER — Observation Stay (HOSPITAL_COMMUNITY)
Admission: EM | Admit: 2018-05-01 | Discharge: 2018-05-03 | Disposition: A | Discharge status: Home / Self Care | Payer: Medicare HMO | Attending: Internal Medicine | Admitting: Internal Medicine

## 2018-05-01 DIAGNOSIS — Z7982 Long term (current) use of aspirin: Secondary | ICD-10-CM | POA: Diagnosis not present

## 2018-05-01 DIAGNOSIS — J45909 Unspecified asthma, uncomplicated: Secondary | ICD-10-CM | POA: Diagnosis present

## 2018-05-01 DIAGNOSIS — Z87891 Personal history of nicotine dependence: Secondary | ICD-10-CM | POA: Insufficient documentation

## 2018-05-01 DIAGNOSIS — Z8572 Personal history of non-Hodgkin lymphomas: Secondary | ICD-10-CM | POA: Diagnosis not present

## 2018-05-01 DIAGNOSIS — L930 Discoid lupus erythematosus: Secondary | ICD-10-CM | POA: Diagnosis present

## 2018-05-01 DIAGNOSIS — M329 Systemic lupus erythematosus, unspecified: Secondary | ICD-10-CM | POA: Insufficient documentation

## 2018-05-01 DIAGNOSIS — M19012 Primary osteoarthritis, left shoulder: Secondary | ICD-10-CM | POA: Diagnosis present

## 2018-05-01 DIAGNOSIS — I69354 Hemiplegia and hemiparesis following cerebral infarction affecting left non-dominant side: Secondary | ICD-10-CM | POA: Diagnosis not present

## 2018-05-01 DIAGNOSIS — I10 Essential (primary) hypertension: Secondary | ICD-10-CM | POA: Diagnosis not present

## 2018-05-01 DIAGNOSIS — M069 Rheumatoid arthritis, unspecified: Secondary | ICD-10-CM | POA: Insufficient documentation

## 2018-05-01 DIAGNOSIS — R9431 Abnormal electrocardiogram [ECG] [EKG]: Secondary | ICD-10-CM | POA: Diagnosis not present

## 2018-05-01 DIAGNOSIS — K219 Gastro-esophageal reflux disease without esophagitis: Secondary | ICD-10-CM | POA: Diagnosis present

## 2018-05-01 DIAGNOSIS — R0789 Other chest pain: Secondary | ICD-10-CM | POA: Diagnosis not present

## 2018-05-01 DIAGNOSIS — R079 Chest pain, unspecified: Secondary | ICD-10-CM | POA: Diagnosis not present

## 2018-05-01 DIAGNOSIS — Z79899 Other long term (current) drug therapy: Secondary | ICD-10-CM | POA: Diagnosis not present

## 2018-05-01 DIAGNOSIS — N644 Mastodynia: Secondary | ICD-10-CM | POA: Diagnosis not present

## 2018-05-01 DIAGNOSIS — Z881 Allergy status to other antibiotic agents status: Secondary | ICD-10-CM | POA: Diagnosis not present

## 2018-05-01 DIAGNOSIS — M1990 Unspecified osteoarthritis, unspecified site: Secondary | ICD-10-CM | POA: Diagnosis present

## 2018-05-01 DIAGNOSIS — Z885 Allergy status to narcotic agent status: Secondary | ICD-10-CM | POA: Diagnosis not present

## 2018-05-01 LAB — CBC WITH DIFFERENTIAL/PLATELET
ABS IMMATURE GRANULOCYTES: 0.01 10*3/uL (ref 0.00–0.07)
BASOS ABS: 0 10*3/uL (ref 0.0–0.1)
Basophils Relative: 1 %
EOS PCT: 1 %
Eosinophils Absolute: 0.1 10*3/uL (ref 0.0–0.5)
HCT: 40.1 % (ref 36.0–46.0)
HEMOGLOBIN: 12.2 g/dL (ref 12.0–15.0)
Immature Granulocytes: 0 %
LYMPHS ABS: 1.8 10*3/uL (ref 0.7–4.0)
LYMPHS PCT: 34 %
MCH: 29.8 pg (ref 26.0–34.0)
MCHC: 30.4 g/dL (ref 30.0–36.0)
MCV: 98 fL (ref 80.0–100.0)
MONO ABS: 0.5 10*3/uL (ref 0.1–1.0)
Monocytes Relative: 10 %
Neutro Abs: 2.9 10*3/uL (ref 1.7–7.7)
Neutrophils Relative %: 54 %
Platelets: 131 10*3/uL — ABNORMAL LOW (ref 150–400)
RBC: 4.09 MIL/uL (ref 3.87–5.11)
RDW: 13.2 % (ref 11.5–15.5)
WBC: 5.3 10*3/uL (ref 4.0–10.5)
nRBC: 0 % (ref 0.0–0.2)

## 2018-05-01 LAB — PHOSPHORUS: Phosphorus: 2.5 mg/dL (ref 2.5–4.6)

## 2018-05-01 LAB — MAGNESIUM: Magnesium: 1.9 mg/dL (ref 1.7–2.4)

## 2018-05-01 LAB — COMPREHENSIVE METABOLIC PANEL
ALBUMIN: 3.5 g/dL (ref 3.5–5.0)
ALT: 19 U/L (ref 0–44)
AST: 22 U/L (ref 15–41)
Alkaline Phosphatase: 95 U/L (ref 38–126)
Anion gap: 8 (ref 5–15)
BUN: 13 mg/dL (ref 8–23)
CHLORIDE: 105 mmol/L (ref 98–111)
CO2: 27 mmol/L (ref 22–32)
Calcium: 8.9 mg/dL (ref 8.9–10.3)
Creatinine, Ser: 0.9 mg/dL (ref 0.44–1.00)
GFR calc Af Amer: >60 mL/min (ref >60)
GLUCOSE: 113 mg/dL — AB (ref 70–99)
POTASSIUM: 3.3 mmol/L — AB (ref 3.5–5.1)
SODIUM: 140 mmol/L (ref 135–145)
Total Bilirubin: 0.7 mg/dL (ref 0.3–1.2)
Total Protein: 6.5 g/dL (ref 6.5–8.1)

## 2018-05-01 LAB — TROPONIN I

## 2018-05-01 MED ORDER — HYDRALAZINE HCL 20 MG/ML IJ SOLN
10.0000 mg | INTRAMUSCULAR | Status: DC | PRN
  Administered 2018-05-01: 10 mg via INTRAVENOUS
  Filled 2018-05-01: qty 1

## 2018-05-01 MED ORDER — POTASSIUM CHLORIDE CRYS ER 20 MEQ PO TBCR: 60.0000 meq | EXTENDED_RELEASE_TABLET | Freq: Every day | ORAL | Status: DC

## 2018-05-01 MED ORDER — LABETALOL HCL 5 MG/ML IV SOLN
5.0000 mg | Freq: Once | INTRAVENOUS | Status: AC
  Administered 2018-05-01: 5 mg via INTRAVENOUS
  Filled 2018-05-01: qty 4

## 2018-05-01 MED ORDER — ONE-DAILY MULTI VITAMINS PO TABS: 1.0000 | ORAL_TABLET | Freq: Every day | ORAL | Status: DC

## 2018-05-01 MED ORDER — ACETAMINOPHEN 325 MG PO TABS
650.0000 mg | ORAL_TABLET | ORAL | Status: DC | PRN
  Administered 2018-05-01 – 2018-05-02 (×2): 650 mg via ORAL
  Filled 2018-05-01 (×2): qty 2

## 2018-05-01 MED ORDER — VITAMIN D3 25 MCG (1000 UNIT) PO TABS
1000.0000 [IU] | ORAL_TABLET | Freq: Every day | ORAL | Status: DC
  Administered 2018-05-01 – 2018-05-03 (×2): 1000 [IU] via ORAL
  Filled 2018-05-01 (×2): qty 1

## 2018-05-01 MED ORDER — CALCIUM CARBONATE 600 MG PO TABS: 600.0000 mg | ORAL_TABLET | Freq: Every day | ORAL | Status: DC

## 2018-05-01 MED ORDER — HYDROXYCHLOROQUINE SULFATE 200 MG PO TABS
400.0000 mg | ORAL_TABLET | Freq: Every day | ORAL | Status: DC
  Administered 2018-05-01 – 2018-05-03 (×3): 400 mg via ORAL
  Filled 2018-05-01 (×4): qty 2

## 2018-05-01 MED ORDER — LOSARTAN POTASSIUM 50 MG PO TABS
100.0000 mg | ORAL_TABLET | Freq: Every day | ORAL | Status: DC
  Administered 2018-05-01 – 2018-05-03 (×3): 100 mg via ORAL
  Filled 2018-05-01 (×3): qty 2

## 2018-05-01 MED ORDER — ONDANSETRON HCL 4 MG/2ML IJ SOLN
4.0000 mg | Freq: Four times a day (QID) | INTRAMUSCULAR | Status: DC | PRN
  Administered 2018-05-01 – 2018-05-02 (×2): 4 mg via INTRAVENOUS
  Filled 2018-05-01 (×2): qty 2

## 2018-05-01 MED ORDER — ENOXAPARIN SODIUM 40 MG/0.4ML ~~LOC~~ SOLN: 40.0000 mg | SUBCUTANEOUS | Status: DC

## 2018-05-01 MED ORDER — CALCIUM CARBONATE 1250 (500 CA) MG PO TABS
1.0000 | ORAL_TABLET | Freq: Every day | ORAL | Status: DC
  Administered 2018-05-01 – 2018-05-03 (×2): 500 mg via ORAL
  Filled 2018-05-01 (×2): qty 1

## 2018-05-01 MED ORDER — ASPIRIN EC 81 MG PO TBEC
81.0000 mg | DELAYED_RELEASE_TABLET | Freq: Every day | ORAL | Status: DC
  Administered 2018-05-01 – 2018-05-03 (×2): 81 mg via ORAL
  Filled 2018-05-01 (×2): qty 1

## 2018-05-01 MED ORDER — MAGNESIUM SULFATE 2 GM/50ML IV SOLN
2.0000 g | Freq: Once | INTRAVENOUS | Status: AC
  Administered 2018-05-01: 2 g via INTRAVENOUS
  Filled 2018-05-01: qty 50

## 2018-05-01 MED ORDER — FUROSEMIDE 40 MG PO TABS
80.0000 mg | ORAL_TABLET | Freq: Every day | ORAL | Status: DC
  Administered 2018-05-01 – 2018-05-03 (×3): 80 mg via ORAL
  Filled 2018-05-01 (×3): qty 2

## 2018-05-01 MED ORDER — KETOROLAC TROMETHAMINE 30 MG/ML IJ SOLN
30.0000 mg | Freq: Once | INTRAMUSCULAR | Status: AC
  Administered 2018-05-01: 30 mg via INTRAVENOUS
  Filled 2018-05-01: qty 1

## 2018-05-01 MED ORDER — POLYVINYL ALCOHOL 1.4 % OP SOLN
1.0000 [drp] | Freq: Four times a day (QID) | OPHTHALMIC | Status: DC | PRN
  Filled 2018-05-01: qty 15

## 2018-05-01 MED ORDER — NITROGLYCERIN 0.4 MG SL SUBL: 0.4000 mg | SUBLINGUAL_TABLET | SUBLINGUAL | Status: DC | PRN

## 2018-05-01 MED ORDER — LATANOPROST 0.005 % OP SOLN
1.0000 [drp] | Freq: Every day | OPHTHALMIC | Status: DC
  Administered 2018-05-01 – 2018-05-02 (×2): 1 [drp] via OPHTHALMIC
  Filled 2018-05-01: qty 2.5

## 2018-05-01 MED ORDER — LOSARTAN POTASSIUM 50 MG PO TABS: 100.0000 mg | ORAL_TABLET | Freq: Every day | ORAL | Status: DC

## 2018-05-01 MED ORDER — ENOXAPARIN SODIUM 40 MG/0.4ML ~~LOC~~ SOLN
40.0000 mg | Freq: Every day | SUBCUTANEOUS | Status: DC
  Administered 2018-05-01 – 2018-05-02 (×2): 40 mg via SUBCUTANEOUS
  Filled 2018-05-01 (×3): qty 0.4

## 2018-05-01 MED ORDER — CYCLOBENZAPRINE HCL 10 MG PO TABS
10.0000 mg | ORAL_TABLET | Freq: Three times a day (TID) | ORAL | Status: DC | PRN
  Administered 2018-05-01: 10 mg via ORAL
  Filled 2018-05-01: qty 1

## 2018-05-01 MED ORDER — POTASSIUM CHLORIDE CRYS ER 20 MEQ PO TBCR
40.0000 meq | EXTENDED_RELEASE_TABLET | Freq: Once | ORAL | Status: AC
  Administered 2018-05-01: 40 meq via ORAL
  Filled 2018-05-01: qty 2

## 2018-05-01 MED ORDER — ADULT MULTIVITAMIN W/MINERALS CH
1.0000 | ORAL_TABLET | Freq: Every day | ORAL | Status: DC
  Administered 2018-05-01 – 2018-05-03 (×2): 1 via ORAL
  Filled 2018-05-01 (×2): qty 1

## 2018-05-01 NOTE — Progress Notes (Signed)
EKG taken in ED not viewable in patient chart.  Ngozi, RN in ED stated she would export again.

## 2018-05-01 NOTE — ED Provider Notes (Signed)
Emergency Department Provider Note   I have reviewed the triage vital signs and the nursing notes.   HISTORY  Chief Complaint Breast Pain   HPI [name] is a 67 y.o. female with a history of lymphoma, stroke, lupus and hypertension who presents to the emergency department today with left breast pain.  Patient states around 10:00 last night she was sitting doing normal activities that she started having sharp shooting pain across her left breast.  She states that it starts laterally and moves medially.  The redness before.  She is also had a dull pain in her left arm around the same time.  She states she has rotator cuff issues in her right arm but not her left that she knows of.  Some of the pain in her left arm gets worse with movement but some of it is not related to movement.  She thinks that the arm in the breast might hurt worse when she lays on the right side.  She tried Tylenol last night 1000 mg which did not help she took a second dose 4 hours later which seemed to help some but that she woke up this morning the pain was back that it was initially.  She states is intermittent and is not persistent.  She last had a mammogram sometime within the last year.  She has never anything this before.  Has not noticed any lumps, bumps or drainage.  No fevers.  No rashes.  No associated shortness of breath, diaphoresis or vomiting.  No lower extremity swelling No other associated or modifying symptoms.    Past Medical History:  Diagnosis Date  . Acid reflux   . Arthritis   . Asthma   . Hypertension   . Lupus ([place]) 07/13/2011  . 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 Stroke [facility])    lt side weakness    Patient Active Problem List   Diagnosis Date Noted  . Chest pain 05/01/2018  . Hypertension 05/01/2018  . Asthma 05/01/2018  . 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 Patellar contusion 02/03/2016  . 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  Radiculopathy 12/11/2014  . 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 Lupus ([place]) 07/13/2011  . OBESITY 05/14/2007  . INTERSTITIAL LUNG DISEASE 05/14/2007  . GERD 05/14/2007  . Rheumatoid arthritis ([place]) 05/14/2007    Past Surgical History:  Procedure Laterality Date  . ABDOMINAL HYSTERECTOMY    . BACK SURGERY    . CHOLECYSTECTOMY    . FOOT SURGERY     x2  . MASS EXCISION     back  . rortator     rotator cuff      Allergies Iodinated diagnostic agents; Ciprofloxacin; E-mycin [erythromycin base]; and Oxycodone  History reviewed. No pertinent family history.  Social History Social History   Tobacco Use  . Smoking status: Former Smoker    Packs/day: 1.00    Years: 10.00    Pack years: 10.00    Last attempt to quit: 06/17/1994    Years since quitting: 23.8  . Smokeless tobacco: Never Used  Substance Use Topics  . Alcohol use: Yes    Alcohol/week: 0.0 standard drinks  . Drug use: No    Review of Systems  All other systems negative except as documented in the HPI. All pertinent positives and negatives as reviewed in the HPI. ____________________________________________   PHYSICAL EXAM:  VITAL SIGNS: Blood pressure (!) 168/90, pulse 76, temperature 98.3 F (36.8 C), temperature source Oral,  resp. rate 19, height 5\' 6"  (1.676 m), weight 95.3 kg, SpO2 98 %.  Constitutional: Alert and oriented. Well appearing and in no acute distress. Breast: chaperoned by nurse, no masses, rash, drainage or deformities Eyes: Conjunctivae are normal. PERRL. EOMI. Head: Atraumatic. Nose: No congestion/rhinnorhea. Mouth/Throat: Mucous membranes are moist.  Oropharynx non-erythematous. Neck: No stridor.  No meningeal signs.   Cardiovascular: Normal rate, regular rhythm. Good peripheral circulation. Grossly normal heart sounds.   Respiratory: Normal respiratory effort.  No retractions. Lungs CTAB. Gastrointestinal: Soft and nontender. No distention.    Musculoskeletal: No lower extremity tenderness nor edema. No gross deformities of extremities. Neurologic:  Normal speech and language. No gross focal neurologic deficits are appreciated.  Skin:  Skin is warm, dry and intact. No rash noted.   ____________________________________________   LABS (all labs ordered are listed, but only abnormal results are displayed)  Labs Reviewed  CBC WITH DIFFERENTIAL/PLATELET - Abnormal; Notable for the following components:      Result Value   Platelets 131 (*)    All other components within normal limits  COMPREHENSIVE METABOLIC PANEL - Abnormal; Notable for the following components:   Potassium 3.3 (*)    Glucose, Bld 113 (*)    All other components within normal limits  TROPONIN I  MAGNESIUM  PHOSPHORUS  TROPONIN I  TROPONIN I   ____________________________________________  EKG   EKG Interpretation  Date/Time:  Tuesday May 01 2018 10:37:13 EDT Ventricular Rate:  71 PR Interval:    QRS Duration: 75 QT Interval:  412 QTC Calculation: 448 R Axis:   64 Text Interpretation:  Sinus rhythm Nonspecific T abnormalities, diffuse leads TWI in II, III, avf and V3-6 all new since 2008 Confirmed by Merrily Pew [phone]) on 05/01/2018 10:46:33 AM       ____________________________________________  RADIOLOGY  Dg Chest 2 View  Result Date: 05/01/2018 CLINICAL DATA:  Left breast pain EXAM: CHEST - 2 VIEW COMPARISON:  01/22/2018 FINDINGS: Mild cardiac enlargement without heart failure. Lungs are clear without infiltrate or effusion. IMPRESSION: No active cardiopulmonary disease. Electronically Signed   By: Franchot Gallo M.D.   On: 05/01/2018 10:28   Dg Humerus Left  Result Date: 05/01/2018 CLINICAL DATA:  Left upper arm pain beginning last night. EXAM: LEFT HUMERUS - 2+ VIEW COMPARISON:  None. FINDINGS: Advanced degenerative changes are present at the left shoulder. This predominantly involves the acromion. Calcific tendinopathy is also  evident. No acute abnormality is present. The humerus is within normal limits. The visualized clavicle and left lung fields are within normal limits. IMPRESSION: 1. Advanced degenerative changes involving the left acromion. 2. Calcifications at the rotator cuff insertion site. This could be posttraumatic, but most likely represents calcific tendinitis. Electronically Signed   By: San Morelle M.D.   On: 05/01/2018 10:28    ____________________________________________   PROCEDURES  Procedure(s) performed:   Procedures  CRITICAL CARE Performed by: Merrily Pew Total critical care time: 35 minutes Critical care time was exclusive of separately billable procedures and treating other patients. Critical care was necessary to treat or prevent imminent or life-threatening deterioration. Critical care was time spent personally by me on the following activities: development of treatment plan with patient and/or surrogate as well as nursing, discussions with consultants, evaluation of patient's response to treatment, examination of patient, obtaining history from patient or surrogate, ordering and performing treatments and interventions, ordering and review of laboratory studies, ordering and review of radiographic studies, pulse oximetry and re-evaluation of patient's condition.  ____________________________________________  INITIAL IMPRESSION / ASSESSMENT AND PLAN / ED COURSE  Unclear etiology for the patient's intermittent sharp shooting left chest pain however does have a history of lymphoma and with arm pain will evaluate cardiac and bony causes of her symptoms.  If these are negative for feel she can be discharged home.  Her blood pressure is little bit elevated but she has a history of the same.  We will defer to primary doctor.  Low suspicion for aortic dissection at this time will consider if any other abnormalities found.  BP continues to worsen in ED. ECG with new TWI in multiple  leads. In concert with her left sided breast/chest pain, concern for cardiac etiology, IV labetalol given, will admit for possible hypertensive crisis.      Pertinent labs & imaging results that were available during my care of the patient were reviewed by me and considered in my medical decision making (see chart for details).  ____________________________________________  FINAL CLINICAL IMPRESSION(S) / ED DIAGNOSES  Final diagnoses:  Breast pain  Hypertension, unspecified type  T wave inversion in EKG     MEDICATIONS GIVEN DURING THIS VISIT:  Medications  acetaminophen (TYLENOL) tablet 650 mg (has no administration in time range)  ondansetron (ZOFRAN) injection 4 mg (has no administration in time range)  magnesium sulfate IVPB 2 g 50 mL (2 g Intravenous New Bag/Given 05/01/18 1517)  enoxaparin (LOVENOX) injection 40 mg (40 mg Subcutaneous Given 05/01/18 1401)  aspirin EC tablet 81 mg (81 mg Oral Given 05/01/18 1355)  cholecalciferol (VITAMIN D) tablet 1,000 Units (1,000 Units Oral Given 05/01/18 1355)  furosemide (LASIX) tablet 80 mg (80 mg Oral Given 05/01/18 1355)  hydroxychloroquine (PLAQUENIL) tablet 400 mg (has no administration in time range)  polyvinyl alcohol (LIQUIFILM TEARS) 1.4 % ophthalmic solution 1 drop (has no administration in time range)  latanoprost (XALATAN) 0.005 % ophthalmic solution 1 drop (has no administration in time range)  potassium chloride SA (K-DUR,KLOR-CON) CR tablet 60 mEq (has no administration in time range)  calcium carbonate (OS-CAL - dosed in mg of elemental calcium) tablet 500 mg of elemental calcium (has no administration in time range)  losartan (COZAAR) tablet 100 mg (100 mg Oral Given 05/01/18 1355)  multivitamin with minerals tablet 1 tablet (1 tablet Oral Given 05/01/18 1355)  hydrALAZINE (APRESOLINE) injection 10 mg (has no administration in time range)  nitroGLYCERIN (NITROSTAT) SL tablet 0.4 mg (has no administration in time range)    labetalol (NORMODYNE,TRANDATE) injection 5 mg (5 mg Intravenous Given 05/01/18 1209)  potassium chloride SA (K-DUR,KLOR-CON) CR tablet 40 mEq (40 mEq Oral Given 05/01/18 1355)     NEW OUTPATIENT MEDICATIONS STARTED DURING THIS VISIT:  Current Discharge Medication List      Note:  This note was prepared with assistance of Dragon voice recognition software. Occasional wrong-word or sound-a-like substitutions may have occurred due to the inherent limitations of voice recognition software.   Merrily Pew, MD 05/01/18 [phone]

## 2018-05-01 NOTE — Progress Notes (Signed)
Took over care of patient at 1500, I agree with the previous nurses assessment.

## 2018-05-01 NOTE — ED Notes (Signed)
Attempted to obtain blood samples x 2. Will attempt to obtain with NT.

## 2018-05-01 NOTE — ED Notes (Signed)
IV team at bedside 

## 2018-05-01 NOTE — ED Notes (Signed)
ED TO INPATIENT HANDOFF REPORT  Name/Age/Gender [name] 67 y.o. female  Code Status    Code Status Orders  (From admission, onward)         Start     Ordered   05/01/18 1137  Full code  Continuous     05/01/18 1138        Code Status History    Date Active Date Inactive Code Status Order ID Comments User Context   05/01/2018 1135 05/01/2018 1138 Full Code [id number]  Reubin Milan, MD ED   12/11/2014 1420 12/13/2014 1305 Full Code [id number]  Justice Britain, PA-C Inpatient      Home/SNF/Other Home  Chief Complaint pain in breast  Level of Care/Admitting Diagnosis ED Disposition    ED Disposition Condition [facility] Area: [facility] [[id number]]  Level of Care: Telemetry [5]  Admit to tele based on following criteria: Monitor for Ischemic changes  Diagnosis: Chest pain [035009]  Admitting Physician: Reubin Milan [[id number]]  Attending Physician: Reubin Milan [[id number]]  PT Class (Do Not Modify): Observation [104]  PT Acc Code (Do Not Modify): Observation [10022]       Medical History Past Medical History:  Diagnosis Date  . Acid reflux   . Arthritis   . Asthma   . Hypertension   . Lupus ([place]) 07/13/2011  . 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 Stroke [facility])    lt side weakness    Allergies Allergies  Allergen Reactions  . Iodinated Diagnostic Agents Other (See Comments)    Kidney failure.   . Ciprofloxacin Itching  . E-Mycin [Erythromycin Base] Nausea And Vomiting  . Oxycodone Other (See Comments)    Hallucination.    IV Location/Drains/Wounds Patient Lines/Drains/Airways Status   Active Line/Drains/Airways    Name:   Placement date:   Placement time:   Site:   Days:   Incision (Closed) 12/11/14 Back   12/11/14    1122     1237          Labs/Imaging Results for orders placed or performed during the hospital encounter of 05/01/18 (from the past 48 hour(s))  CBC  with Differential     Status: Abnormal   Collection Time: 05/01/18 10:29 AM  Result Value Ref Range   WBC 5.3 4.0 - 10.5 K/uL   RBC 4.09 3.87 - 5.11 MIL/uL   Hemoglobin 12.2 12.0 - 15.0 g/dL   HCT 40.1 36.0 - 46.0 %   MCV 98.0 80.0 - 100.0 fL   MCH 29.8 26.0 - 34.0 pg   MCHC 30.4 30.0 - 36.0 g/dL   RDW 13.2 11.5 - 15.5 %   Platelets 131 (L) 150 - 400 K/uL   nRBC 0.0 0.0 - 0.2 %   Neutrophils Relative % 54 %   Neutro Abs 2.9 1.7 - 7.7 K/uL   Lymphocytes Relative 34 %   Lymphs Abs 1.8 0.7 - 4.0 K/uL   Monocytes Relative 10 %   Monocytes Absolute 0.5 0.1 - 1.0 K/uL   Eosinophils Relative 1 %   Eosinophils Absolute 0.1 0.0 - 0.5 K/uL   Basophils Relative 1 %   Basophils Absolute 0.0 0.0 - 0.1 K/uL   Immature Granulocytes 0 %   Abs Immature Granulocytes 0.01 0.00 - 0.07 K/uL    Comment: Performed at [facility], [place] [address], [place], [place] [address]  Comprehensive metabolic panel     Status: Abnormal   Collection Time:  05/01/18 10:29 AM  Result Value Ref Range   Sodium 140 135 - 145 mmol/L   Potassium 3.3 (L) 3.5 - 5.1 mmol/L   Chloride 105 98 - 111 mmol/L   CO2 27 22 - 32 mmol/L   Glucose, Bld 113 (H) 70 - 99 mg/dL   BUN 13 8 - 23 mg/dL   Creatinine, Ser 0.90 0.44 - 1.00 mg/dL   Calcium 8.9 8.9 - 10.3 mg/dL   Total Protein 6.5 6.5 - 8.1 g/dL   Albumin 3.5 3.5 - 5.0 g/dL   AST 22 15 - 41 U/L   ALT 19 0 - 44 U/L   Alkaline Phosphatase 95 38 - 126 U/L   Total Bilirubin 0.7 0.3 - 1.2 mg/dL   GFR calc non Af Amer >60 >60 mL/min   GFR calc Af Amer >60 >60 mL/min    Comment: (NOTE) The eGFR has been calculated using the CKD EPI equation. This calculation has not been validated in all clinical situations. eGFR's persistently <60 mL/min signify possible Chronic Kidney Disease.    Anion gap 8 5 - 15    Comment: Performed at [facility], [place] [address], [place], [place] [address]  Troponin I     Status: None   Collection Time:  05/01/18 10:29 AM  Result Value Ref Range   Troponin I <0.03 <0.03 ng/mL    Comment: Performed at [facility], [place] [address], [place], [place] [address]   Dg Chest 2 View  Result Date: 05/01/2018 CLINICAL DATA:  Left breast pain EXAM: CHEST - 2 VIEW COMPARISON:  01/22/2018 FINDINGS: Mild cardiac enlargement without heart failure. Lungs are clear without infiltrate or effusion. IMPRESSION: No active cardiopulmonary disease. Electronically Signed   By: Franchot Gallo M.D.   On: 05/01/2018 10:28   Dg Humerus Left  Result Date: 05/01/2018 CLINICAL DATA:  Left upper arm pain beginning last night. EXAM: LEFT HUMERUS - 2+ VIEW COMPARISON:  None. FINDINGS: Advanced degenerative changes are present at the left shoulder. This predominantly involves the acromion. Calcific tendinopathy is also evident. No acute abnormality is present. The humerus is within normal limits. The visualized clavicle and left lung fields are within normal limits. IMPRESSION: 1. Advanced degenerative changes involving the left acromion. 2. Calcifications at the rotator cuff insertion site. This could be posttraumatic, but most likely represents calcific tendinitis. Electronically Signed   By: San Morelle M.D.   On: 05/01/2018 10:28    Pending Labs Unresulted Labs (From admission, onward)    Start     Ordered   05/02/18 0500  HIV antibody (Routine Testing)  Tomorrow morning,   R     05/01/18 1135   05/01/18 1530  Troponin I (q 6hr x 3)  Now then every 6 hours,   R     05/01/18 1136   05/01/18 1129  Magnesium  Add-on,   R     05/01/18 1128   05/01/18 1129  Phosphorus  Add-on,   R     05/01/18 1128          Vitals/Pain Today's Vitals   05/01/18 1002 05/01/18 1030 05/01/18 1100 05/01/18 1130  BP: (!) 173/85 (!) 194/84 (!) 180/93 (!) 207/87  Pulse: 86 75 70 76  Resp: 14 (!) 21 (!) 21 (!) 25  Temp:      TempSrc:      SpO2: 97% 99% 98% 98%  Weight:      Height:      PainSc:  Isolation Precautions No active isolations  Medications Medications  labetalol (NORMODYNE,TRANDATE) injection 5 mg (has no administration in time range)  potassium chloride SA (K-DUR,KLOR-CON) CR tablet 40 mEq (has no administration in time range)  acetaminophen (TYLENOL) tablet 650 mg (has no administration in time range)  ondansetron (ZOFRAN) injection 4 mg (has no administration in time range)  magnesium sulfate IVPB 2 g 50 mL (has no administration in time range)  enoxaparin (LOVENOX) injection 40 mg (has no administration in time range)    Mobility walks

## 2018-05-01 NOTE — ED Notes (Signed)
Patient transported to X-ray 

## 2018-05-01 NOTE — ED Triage Notes (Signed)
Patient arrived from home. Patient c/o sharp pain shooting through left breast. The pain started 10 pm last night. Pt rates pain 07/10. Pt states they have no pertinent health hx.

## 2018-05-01 NOTE — H&P (Signed)
History and Physical    [name] UTM:[id number] DOB: 26-Feb-1951 DOA: 05/01/2018  PCP: Merrilee Seashore, MD   Patient coming from: Home.  I have personally briefly reviewed patient's old medical records in [place]  Chief Complaint: Left shoulder and chest wall pain.  HPI: [name] is a 67 y.o. female with medical history significant of GERD, osteoarthritis, asthma, SLE, history of lymphoplasmacytoid lymphoma, history of CVA with residual left-sided weakness who is coming to the emergency department with complaints of left shoulder and left-sided chest wall pain since yesterday evening that only was relieved with acetaminophen 1000 mg p.o. for last night, which relieved the pain enough for her to be able to go to sleep.  However, around 2 in the morning she woke up with the same pain and took another thousand milligrams of acetaminophen.  However, it did not relieve the pain as before.  So she subsequently decided to come to the emergency department.  She denies fever, chills, sore throat, dyspnea, diaphoresis, PND, orthopnea, but she gets frequent lower extremity edema.  No abdominal pain, diarrhea, constipation, melena or hematochezia.  Denies dysuria, frequency or hematuria.  No polyuria, polydipsia, polyphagia or blurred vision.  ED Course: Initial vital signs temperature 98.2 F, pulse 83, respiratory dimensions 15, blood pressure 170/95 mmHg and O2 sat 98% on room air.  The patient received 20 mg of labetalol IVP x1.  Her EKG showed new T wave inversions on leads II, III, aVF and V3 through V6 which are new since her last EKG on record.  However, her last EKGs from 2008.  Troponin was normal.CBC was normal except for platelets of 131.  CMP shows a potassium 3.3 mmol/L and glucose of 130 mg/dL.  All other values are within normal limits.  Shoulder x-ray shows advanced anterior changes of the left acromion with calcifications at the rotator cuff insertion site.  Chest  radiograph shows mild cardiac enlargement without heart failure.  Lungs are clear.  See images and radiology report for further detail.  Review of Systems: As per HPI otherwise 10 point review of systems negative.   Past Medical History:  Diagnosis Date  . Acid reflux   . Arthritis   . Asthma   . Hypertension   . Lupus ([place]) 07/13/2011  . 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 Stroke [facility])    lt side weakness    Past Surgical History:  Procedure Laterality Date  . ABDOMINAL HYSTERECTOMY    . BACK SURGERY    . CHOLECYSTECTOMY    . FOOT SURGERY     x2  . MASS EXCISION     back  . rortator     rotator cuff     reports that she quit smoking about 23 years ago. She has a 10.00 pack-year smoking history. She has never used smokeless tobacco. She reports that she drinks alcohol. She reports that she does not use drugs.  Allergies  Allergen Reactions  . Iodinated Diagnostic Agents Other (See Comments)    Kidney failure.   . Ciprofloxacin Itching  . E-Mycin [Erythromycin Base] Nausea And Vomiting  . Oxycodone Other (See Comments)    Hallucination.    Family History  Problem Relation Age of Onset  . Lung disease Father   . Coronary artery disease Brother    Prior to Admission medications   Medication Sig Start Date End Date Taking? Authorizing Provider  aspirin EC 81 MG tablet Take 81 mg by mouth daily.  Yes [provider]  Biotin 1000 MCG tablet Take 1,000 mcg by mouth daily.   Yes [provider]  calcium carbonate (OS-CAL) 600 MG TABS Take 600 mg by mouth daily.   Yes [provider]  cholecalciferol (VITAMIN D) 1000 UNITS tablet Take 1,000 Units by mouth daily.    Yes [provider]  furosemide (LASIX) 80 MG tablet Take 80 mg by mouth daily.   Yes [provider]  hydroxychloroquine (PLAQUENIL) 200 MG tablet Take 400 mg by mouth daily.    Yes [provider]  Hypromellose (ARTIFICIAL TEARS OP)  Place 1 drop into both eyes 4 (four) times daily as needed (for dry eyes).    Yes [provider]  latanoprost (XALATAN) 0.005 % ophthalmic solution Place 1 drop into both eyes at bedtime. 04/13/18  Yes [provider]  losartan (COZAAR) 100 MG tablet Take 100 mg by mouth daily.   Yes [provider]  Multiple Vitamin (MULTIVITAMIN) tablet Take 1 tablet by mouth daily.   Yes [provider]  potassium chloride SA (K-DUR,KLOR-CON) 20 MEQ tablet Take 60 mEq by mouth daily.    Yes [provider]  Vitamin D, Ergocalciferol, (DRISDOL) 50000 units CAPS capsule Take 1 capsule (50,000 Units total) by mouth every 7 (seven) days. Patient not taking: Reported on 05/01/2018 02/04/16   Lyndal Pulley, DO    Physical Exam: Vitals:   05/01/18 1130 05/01/18 1200 05/01/18 1245 05/01/18 1359  BP: (!) 207/87 (!) 186/92 (!) 192/85 (!) 164/78  Pulse: 76 74 71   Resp: (!) 25 (!) 22 20   Temp:   97.9 F (36.6 C)   TempSrc:   Oral   SpO2: 98% 97% 94%   Weight:   95.3 kg   Height:   5\' 6"  (1.676 m)     Constitutional: NAD, calm, comfortable with Eyes: PERRL, lids and conjunctivae are injected, particularly on the right. ENMT: Mucous membranes are moist. Posterior pharynx clear of any exudate or lesions. Neck: normal, supple, no masses, no thyromegaly Respiratory: Clear to auscultation bilaterally, no wheezing, no crackles. Normal respiratory effort. No accessory muscle use.  Cardiovascular: Regular rate and rhythm, no murmurs / rubs / gallops. No extremity edema. 2+ pedal pulses. No carotid bruits.  Abdomen: Obese, soft, no tenderness, no masses palpated. No hepatosplenomegaly. Bowel sounds positive.  Musculoskeletal: no clubbing / cyanosis.  Moderately decreased left shoulder ROM, positive tenderness on lateral left-sided chest wall. Normal muscle tone.  Skin: no rashes, lesions, ulcers  Neurologic: CN 2-12 grossly intact. Sensation intact, DTR normal. Strength  5/5 in all 4.  Psychiatric:  Alert and oriented x 3. Normal mood.   Labs on Admission: I have personally reviewed following labs and imaging studies  CBC: Recent Labs  Lab 05/01/18 1029  WBC 5.3  NEUTROABS 2.9  HGB 12.2  HCT 40.1  MCV 98.0  PLT 937*   Basic Metabolic Panel: Recent Labs  Lab 05/01/18 1029  NA 140  K 3.3*  CL 105  CO2 27  GLUCOSE 113*  BUN 13  CREATININE 0.90  CALCIUM 8.9  MG 1.9  PHOS 2.5   GFR: Estimated Creatinine Clearance: 68.6 mL/min (by C-G formula based on SCr of 0.9 mg/dL). Liver Function Tests: Recent Labs  Lab 05/01/18 1029  AST 22  ALT 19  ALKPHOS 95  BILITOT 0.7  PROT 6.5  ALBUMIN 3.5   No results for input(s): LIPASE, AMYLASE in the last 168 hours. No results for input(s): AMMONIA  in the last 168 hours. Coagulation Profile: No results for input(s): INR, PROTIME in the last 168 hours. Cardiac Enzymes: Recent Labs  Lab 05/01/18 1029 05/01/18 1501  TROPONINI <0.03 <0.03   BNP (last 3 results) No results for input(s): PROBNP in the last 8760 hours. HbA1C: No results for input(s): HGBA1C in the last 72 hours. CBG: No results for input(s): GLUCAP in the last 168 hours. Lipid Profile: No results for input(s): CHOL, HDL, LDLCALC, TRIG, CHOLHDL, LDLDIRECT in the last 72 hours. Thyroid Function Tests: No results for input(s): TSH, T4TOTAL, FREET4, T3FREE, THYROIDAB in the last 72 hours. Anemia Panel: No results for input(s): VITAMINB12, FOLATE, FERRITIN, TIBC, IRON, RETICCTPCT in the last 72 hours. Urine analysis:    Component Value Date/Time   COLORURINE YELLOW 12/04/2014 0949   APPEARANCEUR CLOUDY (A) 12/04/2014 0949   LABSPEC 1.024 12/04/2014 0949   PHURINE 5.5 12/04/2014 0949   GLUCOSEU NEGATIVE 12/04/2014 0949   HGBUR NEGATIVE 12/04/2014 0949   BILIRUBINUR NEGATIVE 12/04/2014 0949   KETONESUR NEGATIVE 12/04/2014 0949   PROTEINUR NEGATIVE 12/04/2014 0949   UROBILINOGEN 0.2 12/04/2014 0949   NITRITE NEGATIVE  12/04/2014 0949   LEUKOCYTESUR SMALL (A) 12/04/2014 0949    Radiological Exams on Admission: Dg Chest 2 View  Result Date: 05/01/2018 CLINICAL DATA:  Left breast pain EXAM: CHEST - 2 VIEW COMPARISON:  01/22/2018 FINDINGS: Mild cardiac enlargement without heart failure. Lungs are clear without infiltrate or effusion. IMPRESSION: No active cardiopulmonary disease. Electronically Signed   By: Franchot Gallo M.D.   On: 05/01/2018 10:28   Dg Humerus Left  Result Date: 05/01/2018 CLINICAL DATA:  Left upper arm pain beginning last night. EXAM: LEFT HUMERUS - 2+ VIEW COMPARISON:  None. FINDINGS: Advanced degenerative changes are present at the left shoulder. This predominantly involves the acromion. Calcific tendinopathy is also evident. No acute abnormality is present. The humerus is within normal limits. The visualized clavicle and left lung fields are within normal limits. IMPRESSION: 1. Advanced degenerative changes involving the left acromion. 2. Calcifications at the rotator cuff insertion site. This could be posttraumatic, but most likely represents calcific tendinitis. Electronically Signed   By: San Morelle M.D.   On: 05/01/2018 10:28    EKG: Independently reviewed.  Vent. rate 71 BPM PR interval * ms QRS duration 75 ms QT/QTc 412/448 ms P-R-T axes 81 64 -88 Sinus rhythm Nonspecific T abnormalities, diffuse leads TWI in II, III, avf and V3-6 all new since 2008  Assessment/Plan Principal Problem:   Chest pain Pain is atypical. The patient also has left shoulder pain. However, her EKG has some T wave changes since the previous one in 2008. She had a recent EKG done at her physician's office, which we are trying to obtain. In the meantime, we will trend troponin levels and keep under telemetry monitoring. Continue daily 81 mg aspirin. Depending on how the most recent EKG compares to the one done today may need to consult cardiology while the patient is in the hospital or may  need to go for an outpatient stress test.  Active Problems:   GERD Protonix 40 mg p.o. daily.    Rheumatoid arthritis (HCC)   Lupus (HCC) Continue Plaquenil. Analgesics as needed. Follow-up with rheumatology as scheduled.    Hypertension Continue furosemide 80 mg p.o. daily. Continue losartan 100 mg p.o. daily. Hydralazine 10 mg every 4 hours as needed for SBP over 159 mmHg per    Asthma Asymptomatic at this time. Supplemental oxygen and bronchodilators as needed  DVT prophylaxis: Heparin SQ. Code Status: Full code. Family Communication: Disposition Plan: Observation for atypical chest pain/new EKG changes. Consults called:  Admission status: Observation/telemetry.   Reubin Milan MD [facility] Pager [phone].  If 7PM-7AM, please contact night-coverage [email or web address] Password [facility]  05/01/2018, 4:39 PM

## 2018-05-01 NOTE — ED Notes (Signed)
ED Provider at bedside. 

## 2018-05-01 NOTE — ED Notes (Signed)
Attempted IV start x 2. This nurse has put in an IV consult.

## 2018-05-01 NOTE — ED Notes (Signed)
Given report to [employer], need to obtain IV access though IV team or ultrasound IV before transport.

## 2018-05-02 ENCOUNTER — Other Ambulatory Visit: Payer: Self-pay | Admitting: Cardiology

## 2018-05-02 DIAGNOSIS — M069 Rheumatoid arthritis, unspecified: Secondary | ICD-10-CM | POA: Diagnosis not present

## 2018-05-02 DIAGNOSIS — I10 Essential (primary) hypertension: Secondary | ICD-10-CM | POA: Diagnosis not present

## 2018-05-02 DIAGNOSIS — J45909 Unspecified asthma, uncomplicated: Secondary | ICD-10-CM | POA: Diagnosis not present

## 2018-05-02 DIAGNOSIS — M19012 Primary osteoarthritis, left shoulder: Secondary | ICD-10-CM | POA: Diagnosis present

## 2018-05-02 DIAGNOSIS — R0789 Other chest pain: Secondary | ICD-10-CM | POA: Diagnosis not present

## 2018-05-02 DIAGNOSIS — M329 Systemic lupus erythematosus, unspecified: Secondary | ICD-10-CM | POA: Diagnosis not present

## 2018-05-02 DIAGNOSIS — K219 Gastro-esophageal reflux disease without esophagitis: Secondary | ICD-10-CM | POA: Diagnosis not present

## 2018-05-02 DIAGNOSIS — I69354 Hemiplegia and hemiparesis following cerebral infarction affecting left non-dominant side: Secondary | ICD-10-CM | POA: Diagnosis not present

## 2018-05-02 DIAGNOSIS — R079 Chest pain, unspecified: Secondary | ICD-10-CM | POA: Diagnosis not present

## 2018-05-02 DIAGNOSIS — N644 Mastodynia: Secondary | ICD-10-CM | POA: Diagnosis not present

## 2018-05-02 DIAGNOSIS — Z8572 Personal history of non-Hodgkin lymphomas: Secondary | ICD-10-CM | POA: Diagnosis not present

## 2018-05-02 LAB — TROPONIN I

## 2018-05-02 LAB — HIV ANTIBODY (ROUTINE TESTING W REFLEX): HIV Screen 4th Generation wRfx: NONREACTIVE

## 2018-05-02 MED ORDER — FAMOTIDINE 20 MG PO TABS
20.0000 mg | ORAL_TABLET | Freq: Two times a day (BID) | ORAL | Status: DC
  Administered 2018-05-02 – 2018-05-03 (×3): 20 mg via ORAL
  Filled 2018-05-02 (×3): qty 1

## 2018-05-02 MED ORDER — POTASSIUM CHLORIDE CRYS ER 20 MEQ PO TBCR: 60.0000 meq | EXTENDED_RELEASE_TABLET | Freq: Every day | ORAL | Status: DC

## 2018-05-02 MED ORDER — POTASSIUM CHLORIDE CRYS ER 20 MEQ PO TBCR
40.0000 meq | EXTENDED_RELEASE_TABLET | Freq: Once | ORAL | Status: AC
  Administered 2018-05-02: 40 meq via ORAL
  Filled 2018-05-02: qty 2

## 2018-05-02 MED ORDER — KETOROLAC TROMETHAMINE 30 MG/ML IJ SOLN
30.0000 mg | Freq: Once | INTRAMUSCULAR | Status: AC
  Administered 2018-05-02: 30 mg via INTRAVENOUS
  Filled 2018-05-02: qty 1

## 2018-05-02 MED ORDER — MAGNESIUM SULFATE 2 GM/50ML IV SOLN
2.0000 g | Freq: Once | INTRAVENOUS | Status: AC
  Administered 2018-05-02: 2 g via INTRAVENOUS
  Filled 2018-05-02: qty 50

## 2018-05-02 MED ORDER — SODIUM CHLORIDE 0.9 % IV SOLN
INTRAVENOUS | Status: DC | PRN
  Administered 2018-05-02: 250 mL via INTRAVENOUS

## 2018-05-02 MED ORDER — METOPROLOL SUCCINATE ER 25 MG PO TB24
25.0000 mg | ORAL_TABLET | Freq: Every day | ORAL | Status: DC
  Administered 2018-05-02 – 2018-05-03 (×2): 25 mg via ORAL
  Filled 2018-05-02 (×2): qty 1

## 2018-05-02 MED ORDER — POTASSIUM CHLORIDE CRYS ER 20 MEQ PO TBCR
40.0000 meq | EXTENDED_RELEASE_TABLET | Freq: Two times a day (BID) | ORAL | Status: DC
  Administered 2018-05-02 – 2018-05-03 (×2): 40 meq via ORAL
  Filled 2018-05-02 (×2): qty 2

## 2018-05-02 NOTE — Progress Notes (Signed)
05/02/18 1100  Received EKG from patient's PCP. EKG placed on chart. MD notified.

## 2018-05-02 NOTE — Care Management Obs Status (Signed)
[place] NOTIFICATION   Patient Details  Name: ADRIYANA GREENBAUM MRN: [id number] Date of Birth: 07/04/1951   Medicare Observation Status Notification Given:  Yes    MahabirJuliann Pulse, RN 05/02/2018, 4:15 PM

## 2018-05-02 NOTE — Progress Notes (Signed)
PROGRESS NOTE    [name]  GBT:[id number] DOB: 1951-01-25 DOA: 05/01/2018 PCP: Merrilee Seashore, MD   Brief Narrative: Chief Complaint: Left shoulder and chest wall pain.  HPI: [name] is a 67 y.o. female with medical history significant of GERD, osteoarthritis, asthma, SLE, history of lymphoplasmacytoid lymphoma, history of CVA with residual left-sided weakness who is coming to the emergency department with complaints of left shoulder and left-sided chest wall pain since yesterday evening that only was relieved with acetaminophen 1000 mg p.o. for last night, which relieved the pain enough for her to be able to go to sleep.  However, around 2 in the morning she woke up with the same pain and took another thousand milligrams of acetaminophen.  However, it did not relieve the pain as before.  So she subsequently decided to come to the emergency department.  She denies fever, chills, sore throat, dyspnea, diaphoresis, PND, orthopnea, but she gets frequent lower extremity edema.  No abdominal pain, diarrhea, constipation, melena or hematochezia.  Denies dysuria, frequency or hematuria.  No polyuria, polydipsia, polyphagia or blurred vision.  ED Course: Initial vital signs temperature 98.2 F, pulse 83, respiratory dimensions 15, blood pressure 170/95 mmHg and O2 sat 98% on room air.  The patient received 20 mg of labetalol IVP x1.  Her EKG showed new T wave inversions on leads II, III, aVF and V3 through V6 which are new since her last EKG on record.  However, her last EKGs from 2008.  Troponin was normal.CBC was normal except for platelets of 131.  CMP shows a potassium 3.3 mmol/L and glucose of 130 mg/dL.  All other values are within normal limits.  Shoulder x-ray shows advanced anterior changes of the left acromion with calcifications at the rotator cuff insertion site.  Chest radiograph shows mild cardiac enlargement without heart failure.  Lungs are clear.  See images and radiology  report for further detail.  Assessment & Plan:   Principal Problem:   Chest pain Pain is atypical. Troponin x3 has been negative. However, multiple risk factors for CAD. EKG continues to show T wave inversions, which is changed from 2008 EKG. We are trying to obtain the EKG from her PCP down about a month ago. The patient is having unexplained episodes of sudden, dizziness, nausea and emesis. Echo done 3 months ago show an EF of 60 to 65% with grade 2 diastolic dysfunction and mild MR. Cardiology has been consulted.  I have told the patient that she will at least need an outpatient stress test, particularly if she is interested to have shoulder surgery.  Addendum: 11:49  EKG from PCPs office received via fax and shows same T wave inversions that we are seeing now, set that EKG is low voltage compared to a recent one.  Briefly discussed with cardiology, patient will probably need an outpatient stress test.  May be low-dose beta-blocker and aspirin.  Active Problems:   Arthrosis of left shoulder Continue current management. The patient is unable to tolerate narcotics. This most likely will need surgical intervention in the future.    GERD Continue Protonix 40 mg p.o. daily.    Rheumatoid arthritis (HCC)   Lupus (HCC) Continue Plaquenil. Analgesics as needed. Follow-up with rheumatology as scheduled.    Hypertension Continue furosemide 80 mg p.o. Daily. Increase potassium to 40 mEq twice daily. Continue losartan 100 mg p.o. Daily. Hydralazine 10 mg every 4 hours as needed for SBP over 159 mmHg per    Asthma Asymptomatic  at this time. Supplemental oxygen and bronchodilators as needed    DVT prophylaxis: Lovenox SQ. Code Status: Full code. Family Communication: Her husband was present in the room. Disposition Plan: Shoulder pain under control.  Cardiology consult requested due to dizziness, nausea and emesis with EKG changes (specifically T wave inversions on lateral  leads).   Consultants:   Cardiology was consulted.   Procedures: None   Antimicrobials: None   Subjective: Today 05/02/2018  The patient's shoulder pain has improved significantly.  She had a bowel movement yesterday.  However, last evening the patient had an episode of lightheadedness associated with nausea and threw up.  She felt a little flushed, but denies any abdominal pain, suprasternal burning sensation, diaphoresis, palpitations.  She now remembers that she had a similar episode at home about 2 weeks ago.   Objective: Vitals:   05/01/18 1726 05/01/18 2359 05/02/18 0404 05/02/18 0959  BP: (!) 162/74 (!) 126/59 (!) 149/60 (!) 160/75  Pulse:  71 64 79  Resp:  16 18 20   Temp:  98.1 F (36.7 C) 98 F (36.7 C) 97.7 F (36.5 C)  TempSrc:  Oral Oral Oral  SpO2:  98% 97% 100%  Weight:      Height:        Intake/Output Summary (Last 24 hours) at 05/02/2018 1047 Last data filed at 05/02/2018 0933 Gross per 24 hour  Intake 240 ml  Output 1950 ml  Net -1710 ml   Filed Weights   05/01/18 0911 05/01/18 1245  Weight: 95.3 kg 95.3 kg    Examination:  General exam: Appears calm and comfortable  Respiratory system: Clear to auscultation. Respiratory effort normal. Cardiovascular system: S1 & S2 heard, RRR. No JVD, murmurs, rubs, gallops or clicks. No pedal edema. Stage I lymphedema. Gastrointestinal system: Abdomen is nondistended, soft and nontender. No organomegaly or masses felt. Normal bowel sounds heard. Central nervous system: Alert and oriented. No focal neurological deficits. Extremities: Symmetric 5 x 5 power.  Significantly decreased pain and increased range of motion of left shoulder. Skin: No rashes, lesions or ulcers Psychiatry: Judgement and insight appear normal. Mood & affect appropriate.   Data Reviewed: I have personally reviewed following labs and imaging studies  CBC: Recent Labs  Lab 05/01/18 1029  WBC 5.3  NEUTROABS 2.9  HGB 12.2  HCT 40.1   MCV 98.0  PLT 030*   Basic Metabolic Panel: Recent Labs  Lab 05/01/18 1029  NA 140  K 3.3*  CL 105  CO2 27  GLUCOSE 113*  BUN 13  CREATININE 0.90  CALCIUM 8.9  MG 1.9  PHOS 2.5   GFR: Estimated Creatinine Clearance: 68.6 mL/min (by C-G formula based on SCr of 0.9 mg/dL). Liver Function Tests: Recent Labs  Lab 05/01/18 1029  AST 22  ALT 19  ALKPHOS 95  BILITOT 0.7  PROT 6.5  ALBUMIN 3.5   No results for input(s): LIPASE, AMYLASE in the last 168 hours. No results for input(s): AMMONIA in the last 168 hours. Coagulation Profile: No results for input(s): INR, PROTIME in the last 168 hours. Cardiac Enzymes: Recent Labs  Lab 05/01/18 1029 05/01/18 1501 05/01/18 2140 05/02/18 0306  TROPONINI <0.03 <0.03 <0.03 <0.03   BNP (last 3 results) No results for input(s): PROBNP in the last 8760 hours. HbA1C: No results for input(s): HGBA1C in the last 72 hours. CBG: No results for input(s): GLUCAP in the last 168 hours. Lipid Profile: No results for input(s): CHOL, HDL, LDLCALC, TRIG, CHOLHDL, LDLDIRECT in the last  72 hours. Thyroid Function Tests: No results for input(s): TSH, T4TOTAL, FREET4, T3FREE, THYROIDAB in the last 72 hours. Anemia Panel: No results for input(s): VITAMINB12, FOLATE, FERRITIN, TIBC, IRON, RETICCTPCT in the last 72 hours. Sepsis Labs: No results for input(s): PROCALCITON, LATICACIDVEN in the last 168 hours.  No results found for this or any previous visit (from the past 240 hour(s)).   Radiology Studies: Dg Chest 2 View  Result Date: 05/01/2018 CLINICAL DATA:  Left breast pain EXAM: CHEST - 2 VIEW COMPARISON:  01/22/2018 FINDINGS: Mild cardiac enlargement without heart failure. Lungs are clear without infiltrate or effusion. IMPRESSION: No active cardiopulmonary disease. Electronically Signed   By: Franchot Gallo M.D.   On: 05/01/2018 10:28   Dg Humerus Left  Result Date: 05/01/2018 CLINICAL DATA:  Left upper arm pain beginning last  night. EXAM: LEFT HUMERUS - 2+ VIEW COMPARISON:  None. FINDINGS: Advanced degenerative changes are present at the left shoulder. This predominantly involves the acromion. Calcific tendinopathy is also evident. No acute abnormality is present. The humerus is within normal limits. The visualized clavicle and left lung fields are within normal limits. IMPRESSION: 1. Advanced degenerative changes involving the left acromion. 2. Calcifications at the rotator cuff insertion site. This could be posttraumatic, but most likely represents calcific tendinitis. Electronically Signed   By: San Morelle M.D.   On: 05/01/2018 10:28    Scheduled Meds: . aspirin EC  81 mg Oral Daily  . calcium carbonate  1 tablet Oral Q breakfast  . cholecalciferol  1,000 Units Oral Daily  . enoxaparin (LOVENOX) injection  40 mg Subcutaneous Daily  . famotidine  20 mg Oral BID  . furosemide  80 mg Oral Daily  . hydroxychloroquine  400 mg Oral Daily  . ketorolac  30 mg Intravenous Once  . latanoprost  1 drop Both Eyes QHS  . losartan  100 mg Oral Daily  . multivitamin with minerals  1 tablet Oral Daily  . potassium chloride SA  40 mEq Oral BID  . potassium chloride  40 mEq Oral Once   Continuous Infusions: . sodium chloride 250 mL (05/02/18 0944)    LOS: 1 days   Time spent: About 35 minutes were spent.  Reubin Milan, MD [facility] Pager [phone].  If 7PM-7AM, please contact night-coverage [email or web address] Password [email or web address] 05/02/2018, 10:47 AM

## 2018-05-02 NOTE — Plan of Care (Signed)
  Problem: Nutrition: Goal: Adequate nutrition will be maintained Outcome: Progressing   Problem: Elimination: Goal: Will not experience complications related to bowel motility Outcome: Progressing   Problem: Pain Managment: Goal: General experience of comfort will improve Outcome: Progressing   

## 2018-05-02 NOTE — Consult Note (Signed)
Cardiology Consultation:   Patient ID: [name] MRN: [id number]; DOB: Aug 06, 1950  Admit date: 05/01/2018 Date of Consult: 05/02/2018  Primary Care Provider: Merrilee Seashore, MD Primary Cardiologist: Skeet Latch, MD - New Primary Electrophysiologist:  None    Patient Profile:   [name] is a 67 y.o. female with a hx of hypertension, GERD, osteoarthritis, asthma, SLE, history of lympho-plasmacytoid lymphoma, history of CVA with left-sided residual weakness who is being seen today for the evaluation of chest pain at the request of Dr. Olevia Bowens.  History of Present Illness:   [name] was in her usual state of health until Monday evening before bed she developed a sharp shooting pain in the left breast, lasting for a few seconds at a time and reoccurring often.  She took 2 extra strength Tylenol 10:30 PM without relief.  She took 2 more Tylenol around 2 AM with seemingly no relief but she was able to fall off to sleep around 330.  She awoke again at 6 AM with continued pain.  She had no associated shortness of breath, palpitations, lightheadedness, nausea.  She presented to the hospital yesterday for evaluation.  The same sharp stabbing pains continued intermittently through the day.  At some point last night she did develop some lightheadedness, nausea and she gagged and vomited x1.  She was given some Tylenol and Flexeril last night that finally relieved her discomfort and is currently not returned.  She reports that she develops pedal edema shortly after rising morning, lasts all day then resolves overnight.  She is on Lasix 80 mg daily for this.  She has no orthopnea or PND.  [name] denies any prior MI or cardiac issues.  She is mostly sedentary, only doing some light housework occasionally.  She does not exercise.  She denies any exertional chest discomfort with what she does.  She does note shortness of breath with minimal exertion.  She also notes feeling a rare quick  fluttering her chest which is never sustained.  She does not drink any alcohol.  She has a remote smoking history having quit greater than 20 years ago.  Past Medical History:  Diagnosis Date  . Acid reflux   . Arthritis   . Asthma   . Hypertension   . Lupus ([place]) 07/13/2011  . 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 Stroke [facility])    lt side weakness    Past Surgical History:  Procedure Laterality Date  . ABDOMINAL HYSTERECTOMY    . BACK SURGERY    . CHOLECYSTECTOMY    . FOOT SURGERY     x2  . MASS EXCISION     back  . rortator     rotator cuff     Home Medications:  Prior to Admission medications   Medication Sig Start Date End Date Taking? Authorizing Provider  aspirin EC 81 MG tablet Take 81 mg by mouth daily.   Yes [provider]  Biotin 1000 MCG tablet Take 1,000 mcg by mouth daily.   Yes [provider]  calcium carbonate (OS-CAL) 600 MG TABS Take 600 mg by mouth daily.   Yes [provider]  cholecalciferol (VITAMIN D) 1000 UNITS tablet Take 1,000 Units by mouth daily.    Yes [provider]  furosemide (LASIX) 80 MG tablet Take 80 mg by mouth daily.   Yes [provider]  hydroxychloroquine (PLAQUENIL) 200 MG tablet Take 400 mg by mouth daily.    Yes [provider]  Hypromellose (ARTIFICIAL TEARS OP) Place 1 drop into both eyes 4 (four) times daily as needed (for dry eyes).    Yes [provider]  latanoprost (XALATAN) 0.005 % ophthalmic solution Place 1 drop into both eyes at bedtime. 04/13/18  Yes [provider]  losartan (COZAAR) 100 MG tablet Take 100 mg by mouth daily.   Yes [provider]  Multiple Vitamin (MULTIVITAMIN) tablet Take 1 tablet by mouth daily.   Yes [provider]  potassium chloride SA (K-DUR,KLOR-CON) 20 MEQ tablet Take 60 mEq by mouth daily.    Yes [provider]  Vitamin D, Ergocalciferol, (DRISDOL) 50000 units CAPS capsule  Take 1 capsule (50,000 Units total) by mouth every 7 (seven) days. Patient not taking: Reported on 05/01/2018 02/04/16   Lyndal Pulley, DO    Inpatient Medications: Scheduled Meds: . aspirin EC  81 mg Oral Daily  . calcium carbonate  1 tablet Oral Q breakfast  . cholecalciferol  1,000 Units Oral Daily  . enoxaparin (LOVENOX) injection  40 mg Subcutaneous Daily  . famotidine  20 mg Oral BID  . furosemide  80 mg Oral Daily  . hydroxychloroquine  400 mg Oral Daily  . latanoprost  1 drop Both Eyes QHS  . losartan  100 mg Oral Daily  . multivitamin with minerals  1 tablet Oral Daily  . potassium chloride SA  40 mEq Oral BID   Continuous Infusions: . sodium chloride 250 mL (05/02/18 0944)   PRN Meds: sodium chloride, acetaminophen, cyclobenzaprine, hydrALAZINE, nitroGLYCERIN, ondansetron (ZOFRAN) IV, polyvinyl alcohol  Allergies:    Allergies  Allergen Reactions  . Iodinated Diagnostic Agents Other (See Comments)    Kidney failure.   . Ciprofloxacin Itching  . E-Mycin [Erythromycin Base] Nausea And Vomiting  . Oxycodone Other (See Comments)    Hallucination.    Social History:   Social History   Socioeconomic History  . Marital status: Married    Spouse name: Not on file  . Number of children: Not on file  . Years of education: Not on file  . Highest education level: Not on file  Occupational History  . Not on file  Social Needs  . Financial resource strain: Not on file  . Food insecurity:    Worry: Not on file    Inability: Not on file  . Transportation needs:    Medical: Not on file    Non-medical: Not on file  Tobacco Use  . Smoking status: Former Smoker    Packs/day: 1.00    Years: 10.00    Pack years: 10.00    Last attempt to quit: 06/17/1994    Years since quitting: 23.8  . Smokeless tobacco: Never Used  Substance and Sexual Activity  . Alcohol use: Yes    Alcohol/week: 0.0 standard drinks  . Drug use: No  . Sexual activity: Not Currently  Lifestyle   . Physical activity:    Days per week: Not on file    Minutes per session: Not on file  . Stress: Not on file  Relationships  . Social connections:    Talks on phone: Not on file    Gets together: Not on file    Attends religious service: Not on file    Active member of club or organization: Not on file    Attends meetings of clubs or organizations: Not on file    Relationship status: Not on file  . Intimate partner violence:    Fear of current or ex  partner: Not on file    Emotionally abused: Not on file    Physically abused: Not on file    Forced sexual activity: Not on file  Other Topics Concern  . Not on file  Social History Narrative  . Not on file    Family History:    Family History  Problem Relation Age of Onset  . Lung disease Father   . Coronary artery disease Brother      ROS:  Please see the history of present illness.   All other ROS reviewed and negative.     Physical Exam/Data:   Vitals:   05/01/18 1726 05/01/18 2359 05/02/18 0404 05/02/18 0959  BP: (!) 162/74 (!) 126/59 (!) 149/60 (!) 160/75  Pulse:  71 64 79  Resp:  16 18 20   Temp:  98.1 F (36.7 C) 98 F (36.7 C) 97.7 F (36.5 C)  TempSrc:  Oral Oral Oral  SpO2:  98% 97% 100%  Weight:      Height:        Intake/Output Summary (Last 24 hours) at 05/02/2018 1155 Last data filed at 05/02/2018 0933 Gross per 24 hour  Intake 240 ml  Output 1950 ml  Net -1710 ml   Filed Weights   05/01/18 0911 05/01/18 1245  Weight: 95.3 kg 95.3 kg   Body mass index is 33.89 kg/m.  General: Obese female, in no acute distress HEENT: normal Lymph: no adenopathy Neck: no JVD Endocrine:  No thryomegaly Vascular: No carotid bruits; FA pulses 2+ bilaterally without bruits  Cardiac:  normal S1, S2; RRR; no murmur  Lungs:  clear to auscultation bilaterally, no wheezing, rhonchi or rales  Abd: soft, nontender, no hepatomegaly  Ext: no edema Musculoskeletal:  No deformities, BUE and BLE strength normal  and equal Skin: warm and dry  Neuro:  CNs 2-12 intact, no focal abnormalities noted Psych:  Normal affect   EKG:  The EKG was personally reviewed and demonstrates: Normal sinus rhythm with mild T wave inversions in V3-6 and leads II and aVF Telemetry:  Telemetry was personally reviewed and demonstrates: Sinus rhythm 60s-70s  Relevant CV Studies:  Echocardiogram 01/29/2018 Study Conclusions - Left ventricle: The cavity size was normal. Wall thickness was   normal. Systolic function was normal. The estimated ejection   fraction was in the range of 60% to 65%. Wall motion was normal;   there were no regional wall motion abnormalities. Features are   consistent with a pseudonormal left ventricular filling pattern,   with concomitant abnormal relaxation and increased filling   pressure (grade 2 diastolic dysfunction). - Mitral valve: Calcified annulus. There was mild regurgitation. - Pericardium, extracardiac: A trivial pericardial effusion was   identified.  Impressions: - Normal LV systolic function; moderate diastolic dysfunction;   mild MR.  Laboratory Data:  Chemistry Recent Labs  Lab 05/01/18 1029  NA 140  K 3.3*  CL 105  CO2 27  GLUCOSE 113*  BUN 13  CREATININE 0.90  CALCIUM 8.9  GFRNONAA >60  GFRAA >60  ANIONGAP 8    Recent Labs  Lab 05/01/18 1029  PROT 6.5  ALBUMIN 3.5  AST 22  ALT 19  ALKPHOS 95  BILITOT 0.7   Hematology Recent Labs  Lab 05/01/18 1029  WBC 5.3  RBC 4.09  HGB 12.2  HCT 40.1  MCV 98.0  MCH 29.8  MCHC 30.4  RDW 13.2  PLT 131*   Cardiac Enzymes Recent Labs  Lab 05/01/18 1029 05/01/18 1501 05/01/18 2140 05/02/18  0306  TROPONINI <0.03 <0.03 <0.03 <0.03   No results for input(s): TROPIPOC in the last 168 hours.  BNPNo results for input(s): BNP, PROBNP in the last 168 hours.  DDimer No results for input(s): DDIMER in the last 168 hours.  Radiology/Studies:  Dg Chest 2 View  Result Date: 05/01/2018 CLINICAL DATA:  Left  breast pain EXAM: CHEST - 2 VIEW COMPARISON:  01/22/2018 FINDINGS: Mild cardiac enlargement without heart failure. Lungs are clear without infiltrate or effusion. IMPRESSION: No active cardiopulmonary disease. Electronically Signed   By: Franchot Gallo M.D.   On: 05/01/2018 10:28   Dg Humerus Left  Result Date: 05/01/2018 CLINICAL DATA:  Left upper arm pain beginning last night. EXAM: LEFT HUMERUS - 2+ VIEW COMPARISON:  None. FINDINGS: Advanced degenerative changes are present at the left shoulder. This predominantly involves the acromion. Calcific tendinopathy is also evident. No acute abnormality is present. The humerus is within normal limits. The visualized clavicle and left lung fields are within normal limits. IMPRESSION: 1. Advanced degenerative changes involving the left acromion. 2. Calcifications at the rotator cuff insertion site. This could be posttraumatic, but most likely represents calcific tendinitis. Electronically Signed   By: San Morelle M.D.   On: 05/01/2018 10:28    Assessment and Plan:   1.  Chest pain -Patient presented with atypical sharp shooting pains in her left breast with no significant associated symptoms.  In her daily activities she has no exertional chest discomfort however she does have mild dyspnea on exertion. -EKG with new T wave inversions in V3-6 and leads II and aVF, similar to an EKG done in July (on shadow chart).  Evaluated by her PCP with an echocardiogram which showed normal LV systolic function with EF 60-65%, no regional wall motion abnormalities, grade 2 diastolic dysfunction. -Troponins have been negative x4 -Serum potassium was 3.3.  Renal function is normal with creatinine of 0.90.  Hemoglobin normal at 12.2.  Platelets low at 131 -The patient has mildly abnormal EKG, however no change since July.  Negative troponins and very atypical symptoms.  Her CVD risk factors include obesity, hypertension, age.  I feel thaqt we could allow her to be  discharged and follow-up with outpatient stress test at our office.  I will discuss with Dr. Oval Linsey.  2.  Hypertension -Home medications include losartan 100 mg daily and potassium 80 mg daily which have been continued and patient -Blood pressures are elevated 140s- 180s over 70s-90s -Blood pressure at last PCP office visit on 03/14/2018 was 132/84 -Per PCP her BP may be elevated due to pain.  -Continue current therapy and follow up as outpatient.   3.  Obesity -Body mass index is 33.89 kg/m.  -Advised weight loss through diet with calorie restriction and exercise   For questions or updates, please contact [place] Please consult [email or web address] for contact info under     Signed, Daune Perch, NP  05/02/2018 11:55 AM

## 2018-05-02 NOTE — Discharge Instructions (Signed)
° °  You have a Stress Test scheduled at [facility]. Your doctor has ordered this test to check the blood flow in your heart arteries. ° °Please arrive 15 minutes early for paperwork. The whole test will take several hours. You may want to bring reading material to remain occupied while undergoing different parts of the test. ° °Instructions: °· No food/drink after midnight the night before. °· It is OK to take your morning meds with a sip of water EXCEPT for those types of medicines listed below or otherwise instructed. °· No caffeine/decaf products 24 hours before, including medicines such as Excedrin or Goody Powders. Call if there are any questions.  °· Wear comfortable clothes and shoes.  ° °Special Medication Instructions: °· Beta blockers such as metoprolol (Lopressor/Toprol XL), atenolol (Tenormin), carvedilol (Coreg), nebivolol (Bystolic), bisoprolol (Zebeta), propranolol (Inderal) should not be taken for 24 hours before the test. °· Calcium channel blockers such as diltiazem (Cardizem) or verapmil (Calan) should not be taken for 24 hours before the test. °· Remove nitroglycerin patches and do not take nitrate preparations such as Imdur/isosorbide the day of your test. °· No Persantine/Theophylline or Aggrenox medicines should be used within 24 hours of the test.  °· If you are diabetic, please ask which medications to hold the day of the test ° °What To Expect: °When you arrive in the lab, the technician will inject a small amount of radioactive tracer into your arm through an IV while you are resting quietly. This helps us to form pictures of your heart. You will likely only feel a sting from the IV. After a waiting period, resting pictures will be obtained under a big camera. These are the "before" pictures. ° °Next, you will be prepped for the stress portion of the test. This may include either walking on a treadmill or receiving a medicine that helps to dilate blood vessels in  your heart to simulate the effect of exercise on your heart. If you are walking on a treadmill, you will walk at different paces to try to get your heart rate to a goal number that is based on your age. If your doctor has chosen the pharmacologic test, then you will receive a medicine through your IV that may cause temporary nausea, flushing, shortness of breath and sometimes chest discomfort or vomiting. This is typically short-lived and usually resolves quickly. If you experience symptoms, that does not automatically mean the test is abnormal. Some patients do not experience any symptoms at all. Your blood pressure and heart rate will be monitored, and we will be watching your EKG on a computer screen for any changes. During this portion of the test, the radiologist will inject another small amount of radioactive tracer into your IV. After a waiting period, you will undergo a second set of pictures. These are the "after" pictures. ° °The doctor reading the test will compare the before-and-after images to look for evidence of heart blockages or heart weakness. The test usually takes 1 day to complete, but in certain instances (for example, if a patient is over a certain weight limit), the test may be done over the span of 2 days. ° ° °

## 2018-05-03 ENCOUNTER — Telehealth (HOSPITAL_COMMUNITY): Payer: Self-pay | Admitting: *Deleted

## 2018-05-03 DIAGNOSIS — R079 Chest pain, unspecified: Secondary | ICD-10-CM | POA: Diagnosis not present

## 2018-05-03 LAB — BASIC METABOLIC PANEL
Anion gap: 7 (ref 5–15)
BUN: 12 mg/dL (ref 8–23)
CHLORIDE: 109 mmol/L (ref 98–111)
CO2: 24 mmol/L (ref 22–32)
CREATININE: 0.99 mg/dL (ref 0.44–1.00)
Calcium: 8.5 mg/dL — ABNORMAL LOW (ref 8.9–10.3)
GFR calc Af Amer: >60 mL/min (ref >60)
GFR calc non Af Amer: 57 mL/min — ABNORMAL LOW (ref >60)
Glucose, Bld: 110 mg/dL — ABNORMAL HIGH (ref 70–99)
Potassium: 4 mmol/L (ref 3.5–5.1)
SODIUM: 140 mmol/L (ref 135–145)

## 2018-05-03 MED ORDER — POTASSIUM CHLORIDE CRYS ER 20 MEQ PO TBCR: 40.0000 meq | EXTENDED_RELEASE_TABLET | Freq: Two times a day (BID) | ORAL | 0 refills | Status: DC

## 2018-05-03 MED ORDER — POTASSIUM CHLORIDE CRYS ER 20 MEQ PO TBCR: 40.0000 meq | EXTENDED_RELEASE_TABLET | Freq: Two times a day (BID) | ORAL | Status: DC

## 2018-05-03 MED ORDER — METOPROLOL TARTRATE 25 MG PO TABS: 12.5000 mg | ORAL_TABLET | Freq: Two times a day (BID) | ORAL | 0 refills | Status: DC

## 2018-05-03 NOTE — Telephone Encounter (Signed)
Patient given detailed instructions per Myocardial Perfusion Study Information Sheet for the test on 07/17/17 Patient notified to arrive 15 minutes early and that it is imperative to arrive on time for appointment to keep from having the test rescheduled.  If you need to cancel or reschedule your appointment, please call the office within 24 hours of your appointment. . Patient verbalized understanding. Lenice Koper Jacqueline    

## 2018-05-03 NOTE — Discharge Summary (Signed)
Physician Discharge Summary  [name] ERX:[id number] DOB: 26-Aug-1950 DOA: 05/01/2018  PCP: Merrilee Seashore, MD  Admit date: 05/01/2018 Discharge date: 05/03/2018  Time spent: 35 minutes  Recommendations for Outpatient Follow-up:  1. Follow up with PCP to check fasting blood glucose.  2. Do not drink coffee or take metoprolol on the day of stress test. 3. Make your health your number one priority.   Discharge Diagnoses:  Principal Problem:   Chest pain The pain was atypical, but EKG showing significant ischemic changes. Cardiology evaluated and scheduled for stress test. Continue aspirin. Started metoprolol succinate 25 mg p.o. daily. However, formulary rx is metoprolol tartrate, so dosage is 12.5 mg p.o. twice daily.  Active Problems:   GERD Continue lifestyle modifications. Antiacid as needed. OTC famotidine 20 mg p.o. twice daily PRN.    Rheumatoid arthritis (HCC)   Lupus (HCC) Continue Plaquenil. Analgesics as needed. Rheumatology follow-up as scheduled.    Hypertension Continue furosemide 80 mg p.o. Daily. Increase potassium to 40 mEq twice daily. Continue losartan 100 mg p.o. Daily. Start metoprolol tartrate 12.5 mg p.o. twice daily.    Asthma No symptoms at this time.    Arthrosis of left shoulder Analgesics as needed. She will follow-up with orthopedic surgery as this may need surgical intervention in the future for resolution.  Discharge Condition: Good.  Diet recommendation: Heart healthy.  Filed Weights   05/01/18 0911 05/01/18 1245  Weight: 95.3 kg 95.3 kg   Chief Complaint: Left shoulder and chest wall pain.  History of present illness: [name] a 67 y.o.femalewith medical history significant of GERD, osteoarthritis, asthma, SLE, history of lymphoplasmacytoid lymphoma, history of CVA with residual left-sided weakness who is coming to the emergency department with complaints of left shoulder and left-sided chest wall pain  since the previous day evening that only was relieved with acetaminophen 1000 mg p.o. for last night, which relieved the pain enough for her to be able to go to sleep. However, around 2 in the morning on Tuesday morning, she woke up with the same pain and took another 1000 mg of acetaminophen. However, it did not relieve the pain as before. So she subsequently decided to come to the emergency department. She denies fever, chills, sore throat, dyspnea, diaphoresis, PND, orthopnea, but she gets frequent lower extremity edema. No abdominal pain, diarrhea, constipation, melena or hematochezia. Denies dysuria, frequency or hematuria. No polyuria, polydipsia, polyphagia or blurred vision.  ED Course:Initial vital signs temperature 98.2 F, pulse 83, respiratory dimensions 15, blood pressure 170/95 mmHg and O2 sat 98% on room air. The patient received 20 mg of labetalol IVP x1.  Her EKG showed new T wave inversions on leads II, III, aVF and V3 through V6 which are new since her last EKG on record. However, her last EKGs from 2008. Troponin was normal.CBC was normal except for platelets of 131. CMP shows a potassium 3.3 mmol/L and glucose of 130 mg/dL. All other values are within normal limits. Shoulder x-ray shows advanced anterior changes of the left acromion with calcifications at the rotator cuff insertion site. Chest radiograph shows mild cardiac enlargement without heart failure. Lungs are clear. See images and radiology report for further detail.   Hospital Course:  The patient was given analgesics to manage her shoulder pain.  This improved significantly.  She was placed on telemetry monitoring and troponin level was trended.  Cardiology was consulted due to EKG changes.  She will be having an outpatient stress test in the near future.  Potassium was increased to 40 mEq twice daily on discharge and she was given a prescription for metoprolol 12.5 mg p.o. twice  daily.  Procedures:  None.  Consultations:  Cardiology.  Discharge Exam: Vitals:   05/02/18 2201 05/03/18 0514  BP: (!) 156/82 (!) 153/67  Pulse: 69 62  Resp: 18 20  Temp: 98.1 F (36.7 C) 98.1 F (36.7 C)  SpO2: 100% 96%   General: NAD, afebrile. Cardiovascular: S1-S2, RRR Respiratory: Clear to auscultation.   Gastrointestinal:Soft, nontender  Discharge Instructions   Allergies as of 05/03/2018      Reactions   Iodinated Diagnostic Agents Other (See Comments)   Kidney failure.    Ciprofloxacin Itching   E-mycin [erythromycin Base] Nausea And Vomiting   Oxycodone Other (See Comments)   Hallucination.      Medication List    TAKE these medications   ARTIFICIAL TEARS OP Place 1 drop into both eyes 4 (four) times daily as needed (for dry eyes).   aspirin EC 81 MG tablet Take 81 mg by mouth daily.   Biotin 1000 MCG tablet Take 1,000 mcg by mouth daily.   calcium carbonate 600 MG Tabs tablet Commonly known as:  OS-CAL Take 600 mg by mouth daily.   cholecalciferol 1000 units tablet Commonly known as:  VITAMIN D Take 1,000 Units by mouth daily.   furosemide 80 MG tablet Commonly known as:  LASIX Take 80 mg by mouth daily.   hydroxychloroquine 200 MG tablet Commonly known as:  PLAQUENIL Take 400 mg by mouth daily.   latanoprost 0.005 % ophthalmic solution Commonly known as:  XALATAN Place 1 drop into both eyes at bedtime.   losartan 100 MG tablet Commonly known as:  COZAAR Take 100 mg by mouth daily.   metoprolol tartrate 25 MG tablet Commonly known as:  LOPRESSOR Take 0.5 tablets (12.5 mg total) by mouth 2 (two) times daily.   multivitamin tablet Take 1 tablet by mouth daily.   potassium chloride SA 20 MEQ tablet Commonly known as:  K-DUR,KLOR-CON Take 2 tablets (40 mEq total) by mouth 2 (two) times daily. What changed:    how much to take  when to take this   Vitamin D (Ergocalciferol) 50000 units Caps capsule Commonly known as:   DRISDOL Take 1 capsule (50,000 Units total) by mouth every 7 (seven) days.      Allergies  Allergen Reactions  . Iodinated Diagnostic Agents Other (See Comments)    Kidney failure.   . Ciprofloxacin Itching  . E-Mycin [Erythromycin Base] Nausea And Vomiting  . Oxycodone Other (See Comments)    Hallucination.   Follow-up Information    [facility] Follow up.   Specialty:  Cardiology Why:  You have a stress test scheduled for Monday 05/07/18 at 7:30. Please see attached instructions.  Please call to reschedule if you are unable to keep this appointment.  Contact information: [address], [address] [place]       Skeet Latch, MD Follow up.   Specialty:  Cardiology Why:  Cardiology hospital follow-up on May 11, 2018 at 8:40 AM.  Please arrive 15 minutes early for check-in. Contact information: [address] [place] [address] [phone]            The results of significant diagnostics from this hospitalization (including imaging, microbiology, ancillary and laboratory) are listed below for reference.    Significant Diagnostic Studies: Dg Chest 2 View  Result Date: 05/01/2018 CLINICAL  DATA:  Left breast pain EXAM: CHEST - 2 VIEW COMPARISON:  01/22/2018 FINDINGS: Mild cardiac enlargement without heart failure. Lungs are clear without infiltrate or effusion. IMPRESSION: No active cardiopulmonary disease. Electronically Signed   By: Franchot Gallo M.D.   On: 05/01/2018 10:28   Dg Humerus Left  Result Date: 05/01/2018 CLINICAL DATA:  Left upper arm pain beginning last night. EXAM: LEFT HUMERUS - 2+ VIEW COMPARISON:  None. FINDINGS: Advanced degenerative changes are present at the left shoulder. This predominantly involves the acromion. Calcific tendinopathy is also evident. No acute abnormality is present. The humerus is within normal limits. The visualized clavicle and left lung  fields are within normal limits. IMPRESSION: 1. Advanced degenerative changes involving the left acromion. 2. Calcifications at the rotator cuff insertion site. This could be posttraumatic, but most likely represents calcific tendinitis. Electronically Signed   By: San Morelle M.D.   On: 05/01/2018 10:28    Microbiology: No results found for this or any previous visit (from the past 240 hour(s)).   Labs: Basic Metabolic Panel: Recent Labs  Lab 05/01/18 1029 05/03/18 0537  NA 140 140  K 3.3* 4.0  CL 105 109  CO2 27 24  GLUCOSE 113* 110*  BUN 13 12  CREATININE 0.90 0.99  CALCIUM 8.9 8.5*  MG 1.9  --   PHOS 2.5  --    Liver Function Tests: Recent Labs  Lab 05/01/18 1029  AST 22  ALT 19  ALKPHOS 95  BILITOT 0.7  PROT 6.5  ALBUMIN 3.5   No results for input(s): LIPASE, AMYLASE in the last 168 hours. No results for input(s): AMMONIA in the last 168 hours. CBC: Recent Labs  Lab 05/01/18 1029  WBC 5.3  NEUTROABS 2.9  HGB 12.2  HCT 40.1  MCV 98.0  PLT 131*   Cardiac Enzymes: Recent Labs  Lab 05/01/18 1029 05/01/18 1501 05/01/18 2140 05/02/18 0306  TROPONINI <0.03 <0.03 <0.03 <0.03   BNP: BNP (last 3 results) No results for input(s): BNP in the last 8760 hours.  ProBNP (last 3 results) No results for input(s): PROBNP in the last 8760 hours.  CBG: No results for input(s): GLUCAP in the last 168 hours.  Signed:  Reubin Milan MD.  [facility] 05/03/2018, 11:05 AM

## 2018-05-07 ENCOUNTER — Ambulatory Visit (HOSPITAL_COMMUNITY): Payer: Medicare HMO | Attending: Cardiology

## 2018-05-07 DIAGNOSIS — R0789 Other chest pain: Secondary | ICD-10-CM | POA: Insufficient documentation

## 2018-05-07 MED ORDER — TECHNETIUM TC 99M TETROFOSMIN IV KIT
32.7000 | PACK | Freq: Once | INTRAVENOUS | Status: AC | PRN
  Administered 2018-05-07: 32.7 via INTRAVENOUS
  Filled 2018-05-07: qty 33

## 2018-05-08 ENCOUNTER — Ambulatory Visit (HOSPITAL_COMMUNITY): Payer: Medicare HMO | Attending: Internal Medicine

## 2018-05-08 DIAGNOSIS — J4520 Mild intermittent asthma, uncomplicated: Secondary | ICD-10-CM | POA: Diagnosis not present

## 2018-05-08 DIAGNOSIS — R739 Hyperglycemia, unspecified: Secondary | ICD-10-CM | POA: Diagnosis not present

## 2018-05-08 DIAGNOSIS — I10 Essential (primary) hypertension: Secondary | ICD-10-CM | POA: Diagnosis not present

## 2018-05-08 DIAGNOSIS — R0789 Other chest pain: Secondary | ICD-10-CM | POA: Insufficient documentation

## 2018-05-08 DIAGNOSIS — R072 Precordial pain: Secondary | ICD-10-CM | POA: Diagnosis not present

## 2018-05-08 DIAGNOSIS — D696 Thrombocytopenia, unspecified: Secondary | ICD-10-CM | POA: Diagnosis not present

## 2018-05-08 LAB — MYOCARDIAL PERFUSION IMAGING
CHL CUP RESTING HR STRESS: 73 {beats}/min
CSEPPHR: 100 {beats}/min
LV sys vol: 18 mL
LVDIAVOL: 58 mL (ref 46–106)
SDS: 4
SRS: 3
SSS: 7
TID: 0.97

## 2018-05-08 MED ORDER — TECHNETIUM TC 99M TETROFOSMIN IV KIT
32.6000 | PACK | Freq: Once | INTRAVENOUS | Status: AC | PRN
  Administered 2018-05-08: 32.6 via INTRAVENOUS
  Filled 2018-05-08: qty 33

## 2018-05-08 MED ORDER — REGADENOSON 0.4 MG/5ML IV SOLN
0.4000 mg | Freq: Once | INTRAVENOUS | Status: AC
  Administered 2018-05-08: 0.4 mg via INTRAVENOUS

## 2018-05-11 ENCOUNTER — Encounter: Payer: Self-pay | Admitting: Cardiovascular Disease

## 2018-05-11 ENCOUNTER — Ambulatory Visit: Payer: Medicare HMO | Admitting: Cardiovascular Disease

## 2018-05-11 VITALS — BP 130/64 | HR 66 | Ht 66.0 in | Wt 212.2 lb

## 2018-05-11 DIAGNOSIS — E6609 Other obesity due to excess calories: Secondary | ICD-10-CM | POA: Diagnosis not present

## 2018-05-11 DIAGNOSIS — R0602 Shortness of breath: Secondary | ICD-10-CM | POA: Diagnosis not present

## 2018-05-11 DIAGNOSIS — R0789 Other chest pain: Secondary | ICD-10-CM

## 2018-05-11 DIAGNOSIS — Z6834 Body mass index (BMI) 34.0-34.9, adult: Secondary | ICD-10-CM | POA: Diagnosis not present

## 2018-05-11 DIAGNOSIS — I5032 Chronic diastolic (congestive) heart failure: Secondary | ICD-10-CM

## 2018-05-11 DIAGNOSIS — I10 Essential (primary) hypertension: Secondary | ICD-10-CM

## 2018-05-11 NOTE — Progress Notes (Signed)
Cardiology Office Note   Date:  05/11/2018   ID:  [name], DOB December 10, 1950, MRN [id number]  PCP:  Merrilee Seashore, MD  Cardiologist:   Skeet Latch, MD   No chief complaint on file.    History of Present Illness: [name] is a 66 y.o. female with prior stroke, rheumatoid arthritis, lupus, interstitial lung disease, hypertension, prior lymphoplasmacytoid lymphoma, and asthma here for follow up.  She was initially seen in the ED 04/2018 with chest pain.  She reported L shoulder and chest wall pain.  EKG revealed diffuse T wave inversions.  Cardiac enzymes were negative x3.  She was referred for outpatient stress testing and had a Lexiscan Myoview 05/07/2018 that revealed LVEF 68%.  There was no ischemia.  She previously had an echo 01/2018 that revealed LVEF 60 to 65% with grade 2 diastolic dysfunction.  Since being discharged she has felt well.  She hasn't had any recurrent chest pain.  She continues to have shortness of breath.  She notices that she is short of breath with doing simple activities or when getting out of the shower.  Her feet swell but she has no orthopnea or PND.  Her edema improves with elevation of her legs.  In the past she like to go to the gym but has not been doing this lately because of feeling unsteady on her feet and falling.  Her blood pressure at home has been well controlled in the 110s.   Past Medical History:  Diagnosis Date  . Acid reflux   . Arthritis   . Asthma   . Hypertension   . Lupus ([place]) 07/13/2011  . 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 Stroke [facility])    lt side weakness    Past Surgical History:  Procedure Laterality Date  . ABDOMINAL HYSTERECTOMY    . BACK SURGERY    . CHOLECYSTECTOMY    . FOOT SURGERY     x2  . MASS EXCISION     back  . rortator     rotator cuff     Current Outpatient Medications  Medication Sig Dispense Refill  . aspirin EC 81 MG tablet Take 81 mg by mouth daily.    .  Biotin 1000 MCG tablet Take 1,000 mcg by mouth daily.    . calcium carbonate (OS-CAL) 600 MG TABS Take 600 mg by mouth daily.    . cholecalciferol (VITAMIN D) 1000 UNITS tablet Take 1,000 Units by mouth daily.     . furosemide (LASIX) 80 MG tablet Take 80 mg by mouth daily.    . hydroxychloroquine (PLAQUENIL) 200 MG tablet Take 400 mg by mouth daily.     . Hypromellose (ARTIFICIAL TEARS OP) Place 1 drop into both eyes 4 (four) times daily as needed (for dry eyes).     Marland Kitchen latanoprost (XALATAN) 0.005 % ophthalmic solution Place 1 drop into both eyes at bedtime.    Marland Kitchen losartan (COZAAR) 100 MG tablet Take 100 mg by mouth daily.    . metoprolol tartrate (LOPRESSOR) 25 MG tablet Take 0.5 tablets (12.5 mg total) by mouth 2 (two) times daily. 60 tablet 0  . Multiple Vitamin (MULTIVITAMIN) tablet Take 1 tablet by mouth daily.    . potassium chloride SA (K-DUR,KLOR-CON) 20 MEQ tablet Take 2 tablets (40 mEq total) by mouth 2 (two) times daily.     No current facility-administered medications for this visit.     Allergies:   Iodinated diagnostic agents; Ciprofloxacin; E-mycin [erythromycin  base]; and Oxycodone    Social History:  The patient  reports that she quit smoking about 23 years ago. She has a 10.00 pack-year smoking history. She has never used smokeless tobacco. She reports that she drinks alcohol. She reports that she does not use drugs.   Family History:  The patient's family history includes Coronary artery disease in her brother; Lung disease in her father.    ROS:  Please see the history of present illness.   Otherwise, review of systems are positive for none.   All other systems are reviewed and negative.    PHYSICAL EXAM: VS:  BP (!) 150/82   Pulse 66   Ht 5\' 6"  (1.676 m)   Wt 212 lb 3.2 oz (96.3 kg)   SpO2 98%   BMI 34.25 kg/m  , BMI Body mass index is 34.25 kg/m. GENERAL:  Well appearing HEENT:  Pupils equal round and reactive, fundi not visualized, oral mucosa  unremarkable NECK:  No jugular venous distention, waveform within normal limits, carotid upstroke brisk and symmetric, no bruits, no thyromegaly LYMPHATICS:  No cervical adenopathy LUNGS:  Clear to auscultation bilaterally HEART:  RRR.  PMI not displaced or sustained,S1 and S2 within normal limits, no S3, no S4, no clicks, no rubs, no murmurs ABD:  Flat, positive bowel sounds normal in frequency in pitch, no bruits, no rebound, no guarding, no midline pulsatile mass, no hepatomegaly, no splenomegaly EXT:  2 plus pulses throughout, no edema, no cyanosis no clubbing SKIN:  No rashes no nodules NEURO:  Cranial nerves II through XII grossly intact, motor grossly intact throughout PSYCH:  Cognitively intact, oriented to person place and time   EKG:  EKG is not ordered today.   Lexiscan Myoview 05/07/18:  Nuclear stress EF: 68%.  There was no ST segment deviation noted during stress.  No T wave inversion was noted during stress.  The study is normal.  This is a low risk study.  The left ventricular ejection fraction is hyperdynamic (>65%).   Echo 01/29/18: Study Conclusions  - Left ventricle: The cavity size was normal. Wall thickness was   normal. Systolic function was normal. The estimated ejection   fraction was in the range of 60% to 65%. Wall motion was normal;   there were no regional wall motion abnormalities. Features are   consistent with a pseudonormal left ventricular filling pattern,   with concomitant abnormal relaxation and increased filling   pressure (grade 2 diastolic dysfunction). - Mitral valve: Calcified annulus. There was mild regurgitation. - Pericardium, extracardiac: A trivial pericardial effusion was   identified.  Impressions:  - Normal LV systolic function; moderate diastolic dysfunction;   mild MR.  Recent Labs: 05/01/2018: ALT 19; Hemoglobin 12.2; Magnesium 1.9; Platelets 131 05/03/2018: BUN 12; Creatinine, Ser 0.99; Potassium 4.0; Sodium 140     Lipid Panel No results found for: CHOL, TRIG, HDL, CHOLHDL, VLDL, LDLCALC, LDLDIRECT   09/05/17: Total cholesterol 212, triglycerides 50, HDL 76, LDL 126    Wt Readings from Last 3 Encounters:  05/11/18 212 lb 3.2 oz (96.3 kg)  05/07/18 210 lb (95.3 kg)  05/01/18 210 lb (95.3 kg)      ASSESSMENT AND PLAN:  # Chest pain: Resolved.  Likely musculoskeletal.  Lexiscan Myoview was negative for ischemia 04/2018.  # Shortness of breath: # Chronic diastolic heart failure: Ms. Sween is euvolemic on exam.  She has worked on reducing her fluid intake since leaving the hospital.  Her IVC was not dilated on  echo, suggesting that she is euvolemic and that volume overload is not contributing to her shortness of breath.  She does have underlying lung disease and will be following up with her pulmonologist soon.  It is possible that this is contributing to her dyspnea.  Continue furosemide, losartan, and metoprolol.  #Hypertension: Blood pressure well-controlled at home on losartan and metoprolol.  She thinks her blood pressure is elevated today due to whitecoat hypertension.  I have asked her to continue tracking it at home and bring her machine to her next appointment.  # Obesity: BMP 34.  Her walking is limited by gait instability and falls.  I recommended that she consider an exercise bike or other seated activities to increase her exercise.  Continue to work on diet.   Current medicines are reviewed at length with the patient today.  The patient does not have concerns regarding medicines.  The following changes have been made:  no change  Labs/ tests ordered today include:  No orders of the defined types were placed in this encounter.    Disposition:   FU with Edilson Vital C. Oval Linsey, MD, [facility] in 6 months.     Signed, Astria Jordahl C. Oval Linsey, MD, [facility]  05/11/2018 9:06 AM    [place]

## 2018-05-11 NOTE — Patient Instructions (Signed)
Medication Instructions:  Your physician recommends that you continue on your current medications as directed. Please refer to the Current Medication list given to you today.  If you need a refill on your cardiac medications before your next appointment, please call your pharmacy.   Lab work: NONE   Testing/Procedures: NONE  Follow-Up: At [facility], you and your health needs are our priority.  As part of our continuing mission to provide you with exceptional heart care, we have created designated Provider Care Teams.  These Care Teams include your primary Cardiologist (physician) and Advanced Practice Providers (APPs -  Physician Assistants and Nurse Practitioners) who all work together to provide you with the care you need, when you need it. You will need a follow up appointment in 6 months.  Please call our office 2 months in advance to schedule this appointment.  You may see Skeet Latch, MD or one of the following Advanced Practice Providers on your designated Care Team:   Kerin Ransom, PA-C Roby Lofts, [place] . Sande Rives, PA-C  Any Other Special Instructions Will Be Listed Below (If Applicable). WORK ON EXERCISING [place]

## 2018-05-15 DIAGNOSIS — I10 Essential (primary) hypertension: Secondary | ICD-10-CM | POA: Diagnosis not present

## 2018-05-15 DIAGNOSIS — D696 Thrombocytopenia, unspecified: Secondary | ICD-10-CM | POA: Diagnosis not present

## 2018-05-15 DIAGNOSIS — J4520 Mild intermittent asthma, uncomplicated: Secondary | ICD-10-CM | POA: Diagnosis not present

## 2018-05-16 ENCOUNTER — Encounter: Payer: Self-pay | Admitting: Cardiovascular Disease

## 2018-05-22 DIAGNOSIS — I10 Essential (primary) hypertension: Secondary | ICD-10-CM | POA: Diagnosis not present

## 2018-05-22 DIAGNOSIS — E876 Hypokalemia: Secondary | ICD-10-CM | POA: Diagnosis not present

## 2018-05-28 ENCOUNTER — Telehealth: Payer: Self-pay | Admitting: *Deleted

## 2018-05-28 NOTE — Telephone Encounter (Signed)
Patient in the office with her husband. She spoke with Dr Oval Linsey regarding her low blood pressure and feeling fatigue. Per Dr Oval Linsey she is to d/c her Metoprolol, monitor blood pressure at home, and return for f/u in 2 months. Patient aware

## 2018-05-30 DIAGNOSIS — M67911 Unspecified disorder of synovium and tendon, right shoulder: Secondary | ICD-10-CM | POA: Diagnosis not present

## 2018-05-30 DIAGNOSIS — M67912 Unspecified disorder of synovium and tendon, left shoulder: Secondary | ICD-10-CM | POA: Diagnosis not present

## 2018-06-08 DIAGNOSIS — M47816 Spondylosis without myelopathy or radiculopathy, lumbar region: Secondary | ICD-10-CM | POA: Diagnosis not present

## 2018-06-25 DIAGNOSIS — M25512 Pain in left shoulder: Secondary | ICD-10-CM | POA: Diagnosis not present

## 2018-07-03 DIAGNOSIS — M150 Primary generalized (osteo)arthritis: Secondary | ICD-10-CM | POA: Diagnosis not present

## 2018-07-03 DIAGNOSIS — M3501 Sicca syndrome with keratoconjunctivitis: Secondary | ICD-10-CM | POA: Diagnosis not present

## 2018-07-03 DIAGNOSIS — M79643 Pain in unspecified hand: Secondary | ICD-10-CM | POA: Diagnosis not present

## 2018-07-03 DIAGNOSIS — Z79899 Other long term (current) drug therapy: Secondary | ICD-10-CM | POA: Diagnosis not present

## 2018-07-03 DIAGNOSIS — M549 Dorsalgia, unspecified: Secondary | ICD-10-CM | POA: Diagnosis not present

## 2018-07-03 DIAGNOSIS — M25511 Pain in right shoulder: Secondary | ICD-10-CM | POA: Diagnosis not present

## 2018-07-03 DIAGNOSIS — M329 Systemic lupus erythematosus, unspecified: Secondary | ICD-10-CM | POA: Diagnosis not present

## 2018-07-03 DIAGNOSIS — M25569 Pain in unspecified knee: Secondary | ICD-10-CM | POA: Diagnosis not present

## 2018-07-03 DIAGNOSIS — M064 Inflammatory polyarthropathy: Secondary | ICD-10-CM | POA: Diagnosis not present

## 2018-07-05 DIAGNOSIS — M25512 Pain in left shoulder: Secondary | ICD-10-CM | POA: Diagnosis not present

## 2018-07-09 DIAGNOSIS — I10 Essential (primary) hypertension: Secondary | ICD-10-CM | POA: Diagnosis not present

## 2018-07-11 DIAGNOSIS — M47816 Spondylosis without myelopathy or radiculopathy, lumbar region: Secondary | ICD-10-CM | POA: Diagnosis not present

## 2018-07-13 DIAGNOSIS — L708 Other acne: Secondary | ICD-10-CM | POA: Diagnosis not present

## 2018-07-13 DIAGNOSIS — B0089 Other herpesviral infection: Secondary | ICD-10-CM | POA: Diagnosis not present

## 2018-07-13 DIAGNOSIS — L728 Other follicular cysts of the skin and subcutaneous tissue: Secondary | ICD-10-CM | POA: Diagnosis not present

## 2018-07-16 DIAGNOSIS — M19212 Secondary osteoarthritis, left shoulder: Secondary | ICD-10-CM | POA: Diagnosis not present

## 2018-07-23 DIAGNOSIS — J0191 Acute recurrent sinusitis, unspecified: Secondary | ICD-10-CM | POA: Diagnosis not present

## 2018-07-23 DIAGNOSIS — Z9109 Other allergy status, other than to drugs and biological substances: Secondary | ICD-10-CM | POA: Diagnosis not present

## 2018-07-23 DIAGNOSIS — I10 Essential (primary) hypertension: Secondary | ICD-10-CM | POA: Diagnosis not present

## 2018-07-30 ENCOUNTER — Telehealth: Payer: Self-pay | Admitting: Cardiovascular Disease

## 2018-07-30 ENCOUNTER — Other Ambulatory Visit: Payer: Self-pay | Admitting: Orthopedic Surgery

## 2018-07-30 NOTE — Telephone Encounter (Signed)
   Primary Cardiologist: Skeet Latch, MD  Chart reviewed as part of pre-operative protocol coverage. Patient was contacted 07/30/2018 in reference to pre-operative risk assessment for pending surgery as outlined below.  [name] was last seen on 05/11/2018 by Dr. Oval Linsey.  Since that day, [name] has done well from a cardiac standpoint. No issues with chest pain or SOB. She is able to easily complete 4 METs without anginal symptoms. Therefore, based on ACC/AHA guidelines, the patient would be at acceptable risk for the planned procedure without further cardiovascular testing.   In regards to her aspirin, she is on this medication for her history of strokes. Recommend contacting her neurologist, Dr. Posey Pronto, for input on whether she can hold aspirin/how long. No contraindication for holding aspirin from a cardiology standpoint.   I will route this recommendation to the requesting party via Epic fax function and remove from pre-op pool.  Please call with questions.  Abigail Butts, PA-C 07/30/2018, 1:22 PM

## 2018-07-30 NOTE — Telephone Encounter (Signed)
° ° °  Call from [facility] states they faxed clearance in 1/17 (not found)     [facility] Pre-operative Risk Assessment    Request for surgical clearance:  1. What type of surgery is being performed? LEFT SHOULDER REPAIR  2. When is this surgery scheduled?  08/16/2018  3. What type of clearance is required (medical clearance vs. Pharmacy clearance to hold med vs. Both)? BOTH  4. Are there any medications that need to be held prior to surgery and how long?  5. Practice name and name of physician performing surgery? [facility]  6. What is your office phone number [phone]   7.   What is your office fax number [phone]  8.   Anesthesia type (None, local, MAC, general) ? CHOICE   Laurier Nancy 07/30/2018, 12:27 PM  _________________________________________________________________   (provider comments below)

## 2018-08-03 ENCOUNTER — Telehealth: Payer: Self-pay | Admitting: Neurology

## 2018-08-03 NOTE — Telephone Encounter (Signed)
[name] called From [facility] (Dr. Bettina Gavia office)  and she is checking on the status of the Surgical Clearance that she faxed over on 08/01/2018. Please Call. Thanks

## 2018-08-03 NOTE — Telephone Encounter (Signed)
Patient is sch for 08-16-18 and [name] from [facility].  ( Dr Bettina Gavia office) she wants to know the status of the surgical clearance that she faxed over

## 2018-08-03 NOTE — Telephone Encounter (Signed)
Faxed back.  Not a Dr. Posey Pronto patient.

## 2018-08-08 NOTE — Pre-Procedure Instructions (Signed)
KELSI BENHAM  08/08/2018      [place] ([place]), [place] [address] [place] ([place])  [address] Phone: [phone] Fax: [phone]    Your procedure is scheduled on February 13  Report to [facility] at 1000 A.M.  Call this number if you have problems the morning of surgery:  [phone]   Remember:  Do not eat or drink after midnight.    Take these medicines the morning of surgery with A SIP OF WATER  albuterol (PROVENTIL HFA;VENTOLIN HFA) bring with you the day of surgery budesonide-formoterol (SYMBICORT Eye drops if needed  7 days prior to surgery STOP taking any Aspirin (unless otherwise instructed by your surgeon), Aleve, Naproxen, Ibuprofen, Motrin, Advil, Goody's, BC's, all herbal medications, fish oil, and all vitamins.  Follow your surgeon's instructions on when to stop Asprin.  If no instructions were given by your surgeon then you will need to call the office to get those instructions.       Do not wear jewelry, make-up or nail polish.  Do not wear lotions, powders, or perfumes, or deodorant.  Do not shave 48 hours prior to surgery.   Do not bring valuables to the hospital.  [facility] is not responsible for any belongings or valuables.  Contacts, dentures or bridgework may not be worn into surgery.  Leave your suitcase in the car.  After surgery it may be brought to your room.  For patients admitted to the hospital, discharge time will be determined by your treatment team.  Patients discharged the day of surgery will not be allowed to drive home.    Special instructions:   [facility]- Preparing For Surgery  Before surgery, you can play an important role. Because skin is not sterile, your skin needs to be as free of germs as possible. You can reduce the number of germs on your skin by washing with CHG (chlorahexidine gluconate) Soap before surgery.  CHG is an antiseptic cleaner which  kills germs and bonds with the skin to continue killing germs even after washing.    Oral Hygiene is also important to reduce your risk of infection.  Remember - BRUSH YOUR TEETH THE MORNING OF SURGERY WITH YOUR REGULAR TOOTHPASTE  Please do not use if you have an allergy to CHG or antibacterial soaps. If your skin becomes reddened/irritated stop using the CHG.  Do not shave (including legs and underarms) for at least 48 hours prior to first CHG shower. It is OK to shave your face.  Please follow these instructions carefully.   1. Shower the NIGHT BEFORE SURGERY and the MORNING OF SURGERY with CHG.   2. If you chose to wash your hair, wash your hair first as usual with your normal shampoo.  3. After you shampoo, rinse your hair and body thoroughly to remove the shampoo.  4. Use CHG as you would any other liquid soap. You can apply CHG directly to the skin and wash gently with a scrungie or a clean washcloth.   5. Apply the CHG Soap to your body ONLY FROM THE NECK DOWN.  Do not use on open wounds or open sores. Avoid contact with your eyes, ears, mouth and genitals (private parts). Wash Face and genitals (private parts)  with your normal soap.  6. Wash thoroughly, paying special attention to the area where your surgery will be performed.  7. Thoroughly rinse your body with warm water  from the neck down.  8. DO NOT shower/wash with your normal soap after using and rinsing off the CHG Soap.  9. Pat yourself dry with a CLEAN TOWEL.  10. Wear CLEAN PAJAMAS to bed the night before surgery, wear comfortable clothes the morning of surgery  11. Place CLEAN SHEETS on your bed the night of your first shower and DO NOT SLEEP WITH PETS.    Day of Surgery:  Do not apply any deodorants/lotions.  Please wear clean clothes to the hospital/surgery center.   Remember to brush your teeth WITH YOUR REGULAR TOOTHPASTE.    Please read over the following fact sheets that you were  given.

## 2018-08-09 ENCOUNTER — Other Ambulatory Visit: Payer: Self-pay

## 2018-08-09 ENCOUNTER — Encounter (HOSPITAL_COMMUNITY): Payer: Self-pay

## 2018-08-09 ENCOUNTER — Encounter (HOSPITAL_COMMUNITY)
Admission: RE | Admit: 2018-08-09 | Discharge: 2018-08-09 | Disposition: A | Discharge status: Home / Self Care | Payer: Medicare HMO | Source: Ambulatory Visit | Attending: Orthopedic Surgery | Admitting: Orthopedic Surgery

## 2018-08-09 DIAGNOSIS — Z01812 Encounter for preprocedural laboratory examination: Secondary | ICD-10-CM | POA: Insufficient documentation

## 2018-08-09 HISTORY — DX: Nausea with vomiting, unspecified: R11.2

## 2018-08-09 HISTORY — DX: Other specified postprocedural states: Z98.890

## 2018-08-09 LAB — CBC WITH DIFFERENTIAL/PLATELET
Abs Immature Granulocytes: 0.02 10*3/uL (ref 0.00–0.07)
Basophils Absolute: 0.1 10*3/uL (ref 0.0–0.1)
Basophils Relative: 1 %
Eosinophils Absolute: 0.1 10*3/uL (ref 0.0–0.5)
Eosinophils Relative: 2 %
HCT: 39.4 % (ref 36.0–46.0)
Hemoglobin: 12.3 g/dL (ref 12.0–15.0)
Immature Granulocytes: 0 %
Lymphocytes Relative: 38 %
Lymphs Abs: 2.3 10*3/uL (ref 0.7–4.0)
MCH: 29.6 pg (ref 26.0–34.0)
MCHC: 31.2 g/dL (ref 30.0–36.0)
MCV: 94.7 fL (ref 80.0–100.0)
Monocytes Absolute: 0.6 10*3/uL (ref 0.1–1.0)
Monocytes Relative: 10 %
Neutro Abs: 2.9 10*3/uL (ref 1.7–7.7)
Neutrophils Relative %: 49 %
Platelets: 185 10*3/uL (ref 150–400)
RBC: 4.16 MIL/uL (ref 3.87–5.11)
RDW: 13.1 % (ref 11.5–15.5)
WBC: 6 10*3/uL (ref 4.0–10.5)
nRBC: 0 % (ref 0.0–0.2)

## 2018-08-09 LAB — URINALYSIS, ROUTINE W REFLEX MICROSCOPIC
Bilirubin Urine: NEGATIVE
Glucose, UA: NEGATIVE mg/dL
Hgb urine dipstick: NEGATIVE
Ketones, ur: NEGATIVE mg/dL
Leukocytes, UA: NEGATIVE
Nitrite: NEGATIVE
Protein, ur: NEGATIVE mg/dL
Specific Gravity, Urine: 1.006 (ref 1.005–1.030)
pH: 6 (ref 5.0–8.0)

## 2018-08-09 LAB — COMPREHENSIVE METABOLIC PANEL
ALT: 13 U/L (ref 0–44)
AST: 19 U/L (ref 15–41)
Albumin: 3.5 g/dL (ref 3.5–5.0)
Alkaline Phosphatase: 90 U/L (ref 38–126)
Anion gap: 13 (ref 5–15)
BILIRUBIN TOTAL: 0.7 mg/dL (ref 0.3–1.2)
BUN: 6 mg/dL — ABNORMAL LOW (ref 8–23)
CO2: 23 mmol/L (ref 22–32)
Calcium: 9.1 mg/dL (ref 8.9–10.3)
Chloride: 105 mmol/L (ref 98–111)
Creatinine, Ser: 0.79 mg/dL (ref 0.44–1.00)
GFR calc Af Amer: >60 mL/min (ref >60)
GFR calc non Af Amer: >60 mL/min (ref >60)
Glucose, Bld: 100 mg/dL — ABNORMAL HIGH (ref 70–99)
Potassium: 3.6 mmol/L (ref 3.5–5.1)
Sodium: 141 mmol/L (ref 135–145)
TOTAL PROTEIN: 6.3 g/dL — AB (ref 6.5–8.1)

## 2018-08-09 LAB — SURGICAL PCR SCREEN
MRSA, PCR: NEGATIVE
Staphylococcus aureus: NEGATIVE

## 2018-08-09 LAB — TYPE AND SCREEN
ABO/RH(D): B POS
Antibody Screen: NEGATIVE

## 2018-08-09 LAB — PROTIME-INR
INR: 1.13
Prothrombin Time: 14.4 seconds (ref 11.4–15.2)

## 2018-08-09 LAB — APTT: aPTT: 27 seconds (ref 24–36)

## 2018-08-09 NOTE — Progress Notes (Signed)
PCP - Merrilee Seashore Cardiologist - Tiffany Ravanna - clearance in epic  Chest x-ray - 05/01/18 EKG - 05/01/18 Stress Test - 05/07/18 ECHO - 01/29/18 Cardiac Cath - denies   Aspirin Instructions: instructed to stop 5 days  Anesthesia review: yes, cardiac history Patient had recent sinus infection dx on 1/20 patient went to PCP where she was treated with doxycycline 100mg  for 10 days patient stated that she is still a little stuffy.  Instructed patient to start an allergy pill to help with symptoms  Patient denies shortness of breath, fever, cough and chest pain at PAT appointment   Patient verbalized understanding of instructions that were given to them at the PAT appointment. Patient was also instructed that they will need to review over the PAT instructions again at home before surgery.

## 2018-08-10 NOTE — Anesthesia Preprocedure Evaluation (Addendum)
Anesthesia Evaluation  Patient identified by MRN, date of birth, ID band Patient awake    Reviewed: Allergy & Precautions, NPO status , Patient's Chart, lab work & pertinent test results  History of Anesthesia Complications (+) PONV and history of anesthetic complications  Airway Mallampati: I  TM Distance: >3 FB Neck ROM: Full  Mouth opening: Limited Mouth Opening  Dental no notable dental hx. (+) Edentulous Upper, Edentulous Lower   Pulmonary asthma , former smoker,    Pulmonary exam normal breath sounds clear to auscultation       Cardiovascular hypertension, negative cardio ROS Normal cardiovascular exam Rhythm:Regular Rate:Normal  EKG: 05/01/2018: Sinus rhythm, Diffuse nonspecific t abnormalities. Rate 71.  Nuclear stress 05/08/2018: Nuclear stress EF: 68%. There was no ST segment deviation noted during stress. No T wave inversion was noted during stress. The study is normal. This is a low risk study. The left ventricular ejection fraction is hyperdynamic (>65%).  TTE 01/29/2018: - Left ventricle: The cavity size was normal. Wall thickness wasnormal. Systolic function was normal. The estimated ejection fraction was in the range of 60% to 65%. Wall motion was normal,there were no regional wall motion abnormalities. Features are consistent with a pseudonormal left ventricular filling pattern, with concomitant abnormal relaxation and increased filling pressure (grade 2 diastolic dysfunction). - Mitral valve: Calcified annulus. There was mild regurgitation.     Neuro/Psych CVA (2007, left sided weakness), Residual Symptoms negative psych ROS   GI/Hepatic Neg liver ROS,   Endo/Other  negative endocrine ROSLupus on plaquenil  Renal/GU negative Renal ROS  negative genitourinary   Musculoskeletal  (+) Arthritis , Osteoarthritis,    Abdominal   Peds  Hematology negative hematology ROS (+)   Anesthesia  Other Findings   Reproductive/Obstetrics negative OB ROS                           Anesthesia Physical Anesthesia Plan  ASA: III  Anesthesia Plan: General and Regional   Post-op Pain Management:  Regional for Post-op pain   Induction: Intravenous  PONV Risk Score and Plan: 4 or greater and Ondansetron, Dexamethasone and Scopolamine patch - Pre-op  Airway Management Planned: Oral ETT  Additional Equipment:   Intra-op Plan:   Post-operative Plan: Extubation in OR  Informed Consent: I have reviewed the patients History and Physical, chart, labs and discussed the procedure including the risks, benefits and alternatives for the proposed anesthesia with the patient or authorized representative who has indicated his/her understanding and acceptance.     Dental advisory given  Plan Discussed with: CRNA  Anesthesia Plan Comments: ( )      Anesthesia Quick Evaluation

## 2018-08-10 NOTE — Progress Notes (Signed)
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 Pertinent hx includes PONV, Lupus, Asthma, HTN, GERD, CVA with some residual Left side weakness, Lymphoma.   Recently admitted 10/29-10/31/19 for eval of chest pain. She reported L shoulder and chest wall pain.  EKG revealed diffuse T wave inversions.  Cardiac enzymes were negative x3.  She was referred for outpatient stress testing and had a Lexiscan Myoview 05/07/2018 that revealed LVEF 68%.  There was no ischemia.  She previously had an echo 01/2018 that revealed LVEF 60 to 65% with grade 2 diastolic dysfunction.    She was seen in followup by Dr. Oval Linsey 05/11/2018. Per her note: "Since being discharged she has felt well.  She hasn't had any recurrent chest pain.  She continues to have shortness of breath.  She notices that she is short of breath with doing simple activities or when getting out of the shower.  Her feet swell but she has no orthopnea or PND.  Her edema improves with elevation of her legs.  In the past she like to go to the gym but has not been doing this lately because of feeling unsteady on her feet and falling.  Her blood pressure at home has been well controlled in the 110s."  Cardiac clearance 07/30/18 by Roby Lofts, PA-C states pt is easily able to achieve 4 METs without anginal symptoms.  Elevated BP noted at PAT appt. Review of recent PCP records actually indicates pt has struggled lately with hypotension, medications were adjusted. She checks BP at home, says her systolic pressure lately 220U-542H, reports history of white coat htn. Advised her to check regularly and f/u with PCP if it  remains elevated.  Anticipate she can proceed as planned barring acute status change and BP acceptable on DOS.  VS: BP (!) 182/76   Pulse 81   Temp 36.8 C   Resp 20   Ht 5\' 6"  (1.676 m)   Wt 93.9 kg   SpO2 95%   BMI 33.43 kg/m   PROVIDERS: Merrilee Seashore, MD is PCP  Skeet Latch, MD is Cardiologist  LABS: Labs reviewed: Acceptable for surgery. (all labs ordered are listed, but only abnormal results are displayed)  Labs Reviewed  COMPREHENSIVE METABOLIC PANEL - Abnormal; Notable for the following components:      Result Value   Glucose, Bld 100 (*)    BUN 6 (*)    Total Protein 6.3 (*)    All other components within normal limits  URINALYSIS, ROUTINE W REFLEX MICROSCOPIC - Abnormal; Notable for the following components:   Color, Urine STRAW (*)    All other components within normal limits  SURGICAL PCR SCREEN  APTT  CBC WITH DIFFERENTIAL/PLATELET  PROTIME-INR  TYPE AND SCREEN     IMAGES: CHEST - 2 VIEW 05/01/2018  COMPARISON:  01/22/2018  FINDINGS: Mild cardiac enlargement without heart failure. Lungs are clear without infiltrate or effusion.  IMPRESSION: No active cardiopulmonary disease.  EKG: 05/01/2018: Sinus rhythm, Diffuse nonspecific t abnormalities. Rate 71.  CV: Nuclear stress 05/08/2018:  Nuclear stress EF: 68%.  There was no ST segment deviation noted during  stress.  No T wave inversion was noted during stress.  The study is normal.  This is a low risk study.  The left ventricular ejection fraction is hyperdynamic (>65%).  TTE 01/29/2018: Study Conclusions  - Left ventricle: The cavity size was normal. Wall thickness was   normal. Systolic function was normal. The estimated ejection   fraction was in the range of 60% to 65%. Wall motion was normal;   there were no regional wall motion abnormalities. Features are   consistent with a pseudonormal left ventricular filling pattern,   with concomitant abnormal relaxation  and increased filling   pressure (grade 2 diastolic dysfunction). - Mitral valve: Calcified annulus. There was mild regurgitation. - Pericardium, extracardiac: A trivial pericardial effusion was   identified.  Impressions:  - Normal LV systolic function; moderate diastolic dysfunction;   mild MR.  Past Medical History:  Diagnosis Date  . Acid reflux   . Arthritis   . Asthma   . Hypertension   . Lupus ([place]) 07/13/2011  . 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 PONV (postoperative nausea and vomiting)   . Stroke [facility])    lt side weakness    Past Surgical History:  Procedure Laterality Date  . ABDOMINAL HYSTERECTOMY    . BACK SURGERY    . CHOLECYSTECTOMY    . COLONOSCOPY    . FOOT SURGERY     x2  . MASS EXCISION     back  . rortator     rotator cuff    MEDICATIONS: . albuterol (PROVENTIL HFA;VENTOLIN HFA) 108 (90 Base) MCG/ACT inhaler  . aspirin EC 81 MG tablet  . Biotin 1000 MCG tablet  . budesonide-formoterol (SYMBICORT) 160-4.5 MCG/ACT inhaler  . Calcium Carb-Cholecalciferol (CALCIUM 600/VITAMIN D3 PO)  . cholecalciferol (VITAMIN D) 1000 UNITS tablet  . furosemide (LASIX) 80 MG tablet  . hydroxychloroquine (PLAQUENIL) 200 MG tablet  . latanoprost (XALATAN) 0.005 % ophthalmic solution  . losartan (COZAAR) 100 MG tablet  . Multiple Vitamin (MULTIVITAMIN) tablet  . Polyethyl Glycol-Propyl Glycol (SYSTANE) 0.4-0.3 % GEL ophthalmic gel  . potassium chloride SA (K-DUR,KLOR-CON) 20 MEQ tablet   No current facility-administered medications for this encounter.     Wynonia Musty [facility] Short Stay Center/Anesthesiology Phone [phone] 08/10/2018 11:54 AM

## 2018-08-15 MED ORDER — TRANEXAMIC ACID-NACL 1000-0.7 MG/100ML-% IV SOLN
1000.0000 mg | INTRAVENOUS | Status: AC
  Administered 2018-08-16: 1000 mg via INTRAVENOUS
  Filled 2018-08-15: qty 100

## 2018-08-16 ENCOUNTER — Inpatient Hospital Stay (HOSPITAL_COMMUNITY): Payer: Medicare HMO

## 2018-08-16 ENCOUNTER — Encounter (HOSPITAL_COMMUNITY)
Admission: RE | Disposition: A | Discharge status: Home / Self Care | Payer: Self-pay | Source: Home / Self Care | Attending: Orthopedic Surgery

## 2018-08-16 ENCOUNTER — Encounter (HOSPITAL_COMMUNITY): Payer: Self-pay

## 2018-08-16 ENCOUNTER — Other Ambulatory Visit: Payer: Self-pay

## 2018-08-16 ENCOUNTER — Inpatient Hospital Stay (HOSPITAL_COMMUNITY)
Admission: RE | Admit: 2018-08-16 | Discharge: 2018-08-17 | DRG: 483 | Disposition: A | Discharge status: Home / Self Care | Payer: Medicare HMO | Attending: Orthopedic Surgery | Admitting: Orthopedic Surgery

## 2018-08-16 ENCOUNTER — Inpatient Hospital Stay (HOSPITAL_COMMUNITY): Payer: Medicare HMO | Admitting: Physician Assistant

## 2018-08-16 ENCOUNTER — Inpatient Hospital Stay (HOSPITAL_COMMUNITY): Payer: Medicare HMO | Admitting: Certified Registered Nurse Anesthetist

## 2018-08-16 DIAGNOSIS — Z836 Family history of other diseases of the respiratory system: Secondary | ICD-10-CM | POA: Diagnosis not present

## 2018-08-16 DIAGNOSIS — Z881 Allergy status to other antibiotic agents status: Secondary | ICD-10-CM | POA: Diagnosis not present

## 2018-08-16 DIAGNOSIS — Z7982 Long term (current) use of aspirin: Secondary | ICD-10-CM

## 2018-08-16 DIAGNOSIS — J45909 Unspecified asthma, uncomplicated: Secondary | ICD-10-CM | POA: Diagnosis present

## 2018-08-16 DIAGNOSIS — I69354 Hemiplegia and hemiparesis following cerebral infarction affecting left non-dominant side: Secondary | ICD-10-CM

## 2018-08-16 DIAGNOSIS — M7522 Bicipital tendinitis, left shoulder: Secondary | ICD-10-CM | POA: Diagnosis not present

## 2018-08-16 DIAGNOSIS — I10 Essential (primary) hypertension: Secondary | ICD-10-CM | POA: Diagnosis present

## 2018-08-16 DIAGNOSIS — Z23 Encounter for immunization: Secondary | ICD-10-CM

## 2018-08-16 DIAGNOSIS — K219 Gastro-esophageal reflux disease without esophagitis: Secondary | ICD-10-CM | POA: Diagnosis not present

## 2018-08-16 DIAGNOSIS — Z96612 Presence of left artificial shoulder joint: Secondary | ICD-10-CM | POA: Diagnosis not present

## 2018-08-16 DIAGNOSIS — Z471 Aftercare following joint replacement surgery: Secondary | ICD-10-CM | POA: Diagnosis not present

## 2018-08-16 DIAGNOSIS — M329 Systemic lupus erythematosus, unspecified: Secondary | ICD-10-CM | POA: Diagnosis present

## 2018-08-16 DIAGNOSIS — Z7901 Long term (current) use of anticoagulants: Secondary | ICD-10-CM | POA: Diagnosis not present

## 2018-08-16 DIAGNOSIS — Z885 Allergy status to narcotic agent status: Secondary | ICD-10-CM

## 2018-08-16 DIAGNOSIS — G8918 Other acute postprocedural pain: Secondary | ICD-10-CM | POA: Diagnosis not present

## 2018-08-16 DIAGNOSIS — M19012 Primary osteoarthritis, left shoulder: Principal | ICD-10-CM | POA: Diagnosis present

## 2018-08-16 DIAGNOSIS — Z7951 Long term (current) use of inhaled steroids: Secondary | ICD-10-CM | POA: Diagnosis not present

## 2018-08-16 DIAGNOSIS — M25512 Pain in left shoulder: Secondary | ICD-10-CM | POA: Diagnosis present

## 2018-08-16 DIAGNOSIS — Z8249 Family history of ischemic heart disease and other diseases of the circulatory system: Secondary | ICD-10-CM

## 2018-08-16 DIAGNOSIS — Z8572 Personal history of non-Hodgkin lymphomas: Secondary | ICD-10-CM | POA: Diagnosis not present

## 2018-08-16 DIAGNOSIS — M75102 Unspecified rotator cuff tear or rupture of left shoulder, not specified as traumatic: Secondary | ICD-10-CM | POA: Diagnosis not present

## 2018-08-16 DIAGNOSIS — Z91041 Radiographic dye allergy status: Secondary | ICD-10-CM | POA: Diagnosis not present

## 2018-08-16 HISTORY — PX: TOTAL SHOULDER ARTHROPLASTY: SHX126

## 2018-08-16 SURGERY — ARTHROPLASTY, SHOULDER, TOTAL
Anesthesia: Regional | Laterality: Left

## 2018-08-16 MED ORDER — ONDANSETRON HCL 4 MG/2ML IJ SOLN
INTRAMUSCULAR | Status: AC
  Filled 2018-08-16: qty 2

## 2018-08-16 MED ORDER — LACTATED RINGERS IV SOLN
INTRAVENOUS | Status: DC
  Administered 2018-08-16: 09:00:00 via INTRAVENOUS

## 2018-08-16 MED ORDER — ONDANSETRON HCL 4 MG/2ML IJ SOLN: 4.0000 mg | Freq: Four times a day (QID) | INTRAMUSCULAR | Status: DC | PRN

## 2018-08-16 MED ORDER — ALUM & MAG HYDROXIDE-SIMETH 200-200-20 MG/5ML PO SUSP: 30.0000 mL | ORAL | Status: DC | PRN

## 2018-08-16 MED ORDER — HYDROXYCHLOROQUINE SULFATE 200 MG PO TABS
400.0000 mg | ORAL_TABLET | Freq: Every day | ORAL | Status: DC
  Administered 2018-08-17: 400 mg via ORAL
  Filled 2018-08-16: qty 2

## 2018-08-16 MED ORDER — BISACODYL 5 MG PO TBEC: 5.0000 mg | DELAYED_RELEASE_TABLET | Freq: Every day | ORAL | Status: DC | PRN

## 2018-08-16 MED ORDER — ACETAMINOPHEN 500 MG PO TABS
ORAL_TABLET | ORAL | Status: AC
  Filled 2018-08-16: qty 2

## 2018-08-16 MED ORDER — POLYVINYL ALCOHOL 1.4 % OP SOLN
1.0000 [drp] | Freq: Two times a day (BID) | OPHTHALMIC | Status: DC | PRN
  Filled 2018-08-16: qty 15

## 2018-08-16 MED ORDER — FENTANYL CITRATE (PF) 100 MCG/2ML IJ SOLN
INTRAMUSCULAR | Status: AC
  Filled 2018-08-16: qty 2

## 2018-08-16 MED ORDER — FUROSEMIDE 40 MG PO TABS
40.0000 mg | ORAL_TABLET | Freq: Every day | ORAL | Status: DC
  Administered 2018-08-17: 40 mg via ORAL
  Filled 2018-08-16 (×2): qty 1

## 2018-08-16 MED ORDER — CEFAZOLIN SODIUM-DEXTROSE 2-4 GM/100ML-% IV SOLN
INTRAVENOUS | Status: AC
  Filled 2018-08-16: qty 100

## 2018-08-16 MED ORDER — SODIUM CHLORIDE 0.9 % IV SOLN
INTRAVENOUS | Status: DC | PRN
  Administered 2018-08-16: 20 ug/min via INTRAVENOUS

## 2018-08-16 MED ORDER — LIDOCAINE 2% (20 MG/ML) 5 ML SYRINGE
INTRAMUSCULAR | Status: AC
  Filled 2018-08-16: qty 5

## 2018-08-16 MED ORDER — SCOPOLAMINE 1 MG/3DAYS TD PT72
MEDICATED_PATCH | TRANSDERMAL | Status: AC
  Filled 2018-08-16: qty 1

## 2018-08-16 MED ORDER — HYDROCODONE-ACETAMINOPHEN 5-325 MG PO TABS
1.0000 | ORAL_TABLET | ORAL | Status: DC | PRN
  Administered 2018-08-16: 1 via ORAL
  Filled 2018-08-16: qty 2

## 2018-08-16 MED ORDER — 0.9 % SODIUM CHLORIDE (POUR BTL) OPTIME
TOPICAL | Status: DC | PRN
  Administered 2018-08-16: 1000 mL

## 2018-08-16 MED ORDER — CEFAZOLIN SODIUM-DEXTROSE 2-4 GM/100ML-% IV SOLN
2.0000 g | INTRAVENOUS | Status: AC
  Administered 2018-08-16: 2 g via INTRAVENOUS

## 2018-08-16 MED ORDER — FENTANYL CITRATE (PF) 250 MCG/5ML IJ SOLN
INTRAMUSCULAR | Status: AC
  Filled 2018-08-16: qty 5

## 2018-08-16 MED ORDER — BUPIVACAINE LIPOSOME 1.3 % IJ SUSP
INTRAMUSCULAR | Status: DC | PRN
  Administered 2018-08-16: 10 mL

## 2018-08-16 MED ORDER — ACETAMINOPHEN 325 MG PO TABS: 325.0000 mg | ORAL_TABLET | Freq: Four times a day (QID) | ORAL | Status: DC | PRN

## 2018-08-16 MED ORDER — ALBUTEROL SULFATE (2.5 MG/3ML) 0.083% IN NEBU: 3.0000 mL | INHALATION_SOLUTION | Freq: Four times a day (QID) | RESPIRATORY_TRACT | Status: DC | PRN

## 2018-08-16 MED ORDER — DEXAMETHASONE SODIUM PHOSPHATE 10 MG/ML IJ SOLN
INTRAMUSCULAR | Status: AC
  Filled 2018-08-16: qty 1

## 2018-08-16 MED ORDER — ONDANSETRON HCL 4 MG/2ML IJ SOLN
INTRAMUSCULAR | Status: DC | PRN
  Administered 2018-08-16: 4 mg via INTRAVENOUS

## 2018-08-16 MED ORDER — SCOPOLAMINE 1 MG/3DAYS TD PT72
1.0000 | MEDICATED_PATCH | TRANSDERMAL | Status: DC
  Administered 2018-08-16: 1.5 mg via TRANSDERMAL

## 2018-08-16 MED ORDER — POTASSIUM CHLORIDE CRYS ER 20 MEQ PO TBCR
40.0000 meq | EXTENDED_RELEASE_TABLET | Freq: Two times a day (BID) | ORAL | Status: DC
  Administered 2018-08-16 – 2018-08-17 (×2): 40 meq via ORAL
  Filled 2018-08-16 (×3): qty 2

## 2018-08-16 MED ORDER — LOSARTAN POTASSIUM 50 MG PO TABS
100.0000 mg | ORAL_TABLET | Freq: Every day | ORAL | Status: DC
  Administered 2018-08-17: 100 mg via ORAL
  Filled 2018-08-16: qty 2

## 2018-08-16 MED ORDER — PHENOL 1.4 % MT LIQD: 1.0000 | OROMUCOSAL | Status: DC | PRN

## 2018-08-16 MED ORDER — CEFAZOLIN SODIUM-DEXTROSE 2-4 GM/100ML-% IV SOLN
2.0000 g | Freq: Four times a day (QID) | INTRAVENOUS | Status: AC
  Administered 2018-08-16 – 2018-08-17 (×3): 2 g via INTRAVENOUS
  Filled 2018-08-16 (×3): qty 100

## 2018-08-16 MED ORDER — SUGAMMADEX SODIUM 200 MG/2ML IV SOLN
INTRAVENOUS | Status: DC | PRN
  Administered 2018-08-16: 187.8 mg via INTRAVENOUS

## 2018-08-16 MED ORDER — DOCUSATE SODIUM 100 MG PO CAPS
100.0000 mg | ORAL_CAPSULE | Freq: Two times a day (BID) | ORAL | Status: DC
  Filled 2018-08-16 (×2): qty 1

## 2018-08-16 MED ORDER — DEXAMETHASONE SODIUM PHOSPHATE 10 MG/ML IJ SOLN
INTRAMUSCULAR | Status: DC | PRN
  Administered 2018-08-16: 4 mg via INTRAVENOUS

## 2018-08-16 MED ORDER — METOCLOPRAMIDE HCL 5 MG PO TABS: 5.0000 mg | ORAL_TABLET | Freq: Three times a day (TID) | ORAL | Status: DC | PRN

## 2018-08-16 MED ORDER — SODIUM CHLORIDE 0.9 % IV SOLN
INTRAVENOUS | Status: DC
  Administered 2018-08-16: 14:00:00 via INTRAVENOUS

## 2018-08-16 MED ORDER — PNEUMOCOCCAL VAC POLYVALENT 25 MCG/0.5ML IJ INJ
0.5000 mL | INJECTION | INTRAMUSCULAR | Status: AC
  Administered 2018-08-17: 0.5 mL via INTRAMUSCULAR
  Filled 2018-08-16: qty 0.5

## 2018-08-16 MED ORDER — ONDANSETRON HCL 4 MG PO TABS: 4.0000 mg | ORAL_TABLET | Freq: Four times a day (QID) | ORAL | Status: DC | PRN

## 2018-08-16 MED ORDER — POLYETHYLENE GLYCOL 3350 17 G PO PACK: 17.0000 g | PACK | Freq: Every day | ORAL | Status: DC | PRN

## 2018-08-16 MED ORDER — ASPIRIN EC 325 MG PO TBEC
325.0000 mg | DELAYED_RELEASE_TABLET | Freq: Two times a day (BID) | ORAL | Status: DC
  Administered 2018-08-16 – 2018-08-17 (×2): 325 mg via ORAL
  Filled 2018-08-16 (×2): qty 1

## 2018-08-16 MED ORDER — MORPHINE SULFATE (PF) 2 MG/ML IV SOLN: 0.5000 mg | INTRAVENOUS | Status: DC | PRN

## 2018-08-16 MED ORDER — METOCLOPRAMIDE HCL 5 MG/ML IJ SOLN: 5.0000 mg | Freq: Three times a day (TID) | INTRAMUSCULAR | Status: DC | PRN

## 2018-08-16 MED ORDER — MIDAZOLAM HCL 2 MG/2ML IJ SOLN
INTRAMUSCULAR | Status: AC
  Administered 2018-08-16: 2 mg via INTRAVENOUS
  Filled 2018-08-16: qty 2

## 2018-08-16 MED ORDER — LIDOCAINE 20MG/ML (2%) 15 ML SYRINGE OPTIME
INTRAMUSCULAR | Status: DC | PRN
  Administered 2018-08-16: 100 mg via INTRAVENOUS

## 2018-08-16 MED ORDER — BUPIVACAINE HCL (PF) 0.5 % IJ SOLN
INTRAMUSCULAR | Status: DC | PRN
  Administered 2018-08-16: 15 mL via PERINEURAL

## 2018-08-16 MED ORDER — MENTHOL 3 MG MT LOZG: 1.0000 | LOZENGE | OROMUCOSAL | Status: DC | PRN

## 2018-08-16 MED ORDER — FENTANYL CITRATE (PF) 100 MCG/2ML IJ SOLN
INTRAMUSCULAR | Status: DC | PRN
  Administered 2018-08-16: 75 ug via INTRAVENOUS
  Administered 2018-08-16: 25 ug via INTRAVENOUS

## 2018-08-16 MED ORDER — ROCURONIUM BROMIDE 10 MG/ML (PF) SYRINGE
PREFILLED_SYRINGE | INTRAVENOUS | Status: DC | PRN
  Administered 2018-08-16: 50 mg via INTRAVENOUS

## 2018-08-16 MED ORDER — LATANOPROST 0.005 % OP SOLN
1.0000 [drp] | Freq: Every day | OPHTHALMIC | Status: DC
  Administered 2018-08-16: 1 [drp] via OPHTHALMIC
  Filled 2018-08-16: qty 2.5

## 2018-08-16 MED ORDER — ACETAMINOPHEN 500 MG PO TABS
1000.0000 mg | ORAL_TABLET | Freq: Once | ORAL | Status: AC
  Administered 2018-08-16: 1000 mg via ORAL

## 2018-08-16 MED ORDER — POLYETHYL GLYCOL-PROPYL GLYCOL 0.4-0.3 % OP GEL: 2.0000 "application " | Freq: Two times a day (BID) | OPHTHALMIC | Status: DC | PRN

## 2018-08-16 MED ORDER — SODIUM CHLORIDE 0.9 % IR SOLN
Status: DC | PRN
  Administered 2018-08-16: 3000 mL

## 2018-08-16 MED ORDER — PROPOFOL 10 MG/ML IV BOLUS
INTRAVENOUS | Status: AC
  Filled 2018-08-16: qty 20

## 2018-08-16 MED ORDER — FLEET ENEMA 7-19 GM/118ML RE ENEM: 1.0000 | ENEMA | Freq: Once | RECTAL | Status: DC | PRN

## 2018-08-16 MED ORDER — PROPOFOL 10 MG/ML IV BOLUS
INTRAVENOUS | Status: DC | PRN
  Administered 2018-08-16: 100 mg via INTRAVENOUS

## 2018-08-16 MED ORDER — HYDROCODONE-ACETAMINOPHEN 7.5-325 MG PO TABS: 1.0000 | ORAL_TABLET | ORAL | Status: DC | PRN

## 2018-08-16 MED ORDER — MIDAZOLAM HCL 2 MG/2ML IJ SOLN
2.0000 mg | Freq: Once | INTRAMUSCULAR | Status: AC
  Administered 2018-08-16: 2 mg via INTRAVENOUS

## 2018-08-16 MED ORDER — CHLORHEXIDINE GLUCONATE 4 % EX LIQD: 60.0000 mL | Freq: Once | CUTANEOUS | Status: DC

## 2018-08-16 MED ORDER — FENTANYL CITRATE (PF) 100 MCG/2ML IJ SOLN: 25.0000 ug | INTRAMUSCULAR | Status: DC | PRN

## 2018-08-16 SURGICAL SUPPLY — 81 items
AID PSTN UNV HD RSTRNT DISP (MISCELLANEOUS) ×1
BASEPLATE P2 COATD GLND 6.5X30 (Shoulder) IMPLANT
BIT DRILL 2.5 DIA 127 CALI (BIT) ×2 IMPLANT
BIT DRILL 4 DIA CALIBRATED (BIT) ×2 IMPLANT
BIT DRILL 5/64X5 DISP (BIT) ×3 IMPLANT
BLADE SAW SAG 73X25 THK (BLADE) ×2
BLADE SAW SGTL 73X25 THK (BLADE) ×1 IMPLANT
BLADE SURG 15 STRL LF DISP TIS (BLADE) ×1 IMPLANT
BLADE SURG 15 STRL SS (BLADE) ×3
BSPLAT GLND 30 STRL LF SHLDR (Shoulder) ×1 IMPLANT
CHLORAPREP W/TINT 26ML (MISCELLANEOUS) ×5 IMPLANT
CLOSURE STERI-STRIP 1/4X4 (GAUZE/BANDAGES/DRESSINGS) ×2 IMPLANT
CLOSURE WOUND 1/2 X4 (GAUZE/BANDAGES/DRESSINGS)
COVER SURGICAL LIGHT HANDLE (MISCELLANEOUS) ×3 IMPLANT
COVER WAND RF STERILE (DRAPES) ×3 IMPLANT
DRAPE INCISE IOBAN 66X45 STRL (DRAPES) ×3 IMPLANT
DRAPE ORTHO SPLIT 77X108 STRL (DRAPES) ×6
DRAPE SURG 17X23 STRL (DRAPES) ×3 IMPLANT
DRAPE SURG ORHT 6 SPLT 77X108 (DRAPES) ×2 IMPLANT
DRAPE U-SHAPE 47X51 STRL (DRAPES) ×3 IMPLANT
DRSG AQUACEL AG ADV 3.5X 6 (GAUZE/BANDAGES/DRESSINGS) ×2 IMPLANT
DRSG AQUACEL AG ADV 3.5X10 (GAUZE/BANDAGES/DRESSINGS) IMPLANT
ELECT BLADE 4.0 EZ CLEAN MEGAD (MISCELLANEOUS)
ELECT REM PT RETURN 9FT ADLT (ELECTROSURGICAL) ×3
ELECTRODE BLDE 4.0 EZ CLN MEGD (MISCELLANEOUS) IMPLANT
ELECTRODE REM PT RTRN 9FT ADLT (ELECTROSURGICAL) ×1 IMPLANT
GLOVE BIO SURGEON STRL SZ 6.5 (GLOVE) ×2 IMPLANT
GLOVE BIO SURGEON STRL SZ7 (GLOVE) ×3 IMPLANT
GLOVE BIO SURGEON STRL SZ7.5 (GLOVE) ×3 IMPLANT
GLOVE BIO SURGEONS STRL SZ 6.5 (GLOVE) ×2
GLOVE BIOGEL PI IND STRL 7.0 (GLOVE) ×1 IMPLANT
GLOVE BIOGEL PI IND STRL 8 (GLOVE) ×1 IMPLANT
GLOVE BIOGEL PI INDICATOR 7.0 (GLOVE) ×6
GLOVE BIOGEL PI INDICATOR 8 (GLOVE) ×2
GOWN STRL REUS W/ TWL LRG LVL3 (GOWN DISPOSABLE) ×1 IMPLANT
GOWN STRL REUS W/ TWL XL LVL3 (GOWN DISPOSABLE) ×1 IMPLANT
GOWN STRL REUS W/TWL LRG LVL3 (GOWN DISPOSABLE) ×9
GOWN STRL REUS W/TWL XL LVL3 (GOWN DISPOSABLE) ×3
HANDPIECE INTERPULSE COAX TIP (DISPOSABLE) ×3
HEMOSTAT SURGICEL 2X14 (HEMOSTASIS) ×3 IMPLANT
HOOD PEEL AWAY FLYTE STAYCOOL (MISCELLANEOUS) ×6 IMPLANT
INSERT HUMERAL SOCKET HXE+ 32 (Insert) ×2 IMPLANT
KIT BASIN OR (CUSTOM PROCEDURE TRAY) ×3 IMPLANT
KIT TURNOVER KIT B (KITS) ×3 IMPLANT
MANIFOLD NEPTUNE II (INSTRUMENTS) ×3 IMPLANT
NDL MAYO TROCAR (NEEDLE) ×1 IMPLANT
NEEDLE MAYO TROCAR (NEEDLE) ×3 IMPLANT
NS IRRIG 1000ML POUR BTL (IV SOLUTION) ×3 IMPLANT
P2 COATDE GLNOID BSEPLT 6.5X30 (Shoulder) ×3 IMPLANT
PACK SHOULDER (CUSTOM PROCEDURE TRAY) ×3 IMPLANT
PAD ARMBOARD 7.5X6 YLW CONV (MISCELLANEOUS) ×6 IMPLANT
RESTRAINT HEAD UNIVERSAL NS (MISCELLANEOUS) ×3 IMPLANT
RETRIEVER SUT HEWSON (MISCELLANEOUS) ×1 IMPLANT
SCREW BONE LOCKING RSP 5.0X14 (Screw) ×6 IMPLANT
SCREW BONE LOCKING RSP 5.0X30 (Screw) ×3 IMPLANT
SCREW BONE RSP LOCK 5X14 (Screw) IMPLANT
SCREW BONE RSP LOCK 5X26 (Screw) IMPLANT
SCREW BONE RSP LOCK 5X30 (Screw) IMPLANT
SCREW BONE RSP LOCKING 5.0X26 (Screw) ×3 IMPLANT
SCREW RETAIN W/HEAD 4MM OFFSET (Shoulder) ×2 IMPLANT
SET HNDPC FAN SPRY TIP SCT (DISPOSABLE) ×1 IMPLANT
SHELL HUMERAL STEM 6X48 SM (Shell) ×2 IMPLANT
SLING ARM FOAM STRAP LRG (SOFTGOODS) ×3 IMPLANT
SLING ARM FOAM STRAP MED (SOFTGOODS) IMPLANT
SMARTMIX MINI TOWER (MISCELLANEOUS) ×3
SPONGE LAP 18X18 X RAY DECT (DISPOSABLE) ×3 IMPLANT
SPONGE LAP 4X18 RFD (DISPOSABLE) IMPLANT
STRIP CLOSURE SKIN 1/2X4 (GAUZE/BANDAGES/DRESSINGS) ×1 IMPLANT
SUCTION FRAZIER HANDLE 10FR (MISCELLANEOUS) ×2
SUCTION TUBE FRAZIER 10FR DISP (MISCELLANEOUS) ×1 IMPLANT
SUPPORT WRAP ARM LG (MISCELLANEOUS) ×3 IMPLANT
SUT ETHIBOND NAB CT1 #1 30IN (SUTURE) ×9 IMPLANT
SUT FIBERWIRE #2 38 T-5 BLUE (SUTURE)
SUT MNCRL AB 4-0 PS2 18 (SUTURE) ×3 IMPLANT
SUT VIC AB 2-0 CT1 27 (SUTURE) ×3
SUT VIC AB 2-0 CT1 TAPERPNT 27 (SUTURE) ×1 IMPLANT
SUTURE FIBERWR #2 38 T-5 BLUE (SUTURE) IMPLANT
TAPE LABRALWHITE 1.5X36 (TAPE) ×3 IMPLANT
TAPE SUT LABRALTAP WHT/BLK (SUTURE) ×3 IMPLANT
TOWEL OR 17X26 10 PK STRL BLUE (TOWEL DISPOSABLE) ×3 IMPLANT
TOWER SMARTMIX MINI (MISCELLANEOUS) ×1 IMPLANT

## 2018-08-16 NOTE — Plan of Care (Signed)
  Problem: Education: Goal: Knowledge of General Education information will improve Description Including pain rating scale, medication(s)/side effects and non-pharmacologic comfort measures Outcome: Progressing   Problem: Clinical Measurements: Goal: Diagnostic test results will improve Outcome: Progressing   Problem: Activity: Goal: Risk for activity intolerance will decrease Outcome: Progressing   Problem: Coping: Goal: Level of anxiety will decrease Outcome: Progressing   Problem: Elimination: Goal: Will not experience complications related to bowel motility Outcome: Progressing   Problem: Pain Managment: Goal: General experience of comfort will improve Outcome: Progressing   Problem: Safety: Goal: Ability to remain free from injury will improve Outcome: Progressing

## 2018-08-16 NOTE — H&P (Signed)
[name] is an 68 y.o. female.   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  Pain interferes with sleep and quality of life.   Past Medical History:  Diagnosis Date  . Acid reflux   . Arthritis   . Asthma   . Hypertension   . Lupus ([place]) 07/13/2011  . 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 PONV (postoperative nausea and vomiting)   . Stroke [facility])    lt side weakness    Past Surgical History:  Procedure Laterality Date  . ABDOMINAL HYSTERECTOMY    . BACK SURGERY    . CHOLECYSTECTOMY    . COLONOSCOPY    . FOOT SURGERY     x2  . MASS EXCISION     back  . rortator     rotator cuff    Family History  Problem Relation Age of Onset  . Lung disease Father   . Coronary artery disease Brother    Social History:  reports that she quit smoking about 24 years ago. She has a 10.00 pack-year smoking history. She has never used smokeless tobacco. She reports previous alcohol use. She reports that she does not use drugs.  Allergies:  Allergies  Allergen Reactions  . Iodinated Diagnostic Agents Other (See Comments)    Kidney failure.   . Ciprofloxacin Itching  . E-Mycin [Erythromycin Base] Nausea And Vomiting  . Oxycodone Other (See Comments)    Hallucination.    Medications Prior to Admission  Medication Sig Dispense Refill  . aspirin EC 81 MG tablet Take 81 mg by mouth daily.    . Biotin 1000 MCG tablet Take 1,000 mcg by mouth daily.    . Calcium Carb-Cholecalciferol (CALCIUM 600/VITAMIN D3 PO) Take 1 tablet by mouth daily.    . cholecalciferol (VITAMIN D) 1000 UNITS tablet Take 1,000 Units by mouth daily.     . furosemide (LASIX) 80 MG tablet Take 40 mg by mouth daily.     . hydroxychloroquine (PLAQUENIL) 200 MG tablet Take 400 mg by mouth daily.     Marland Kitchen latanoprost (XALATAN) 0.005 % ophthalmic solution Place 1 drop into both  eyes at bedtime.    Marland Kitchen losartan (COZAAR) 100 MG tablet Take 100 mg by mouth daily.    . Multiple Vitamin (MULTIVITAMIN) tablet Take 1 tablet by mouth daily.    Vladimir Faster Glycol-Propyl Glycol (SYSTANE) 0.4-0.3 % GEL ophthalmic gel Place 2 application into both eyes 2 (two) times daily as needed (for dry eyes).    . potassium chloride SA (K-DUR,KLOR-CON) 20 MEQ tablet Take 2 tablets (40 mEq total) by mouth 2 (two) times daily.    Marland Kitchen albuterol (PROVENTIL HFA;VENTOLIN HFA) 108 (90 Base) MCG/ACT inhaler Inhale 2 puffs into the lungs every 6 (six) hours as needed for wheezing or shortness of breath.    . budesonide-formoterol (SYMBICORT) 160-4.5 MCG/ACT inhaler Inhale 2 puffs into the lungs 2 (two) times daily as needed (for shortness of breath or wheezing).      No results found for this or any previous visit (from the past 48 hour(s)). No results found.  Review of Systems  All other systems reviewed and are negative.   Blood pressure (!) 172/72, pulse 81, temperature 98.5 F (36.9 C), temperature source Oral, resp. rate 18, height 5\' 6"  (1.676 m), weight 93.9 kg, SpO2 99 %. Physical Exam  Constitutional: She is oriented to person, place, and time.  She appears well-developed and well-nourished.  HENT:  Head: Atraumatic.  Eyes: EOM are normal.  Cardiovascular: Intact distal pulses.  Respiratory: Effort normal.  Musculoskeletal:     Comments: L shoulder pain with limited ROM. NVID.  Neurological: She is alert and oriented to person, place, and time.  Skin: Skin is warm and dry.  Psychiatric: She has a normal mood and affect.     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  Pain interferes with sleep and quality of life. Plan L reverse TSA Risks / benefits of surgery discussed Consent on chart  NPO for OR Preop antibiotics   Isabella Stalling, MD 08/16/2018, 9:34 AM

## 2018-08-16 NOTE — Anesthesia Procedure Notes (Signed)
Anesthesia Regional Block: Interscalene brachial plexus block   Pre-Anesthetic Checklist: ,, timeout performed, Correct Patient, Correct Site, Correct Laterality, Correct Procedure, Correct Position, site marked, Risks and benefits discussed,  Surgical consent,  Pre-op evaluation,  At surgeon's request and post-op pain management  Laterality: Left  Prep: Maximum Sterile Barrier Precautions used, chloraprep       Needles:  Injection technique: Single-shot  Needle Type: Echogenic Stimulator Needle     Needle Length: 4cm  Needle Gauge: 22     Additional Needles:   Procedures:,,,, ultrasound used (permanent image in chart),,,,  Narrative:  Start time: 08/16/2018 9:33 AM End time: 08/16/2018 9:43 AM Injection made incrementally with aspirations every 5 mL.  Performed by: Personally  Anesthesiologist: Freddrick March, MD  Additional Notes: Monitors applied. No increased pain on injection. No increased resistance to injection. Injection made in 5cc increments. Good needle visualization. Patient tolerated procedure well.

## 2018-08-16 NOTE — Op Note (Signed)
Procedure(s): LEFT SHOULDER REVERSE TOTAL SHOULDER ARTHROPLASTY AND BICEPS TENOTOMY Procedure Note  [name] female 68 y.o. 08/16/2018  Procedure(s) and Anesthesia Type:    * LEFT SHOULDER REVERSE TOTAL SHOULDER ARTHROPLASTY - Choice   Indications:  68 y.o. female  With with irrepairable rotator cuff tear and moderate arthropathy. Pain and dysfunction interfered with quality of life and nonoperative treatment with activity modification, NSAIDS and injections failed.     Surgeon: Isabella Stalling   Assistants: RNFA  Anesthesia: General endotracheal anesthesia with preoperative interscalene block given by the attending anesthesiologist    Procedure Detail  LEFT SHOULDER REVERSE TOTAL SHOULDER ARTHROPLASTY AND BICEPS TENOTOMY   Estimated Blood Loss:  200 mL         Drains: none  Blood Given: none          Specimens: none        Complications:  * No complications entered in OR log *         Disposition: PACU - hemodynamically stable.         Condition: stable      OPERATIVE FINDINGS:  A DJO Altivate pressfit reverse total shoulder arthroplasty was placed with a  size 6 short stem, a 32-4 glenosphere, and a standard poly insert. The base plate  fixation was excellent.  PROCEDURE: The patient was identified in the preoperative holding area  where I personally marked the operative site after verifying site, side,  and procedure with the patient. An interscalene block given by  the attending anesthesiologist in the holding area and the patient was taken back to the operating room where all extremities were  carefully padded in position after general anesthesia was induced. She  was placed in a beach-chair position and the operative upper extremity was  prepped and draped in a standard sterile fashion. An approximately 10-  cm incision was made from the tip of the coracoid process to the center  point of the humerus at the level of the axilla. Dissection was  carried  down through subcutaneous tissues to the level of the cephalic vein  which was taken laterally with the deltoid. The pectoralis major was  retracted medially. The subdeltoid space was developed and the lateral  edge of the conjoined tendon was identified. The undersurface of  conjoined tendon was palpated and the musculocutaneous nerve was not in  the field. Retractor was placed underneath the conjoined and second  retractor was placed lateral into the deltoid. The circumflex humeral  artery and vessels were identified and clamped and coagulated. The  biceps tendon was severely degenerated and was tenotomized.  The subscapularis was taken down as a peel with the underlying capsule.  The  joint was then gently externally rotated while the capsule was released  from the humeral neck around to just beyond the 6 o'clock position. At  this point, the joint was dislocated and the humeral head was presented  into the wound. The excessive osteophyte formation was removed with a  large rongeur.  The cutting guide was used to make the appropriate  head cut and the head was saved for potentially bone grafting.  The glenoid was exposed with the arm in an  abducted extended position. The anterior and posterior labrum were  completely excised and the capsule was released circumferentially to  allow for exposure of the glenoid for preparation. The 2.5 mm drill was  placed using the guide in 5-10 inferior angulation and the tap was then advanced in the  same hole. Small and large reamers were then used. The tap was then removed and the Metaglene was then screwed in with excellent purchase.  The peripheral guide was then used to drilled measured and filled peripheral locking screws. The size  32-4  glenosphere was then impacted on the [facility] taper and the central screw was placed. The humerus was then again exposed and the diaphyseal reamers were used followed by the metaphyseal reamers. The final broach  was left in place in the proximal trial was placed. The joint was reduced and initially was bit tight.  The metaphysis was reamed slightly deeper and with this implant it was felt that soft tissue tensioning was appropriate with excellent stability and excellent range of motion. Therefore, final humeral stem was placed pressfit with bonegrafting.  And then the trial polyethylene inserts were tested again and the above implant was felt to be the most appropriate for final insertion. The joint was reduced taken through full range of motion and felt to be stable. Soft tissue tension was appropriate.  The joint was then copiously irrigated with pulse  lavage and the wound was then closed. The subscapularis was loosely repaired with 2 fiberwire.  Skin was closed with 2-0 Vicryl in a deep dermal layer and 4-0  Monocryl for skin closure. Steri-Strips were applied. Sterile  dressings were then applied as well as a sling. The patient was allowed  to awaken from general anesthesia, transferred to stretcher, and taken  to recovery room in stable condition.   POSTOPERATIVE PLAN: The patient will be kept in the hospital postoperatively  for pain control and therapy.

## 2018-08-16 NOTE — Transfer of Care (Signed)
Immediate Anesthesia Transfer of Care Note  Patient: [name]  Procedure(s) Performed: LEFT SHOULDER REVERSE TOTAL SHOULDER ARTHROPLASTY AND BICEPS TENOTOMY (Left )  Patient Location: PACU  Anesthesia Type:General  Level of Consciousness: awake, drowsy and patient cooperative  Airway & Oxygen Therapy: Patient Spontanous Breathing and Patient connected to nasal cannula oxygen  Post-op Assessment: Report given to RN and Post -op Vital signs reviewed and stable  Post vital signs: Reviewed and stable  Last Vitals:  Vitals Value Taken Time  BP    Temp 36.4 C 08/16/2018 12:47 PM  Pulse 81 08/16/2018 12:47 PM  Resp 18 08/16/2018 12:48 PM  SpO2 100 % 08/16/2018 12:47 PM  Vitals shown include unvalidated device data.  Last Pain:  Vitals:   08/16/18 0828  TempSrc:   PainSc: 0-No pain         Complications: No apparent anesthesia complications

## 2018-08-16 NOTE — Anesthesia Procedure Notes (Signed)
Procedure Name: Intubation Date/Time: 08/16/2018 10:22 AM Performed by: Lowella Dell, CRNA Pre-anesthesia Checklist: Patient identified, Emergency Drugs available, Suction available and Patient being monitored Patient Re-evaluated:Patient Re-evaluated prior to induction Oxygen Delivery Method: Circle System Utilized Preoxygenation: Pre-oxygenation with 100% oxygen Induction Type: IV induction Ventilation: Mask ventilation without difficulty and Oral airway inserted - appropriate to patient size Laryngoscope Size: Mac and 3 Grade View: Grade I Tube type: Oral Number of attempts: 1 Airway Equipment and Method: Stylet Placement Confirmation: ETT inserted through vocal cords under direct vision,  positive ETCO2 and breath sounds checked- equal and bilateral Secured at: 20 cm Tube secured with: Tape Dental Injury: Teeth and Oropharynx as per pre-operative assessment

## 2018-08-16 NOTE — Discharge Instructions (Signed)
Discharge Instructions after Reverse Total Shoulder Arthroplasty ° ° °A sling has been provided for you. You are to where this at all times, even while sleeping, until your first post operative visit with Dr. Samyrah Bruster. °Use ice on the shoulder intermittently over the first 48 hours after surgery.  °Pain medicine has been prescribed for you.  °Use your medicine liberally over the first 48 hours, and then you can begin to taper your use. You may take Extra Strength Tylenol or Tylenol only in place of the pain pills. DO NOT take ANY nonsteroidal anti-inflammatory pain medications: Advil, Motrin, Ibuprofen, Aleve, Naproxen or Naprosyn.  °Take one aspirin a day for 2 weeks after surgery, unless you have an aspirin sensitivity/allergy or asthma.  °Leave your dressing on until your first follow up visit.  You may shower with the dressing.  Hold your arm as if you still have your sling on while you shower. °Simply allow the water to wash over the site and then pat dry. Make sure your axilla (armpit) is completely dry after showering. ° ° ° °Please call [phone] during normal business hours or [phone] after hours for any problems. Including the following: ° °- excessive redness of the incisions °- drainage for more than 4 days °- fever of more than 101.5 F ° °*Please note that pain medications will not be refilled after hours or on weekends. ° ° ° ° °

## 2018-08-17 ENCOUNTER — Encounter (HOSPITAL_COMMUNITY): Payer: Self-pay | Admitting: Orthopedic Surgery

## 2018-08-17 DIAGNOSIS — Z23 Encounter for immunization: Secondary | ICD-10-CM | POA: Diagnosis not present

## 2018-08-17 LAB — CBC
HCT: 33.4 % — ABNORMAL LOW (ref 36.0–46.0)
Hemoglobin: 10.5 g/dL — ABNORMAL LOW (ref 12.0–15.0)
MCH: 29.7 pg (ref 26.0–34.0)
MCHC: 31.4 g/dL (ref 30.0–36.0)
MCV: 94.6 fL (ref 80.0–100.0)
Platelets: 131 10*3/uL — ABNORMAL LOW (ref 150–400)
RBC: 3.53 MIL/uL — ABNORMAL LOW (ref 3.87–5.11)
RDW: 13.2 % (ref 11.5–15.5)
WBC: 8.7 10*3/uL (ref 4.0–10.5)
nRBC: 0 % (ref 0.0–0.2)

## 2018-08-17 LAB — BASIC METABOLIC PANEL
Anion gap: 7 (ref 5–15)
BUN: 10 mg/dL (ref 8–23)
CO2: 21 mmol/L — ABNORMAL LOW (ref 22–32)
Calcium: 8.5 mg/dL — ABNORMAL LOW (ref 8.9–10.3)
Chloride: 112 mmol/L — ABNORMAL HIGH (ref 98–111)
Creatinine, Ser: 0.88 mg/dL (ref 0.44–1.00)
GFR calc Af Amer: >60 mL/min (ref >60)
Glucose, Bld: 119 mg/dL — ABNORMAL HIGH (ref 70–99)
Potassium: 3.9 mmol/L (ref 3.5–5.1)
Sodium: 140 mmol/L (ref 135–145)

## 2018-08-17 MED ORDER — HYDROCODONE-ACETAMINOPHEN 5-325 MG PO TABS: 1.0000 | ORAL_TABLET | Freq: Four times a day (QID) | ORAL | 0 refills | Status: DC | PRN

## 2018-08-17 NOTE — Discharge Summary (Signed)
Patient ID: [name] MRN: [id number] DOB/AGE: [birth year] 68 y.o.  Admit date: 08/16/2018 Discharge date: 08/17/2018  Admission Diagnoses:  Active Problems:   S/P shoulder replacement, left   Discharge Diagnoses:  Same  Past Medical History:  Diagnosis Date  . Acid reflux   . Arthritis   . Asthma   . Hypertension   . Lupus ([place]) 07/13/2011  . 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 PONV (postoperative nausea and vomiting)   . Stroke [facility])    lt side weakness    Surgeries: Procedure(s): LEFT SHOULDER REVERSE TOTAL SHOULDER ARTHROPLASTY AND BICEPS TENOTOMY on 08/16/2018   Consultants:   Discharged Condition: Improved  Hospital Course: [name] is an 68 y.o. female who was admitted 08/16/2018 for operative treatment of L shoulder pain and dysfunction. Patient has severe unremitting pain that affects sleep, daily activities, and work/hobbies. After pre-op clearance the patient was taken to the operating room on 08/16/2018 and underwent  Procedure(s): LEFT SHOULDER REVERSE TOTAL SHOULDER ARTHROPLASTY AND BICEPS TENOTOMY.    Patient was given perioperative antibiotics:  Anti-infectives (From admission, onward)   Start     Dose/Rate Route Frequency Ordered Stop   08/17/18 1000  hydroxychloroquine (PLAQUENIL) tablet 400 mg     400 mg Oral Daily 08/16/18 1356     08/16/18 1630  ceFAZolin (ANCEF) IVPB 2g/100 mL premix     2 g 200 mL/hr over 30 Minutes Intravenous Every 6 hours 08/16/18 1356 08/17/18 0449   08/16/18 0815  ceFAZolin (ANCEF) IVPB 2g/100 mL premix     2 g 200 mL/hr over 30 Minutes Intravenous On call to O.R. 08/16/18 7782 08/16/18 1030   08/16/18 0812  ceFAZolin (ANCEF) 2-4 GM/100ML-% IVPB    Note to Pharmacy:  Lisette Grinder   : cabinet override      08/16/18 0812 08/16/18 1030       Patient was given sequential compression devices, early ambulation, and chemoprophylaxis to prevent DVT.  Patient benefited maximally from hospital stay  and there were no complications.    Recent vital signs:  Patient Vitals for the past 24 hrs:  BP Temp Temp src Pulse Resp SpO2 Height  08/17/18 0904 (!) 147/54 98.3 F (36.8 C) Oral 85 - 99 % -  08/17/18 0433 (!) 144/65 98.3 F (36.8 C) Oral 84 16 94 % -  08/16/18 2205 (!) 167/65 98.1 F (36.7 C) Oral 89 16 99 % -  08/16/18 1416 (!) 126/46 (!) 96.8 F (36 C) Rectal 69 - 97 % 5\' 6"  (1.676 m)  08/16/18 1348 (!) 148/60 (!) 97.5 F (36.4 C) - 67 17 99 % -  08/16/18 1333 135/67 - - 68 11 99 % -  08/16/18 1320 (!) 164/69 - - 68 14 99 % -  08/16/18 1302 (!) 141/66 - - 69 16 99 % -  08/16/18 1249 140/67 - - 75 16 99 % -  08/16/18 1247 140/67 (!) 97.5 F (36.4 C) - 83 19 97 % -     Recent laboratory studies:  Recent Labs    08/17/18 0628  WBC 8.7  HGB 10.5*  HCT 33.4*  PLT 131*  NA 140  K 3.9  CL 112*  CO2 21*  BUN 10  CREATININE 0.88  GLUCOSE 119*  CALCIUM 8.5*     Discharge Medications:   Allergies as of 08/17/2018      Reactions   Iodinated Diagnostic Agents Other (See Comments)   Kidney failure.    Ciprofloxacin Itching  E-mycin [erythromycin Base] Nausea And Vomiting   Oxycodone Other (See Comments)   Hallucination.      Medication List    TAKE these medications   albuterol 108 (90 Base) MCG/ACT inhaler Commonly known as:  PROVENTIL HFA;VENTOLIN HFA Inhale 2 puffs into the lungs every 6 (six) hours as needed for wheezing or shortness of breath.   aspirin EC 81 MG tablet Take 81 mg by mouth daily.   Biotin 1000 MCG tablet Take 1,000 mcg by mouth daily.   budesonide-formoterol 160-4.5 MCG/ACT inhaler Commonly known as:  SYMBICORT Inhale 2 puffs into the lungs 2 (two) times daily as needed (for shortness of breath or wheezing).   CALCIUM 600/VITAMIN D3 PO Take 1 tablet by mouth daily.   cholecalciferol 1000 units tablet Commonly known as:  VITAMIN D Take 1,000 Units by mouth daily.   furosemide 80 MG tablet Commonly known as:  LASIX Take 40 mg  by mouth daily.   HYDROcodone-acetaminophen 5-325 MG tablet Commonly known as:  NORCO/VICODIN Take 1 tablet by mouth every 6 (six) hours as needed for moderate pain (pain score 4-6).   hydroxychloroquine 200 MG tablet Commonly known as:  PLAQUENIL Take 400 mg by mouth daily.   latanoprost 0.005 % ophthalmic solution Commonly known as:  XALATAN Place 1 drop into both eyes at bedtime.   losartan 100 MG tablet Commonly known as:  COZAAR Take 100 mg by mouth daily.   multivitamin tablet Take 1 tablet by mouth daily.   potassium chloride SA 20 MEQ tablet Commonly known as:  K-DUR,KLOR-CON Take 2 tablets (40 mEq total) by mouth 2 (two) times daily.   SYSTANE 0.4-0.3 % Gel ophthalmic gel Generic drug:  Polyethyl Glycol-Propyl Glycol Place 2 application into both eyes 2 (two) times daily as needed (for dry eyes).       Diagnostic Studies: Dg Shoulder Left Port  Result Date: 08/16/2018 CLINICAL DATA:  Left shoulder replacement. EXAM: LEFT SHOULDER - 1 VIEW COMPARISON:  05/01/2018 FINDINGS: Evidence patient's recent left shoulder replacement with acetabular and humeral prosthetic components intact and normally located. No fracture or dislocation. Mild degenerate change of the [facility] joint. IMPRESSION: Expected changes post left shoulder replacement. Electronically Signed   By: Marin Olp M.D.   On: 08/16/2018 14:36    Disposition: Discharge disposition: 01-Home or Self Care       Discharge Instructions    Call MD / Call 911   Complete by:  As directed    If you experience chest pain or shortness of breath, CALL 911 and be transported to the hospital emergency room.  If you develope a fever above 101 F, pus (white drainage) or increased drainage or redness at the wound, or calf pain, call your surgeon's office.   Constipation Prevention   Complete by:  As directed    Drink plenty of fluids.  Prune juice may be helpful.  You may use a stool softener, such as Colace (over the  counter) 100 mg twice a day.  Use MiraLax (over the counter) for constipation as needed.   Diet - low sodium heart healthy   Complete by:  As directed    Driving restrictions   Complete by:  As directed    No driving for 6 weeks   Increase activity slowly as tolerated   Complete by:  As directed       Follow-up Information    Tania Ade, MD. Schedule an appointment as soon as possible for a visit in 2 weeks.  Specialty:  Orthopedic Surgery Contact information: [place] [address] [phone]            Signed: Isabella Stalling 08/17/2018, 11:24 AM

## 2018-08-17 NOTE — Anesthesia Postprocedure Evaluation (Signed)
Anesthesia Post Note  Patient: [name]  Procedure(s) Performed: LEFT SHOULDER REVERSE TOTAL SHOULDER ARTHROPLASTY AND BICEPS TENOTOMY (Left )     Patient location during evaluation: PACU Anesthesia Type: Regional and General Level of consciousness: awake and alert Pain management: pain level controlled Vital Signs Assessment: post-procedure vital signs reviewed and stable Respiratory status: spontaneous breathing, nonlabored ventilation, respiratory function stable and patient connected to nasal cannula oxygen Cardiovascular status: blood pressure returned to baseline and stable Postop Assessment: no apparent nausea or vomiting Anesthetic complications: no    Last Vitals:  Vitals:   08/16/18 2205 08/17/18 0433  BP: (!) 167/65 (!) 144/65  Pulse: 89 84  Resp: 16 16  Temp: 36.7 C 36.8 C  SpO2: 99% 94%    Last Pain:  Vitals:   08/17/18 0433  TempSrc: Oral  PainSc:                  Eugen Jeansonne L Byren Pankow

## 2018-08-17 NOTE — Progress Notes (Signed)
   PATIENT ID: [name]   1 Day Post-Op Procedure(s) (LRB): LEFT SHOULDER REVERSE TOTAL SHOULDER ARTHROPLASTY AND BICEPS TENOTOMY (Left)  Subjective: Doing well, no complaints.  No pain.  Objective:  Vitals:   08/16/18 2205 08/17/18 0433  BP: (!) 167/65 (!) 144/65  Pulse: 89 84  Resp: 16 16  Temp: 98.1 F (36.7 C) 98.3 F (36.8 C)  SpO2: 99% 94%     Left shoulder dressing clean dry and intact.  She is able to wiggle her fingers without difficulty.  Labs:  Recent Labs    08/17/18 0628  HGB 10.5*   Recent Labs    08/17/18 0628  WBC 8.7  RBC 3.53*  HCT 33.4*  PLT 131*   Recent Labs    08/17/18 0628  NA 140  K 3.9  CL 112*  CO2 21*  BUN 10  CREATININE 0.88  GLUCOSE 119*  CALCIUM 8.5*    Assessment and Plan:Postop day 1 status post left reverse total shoulder arthroplasty Doing well, she will work with OT today and then discharged home. Follow-up 2 weeks.  Standard precautions were reviewed.  VTE proph: Aspirin

## 2018-08-17 NOTE — Evaluation (Signed)
Occupational Therapy Evaluation and Discharge Patient Details Name: [name] MRN: [id number] DOB: 03/24/1951 Today's Date: 08/17/2018    History of Present Illness Pt is a 68 y/o female s/p TSA    Clinical Impression   PTA Pt independent in ADL and mobility. Pt educated on sling management, shower safety and compensatory strategies, dressing (sequencing and good clothing choices), HEP for elbow wrist and hand. Pt dressed and verbalized/demonstrated understanding of all shoulder education. OT to sign off acutely and please continue therapy as ordered by MD at follow up.     Follow Up Recommendations  Follow surgeon's recommendation for DC plan and follow-up therapies;Supervision/Assistance - 24 hour(initially)    Equipment Recommendations  None recommended by OT    Recommendations for Other Services       Precautions / Restrictions Precautions Precautions: Shoulder Type of Shoulder Precautions: conservative Shoulder Interventions: Shoulder sling/immobilizer;At all times;Off for dressing/bathing/exercises Precaution Booklet Issued: Yes (comment) Precaution Comments: shoulder handout reviewed in full Required Braces or Orthoses: Sling Restrictions Weight Bearing Restrictions: Yes LUE Weight Bearing: Non weight bearing      Mobility Bed Mobility Overal bed mobility: Independent                Transfers Overall transfer level: Independent                    Balance Overall balance assessment: No apparent balance deficits (not formally assessed)                                         ADL either performed or assessed with clinical judgement   ADL                                         General ADL Comments: see shoulder section beloe     Vision         Perception     Praxis      Pertinent Vitals/Pain Pain Assessment: 0-10 Pain Score: 1  Pain Location: L shoulder Pain Descriptors / Indicators:  Dull Pain Intervention(s): Monitored during session(block still in place)     Hand Dominance Right   Extremity/Trunk Assessment Upper Extremity Assessment Upper Extremity Assessment: LUE deficits/detail LUE Deficits / Details: post-op deficits as anticipated, block still in place LUE Sensation: decreased light touch LUE Coordination: decreased gross motor           Communication Communication Communication: No difficulties   Cognition Arousal/Alertness: Awake/alert Behavior During Therapy: WFL for tasks assessed/performed Overall Cognitive Status: Within Functional Limits for tasks assessed                                     General Comments       Exercises Exercises: Shoulder Shoulder Exercises Elbow Flexion: AROM;Left;Seated Elbow Extension: AROM;Left;Seated Wrist Flexion: AROM;Left Wrist Extension: AROM;Left Digit Composite Flexion: AROM;Left Composite Extension: AROM;Left Neck Flexion: AROM Neck Extension: AROM Neck Lateral Flexion - Right: AROM Neck Lateral Flexion - Left: AROM   Shoulder Instructions Shoulder Instructions Donning/doffing shirt without moving shoulder: Maximal assistance;Patient able to independently direct caregiver Method for sponge bathing under operated UE: Supervision/safety Donning/doffing sling/immobilizer: Maximal assistance;Patient able to independently direct caregiver Correct positioning of sling/immobilizer: Maximal assistance;Patient able  to independently direct caregiver ROM for elbow, wrist and digits of operated UE: Modified independent Sling wearing schedule (on at all times/off for ADL's): Independent Proper positioning of operated UE when showering: Supervision/safety Positioning of UE while sleeping: Minimal assistance;Patient able to independently direct caregiver    Home Living Family/patient expects to be discharged to:: Private residence Living Arrangements: Spouse/significant other Available Help at  Discharge: Family;Available 24 hours/day Type of Home: House Home Access: Stairs to enter [employer] of Steps: 2 Entrance Stairs-Rails: None Home Layout: One level     Bathroom Shower/Tub: Teacher, early years/pre: Standard     Home Equipment: None          Prior Functioning/Environment Level of Independence: Independent                 OT Problem List: Impaired UE functional use;Decreased knowledge of precautions      OT Treatment/Interventions:      OT Goals(Current goals can be found in the care plan section) Acute Rehab OT Goals Patient Stated Goal: get L arm working again OT Goal Formulation: With patient Time For Goal Achievement: 08/31/18 Potential to Achieve Goals: Good  OT Frequency:     Barriers to D/C:            Co-evaluation              AM-PAC OT "6 Clicks" Daily Activity     Outcome Measure Help from another person eating meals?: A Little Help from another person taking care of personal grooming?: A Little Help from another person toileting, which includes using toliet, bedpan, or urinal?: None Help from another person bathing (including washing, rinsing, drying)?: A Little Help from another person to put on and taking off regular upper body clothing?: A Lot Help from another person to put on and taking off regular lower body clothing?: A Little 6 Click Score: 18   End of Session Equipment Utilized During Treatment: Other (comment)(sling) Nurse Communication: Mobility status;Weight bearing status  Activity Tolerance: Patient tolerated treatment well Patient left: in chair;with call bell/phone within reach  OT Visit Diagnosis: Pain Pain - Right/Left: Left Pain - part of body: Shoulder                Time: 1610-9604 OT Time Calculation (min): 32 min Charges:  OT General Charges $OT Visit: 1 Visit OT Evaluation $OT Eval Moderate Complexity: 1 Mod OT Treatments $Self Care/Home Management : 8-22  mins  [name] OTR/L Acute Rehabilitation Services Pager: [phone] Office: [phone]  [name] [name] 08/17/2018, 4:42 PM

## 2018-08-24 DIAGNOSIS — N898 Other specified noninflammatory disorders of vagina: Secondary | ICD-10-CM | POA: Insufficient documentation

## 2018-08-28 ENCOUNTER — Encounter: Payer: Self-pay | Admitting: Cardiovascular Disease

## 2018-08-28 ENCOUNTER — Ambulatory Visit: Payer: Medicare HMO | Admitting: Cardiovascular Disease

## 2018-08-28 VITALS — BP 168/72 | HR 83 | Ht 66.0 in | Wt 202.6 lb

## 2018-08-28 DIAGNOSIS — I10 Essential (primary) hypertension: Secondary | ICD-10-CM

## 2018-08-28 DIAGNOSIS — I5032 Chronic diastolic (congestive) heart failure: Secondary | ICD-10-CM

## 2018-08-28 DIAGNOSIS — E6609 Other obesity due to excess calories: Secondary | ICD-10-CM | POA: Diagnosis not present

## 2018-08-28 DIAGNOSIS — Z6834 Body mass index (BMI) 34.0-34.9, adult: Secondary | ICD-10-CM

## 2018-08-28 DIAGNOSIS — R0602 Shortness of breath: Secondary | ICD-10-CM | POA: Diagnosis not present

## 2018-08-28 NOTE — Patient Instructions (Addendum)
Medication Instructions:  Your physician recommends that you continue on your current medications as directed. Please refer to the Current Medication list given to you today.  If you need a refill on your cardiac medications before your next appointment, please call your pharmacy.   Lab work: HAVE YOUR PRIMARY CARE COPY NEXT MONTH WHEN YOU HAVE LABS DONE   Testing/Procedures: NONE  Follow-Up: At [facility], you and your health needs are our priority.  As part of our continuing mission to provide you with exceptional heart care, we have created designated Provider Care Teams.  These Care Teams include your primary Cardiologist (physician) and Advanced Practice Providers (APPs -  Physician Assistants and Nurse Practitioners) who all work together to provide you with the care you need, when you need it. You will need a follow up appointment in 12 months.  Please call our office 2 months in advance to schedule this appointment.  You may see DR [facility] or one of the following Advanced Practice Providers on your designated Care Team:   Kerin Ransom, PA-C Roby Lofts, [place] . Sande Rives, PA-C

## 2018-08-28 NOTE — Progress Notes (Signed)
Cardiology Office Note   Date:  08/28/2018   ID:  [name], DOB 1950/09/09, MRN [id number]  PCP:  Merrilee Seashore, MD  Cardiologist:   Skeet Latch, MD   No chief complaint on file.    History of Present Illness: [name] is a 68 y.o. female with prior stroke, rheumatoid arthritis, lupus, interstitial lung disease, hypertension, prior lymphoplasmacytoid lymphoma, and asthma here for follow up.  She was initially seen in the ED 04/2018 with chest pain.  She reported L shoulder and chest wall pain.  EKG revealed diffuse T wave inversions.  Cardiac enzymes were negative x3.  She was referred for outpatient stress testing and had a Lexiscan Myoview 05/07/2018 that revealed LVEF 68%.  There was no ischemia.  She previously had an echo 01/2018 that revealed LVEF 60 to 65% with grade 2 diastolic dysfunction.  Since being discharged she has felt well.  She hasn't had any recurrent chest pain.   At her last appointment metoprolol was discontinued due to low blood pressure at home.  Since that time she has felt much better.  Her blood pressure has been in the 130s over 60s to 70s.  She has not experienced any chest pain.  She continues to have some exertional dyspnea.  Since her last appointment she had her left shoulder replaced.  This went well and without complications.  Overall she is doing well and without complaint at this time.   Past Medical History:  Diagnosis Date  . Acid reflux   . Arthritis   . Asthma   . Hypertension   . Lupus ([place]) 07/13/2011  . 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 PONV (postoperative nausea and vomiting)   . Stroke [facility])    lt side weakness    Past Surgical History:  Procedure Laterality Date  . ABDOMINAL HYSTERECTOMY    . BACK SURGERY    . CHOLECYSTECTOMY    . COLONOSCOPY    . FOOT SURGERY     x2  . MASS EXCISION     back  . rortator     rotator cuff  . TOTAL SHOULDER ARTHROPLASTY Left 08/16/2018   Procedure:  LEFT SHOULDER REVERSE TOTAL SHOULDER ARTHROPLASTY AND BICEPS TENOTOMY;  Surgeon: Tania Ade, MD;  Location: [place];  Service: Orthopedics;  Laterality: Left;     Current Outpatient Medications  Medication Sig Dispense Refill  . albuterol (PROVENTIL HFA;VENTOLIN HFA) 108 (90 Base) MCG/ACT inhaler Inhale 2 puffs into the lungs every 6 (six) hours as needed for wheezing or shortness of breath.    Marland Kitchen aspirin EC 81 MG tablet Take 81 mg by mouth daily.    . Biotin 1000 MCG tablet Take 1,000 mcg by mouth daily.    . budesonide-formoterol (SYMBICORT) 160-4.5 MCG/ACT inhaler Inhale 2 puffs into the lungs 2 (two) times daily as needed (for shortness of breath or wheezing).    . Calcium Carb-Cholecalciferol (CALCIUM 600/VITAMIN D3 PO) Take 1 tablet by mouth daily.    . cholecalciferol (VITAMIN D) 1000 UNITS tablet Take 1,000 Units by mouth daily.     . furosemide (LASIX) 80 MG tablet Take 40 mg by mouth daily.     . hydroxychloroquine (PLAQUENIL) 200 MG tablet Take 400 mg by mouth daily.     Marland Kitchen latanoprost (XALATAN) 0.005 % ophthalmic solution Place 1 drop into both eyes at bedtime.    Marland Kitchen losartan (COZAAR) 100 MG tablet Take 100 mg by mouth daily.    . Multiple Vitamin (  MULTIVITAMIN) tablet Take 1 tablet by mouth daily.    Vladimir Faster Glycol-Propyl Glycol (SYSTANE) 0.4-0.3 % GEL ophthalmic gel Place 2 application into both eyes 2 (two) times daily as needed (for dry eyes).    . potassium chloride SA (K-DUR,KLOR-CON) 20 MEQ tablet Take 2 tablets (40 mEq total) by mouth 2 (two) times daily.     No current facility-administered medications for this visit.     Allergies:   Iodinated diagnostic agents; Ciprofloxacin; E-mycin [erythromycin base]; and Oxycodone    Social History:  The patient  reports that she quit smoking about 24 years ago. She has a 10.00 pack-year smoking history. She has never used smokeless tobacco. She reports previous alcohol use. She reports that she does not use drugs.   Family  History:  The patient's family history includes Coronary artery disease in her brother; Lung disease in her father.    ROS:  Please see the history of present illness.   Otherwise, review of systems are positive for none.   All other systems are reviewed and negative.    PHYSICAL EXAM: VS:  BP (!) 168/72   Pulse 83   Ht 5\' 6"  (1.676 m)   Wt 202 lb 9.6 oz (91.9 kg)   SpO2 98%   BMI 32.70 kg/m  , BMI Body mass index is 32.7 kg/m. GENERAL:  Well appearing HEENT: Pupils equal round and reactive, fundi not visualized, oral mucosa unremarkable NECK:  No jugular venous distention, waveform within normal limits, carotid upstroke brisk and symmetric, no bruits, no thyromegaly LYMPHATICS:  No cervical adenopathy LUNGS:  Clear to auscultation bilaterally HEART:  RRR.  PMI not displaced or sustained,S1 and S2 within normal limits, no S3, no S4, no clicks, no rubs, no murmurs ABD:  Flat, positive bowel sounds normal in frequency in pitch, no bruits, no rebound, no guarding, no midline pulsatile mass, no hepatomegaly, no splenomegaly EXT:  2 plus pulses throughout, no edema, no cyanosis no clubbing SKIN:  No rashes no nodules NEURO:  Cranial nerves II through XII grossly intact, motor grossly intact throughout PSYCH:  Cognitively intact, oriented to person place and time   EKG:  EKG is not ordered today.   Lexiscan Myoview 05/07/18:  Nuclear stress EF: 68%.  There was no ST segment deviation noted during stress.  No T wave inversion was noted during stress.  The study is normal.  This is a low risk study.  The left ventricular ejection fraction is hyperdynamic (>65%).   Echo 01/29/18: Study Conclusions  - Left ventricle: The cavity size was normal. Wall thickness was   normal. Systolic function was normal. The estimated ejection   fraction was in the range of 60% to 65%. Wall motion was normal;   there were no regional wall motion abnormalities. Features are   consistent with a  pseudonormal left ventricular filling pattern,   with concomitant abnormal relaxation and increased filling   pressure (grade 2 diastolic dysfunction). - Mitral valve: Calcified annulus. There was mild regurgitation. - Pericardium, extracardiac: A trivial pericardial effusion was   identified.  Impressions:  - Normal LV systolic function; moderate diastolic dysfunction;   mild MR.  Recent Labs: 05/01/2018: Magnesium 1.9 08/09/2018: ALT 13 08/17/2018: BUN 10; Creatinine, Ser 0.88; Hemoglobin 10.5; Platelets 131; Potassium 3.9; Sodium 140    Lipid Panel No results found for: CHOL, TRIG, HDL, CHOLHDL, VLDL, LDLCALC, LDLDIRECT   09/05/17: Total cholesterol 212, triglycerides 50, HDL 76, LDL 126    Wt Readings from Last  3 Encounters:  08/28/18 202 lb 9.6 oz (91.9 kg)  08/16/18 207 lb 1.6 oz (93.9 kg)  08/09/18 207 lb 1.6 oz (93.9 kg)      ASSESSMENT AND PLAN:  # Chest pain: Resolved.  Likely musculoskeletal.  Lexiscan Myoview was negative for ischemia 04/2018.  # Shortness of breath: # Chronic diastolic heart failure: Ms. Casasola is euvolemic.  Her breathing is stable.  She has improved since stopping metoprolol.  Continue blood pressure control with losartan.  # Hypertension: Her blood pressure is always high in our office.  She attributes this to whitecoat hypertension.  Her blood pressure is well-controlled when she checks it at home.  I have encouraged her to bring her blood pressure cuff to her appointment with her PCP next month to make sure it is reading appropriately.  Continue losartan.  # Obesity:  Ms. Jeter was congratulated on weight loss.  She has been working on her diet and plans to start back exercising soon.  Current medicines are reviewed at length with the patient today.  The patient does not have concerns regarding medicines.  The following changes have been made:  no change  Labs/ tests ordered today include:  No orders of the defined types were placed in  this encounter.    Disposition:   FU with Lanya Bucks C. Oval Linsey, MD, [facility] in 1 year.    Signed, Tavyn Kurka C. Oval Linsey, MD, [facility]  08/28/2018 8:50 AM    [place]

## 2018-08-29 DIAGNOSIS — Z96612 Presence of left artificial shoulder joint: Secondary | ICD-10-CM | POA: Diagnosis not present

## 2018-08-29 DIAGNOSIS — M19012 Primary osteoarthritis, left shoulder: Secondary | ICD-10-CM | POA: Diagnosis not present

## 2018-08-29 DIAGNOSIS — M7522 Bicipital tendinitis, left shoulder: Secondary | ICD-10-CM | POA: Diagnosis not present

## 2018-08-29 DIAGNOSIS — Z471 Aftercare following joint replacement surgery: Secondary | ICD-10-CM | POA: Diagnosis not present

## 2018-09-04 DIAGNOSIS — H40023 Open angle with borderline findings, high risk, bilateral: Secondary | ICD-10-CM | POA: Diagnosis not present

## 2018-09-04 DIAGNOSIS — H04123 Dry eye syndrome of bilateral lacrimal glands: Secondary | ICD-10-CM | POA: Diagnosis not present

## 2018-09-04 DIAGNOSIS — H43811 Vitreous degeneration, right eye: Secondary | ICD-10-CM | POA: Diagnosis not present

## 2018-09-13 DIAGNOSIS — D696 Thrombocytopenia, unspecified: Secondary | ICD-10-CM | POA: Diagnosis not present

## 2018-09-13 DIAGNOSIS — N390 Urinary tract infection, site not specified: Secondary | ICD-10-CM | POA: Diagnosis not present

## 2018-09-13 DIAGNOSIS — M329 Systemic lupus erythematosus, unspecified: Secondary | ICD-10-CM | POA: Diagnosis not present

## 2018-09-13 DIAGNOSIS — Z0000 Encounter for general adult medical examination without abnormal findings: Secondary | ICD-10-CM | POA: Diagnosis not present

## 2018-09-13 DIAGNOSIS — E876 Hypokalemia: Secondary | ICD-10-CM | POA: Diagnosis not present

## 2018-09-13 DIAGNOSIS — I10 Essential (primary) hypertension: Secondary | ICD-10-CM | POA: Diagnosis not present

## 2018-09-13 DIAGNOSIS — Z780 Asymptomatic menopausal state: Secondary | ICD-10-CM | POA: Diagnosis not present

## 2018-09-20 DIAGNOSIS — M3501 Sicca syndrome with keratoconjunctivitis: Secondary | ICD-10-CM | POA: Diagnosis not present

## 2018-09-20 DIAGNOSIS — D696 Thrombocytopenia, unspecified: Secondary | ICD-10-CM | POA: Diagnosis not present

## 2018-09-20 DIAGNOSIS — I10 Essential (primary) hypertension: Secondary | ICD-10-CM | POA: Diagnosis not present

## 2018-09-20 DIAGNOSIS — J4520 Mild intermittent asthma, uncomplicated: Secondary | ICD-10-CM | POA: Diagnosis not present

## 2018-09-20 DIAGNOSIS — C8300 Small cell B-cell lymphoma, unspecified site: Secondary | ICD-10-CM | POA: Diagnosis not present

## 2018-09-20 DIAGNOSIS — M064 Inflammatory polyarthropathy: Secondary | ICD-10-CM | POA: Diagnosis not present

## 2018-09-20 DIAGNOSIS — Z0000 Encounter for general adult medical examination without abnormal findings: Secondary | ICD-10-CM | POA: Diagnosis not present

## 2018-09-20 DIAGNOSIS — M329 Systemic lupus erythematosus, unspecified: Secondary | ICD-10-CM | POA: Diagnosis not present

## 2018-09-20 DIAGNOSIS — E876 Hypokalemia: Secondary | ICD-10-CM | POA: Diagnosis not present

## 2018-11-01 ENCOUNTER — Ambulatory Visit: Payer: Medicare HMO | Admitting: Family Medicine

## 2018-11-01 ENCOUNTER — Encounter: Payer: Self-pay | Admitting: Family Medicine

## 2018-11-01 ENCOUNTER — Other Ambulatory Visit: Payer: Self-pay

## 2018-11-01 DIAGNOSIS — M549 Dorsalgia, unspecified: Secondary | ICD-10-CM | POA: Diagnosis not present

## 2018-11-01 DIAGNOSIS — M25569 Pain in unspecified knee: Secondary | ICD-10-CM | POA: Diagnosis not present

## 2018-11-01 DIAGNOSIS — M25511 Pain in right shoulder: Secondary | ICD-10-CM | POA: Diagnosis not present

## 2018-11-01 DIAGNOSIS — M3501 Sicca syndrome with keratoconjunctivitis: Secondary | ICD-10-CM | POA: Diagnosis not present

## 2018-11-01 DIAGNOSIS — M170 Bilateral primary osteoarthritis of knee: Secondary | ICD-10-CM | POA: Diagnosis not present

## 2018-11-01 DIAGNOSIS — M064 Inflammatory polyarthropathy: Secondary | ICD-10-CM | POA: Diagnosis not present

## 2018-11-01 DIAGNOSIS — M329 Systemic lupus erythematosus, unspecified: Secondary | ICD-10-CM | POA: Diagnosis not present

## 2018-11-01 DIAGNOSIS — M150 Primary generalized (osteo)arthritis: Secondary | ICD-10-CM | POA: Diagnosis not present

## 2018-11-01 DIAGNOSIS — M7989 Other specified soft tissue disorders: Secondary | ICD-10-CM | POA: Diagnosis not present

## 2018-11-01 DIAGNOSIS — Z79899 Other long term (current) drug therapy: Secondary | ICD-10-CM | POA: Diagnosis not present

## 2018-11-01 NOTE — Assessment & Plan Note (Signed)
Patient does have arthritic changes bilaterally.  Bilateral steroid injections today.  Viscosupplementation will be looking from prior approval.  Has responded well to [place] previously.  Follow-up with me again in 4 weeks.

## 2018-11-01 NOTE — Patient Instructions (Signed)
Good ot see you  Ice is your friend Stay active We will give it 4 weeks

## 2018-11-01 NOTE — Progress Notes (Signed)
[name] [address] [place], [place] [address] Phone: [phone] Subjective:     CC: Bilateral knee pain  TMH:[id number]  [name] is a 68 y.o. female coming in with complaint of bilateral knee pain. Last seen in 03/2018. She did have relief from a steroid injection. Pain is over patellar tendon and on medial aspect of right knee.  Patient has had severe arthritis of the knees bilaterally.  Responding fairly well to [place] supplementation previously.  Patient has been doing home exercises intermittently.  She has noticed recently though having more difficulty getting up and down stairs and a soreness at night.  Mild increase in instability of the right knee      Past Medical History:  Diagnosis Date  . Acid reflux   . Arthritis   . Asthma   . Hypertension   . Lupus ([place]) 07/13/2011  . 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 PONV (postoperative nausea and vomiting)   . Stroke [facility])    lt side weakness   Past Surgical History:  Procedure Laterality Date  . ABDOMINAL HYSTERECTOMY    . BACK SURGERY    . CHOLECYSTECTOMY    . COLONOSCOPY    . FOOT SURGERY     x2  . MASS EXCISION     back  . rortator     rotator cuff  . TOTAL SHOULDER ARTHROPLASTY Left 08/16/2018   Procedure: LEFT SHOULDER REVERSE TOTAL SHOULDER ARTHROPLASTY AND BICEPS TENOTOMY;  Surgeon: Tania Ade, MD;  Location: [place];  Service: Orthopedics;  Laterality: Left;   Social History   Socioeconomic History  . Marital status: Married    Spouse name: Not on file  . Number of children: Not on file  . Years of education: Not on file  . Highest education level: Not on file  Occupational History  . Not on file  Social Needs  . Financial resource strain: Not on file  . Food insecurity:    Worry: Not on file    Inability: Not on file  . Transportation needs:    Medical: Not on file    Non-medical: Not on file  Tobacco Use  . Smoking status: Former  Smoker    Packs/day: 1.00    Years: 10.00    Pack years: 10.00    Last attempt to quit: 06/17/1994    Years since quitting: 24.3  . Smokeless tobacco: Never Used  Substance and Sexual Activity  . Alcohol use: Not Currently    Alcohol/week: 0.0 standard drinks  . Drug use: No  . Sexual activity: Not Currently  Lifestyle  . Physical activity:    Days per week: Not on file    Minutes per session: Not on file  . Stress: Not on file  Relationships  . Social connections:    Talks on phone: Not on file    Gets together: Not on file    Attends religious service: Not on file    Active member of club or organization: Not on file    Attends meetings of clubs or organizations: Not on file    Relationship status: Not on file  Other Topics Concern  . Not on file  Social History Narrative  . Not on file   Allergies  Allergen Reactions  . Iodinated Diagnostic Agents Other (See Comments)    Kidney failure.   . Ciprofloxacin Itching  . E-Mycin [Erythromycin Base] Nausea And Vomiting  . Oxycodone Other (See Comments)  Hallucination.   Family History  Problem Relation Age of Onset  . Lung disease Father   . Coronary artery disease Brother      Current Outpatient Medications (Cardiovascular):  .  furosemide (LASIX) 80 MG tablet, Take 40 mg by mouth daily.  Marland Kitchen  losartan (COZAAR) 100 MG tablet, Take 100 mg by mouth daily.  Current Outpatient Medications (Respiratory):  .  albuterol (PROVENTIL HFA;VENTOLIN HFA) 108 (90 Base) MCG/ACT inhaler, Inhale 2 puffs into the lungs every 6 (six) hours as needed for wheezing or shortness of breath. .  budesonide-formoterol (SYMBICORT) 160-4.5 MCG/ACT inhaler, Inhale 2 puffs into the lungs 2 (two) times daily as needed (for shortness of breath or wheezing).  Current Outpatient Medications (Analgesics):  .  aspirin EC 81 MG tablet, Take 81 mg by mouth daily.   Current Outpatient Medications (Other):  .  Biotin 1000 MCG tablet, Take 1,000 mcg by  mouth daily. .  Calcium Carb-Cholecalciferol (CALCIUM 600/VITAMIN D3 PO), Take 1 tablet by mouth daily. .  cholecalciferol (VITAMIN D) 1000 UNITS tablet, Take 1,000 Units by mouth daily.  .  hydroxychloroquine (PLAQUENIL) 200 MG tablet, Take 400 mg by mouth daily.  Marland Kitchen  latanoprost (XALATAN) 0.005 % ophthalmic solution, Place 1 drop into both eyes at bedtime. .  Multiple Vitamin (MULTIVITAMIN) tablet, Take 1 tablet by mouth daily. Vladimir Faster Glycol-Propyl Glycol (SYSTANE) 0.4-0.3 % GEL ophthalmic gel, Place 2 application into both eyes 2 (two) times daily as needed (for dry eyes). .  potassium chloride SA (K-DUR,KLOR-CON) 20 MEQ tablet, Take 2 tablets (40 mEq total) by mouth 2 (two) times daily.    Past medical history, social, surgical and family history all reviewed in electronic medical record.  No pertanent information unless stated regarding to the chief complaint.   Review of Systems:  No headache, visual changes, nausea, vomiting, diarrhea, constipation, dizziness, abdominal pain, skin rash, fevers, chills, night sweats, weight loss, swollen lymph nodes, body aches, joint swelling,chest pain, shortness of breath, mood changes.  Positive muscle aches  Objective  Blood pressure 140/84, pulse 88, height 5\' 6"  (1.676 m), weight 201 lb (91.2 kg), SpO2 99 %.   General: No apparent distress alert and oriented x3 mood and affect normal, dressed appropriately.  HEENT: Pupils equal, extraocular movements intact  Respiratory: Patient's speak in full sentences and does not appear short of breath  Cardiovascular: No lower extremity edema, non tender, no erythema  Skin: Warm dry intact with no signs of infection or rash on extremities or on axial skeleton.  Abdomen: Soft nontender  Neuro: Cranial nerves II through XII are intact, neurovascularly intact in all extremities with 2+ DTRs and 2+ pulses.  Lymph: No lymphadenopathy of posterior or anterior cervical chain or axillae bilaterally.  Gait  antalgic gait MSK:  Non tender with full range of motion and good stability and symmetric strength and tone of shoulders, elbows, wrist, hip, and ankles bilaterally. Knee: Bilateral valgus deformity noted.  Abnormal thigh to calf ratio.  Tender to palpation over medial and PF joint line.  ROM full in flexion and extension and lower leg rotation. instability with valgus force.  painful patellar compression. Patellar glide with moderate crepitus. Patellar and quadriceps tendons unremarkable. Hamstring and quadriceps strength is normal.  After informed written and verbal consent, patient was seated on exam table. Right knee was prepped with alcohol swab and utilizing anterolateral approach, patient's right knee space was injected with 4:1  marcaine 0.5%: Kenalog 40mg /dL. Patient tolerated the procedure well without immediate  complications.  After informed written and verbal consent, patient was seated on exam table. Left knee was prepped with alcohol swab and utilizing anterolateral approach, patient's left knee space was injected with 4:1  marcaine 0.5%: Kenalog 40mg /dL. Patient tolerated the procedure well without immediate complications.    Impression and Recommendations:     This case required medical decision making of moderate complexity. The above documentation has been reviewed and is accurate and complete Lyndal Pulley, DO       Note: This dictation was prepared with Dragon dictation along with smaller phrase technology. Any transcriptional errors that result from this process are unintentional.

## 2018-11-07 DIAGNOSIS — M19012 Primary osteoarthritis, left shoulder: Secondary | ICD-10-CM | POA: Diagnosis not present

## 2018-11-28 NOTE — Progress Notes (Signed)
[name] [place],  [address] Phone: [phone] Subjective:   [name], am serving as a scribe for Dr. Hulan Saas.  I'm seeing this patient by the request  of:    CC:   [id number]   11/01/2018: Patient does have arthritic changes bilaterally.  Bilateral steroid injections today.  Viscosupplementation will be looking from prior approval.  Has responded well to [place] previously.  Follow-up with me again in 4 weeks.  Update 11/29/2018: [name] is a 68 y.o. female coming in with complaint of bilateral knee pain. Pain is worse on right than left. Last injection only lasted 2 weeks. Does have feeling that her knees are not strong and are going to give out on her. Is ambulating with a cane today.  Affecting daily activities.  Sometimes keeps her up at night.  Has medical history is significant for lupus      Past Medical History:  Diagnosis Date  . Acid reflux   . Arthritis   . Asthma   . Hypertension   . Lupus ([place]) 07/13/2011  . 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 PONV (postoperative nausea and vomiting)   . Stroke [facility])    lt side weakness   Past Surgical History:  Procedure Laterality Date  . ABDOMINAL HYSTERECTOMY    . BACK SURGERY    . CHOLECYSTECTOMY    . COLONOSCOPY    . FOOT SURGERY     x2  . MASS EXCISION     back  . rortator     rotator cuff  . TOTAL SHOULDER ARTHROPLASTY Left 08/16/2018   Procedure: LEFT SHOULDER REVERSE TOTAL SHOULDER ARTHROPLASTY AND BICEPS TENOTOMY;  Surgeon: Tania Ade, MD;  Location: No Name;  Service: Orthopedics;  Laterality: Left;   Social History   Socioeconomic History  . Marital status: Married    Spouse name: Not on file  . Number of children: Not on file  . Years of education: Not on file  . Highest education level: Not on file  Occupational History  . Not on file  Social Needs  . Financial resource strain: Not on file  .  Food insecurity:    Worry: Not on file    Inability: Not on file  . Transportation needs:    Medical: Not on file    Non-medical: Not on file  Tobacco Use  . Smoking status: Former Smoker    Packs/day: 1.00    Years: 10.00    Pack years: 10.00    Last attempt to quit: 06/17/1994    Years since quitting: 24.4  . Smokeless tobacco: Never Used  Substance and Sexual Activity  . Alcohol use: Not Currently    Alcohol/week: 0.0 standard drinks  . Drug use: No  . Sexual activity: Not Currently  Lifestyle  . Physical activity:    Days per week: Not on file    Minutes per session: Not on file  . Stress: Not on file  Relationships  . Social connections:    Talks on phone: Not on file    Gets together: Not on file    Attends religious service: Not on file    Active member of club or organization: Not on file    Attends meetings of clubs or organizations: Not on file    Relationship status: Not on file  Other Topics Concern  . Not on file  Social History Narrative  . Not on file   Allergies  Allergen Reactions  . Iodinated Diagnostic Agents Other (See Comments)    Kidney failure.   . Ciprofloxacin Itching  . E-Mycin [Erythromycin Base] Nausea And Vomiting  . Oxycodone Other (See Comments)    Hallucination.   Family History  Problem Relation Age of Onset  . Lung disease Father   . Coronary artery disease Brother      Current Outpatient Medications (Cardiovascular):  .  furosemide (LASIX) 80 MG tablet, Take 40 mg by mouth daily.  Marland Kitchen  losartan (COZAAR) 100 MG tablet, Take 100 mg by mouth daily.  Current Outpatient Medications (Respiratory):  .  albuterol (PROVENTIL HFA;VENTOLIN HFA) 108 (90 Base) MCG/ACT inhaler, Inhale 2 puffs into the lungs every 6 (six) hours as needed for wheezing or shortness of breath. .  budesonide-formoterol (SYMBICORT) 160-4.5 MCG/ACT inhaler, Inhale 2 puffs into the lungs 2 (two) times daily as needed (for shortness of breath or wheezing).   Current Outpatient Medications (Analgesics):  .  aspirin EC 81 MG tablet, Take 81 mg by mouth daily.   Current Outpatient Medications (Other):  .  Biotin 1000 MCG tablet, Take 1,000 mcg by mouth daily. .  Calcium Carb-Cholecalciferol (CALCIUM 600/VITAMIN D3 PO), Take 1 tablet by mouth daily. .  cholecalciferol (VITAMIN D) 1000 UNITS tablet, Take 1,000 Units by mouth daily.  .  hydroxychloroquine (PLAQUENIL) 200 MG tablet, Take 400 mg by mouth daily.  Marland Kitchen  latanoprost (XALATAN) 0.005 % ophthalmic solution, Place 1 drop into both eyes at bedtime. .  Multiple Vitamin (MULTIVITAMIN) tablet, Take 1 tablet by mouth daily. Vladimir Faster Glycol-Propyl Glycol (SYSTANE) 0.4-0.3 % GEL ophthalmic gel, Place 2 application into both eyes 2 (two) times daily as needed (for dry eyes). .  potassium chloride SA (K-DUR,KLOR-CON) 20 MEQ tablet, Take 2 tablets (40 mEq total) by mouth 2 (two) times daily.    Past medical history, social, surgical and family history all reviewed in electronic medical record.  No pertanent information unless stated regarding to the chief complaint.   Review of Systems:  No headache, visual changes, nausea, vomiting, diarrhea, constipation, dizziness, abdominal pain, skin rash, fevers, chills, night sweats, weight loss, swollen lymph nodes, body aches, joint swelling, chest pain, shortness of breath, mood changes.  Positive muscle aches  Objective  Blood pressure (!) 150/82, pulse 62, height 5\' 6"  (1.676 m), weight 201 lb (91.2 kg), SpO2 99 %.    General: No apparent distress alert and oriented x3 mood and affect normal, dressed appropriately.  HEENT: Pupils equal, extraocular movements intact  Respiratory: Patient's speak in full sentences and does not appear short of breath  Cardiovascular: Trace lower extremity edema, non tender, no erythema  Skin: Warm dry intact with no signs of infection or rash on extremities or on axial skeleton.  Abdomen: Soft nontender  Neuro: Cranial  nerves II through XII are intact, neurovascularly intact in all extremities with 2+ DTRs and 2+ pulses.  Lymph: No lymphadenopathy of posterior or anterior cervical chain or axillae bilaterally.  Gait antalgic gait using a cane MSK:  Non tender with full range of motion and good stability and symmetric strength and tone of shoulders, elbows, wrist, hip and ankles bilaterally.  Knee: Bilateral valgus deformity noted. Large thigh to calf ratio.  Tender to palpation over medial and PF joint line.  ROM full in flexion and extension and lower leg rotation. instability with valgus force.  painful patellar compression. Patellar glide with moderate crepitus. Patellar and quadriceps tendons unremarkable. Hamstring and quadriceps strength is normal.  After informed written and verbal consent, patient was seated on exam table. Right knee was prepped with alcohol swab and utilizing anterolateral approach, patient's right knee space was injected with 22 mg/mL of monovisc (sodium hyaluronate) in a prefilled syringe was injected easily into the knee through a 22-gauge needle..Patient tolerated the procedure well without immediate complications.  After informed written and verbal consent, patient was seated on exam table. Left knee was prepped with alcohol swab and utilizing anterolateral approach, patient's left knee space was injected with 22 mg/mL of Monovisc (sodium hyaluronate) in a prefilled syringe was injected easily into the knee through a 22-gauge needle..Patient tolerated the procedure well without immediate complications.    Impression and Recommendations:     This case required medical decision making of moderate complexity. The above documentation has been reviewed and is accurate and complete Lyndal Pulley, DO       Note: This dictation was prepared with Dragon dictation along with smaller phrase technology. Any transcriptional errors that result from this process are unintentional.

## 2018-11-29 ENCOUNTER — Other Ambulatory Visit: Payer: Self-pay

## 2018-11-29 ENCOUNTER — Encounter: Payer: Self-pay | Admitting: Family Medicine

## 2018-11-29 ENCOUNTER — Ambulatory Visit (INDEPENDENT_AMBULATORY_CARE_PROVIDER_SITE_OTHER): Payer: Medicare HMO | Admitting: Family Medicine

## 2018-11-29 DIAGNOSIS — M170 Bilateral primary osteoarthritis of knee: Secondary | ICD-10-CM | POA: Diagnosis not present

## 2018-11-29 NOTE — Patient Instructions (Signed)
God to see you  Should last hopefully 6 months Stay active Call [phone] when you need [place]  Be safe!

## 2018-11-29 NOTE — Assessment & Plan Note (Signed)
Patient did do very relatively well with the last viscosupplementation and I am hoping the patient will do well as well.  We discussed icing regimen and home exercise.  We discussed which activities to do avoid.  Patient will increase activity as tolerated.  Continue same medications.  Follow-up again 6 to 8 weeks

## 2019-02-01 DIAGNOSIS — R002 Palpitations: Secondary | ICD-10-CM | POA: Diagnosis not present

## 2019-02-01 DIAGNOSIS — R5383 Other fatigue: Secondary | ICD-10-CM | POA: Diagnosis not present

## 2019-02-01 DIAGNOSIS — Z7189 Other specified counseling: Secondary | ICD-10-CM | POA: Diagnosis not present

## 2019-02-01 DIAGNOSIS — R55 Syncope and collapse: Secondary | ICD-10-CM | POA: Diagnosis not present

## 2019-02-11 DIAGNOSIS — Z96612 Presence of left artificial shoulder joint: Secondary | ICD-10-CM | POA: Diagnosis not present

## 2019-02-11 DIAGNOSIS — M19012 Primary osteoarthritis, left shoulder: Secondary | ICD-10-CM | POA: Diagnosis not present

## 2019-02-11 DIAGNOSIS — Z471 Aftercare following joint replacement surgery: Secondary | ICD-10-CM | POA: Diagnosis not present

## 2019-02-14 ENCOUNTER — Encounter: Payer: Self-pay | Admitting: General Practice

## 2019-02-21 DIAGNOSIS — R55 Syncope and collapse: Secondary | ICD-10-CM | POA: Diagnosis not present

## 2019-02-22 DIAGNOSIS — Z7189 Other specified counseling: Secondary | ICD-10-CM | POA: Diagnosis not present

## 2019-02-22 DIAGNOSIS — R55 Syncope and collapse: Secondary | ICD-10-CM | POA: Diagnosis not present

## 2019-02-22 DIAGNOSIS — R42 Dizziness and giddiness: Secondary | ICD-10-CM | POA: Diagnosis not present

## 2019-02-22 DIAGNOSIS — Z23 Encounter for immunization: Secondary | ICD-10-CM | POA: Diagnosis not present

## 2019-02-25 DIAGNOSIS — R55 Syncope and collapse: Secondary | ICD-10-CM | POA: Diagnosis not present

## 2019-02-28 ENCOUNTER — Ambulatory Visit (INDEPENDENT_AMBULATORY_CARE_PROVIDER_SITE_OTHER): Payer: Medicare HMO | Admitting: Neurology

## 2019-02-28 ENCOUNTER — Encounter: Payer: Self-pay | Admitting: Neurology

## 2019-02-28 ENCOUNTER — Other Ambulatory Visit: Payer: Self-pay

## 2019-02-28 VITALS — BP 152/76 | HR 80 | Ht 65.0 in | Wt 200.0 lb

## 2019-02-28 DIAGNOSIS — R42 Dizziness and giddiness: Secondary | ICD-10-CM | POA: Diagnosis not present

## 2019-02-28 DIAGNOSIS — G479 Sleep disorder, unspecified: Secondary | ICD-10-CM | POA: Diagnosis not present

## 2019-02-28 DIAGNOSIS — R51 Headache: Secondary | ICD-10-CM

## 2019-02-28 DIAGNOSIS — G4489 Other headache syndrome: Secondary | ICD-10-CM | POA: Diagnosis not present

## 2019-02-28 DIAGNOSIS — I951 Orthostatic hypotension: Secondary | ICD-10-CM

## 2019-02-28 DIAGNOSIS — R519 Headache, unspecified: Secondary | ICD-10-CM

## 2019-02-28 NOTE — Patient Instructions (Addendum)
Your history and examination does not suggest vertigo.  Your neurological exam is good today, no new findings, you do have arthritis affecting your mobility.  Your blood pressure drops when you change positions, be very well-hydrated and change positions slowly, talk to your cardiologist as well.  Please talk to your primary care physician about the possibility of doing a brain MRI with contrast.  In the past, you had some kidney impairment after taking contrast, currently your kidney function is good however.  You may benefit from seeing your kidney doctor to make absolutely sure that you can take a brain scan with contrast.  As discussed, we will proceed with a brain MRI and a sleep study to rule out obstructive sleep apnea. We will call you with the results and take it from there.

## 2019-02-28 NOTE — Progress Notes (Signed)
Subjective:    [name] ID: Erica [name] is a 68 y.o. female.  HPI     Erica Age, MD, PhD [facility] [address], [address] [name], Erica [name]  Dear Dr. Ashby [name], I saw your [name], Erica [name], upon your kind request in my neurologic clinic today for initial consultation of her vertigo.  Erica [name] is unaccompanied today.  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  She has had these pre-syncopal spells since January.  She had some work-up for this, she had seen cardiology in Erica past.  She had a 2-week Holter monitor which showed mostly benign findings, some sinus tachycardia per your records.  She denies any actual spinning sensation.  She feels like she may veer backwards or forwards, sometimes it feels like a sway back-and-forth.  She has not actually fallen but one time last month she was veering backwards and was caught by her niece.  She walks with a cane for balance.  She has arthritis.  She has right more than left knee pain.  She has arthritis in her hands as well.  She denies any associated neurological symptoms such as one-sided weakness or numbness or tingling or droopy face or slurring of speech.  With her prior stroke in Erica distant past she had some residual left leg weakness, sometimes when she is fatigued she can feel it more.  She has no nausea or vomiting and no migrainous headaches but has had intermittent sharp shooting pains in Erica right side of her head, parasagittal area, lasting perhaps 10 seconds at a time.   She lives with her husband.  She does not  sleep very well.  She goes to sleep but wakes up multiple times in Erica night.  She tries to be in bed around 11.  She wakes up a couple of hours later.  Rise time is variable, mostly around 9 AM.  She does not have night to night nocturia or morning headaches.  She is familiar with Erica diagnosis of sleep apnea as her husband has a CPAP machine.  She does snore intermittently.  She does not wake up rested, Epworth sleepiness score is 4 out of 24, fatigue severity score is 24 out of 63.  She has had some chest discomfort.  She quit smoking some 20 years ago and drinks alcohol rarely, caffeine in Erica form of 1 Pepsi per day and decaf tea 1 to 2 cups.  She reports having had a reaction with kidney failure in Erica past when she took MRI contrast.  She would be willing to consider a brain MRI with contrast but is reluctant at this time, her BUN and creatinine were normal when she had her blood work on 02/01/2019.  She remembers seeing a kidney specialist some 10 years ago. I reviewed your office note from 02/22/2019 which you kindly included as well as 02/01/2019.  She had recent lab work in your office including magnesium which was 1.7, CBC with differential unremarkable with Erica exception of borderline low RBC of 4.0, comprehensive metabolic profile showed glucose of 102, BUN 11, creatinine 0.9, potassium was slightly low at 3.3, TSH normal at 2.86.  She had a brain MRI without contrast on 11/28/2013 and I reviewed Erica results: IMPRESSION: Mild chronic microvascular ischemia.  No acute abnormality. She had a chronic infarct in Erica right external capsule and small chronic infarct in Erica central pons, Erica indication was history of lymphoma and vertigo at Erica time.  Her Past Medical History Is Significant For: Past Medical History:  Diagnosis Date  . Acid reflux   . Arthritis   . Asthma   . Hypertension   . Lupus (Erica [name]) 07/13/2011  . 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 PONV (postoperative nausea  and vomiting)   . Stroke [name])    lt side weakness    Her Past Surgical History Is Significant For: Past Surgical History:  Procedure Laterality Date  . ABDOMINAL HYSTERECTOMY    . BACK SURGERY    . CHOLECYSTECTOMY    . COLONOSCOPY    . FOOT SURGERY     x2  . MASS EXCISION     back  . rortator     rotator cuff  . TOTAL SHOULDER ARTHROPLASTY Left 08/16/2018   Procedure: LEFT SHOULDER REVERSE TOTAL SHOULDER ARTHROPLASTY AND BICEPS TENOTOMY;  Surgeon: Erica Ade, MD;  Location: [facility];  Service: Orthopedics;  Laterality: Left;    Her Family History Is Significant For: Family History  Problem Relation [name] of Onset  . Lung disease Father   . Coronary artery disease Brother     Her Social History Is Significant For: Social History   Socioeconomic History  . Marital status: Married    Spouse name: Not on file  . Number of children: Not on file  . Years of education: Not on file  . Highest education level: Not on file  Occupational History  . Not on file  Social Needs  . Financial resource strain: Not on file  . Food insecurity    Worry: Not on file    Inability: Not on file  . Transportation needs    Medical: Not on file    Non-medical: Not on file  Tobacco Use  . Smoking status: Former Smoker    Packs/day: 1.00    Years: 10.00    Pack years: 10.00    Quit date: 06/17/1994    Years since quitting: 24.7  . Smokeless tobacco: Never Used  Substance and Sexual Activity  . Alcohol use: Not Currently    Alcohol/week: 0.0 standard drinks  . Drug use: No  . Sexual activity: Not Currently  Lifestyle  . Physical activity    Days per week: Not on file    Minutes per session: Not on file  . Stress: Not on file  Relationships  . Social Herbalist on phone: Not on file    Gets together: Not on file    Attends religious service: Not on file    Active member of club or organization: Not on file    Attends meetings of clubs or organizations: Not on file     Relationship status: Not on file  Other Topics Concern  . Not on file  Social History Narrative  . Not on file    Her Allergies Are:  Allergies  Allergen Reactions  . Iodinated Diagnostic Agents Other (See Comments)    Kidney failure.   . Ciprofloxacin Itching  . E-Mycin [Erythromycin Base] Nausea And Vomiting  . Oxycodone Other (See Comments)    Hallucination.  :   Her Current Medications Are:  Outpatient Encounter Medications as of 02/28/2019  Medication Sig  . albuterol (PROVENTIL HFA;VENTOLIN HFA) 108 (90 Base) MCG/ACT inhaler Inhale 2 puffs into Erica lungs every 6 (six) hours as needed for wheezing or shortness of breath.  Marland Kitchen aspirin EC 81 MG tablet Take 81 mg by mouth daily.  . Biotin 1000 MCG tablet Take 1,000 mcg by mouth daily.  . budesonide-formoterol (SYMBICORT) 160-4.5 MCG/ACT inhaler Inhale 2 puffs into Erica lungs 2 (two) times daily as needed (for shortness of breath or wheezing).  . Calcium Carb-Cholecalciferol (CALCIUM 600/VITAMIN D3 PO) Take 1 tablet by mouth daily.  . cholecalciferol (VITAMIN D) 1000 UNITS tablet Take 1,000 Units by mouth daily.   . furosemide (LASIX) 80 MG tablet Take 40 mg by mouth daily.   . hydroxychloroquine (PLAQUENIL) 200 MG tablet Take 400 mg by mouth daily.   Marland Kitchen latanoprost (XALATAN) 0.005 % ophthalmic solution Place 1 drop into both eyes at bedtime.  Marland Kitchen losartan (COZAAR) 100 MG tablet Take 100 mg by mouth daily.  . Multiple Vitamin (MULTIVITAMIN) tablet Take 1 tablet by mouth daily.  Vladimir Faster Glycol-Propyl Glycol (SYSTANE) 0.4-0.3 % GEL ophthalmic gel Place 2 application into both eyes 2 (two) times daily as needed (for dry eyes).  . potassium chloride SA (K-DUR,KLOR-CON) 20 MEQ tablet Take 2 tablets (40 mEq total) by mouth 2 (two) times daily.   No facility-administered encounter medications on file as of 02/28/2019.   :   Review of Systems:  Out of a complete 14 point review of systems, all are reviewed and negative with Erica  exception of these symptoms as listed below:   Review of Systems  Neurological:       Pt presents today to discuss her dizzy spells. Pt has spells sitting down or standing. Pt does not sleep well. Pt has never had a sleep study but does endorse snoring.  Epworth Sleepiness Scale 0= would never doze 1= slight chance of dozing 2= moderate chance of dozing 3= high chance of dozing  Sitting and reading: 2 Watching TV: 1 Sitting inactive in a public place (ex. Theater or meeting): 0 As a passenger in a car for an hour without a break: 0 Lying down to rest in Erica afternoon: 0 Sitting and talking to someone: 0 Sitting quietly after lunch (no alcohol): 1 In a car, while stopped in traffic: 0 Total: 4     Objective:  Neurological Exam  Physical Exam Physical Examination:   Vitals:   02/28/19 1138  BP: (!) 152/76  Pulse: 80   On orthostatic testing, lying blood pressure and pulse was 186/91 with a pulse of 80, sitting 152/76 with a pulse of 82, standing 160/72 with a pulse of 82.  She does have lightheadedness upon sitting up.   General Examination: Erica [name] is a very pleasant 68 y.o. female in no acute distress. She appears well-developed and well-nourished and well groomed.   HEENT: Normocephalic, atraumatic, pupils are equal, round and reactive to light and accommodation. She wears corrective eyeglasses.  Extraocular tracking is good, no nystagmus is seen.  Hearing is grossly intact but she reports hearing loss on Erica left side.  Tympanic membranes are clear bilaterally, slight wax in Erica left ear canal.  Face is symmetric, no facial abnormal movements, speech is clear without dysarthria or hypophonia.  Neck is supple.  Airway examination reveals mild airway crowding, mild to moderate mouth dryness.  Neck circumference is 14 and three-quarter inches.  Chest: Clear to auscultation without wheezing, rhonchi or crackles noted.  Heart: S1+S2+0, regular  and normal without murmurs,  rubs or gallops noted.   Abdomen: Soft, non-tender and non-distended with normal bowel sounds appreciated on auscultation.  Extremities: There is no pitting edema in Erica distal lower extremities bilaterally. Pedal pulses are intact.  Skin: Warm and dry without trophic changes noted.  Musculoskeletal: exam reveals Arthritic changes in both hands, right more than left, bilateral knee discomfort with right knee swelling noted.  She has mild genu valgus bilat.   Neurologically:  Mental status: Erica [name] is awake, alert and oriented in all 4 spheres. Her immediate and remote memory, attention, language skills and fund of knowledge are appropriate. There is no evidence of aphasia, agnosia, apraxia or anomia. Speech is clear with normal prosody and enunciation. Thought process is linear. Mood is normal and affect is normal.  Cranial nerves II - XII are as described above under HEENT exam. In addition: shoulder shrug is normal with equal shoulder height noted. Motor exam: Normal bulk, strength and tone is noted. There is no drift, tremor or rebound. Romberg is negative, With Erica exception of slight sway in Erica beginning. Reflexes are 1+ in Erica upper extremities and trace in Erica lower extremities. Babinski: Toes are flexor bilaterally. Fine motor skills and coordination: intact with normal finger taps, normal hand movements, normal rapid alternating patting, normal foot taps and normal foot agility.  Cerebellar testing: No dysmetria or intention tremor on finger to nose testing. Heel to shin is Difficult bilaterally because of knee pain, otherwise no dysmetria. There is no truncal or gait ataxia.  Sensory exam: intact to light touch in Erica upper and lower extremities.  Gait, station and balance: She stands With mild difficulty and pushes herself up.  Posture is [name]-appropriate.  She walks cautiously and slowly, turns slowly, no shuffling, preserved arm swing noted bilaterally.  She brought a single-point  cane but does not have to use it for Erica short distance we tested.   Assessment and Plan:  In summary, Erica [name] is a very pleasant 68 y.o.-year old female with an underlying medical history of asthma, arthritis, status post left shoulder replacement in February 2020, Sjogren's syndrome, asthma, history of lymphoma in remission, low back pain, acid reflux, hypertension, lupus, history of stroke, and obesity, who Presents for evaluation of her intermittent symptoms of off balance, lightheadedness, feeling like she is going to fall.  Her history and examination are not telltale for vertigo.  Nevertheless, checkup with ENT may be reasonable, especially since she also reports hearing loss on Erica left side.  She denies any tinnitus as or pulsating feeling.  She has a significant orthostatic blood pressure drop.  She also had symptoms upon sitting up.  She is encouraged to talk to you about further evaluation of this and may need to see her cardiologist again.She is advised to hydrate well and change positions slowly. Neurological exam is largely nonfocal. Nevertheless, she does report very short-lived intermittent pain on Erica right side of her head. I therefore recommend that we proceed with a brain MRI with and without contrast to rule out a structural cause of her symptoms. Her BUN and creatinine were normal on 02/01/19 when she had blood work through your office.  Nevertheless, she is reluctant to proceed with MRI with contrast.  She is encouraged to talk to you about this and she may benefit from seeing her nephrologist again, Before we proceed with Erica brain MRI with contrast, just to make sure. If need be, I can change it to without  contrast. In addition, given her sleep difficulty, nonrestorative sleep, sleep disruption, we will proceed with a sleep study.  Sleep deprivation can cause some vague daytime symptoms I explained to her.  She is familiar with a diagnosis of OSA and would be willing to get  checked out.  We will call her soon to schedule her sleep study.  We will call her with her test results and take it from there. I answered all her questions today and Erica [name] was in agreement.   Thank you very much for allowing me to participate in Erica care of this nice [name]. If I can be of any further assistance to you please do not hesitate to call me at [phone].  Sincerely,   Erica Age, MD, PhD

## 2019-03-14 ENCOUNTER — Encounter: Payer: Self-pay | Admitting: General Practice

## 2019-03-14 DIAGNOSIS — J4520 Mild intermittent asthma, uncomplicated: Secondary | ICD-10-CM | POA: Diagnosis not present

## 2019-03-14 DIAGNOSIS — I10 Essential (primary) hypertension: Secondary | ICD-10-CM | POA: Diagnosis not present

## 2019-03-14 DIAGNOSIS — E876 Hypokalemia: Secondary | ICD-10-CM | POA: Diagnosis not present

## 2019-03-19 ENCOUNTER — Telehealth: Payer: Self-pay | Admitting: Neurology

## 2019-03-19 ENCOUNTER — Emergency Department (HOSPITAL_COMMUNITY): Payer: Medicare HMO

## 2019-03-19 ENCOUNTER — Emergency Department (HOSPITAL_COMMUNITY)
Admission: EM | Admit: 2019-03-19 | Discharge: 2019-03-19 | Disposition: A | Discharge status: Home / Self Care | Payer: Medicare HMO | Attending: Emergency Medicine | Admitting: Emergency Medicine

## 2019-03-19 ENCOUNTER — Encounter (HOSPITAL_COMMUNITY): Payer: Self-pay

## 2019-03-19 ENCOUNTER — Other Ambulatory Visit: Payer: Self-pay

## 2019-03-19 DIAGNOSIS — I10 Essential (primary) hypertension: Secondary | ICD-10-CM | POA: Diagnosis not present

## 2019-03-19 DIAGNOSIS — J45909 Unspecified asthma, uncomplicated: Secondary | ICD-10-CM | POA: Insufficient documentation

## 2019-03-19 DIAGNOSIS — S0990XA Unspecified injury of head, initial encounter: Secondary | ICD-10-CM | POA: Diagnosis not present

## 2019-03-19 DIAGNOSIS — Y999 Unspecified external cause status: Secondary | ICD-10-CM | POA: Insufficient documentation

## 2019-03-19 DIAGNOSIS — W01190A Fall on same level from slipping, tripping and stumbling with subsequent striking against furniture, initial encounter: Secondary | ICD-10-CM | POA: Diagnosis not present

## 2019-03-19 DIAGNOSIS — R55 Syncope and collapse: Secondary | ICD-10-CM | POA: Insufficient documentation

## 2019-03-19 DIAGNOSIS — Z8673 Personal history of transient ischemic attack (TIA), and cerebral infarction without residual deficits: Secondary | ICD-10-CM | POA: Insufficient documentation

## 2019-03-19 DIAGNOSIS — Z79899 Other long term (current) drug therapy: Secondary | ICD-10-CM | POA: Insufficient documentation

## 2019-03-19 DIAGNOSIS — S0001XA Abrasion of scalp, initial encounter: Secondary | ICD-10-CM | POA: Diagnosis not present

## 2019-03-19 DIAGNOSIS — Y9259 Other trade areas as the place of occurrence of the external cause: Secondary | ICD-10-CM | POA: Insufficient documentation

## 2019-03-19 DIAGNOSIS — Z87891 Personal history of nicotine dependence: Secondary | ICD-10-CM | POA: Insufficient documentation

## 2019-03-19 DIAGNOSIS — Y939 Activity, unspecified: Secondary | ICD-10-CM | POA: Insufficient documentation

## 2019-03-19 LAB — BASIC METABOLIC PANEL
Anion gap: 8 (ref 5–15)
BUN: 12 mg/dL (ref 8–23)
CO2: 28 mmol/L (ref 22–32)
Calcium: 9.1 mg/dL (ref 8.9–10.3)
Chloride: 107 mmol/L (ref 98–111)
Creatinine, Ser: 0.85 mg/dL (ref 0.44–1.00)
GFR calc Af Amer: >60 mL/min (ref >60)
GFR calc non Af Amer: >60 mL/min (ref >60)
Glucose, Bld: 107 mg/dL — ABNORMAL HIGH (ref 70–99)
Potassium: 3.7 mmol/L (ref 3.5–5.1)
Sodium: 143 mmol/L (ref 135–145)

## 2019-03-19 LAB — CBC
HCT: 39.7 % (ref 36.0–46.0)
Hemoglobin: 12.6 g/dL (ref 12.0–15.0)
MCH: 30.9 pg (ref 26.0–34.0)
MCHC: 31.7 g/dL (ref 30.0–36.0)
MCV: 97.3 fL (ref 80.0–100.0)
Platelets: 161 10*3/uL (ref 150–400)
RBC: 4.08 MIL/uL (ref 3.87–5.11)
RDW: 12.7 % (ref 11.5–15.5)
WBC: 5.3 10*3/uL (ref 4.0–10.5)
nRBC: 0 % (ref 0.0–0.2)

## 2019-03-19 LAB — CBG MONITORING, ED: Glucose-Capillary: 93 mg/dL (ref 70–99)

## 2019-03-19 MED ORDER — SODIUM CHLORIDE 0.9% FLUSH: 3.0000 mL | Freq: Once | INTRAVENOUS | Status: DC

## 2019-03-19 NOTE — ED Triage Notes (Signed)
Patient states she was sitting in her office last night and got up to go into bedroom and felt herself falling. Patient was trying to keep from falling into her t.v and fell backwards and hit her head on the handle on her desk. Patient states she has a laceration to back of head. Bleeding controled in triage.    Patient states " I guess I lost my balance" Patient is unsure what happened.    [facility]    About 1.5 inch laceration to back of head.   Patient takes 81 mg Asprin every day.

## 2019-03-19 NOTE — ED Provider Notes (Signed)
[facility] DEPT Provider Note   CSN: [id number] Arrival date & time: 03/19/19  [id number]     History   Chief Complaint Chief Complaint  Patient presents with  . Near Syncope  . Fall  . Laceration    HPI [name] is a 68 y.o. female.  Presents emergency room with chief complaint of near syncopal episode.  Patient states last night after she stood up from sitting in her office felt like she was about to pass out and fell backwards, striking her head on furniture.  Unsure if she had complete loss of consciousness.  No bladder bowel incontinence, no ongoing symptoms today.  Noted small mild bleeding after the accident happened.  Takes baby aspirin daily.  No associated chest pain, difficulty breathing.  Currently asymptomatic.  Has been walking without difficulty since that time.     HPI  Past Medical History:  Diagnosis Date  . Acid reflux   . Arthritis   . Asthma   . Hypertension   . Lupus ([place]) 07/13/2011  . 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 PONV (postoperative nausea and vomiting)   . Stroke [facility])    lt side weakness    Patient Active Problem List   Diagnosis Date Noted  . Vaginal dryness 08/24/2018  . 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 Chest pain 05/01/2018  . Asthma 05/01/2018  . Hypertensive disorder 06/02/2017  . 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 Patellar contusion 02/03/2016  . 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 Radiculopathy 12/11/2014  . 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 Lupus erythematosus 07/13/2011  . OBESITY 05/14/2007  . INTERSTITIAL LUNG DISEASE 05/14/2007  . GERD 05/14/2007  . Arthritis 05/14/2007    Past Surgical History:  Procedure Laterality Date  . ABDOMINAL HYSTERECTOMY    . BACK SURGERY    . CHOLECYSTECTOMY    . COLONOSCOPY    . FOOT SURGERY     x2  . MASS EXCISION     back  .  rortator     rotator cuff  . TOTAL SHOULDER ARTHROPLASTY Left 08/16/2018   Procedure: LEFT SHOULDER REVERSE TOTAL SHOULDER ARTHROPLASTY AND BICEPS TENOTOMY;  Surgeon: Tania Ade, MD;  Location: [place];  Service: Orthopedics;  Laterality: Left;     OB History   No obstetric history on file.      Home Medications    Prior to Admission medications   Medication Sig Start Date End Date Taking? Authorizing Provider  acetaminophen (TYLENOL) 500 MG tablet Take 1,000 mg by mouth every 6 (six) hours as needed for mild pain or headache.   Yes [provider]  Biotin 1000 MCG tablet Take 1,000 mcg by mouth daily.   Yes [provider]  cholecalciferol (VITAMIN D) 1000 UNITS tablet Take 1,000 Units by mouth daily.    Yes [provider]  Flaxseed, Linseed, (FLAX SEED OIL PO) Take 1 capsule by mouth daily.   Yes [provider]  furosemide (LASIX) 80 MG tablet Take 80 mg by mouth daily.    Yes [provider]  hydroxychloroquine (PLAQUENIL) 200 MG tablet Take 400 mg by mouth daily.    Yes [provider]  latanoprost (XALATAN) 0.005 % ophthalmic solution Place 1 drop into both eyes at bedtime. 04/13/18  Yes [provider]  losartan (COZAAR) 100 MG tablet Take 100 mg by mouth daily.   Yes [provider]  potassium chloride SA (  K-DUR,KLOR-CON) 20 MEQ tablet Take 2 tablets (40 mEq total) by mouth 2 (two) times daily. 05/03/18  Yes Reubin Milan, MD    Family History Family History  Problem Relation Age of Onset  . Lung disease Father   . Coronary artery disease Brother     Social History Social History   Tobacco Use  . Smoking status: Former Smoker    Packs/day: 1.00    Years: 10.00    Pack years: 10.00    Quit date: 06/17/1994    Years since quitting: 24.7  . Smokeless tobacco: Never Used  Substance Use Topics  . Alcohol use: Not Currently    Alcohol/week: 0.0 standard drinks  . Drug use: No      Allergies   Iodinated diagnostic agents, Ciprofloxacin, E-mycin [erythromycin base], and Oxycodone   Review of Systems Review of Systems  Constitutional: Negative for chills and fever.  HENT: Negative for ear pain and sore throat.   Eyes: Negative for pain and visual disturbance.  Respiratory: Negative for cough and shortness of breath.   Cardiovascular: Negative for chest pain and palpitations.  Gastrointestinal: Negative for abdominal pain and vomiting.  Genitourinary: Negative for dysuria and hematuria.  Musculoskeletal: Negative for arthralgias and back pain.  Skin: Negative for color change and rash.  Neurological: Positive for syncope. Negative for seizures.  All other systems reviewed and are negative.    Physical Exam Updated Vital Signs BP (!) 165/74   Pulse 81   Temp 98 F (36.7 C) (Oral)   Resp 12   SpO2 100%   Physical Exam Vitals signs and nursing note reviewed.  Constitutional:      General: She is not in acute distress.    Appearance: She is well-developed.  HENT:     Head: Normocephalic.     Comments: Superficial abrasion noted to posterior occiput, 2 cm in length Eyes:     Conjunctiva/sclera: Conjunctivae normal.  Neck:     Musculoskeletal: Neck supple.  Cardiovascular:     Rate and Rhythm: Normal rate and regular rhythm.     Heart sounds: No murmur.  Pulmonary:     Effort: Pulmonary effort is normal. No respiratory distress.     Breath sounds: Normal breath sounds.  Abdominal:     Palpations: Abdomen is soft.     Tenderness: There is no abdominal tenderness.  Skin:    General: Skin is warm and dry.  Neurological:     Mental Status: She is alert.      ED Treatments / Results  Labs (all labs ordered are listed, but only abnormal results are displayed) Labs Reviewed  BASIC METABOLIC PANEL  CBC  URINALYSIS, ROUTINE W REFLEX MICROSCOPIC  CBG MONITORING, ED    EKG EKG Interpretation  Date/Time:  Tuesday March 19 2019 09:36:43 EDT  Ventricular Rate:  75 PR Interval:    QRS Duration: 89 QT Interval:  404 QTC Calculation: 452 R Axis:   71 Text Interpretation:  Sinus rhythm Borderline T abnormalities, anterior leads No significant change since last tracing Confirmed by Madalyn Rob [phone]) on 03/19/2019 11:09:04 AM   Radiology Ct Head Wo Contrast  Result Date: 03/19/2019 CLINICAL DATA:  "Patient states she was sitting in her office last night and got up to go into bedroom and felt herself falling. Patient was trying to keep from falling into her t.v and fell backwards and hit her head on the handle on her desk. EXAM: CT HEAD WITHOUT CONTRAST TECHNIQUE: Contiguous axial images were  obtained from the base of the skull through the vertex without intravenous contrast. COMPARISON:  None. FINDINGS: Brain: No evidence of acute infarction, hemorrhage, hydrocephalus, extra-axial collection or mass lesion/mass effect. Vascular: No hyperdense vessel or unexpected calcification. Skull: Normal. Negative for fracture or focal lesion. Sinuses/Orbits: No acute finding. Other: Small amount of soft tissue stranding in the posterior midline left superficial scalp. IMPRESSION: 1. No acute intracranial abnormality. 2. Small amount of soft tissue stranding in the posterior midline/left superficial scalp. Electronically Signed   By: Audie Pinto M.D.   On: 03/19/2019 12:16    Procedures Procedures (including critical care time)  Medications Ordered in ED Medications - No data to display   Initial Impression / Assessment and Plan / ED Course  I have reviewed the triage vital signs and the nursing notes.  Pertinent labs & imaging results that were available during my care of the patient were reviewed by me and considered in my medical decision making (see chart for details).  Clinical Course as of Mar 18 1633  Tue Mar 19, 2019  1303 Reviewed results, will update patient on results and discharge home   [RD]    Clinical Course User  Index [RD] Lucrezia Starch, MD       2-year-old lady who presents after a near syncopal episode, trauma to head.  Here patient was well-appearing with stable vital signs and no ongoing symptoms.  CT head negative, no other signs of focal trauma on my exam, superficial abrasion not requiring repair of her scalp.  Her electrolytes were within normal limits, blood counts within normal limits, EKG without ischemic changes, no cardiac arrhythmia on telemetry monitoring.  Believe she is appropriate for outpatient management, already has previously scheduled appointment with her primary doctor on Thursday which requested patient keep this.  Reviewed return precautions.    After the discussed management above, the patient was determined to be safe for discharge.  The patient was in agreement with this plan and all questions regarding their care were answered.  ED return precautions were discussed and the patient will return to the ED with any significant worsening of condition.    Final Clinical Impressions(s) / ED Diagnoses   Final diagnoses:  Syncope and collapse  Abrasion of scalp, initial encounter    ED Discharge Orders    None       Lucrezia Starch, MD 03/19/19 1635

## 2019-03-19 NOTE — ED Notes (Signed)
Patient transported to CT 

## 2019-03-19 NOTE — ED Notes (Signed)
Pt informed of need for urine.

## 2019-03-19 NOTE — Telephone Encounter (Signed)
Thank you, looks like you already told her: call 911 or have someone take her to the ER ASAP. Nothing further needed.

## 2019-03-19 NOTE — Discharge Instructions (Signed)
Recommend following up with your primary doctor for recheck later this week.  If you develop any further episodes of passing out, develop chest pain, difficulty breathing or other new concerning symptom recommend return to ER for reassessment.

## 2019-03-19 NOTE — Telephone Encounter (Signed)
Pt has called to inform that last night she bumped her head on the handle of a desk and it bled.  Pt was asked if she went to ED she said no but that she thought about it.  Pt asking if she should see Dr Rexene Alberts this morning, pt told Dr Rexene Alberts has nothing today but that an urgent message would be sent to [facility]

## 2019-03-19 NOTE — Telephone Encounter (Signed)
I called pt. She fell backwards last night and hit her head on a furniture piece and it bled. She wanted to know if Dr. Rexene Alberts could see her today. I advised her that with any head injury we recommended that she be checked out emergently through ER or UC. Pt reports that she will go to the ER for assessment ASAP.

## 2019-03-21 DIAGNOSIS — Z7189 Other specified counseling: Secondary | ICD-10-CM | POA: Diagnosis not present

## 2019-03-21 DIAGNOSIS — I10 Essential (primary) hypertension: Secondary | ICD-10-CM | POA: Diagnosis not present

## 2019-03-21 DIAGNOSIS — W19XXXA Unspecified fall, initial encounter: Secondary | ICD-10-CM | POA: Diagnosis not present

## 2019-03-21 DIAGNOSIS — D696 Thrombocytopenia, unspecified: Secondary | ICD-10-CM | POA: Diagnosis not present

## 2019-03-21 DIAGNOSIS — I951 Orthostatic hypotension: Secondary | ICD-10-CM | POA: Diagnosis not present

## 2019-03-21 DIAGNOSIS — J4520 Mild intermittent asthma, uncomplicated: Secondary | ICD-10-CM | POA: Diagnosis not present

## 2019-03-25 ENCOUNTER — Ambulatory Visit (INDEPENDENT_AMBULATORY_CARE_PROVIDER_SITE_OTHER): Payer: Medicare HMO | Admitting: Neurology

## 2019-03-25 DIAGNOSIS — G479 Sleep disorder, unspecified: Secondary | ICD-10-CM

## 2019-03-25 DIAGNOSIS — I951 Orthostatic hypotension: Secondary | ICD-10-CM

## 2019-03-25 DIAGNOSIS — H40023 Open angle with borderline findings, high risk, bilateral: Secondary | ICD-10-CM | POA: Diagnosis not present

## 2019-03-25 DIAGNOSIS — G4710 Hypersomnia, unspecified: Secondary | ICD-10-CM

## 2019-03-25 DIAGNOSIS — H2513 Age-related nuclear cataract, bilateral: Secondary | ICD-10-CM | POA: Diagnosis not present

## 2019-03-25 DIAGNOSIS — G4720 Circadian rhythm sleep disorder, unspecified type: Secondary | ICD-10-CM

## 2019-03-25 DIAGNOSIS — G4489 Other headache syndrome: Secondary | ICD-10-CM

## 2019-03-25 DIAGNOSIS — H04123 Dry eye syndrome of bilateral lacrimal glands: Secondary | ICD-10-CM | POA: Diagnosis not present

## 2019-03-25 DIAGNOSIS — R42 Dizziness and giddiness: Secondary | ICD-10-CM

## 2019-03-25 DIAGNOSIS — R519 Headache, unspecified: Secondary | ICD-10-CM

## 2019-03-28 ENCOUNTER — Other Ambulatory Visit: Payer: Self-pay

## 2019-03-28 ENCOUNTER — Encounter: Payer: Self-pay | Admitting: Cardiology

## 2019-03-28 ENCOUNTER — Ambulatory Visit: Payer: Medicare HMO | Admitting: General Practice

## 2019-03-28 VITALS — BP 146/69 | HR 75 | Temp 97.3°F | Ht 65.0 in | Wt 198.4 lb

## 2019-03-28 DIAGNOSIS — I10 Essential (primary) hypertension: Secondary | ICD-10-CM

## 2019-03-28 DIAGNOSIS — Z6830 Body mass index (BMI) 30.0-30.9, adult: Secondary | ICD-10-CM | POA: Diagnosis not present

## 2019-03-28 DIAGNOSIS — R42 Dizziness and giddiness: Secondary | ICD-10-CM | POA: Diagnosis not present

## 2019-03-28 DIAGNOSIS — I5032 Chronic diastolic (congestive) heart failure: Secondary | ICD-10-CM | POA: Diagnosis not present

## 2019-03-28 DIAGNOSIS — R002 Palpitations: Secondary | ICD-10-CM | POA: Diagnosis not present

## 2019-03-28 DIAGNOSIS — R5383 Other fatigue: Secondary | ICD-10-CM | POA: Diagnosis not present

## 2019-03-28 DIAGNOSIS — R55 Syncope and collapse: Secondary | ICD-10-CM | POA: Diagnosis not present

## 2019-03-28 DIAGNOSIS — E6609 Other obesity due to excess calories: Secondary | ICD-10-CM

## 2019-03-28 MED ORDER — METOPROLOL TARTRATE 25 MG PO TABS: 25.0000 mg | ORAL_TABLET | Freq: Two times a day (BID) | ORAL | 1 refills | Status: DC | PRN

## 2019-03-28 NOTE — Progress Notes (Signed)
Cardiology Clinic Note   Patient Name: [name] Date of Encounter: 03/28/2019  Primary Care Provider:  Merrilee Seashore, MD Primary Cardiologist:  Skeet Latch, MD  Patient Profile    [name] 68 year old female presents to the clinic today for follow-up of her near syncope.  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   PONV (postoperative nausea and vomiting)    Stroke ([facility])    lt side weakness   Past Surgical History:  Procedure Laterality Date   ABDOMINAL HYSTERECTOMY     BACK SURGERY     CHOLECYSTECTOMY     COLONOSCOPY     FOOT SURGERY     x2   MASS EXCISION     back   rortator     rotator cuff   TOTAL SHOULDER ARTHROPLASTY Left 08/16/2018   Procedure: LEFT SHOULDER REVERSE TOTAL SHOULDER ARTHROPLASTY AND BICEPS TENOTOMY;  Surgeon: Tania Ade, MD;  Location: [place];  Service: Orthopedics;  Laterality: Left;    Allergies  Allergies  Allergen Reactions   Iodinated Diagnostic Agents Other (See Comments)    Kidney failure.    Ciprofloxacin Itching   E-Mycin [Erythromycin Base] Nausea And Vomiting   Oxycodone Other (See Comments)    Hallucination.    History of Present Illness    [name] was last seen by Dr. Oval Linsey on 08/28/2018.  During that time she was doing well and denied chest pain.  She had exertional dyspnea but no other complaints at that time.  On 03/19/2019 she presented to the emergency department with a complaint of a near syncopal event.  She stated that when she went from a sitting to standing position she felt like she was going to pass out fell backwards and struck her head on a piece of furniture.  She is not sure if she had a loss of consciousness.  She denied bowel/bladder incontinence or repeat syncopal events while in the ED.  She denied  chest pain or difficulty breathing surrounding the event.  A head CT on 03/19/2019 showed no evidence of acute infarction, hemorrhage, or hydrocephalus extra axial collection or mass lesion.  A small amount of soft tissue stranding in the posterior mid/left superficial scalp was noted.  No EKG changes on 03/20/2019 or 03/19/2019 from previous EKGs.  Her PMH also includes prior stroke, RA, lupus, interstitial lung disease, hypertension, prior lymphoplasmacytoid lymphoma, and asthma.  Her Lexiscan Myoview on 05/2018 showed LVEF 68% with no ischemic changes.  An echocardiogram on 01/2018 showed an LVEF of 60 to 65% with grade 2 diastolic dysfunction.  She presents to the clinic today and states she has had intermittent episodes of dizziness.  She states the dizziness happens infrequently, last for 10 seconds and subsides on its own.  She states that during some episodes she notices palpitations and shortness of breath while with other episodes she does not notice palpitations and may go 1 to 2 months without another episode.  Dr. Ashby Dawes had her wear a 14-day cardiac event monitor which did not show anything except occasional extra beats according to [name].  She states she has been staying well-hydrated, has not lost consciousness with any of her episodes, does not notice room spinning, and does not seem to be affected with body position changes.  She states  she is never had vertigo or vertigo symptoms in the past.  She denies chest pain, increased shortness of breath, increased lower extremity edema, fatigue, melena, hematuria, hemoptysis, diaphoresis, weakness, presyncope, syncope, orthopnea, and PND.   Home Medications    Prior to Admission medications   Medication Sig Start Date End Date Taking? Authorizing Provider  acetaminophen (TYLENOL) 500 MG tablet Take 1,000 mg by mouth every 6 (six) hours as needed for mild pain or headache.    [provider]  Biotin 1000 MCG tablet Take 1,000  mcg by mouth daily.    [provider]  cholecalciferol (VITAMIN D) 1000 UNITS tablet Take 1,000 Units by mouth daily.     [provider]  Flaxseed, Linseed, (FLAX SEED OIL PO) Take 1 capsule by mouth daily.    [provider]  furosemide (LASIX) 80 MG tablet Take 80 mg by mouth daily.     [provider]  hydroxychloroquine (PLAQUENIL) 200 MG tablet Take 400 mg by mouth daily.     [provider]  latanoprost (XALATAN) 0.005 % ophthalmic solution Place 1 drop into both eyes at bedtime. 04/13/18   [provider]  losartan (COZAAR) 100 MG tablet Take 100 mg by mouth daily.    [provider]  potassium chloride SA (K-DUR,KLOR-CON) 20 MEQ tablet Take 2 tablets (40 mEq total) by mouth 2 (two) times daily. 05/03/18   Reubin Milan, MD    Family History    Family History  Problem Relation Age of Onset   Lung disease Father    Coronary artery disease Brother    She indicated that the status of her father is unknown. She indicated that the status of her brother is unknown.  Social History    Social History   Socioeconomic History   Marital status: Married    Spouse name: Not on file   Number of children: Not on file   Years of education: Not on file   Highest education level: Not on file  Occupational History   Not on file  Social Needs   Financial resource strain: Not on file   Food insecurity    Worry: Not on file    Inability: Not on file   Transportation needs    Medical: Not on file    Non-medical: Not on file  Tobacco Use   Smoking status: Former Smoker    Packs/day: 1.00    Years: 10.00    Pack years: 10.00    Quit date: 06/17/1994    Years since quitting: 24.7   Smokeless tobacco: Never Used  Substance and Sexual Activity   Alcohol use: Not Currently    Alcohol/week: 0.0 standard drinks   Drug use: No   Sexual activity: Not Currently  Lifestyle   Physical activity    Days per  week: Not on file    Minutes per session: Not on file   Stress: Not on file  Relationships   Social connections    Talks on phone: Not on file    Gets together: Not on file    Attends religious service: Not on file    Active member of club or organization: Not on file    Attends meetings of clubs or organizations: Not on file    Relationship status: Not on file   Intimate partner violence    Fear of current or ex partner: Not on file    Emotionally abused: Not on file    Physically abused: Not  on file    Forced sexual activity: Not on file  Other Topics Concern   Not on file  Social History Narrative   Not on file     Review of Systems    General:  No chills, fever, night sweats or weight changes.  Cardiovascular:  No chest pain, dyspnea on exertion, edema, orthopnea, palpitations, paroxysmal nocturnal dyspnea. Dermatological: No rash, lesions/masses Respiratory: No cough, dyspnea Urologic: No hematuria, dysuria Abdominal:   No nausea, vomiting, diarrhea, bright red blood per rectum, melena, or hematemesis Neurologic:  No visual changes, wkns, changes in mental status. All other systems reviewed and are otherwise negative except as noted above.  Physical Exam    VS:  BP (!) 146/69    Pulse 75    Temp (!) 97.3 F (36.3 C)    Ht 5\' 5"  (1.651 m)    Wt 198 lb 6.4 oz (90 kg)    SpO2 95%    BMI 33.02 kg/m  , BMI Body mass index is 33.02 kg/m. GEN: Well nourished, well developed, in no acute distress. HEENT: normal. Neck: Supple, no JVD, carotid bruits, or masses. Cardiac: RRR, no murmurs, rubs, or gallops. No clubbing, cyanosis, edema.  Radials/DP/PT 2+ and equal bilaterally.  Respiratory:  Respirations regular and unlabored, clear to auscultation bilaterally. GI: Soft, nontender, nondistended, BS + x 4. MS: no deformity or atrophy. Skin: warm and dry, no rash. Neuro:  Strength and sensation are intact. Psych: Normal affect.  Accessory Clinical Findings    ECG  personally reviewed by me today-none today.  EKG 03/20/2019 Sinus rhythm with borderline anterior T wave abnormalities 75 bpm  EKG 03/19/2019 Sinus rhythm with borderline anterior T wave abnormality 75 bpm  EKG 05/02/2018 Sinus rhythm with nonspecific T wave abnormalities 71 bpm  Echocardiogram7/29/2019 Study Conclusions  - Left ventricle: The cavity size was normal. Wall thickness was   normal. Systolic function was normal. The estimated ejection   fraction was in the range of 60% to 65%. Wall motion was normal;   there were no regional wall motion abnormalities. Features are   consistent with a pseudonormal left ventricular filling pattern,   with concomitant abnormal relaxation and increased filling   pressure (grade 2 diastolic dysfunction). - Mitral valve: Calcified annulus. There was mild regurgitation. - Pericardium, extracardiac: A trivial pericardial effusion was   identified.  Myocardial perfusion study 05/08/2018  Nuclear stress EF: 68%.  There was no ST segment deviation noted during stress.  No T wave inversion was noted during stress.  The study is normal.  This is a low risk study.  The left ventricular ejection fraction is hyperdynamic (>65%).  CT head without contrast 03/19/2019 FINDINGS: Brain: No evidence of acute infarction, hemorrhage, hydrocephalus, extra-axial collection or mass lesion/mass effect.  Vascular: No hyperdense vessel or unexpected calcification.  Skull: Normal. Negative for fracture or focal lesion.  Sinuses/Orbits: No acute finding.  Other: Small amount of soft tissue stranding in the posterior midline left superficial scalp.  IMPRESSION: 1. No acute intracranial abnormality. 2. Small amount of soft tissue stranding in the posterior midline/left superficial scalp  Assessment & Plan     Presyncope- has not experienced any other episodes since her 03/19/2019 presyncopal event.  Occurred in the presence of moving from  sitting to standing position. Moved from the sitting to standing position slowly Wear support stockings to the knee. Monitor p.o. fluid intake-increase p.o. fluid with vigorous physical activity and elevated temperatures Daily weights  Palpitations- intermittent lasting for around 10  seconds per episode, and may have a month between episodes., 14-day cardiac event monitor from Dr. Ashby Dawes did not show any arrhythmias.  Subside at rest on their own. Order metoprolol tartrate 12.5 mg twice daily as needed Consider EP evaluation and possible loop recorder in the future   Chronic diastolic heart failure-echocardiogram 01/2018 showed LVEF 60 to 65% and grade 2 diastolic dysfunction. Continue furosemide 80 mg daily Continue potassium chloride 40 mEq twice daily Daily weights Heart healthy low-sodium diet Increase physical activity as tolerated  Essential hypertension-BP today 146/69. Well controlled at home [id number] systolic over [id number] to 0000000 diastolic.  Orthostatic checked BP today laying 158/79, sitting162/84, standing 172/89 Continue losartan 100 mg tablet daily Heart healthy low-sodium diet Increase physical activity as tolerated Keep blood pressure log and bring to next appointment.  Obesity BMI 33 .02-weight today 198.4 from 202.6 on 08/2018 Heart healthy low-sodium diet Increase physical activity as tolerated Continue weight loss  Disposition: Follow-up with Dr. Oval Linsey or APP in 6 weeks.   Deberah Pelton, NP-C 03/28/2019, 11:56 AM

## 2019-03-28 NOTE — Patient Instructions (Addendum)
Medication Instructions:  TAKE Metoprolol Tartrate 12.5mg  TAKE ON A AS NEEDED BASIS ONLY Take 1 tablet every 12 hours as needed for palpitations  If you need a refill on your cardiac medications before your next appointment, please call your pharmacy.   Lab work: Your physician recommends that you return for lab work in: [place], T3 T4 If you have labs (blood work) drawn today and your tests are completely normal, you will receive your results only by: Mar[place] Kitchen MyChart Message (if you have MyChart) OR . A paper copy in the mail If you have any lab test that is abnormal or we need to change your treatment, we will call you to review the results.  Testing/Procedures: NONE   Follow-Up: At [facility], you and your health needs are our priority.  As part of our continuing mission to provide you with exceptional heart care, we have created designated Provider Care Teams.  These Care Teams include your primary Cardiologist (physician) and Advanced Practice Providers (APPs -  Physician Assistants and Nurse Practitioners) who all work together to provide you with the care you need, when you nee d it. . Your physician recommends that you schedule a follow-up appointment in: [place], NP  Any Other Special Instructions Will Be Listed Below (If Applicable). WEAR COMPRESSION HOSE DURING THE DAY AND REMOVE AT NIGHT/BEDTIME  SALTY 6 HANDOUT GIVEN TO HELP MAKE BETTER FOOD CHOICES

## 2019-03-29 LAB — TSH+T4F+T3FREE
Free T4: 1.02 ng/dL (ref 0.82–1.77)
T3, Free: 2.8 pg/mL (ref 2.0–4.4)
TSH: 2.54 u[IU]/mL (ref 0.450–4.500)

## 2019-04-02 ENCOUNTER — Telehealth: Payer: Self-pay

## 2019-04-02 NOTE — Progress Notes (Signed)
Patient referred by Dr. Ashby Dawes, seen by me on 02/28/19, diagnostic PSG on 03/25/19.   Please call and notify the patient that the recent sleep study did not show any significant obstructive sleep apnea. She slept fairly well, a few mild desaturations, no sign. Snoring.  We will proceed with MRI brain as scheduled and call her after it's reported.   Thanks,  Star Age, MD, PhD [facility])

## 2019-04-02 NOTE — Telephone Encounter (Signed)
-----   Message from Star Age, MD sent at 04/02/2019  8:10 AM EDT ----- Patient referred by Dr. Ashby Dawes, seen by me on 02/28/19, diagnostic PSG on 03/25/19.   Please call and notify the patient that the recent sleep study did not show any significant obstructive sleep apnea. She slept fairly well, a few mild desaturations, no sign. Snoring.  We will proceed with MRI brain as scheduled and call her after it's reported.   Thanks,  Star Age, MD, PhD [facility])

## 2019-04-02 NOTE — Procedures (Signed)
PATIENT'S NAME:  [name], [name] DOB:      21-Mar-1951      MR#:    [id number]     DATE OF RECORDING: 03/25/2019 REFERRING M.D.:  Merrilee Seashore, MD Study Performed:   Baseline Polysomnogram HISTORY: 68 year old woman with a history of asthma, arthritis, status post left shoulder replacement in February 2020, Sjogren's syndrome, asthma, history of lymphoma in remission, low back pain, acid reflux, hypertension, lupus, history of stroke, and obesity, who reports intermittent episodes of lightheadedness, feeling off balance, like she is going to fall. She does not sleep very well, reports snoring intermittently. The patient endorsed the Epworth Sleepiness Scale at 4/24 points. The patient's weight 200 pounds with a height of 65 (inches), resulting in a BMI of 33.4 kg/m2. The patient's neck circumference measured 14 inches.  CURRENT MEDICATIONS: Proventil, Aspirin, Biotin, Symbicort, Vitamin D3, Plaquenil, Xalatan, Cozaar, Multivitamin, Systane, Klor-con   PROCEDURE:  This is a multichannel digital polysomnogram utilizing the Somnostar 11.2 system.  Electrodes and sensors were applied and monitored per AASM Specifications.   EEG, EOG, Chin and Limb EMG, were sampled at 200 Hz.  ECG, Snore and Nasal Pressure, Thermal Airflow, Respiratory Effort, CPAP Flow and Pressure, Oximetry was sampled at 50 Hz. Digital video and audio were recorded.      BASELINE STUDY  Lights Out was at 22:09 and Lights On at 05:01.  Total recording time (TRT) was 412.5 minutes, with a total sleep time (TST) of 362 minutes.   The patient's sleep latency was 21.5 minutes.  REM latency was 225.5 minutes, which is markedly delayed. The sleep efficiency was 87.8 %.     SLEEP ARCHITECTURE: WASO (Wake after sleep onset) was 28.5 minutes with one longer period of wakefulness and otherwise no significant sleep fragmentation noted. There were 3 minutes in Stage N1, 218 minutes Stage N2, 111 minutes Stage N3 and 30 minutes in Stage REM.   The percentage of Stage N1 was .8%, Stage N2 was 60.2%, which is mildly increased, Stage N3 was 30.7%, which is increased, and Stage R (REM sleep) was 8.3%, which is markedly reduced. The arousals were noted as: 37 were spontaneous, 0 were associated with PLMs, 2 were associated with respiratory events.  RESPIRATORY ANALYSIS:  There were a total of 15 respiratory events:  0 obstructive apneas, 0 central apneas and 0 mixed apneas with a total of 0 apneas and an apnea index (AI) of 0 /hour. There were 15 hypopneas with a hypopnea index of 2.5 /hour. The patient also had 0 respiratory event related arousals (RERAs).      The total APNEA/HYPOPNEA INDEX (AHI) was 2.5/hour and the total RESPIRATORY DISTURBANCE INDEX was 2.5 /hour.  1 events occurred in REM sleep and 28 events in NREM. The REM AHI was 2 /hour, versus a non-REM AHI of 2.5. The patient spent 362 minutes of total sleep time in the supine position and 0 minutes in non-supine.. The supine AHI was 2.5 versus a non-supine AHI of 0.0.  OXYGEN SATURATION & C02:  The Wake baseline 02 saturation was 97%, with the lowest being 86%. Time spent below 89% saturation equaled 4 minutes.  PERIODIC LIMB MOVEMENTS: The patient had a total of 0 Periodic Limb Movements.  The Periodic Limb Movement (PLM) index was 0 and the PLM Arousal index was 0/hour.  Audio and video analysis did not show any abnormal or unusual movements, behaviors, phonations or vocalizations. The patient took no bathroom breaks. No significant snoring was noted. The EKG was  in keeping with normal sinus rhythm (NSR).  Post-study, the patient indicated that sleep was better than usual.   IMPRESSION:  1. Dysfunctions associated with sleep stages or arousal from sleep  RECOMMENDATIONS:  1. This study does not demonstrate any significant obstructive or central sleep disordered breathing. Her AHI was 2.5/hour for the night.  2. This study does not support an intrinsic sleep disorder as a  cause of the patient's symptoms. Other causes, including circadian rhythm disturbances, an underlying mood disorder, medication effect and/or an underlying medical problem cannot be ruled out. 3. This study shows no significant sleep fragmentation and mildly abnormal sleep stage percentages; these are nonspecific findings and per se do not signify an intrinsic sleep disorder or a cause for the patient's sleep-related symptoms. Causes include (but are not limited to) the first night effect of the sleep study, circadian rhythm disturbances, medication effect or an underlying mood disorder or medical problem.  4. The patient should be cautioned not to drive, work at heights, or operate dangerous or heavy equipment when tired or sleepy. Review and reiteration of good sleep hygiene measures should be pursued with any patient.  5. The patient will be advised to follow up with the referring provider, who will be notified of the test results.  I certify that I have reviewed the entire raw data recording prior to the issuance of this report in accordance with the Standards of Accreditation of the American Academy of Sleep Medicine (AASM)    Star Age, MD, PhD Diplomat, American Board of Neurology and Sleep Medicine (Neurology and Sleep Medicine)

## 2019-04-02 NOTE — Telephone Encounter (Signed)
I called pt. I discussed her sleep study results and recommendations. Pt verbalized understanding of results. Pt had no questions at this time but was encouraged to call back if questions arise.

## 2019-04-03 ENCOUNTER — Ambulatory Visit
Admission: RE | Admit: 2019-04-03 | Discharge: 2019-04-03 | Disposition: A | Discharge status: Home / Self Care | Payer: Medicare HMO | Source: Ambulatory Visit | Attending: Neurology | Admitting: Neurology

## 2019-04-03 DIAGNOSIS — G4489 Other headache syndrome: Secondary | ICD-10-CM | POA: Diagnosis not present

## 2019-04-03 DIAGNOSIS — I951 Orthostatic hypotension: Secondary | ICD-10-CM

## 2019-04-03 DIAGNOSIS — R519 Headache, unspecified: Secondary | ICD-10-CM

## 2019-04-03 DIAGNOSIS — R42 Dizziness and giddiness: Secondary | ICD-10-CM | POA: Diagnosis not present

## 2019-04-03 DIAGNOSIS — G479 Sleep disorder, unspecified: Secondary | ICD-10-CM

## 2019-04-03 MED ORDER — GADOBENATE DIMEGLUMINE 529 MG/ML IV SOLN
20.0000 mL | Freq: Once | INTRAVENOUS | Status: AC | PRN
  Administered 2019-04-03: 20 mL via INTRAVENOUS

## 2019-04-04 ENCOUNTER — Telehealth: Payer: Self-pay

## 2019-04-04 NOTE — Telephone Encounter (Signed)
-----   Message from Star Age, MD sent at 04/04/2019  5:28 PM EDT ----- Please call patient regarding the recent brain MRI: The brain scan showed a normal structure of the brain and mild volume loss which we call atrophy. There were changes in the deeper structures of the brain, which we call white matter changes or microvascular changes. These were reported as mild in Her case. These are tiny white spots, that occur with time and are seen in a variety of conditions, including with normal aging, chronic hypertension, chronic headaches, especially migraine HAs, chronic diabetes, chronic hyperlipidemia. These are not strokes and no mass or lesion or contrast enhancement were seen which is reassuring. Again, there were no acute findings, such as a stroke, or mass or blood products. No further action is required on this test at this time, other than re-enforcing the importance of good blood pressure control, good cholesterol control, good blood sugar control, and weight management. Please Advised patient that she can follow-up with her primary care physician.  There is no obvious primary neurological cause for her dizziness. I would like to remind her to stay well-hydrated with water, 6 to 8 cups/day are recommended.  7 to 8 hours of sleep are recommended.

## 2019-04-04 NOTE — Progress Notes (Signed)
Please call patient regarding the recent brain MRI: The brain scan showed a normal structure of the brain and mild volume loss which we call atrophy. There were changes in the deeper structures of the brain, which we call white matter changes or microvascular changes. These were reported as mild in Her case. These are tiny white spots, that occur with time and are seen in a variety of conditions, including with normal aging, chronic hypertension, chronic headaches, especially migraine HAs, chronic diabetes, chronic hyperlipidemia. These are not strokes and no mass or lesion or contrast enhancement were seen which is reassuring. Again, there were no acute findings, such as a stroke, or mass or blood products. No further action is required on this test at this time, other than re-enforcing the importance of good blood pressure control, good cholesterol control, good blood sugar control, and weight management. Please Advised patient that she can follow-up with her primary care physician.  There is no obvious primary neurological cause for her dizziness. I would like to remind her to stay well-hydrated with water, 6 to 8 cups/day are recommended.  7 to 8 hours of sleep are recommended.

## 2019-04-04 NOTE — Telephone Encounter (Signed)
I called pt and discussed her MRI results and recommendations. Pt will continue to follow up with her PCP. Pt verbalized understanding of results. Pt had no questions at this time but was encouraged to call back if questions arise.

## 2019-05-02 DIAGNOSIS — M064 Inflammatory polyarthropathy: Secondary | ICD-10-CM | POA: Diagnosis not present

## 2019-05-02 DIAGNOSIS — M3501 Sicca syndrome with keratoconjunctivitis: Secondary | ICD-10-CM | POA: Diagnosis not present

## 2019-05-02 DIAGNOSIS — Z79899 Other long term (current) drug therapy: Secondary | ICD-10-CM | POA: Diagnosis not present

## 2019-05-02 DIAGNOSIS — M150 Primary generalized (osteo)arthritis: Secondary | ICD-10-CM | POA: Diagnosis not present

## 2019-05-08 NOTE — Progress Notes (Signed)
Cardiology Clinic Note   Patient Name: [name] Date of Encounter: 05/09/2019  Primary Care Provider:  Merrilee Seashore, MD Primary Cardiologist:  Skeet Latch, MD  Patient Profile    [name] 68 year old female presents to the clinic today for follow-up of her near syncope and HTN.  Past Medical History    Past Medical History:  Diagnosis Date  . Acid reflux   . Arthritis   . Asthma   . Hypertension   . Lupus ([place]) 07/13/2011  . 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 PONV (postoperative nausea and vomiting)   . Stroke [facility])    lt side weakness   Past Surgical History:  Procedure Laterality Date  . ABDOMINAL HYSTERECTOMY    . BACK SURGERY    . CHOLECYSTECTOMY    . COLONOSCOPY    . FOOT SURGERY     x2  . MASS EXCISION     back  . rortator     rotator cuff  . TOTAL SHOULDER ARTHROPLASTY Left 08/16/2018   Procedure: LEFT SHOULDER REVERSE TOTAL SHOULDER ARTHROPLASTY AND BICEPS TENOTOMY;  Surgeon: Tania Ade, MD;  Location: [facility];  Service: Orthopedics;  Laterality: Left;    Allergies  Allergies  Allergen Reactions  . Iodinated Diagnostic Agents Other (See Comments)    Kidney failure.   . Ciprofloxacin Itching  . E-Mycin [Erythromycin Base] Nausea And Vomiting  . Oxycodone Other (See Comments)    Hallucination.    History of Present Illness    [name] has a past medical history of CVA, RA, lupus, interstitial lung disease, hypertension, prior lymphoplasmacytoid lymphoma, asthma, and near syncope.  Her Lexiscan Myoview on 05/2018 showed an LVEF of 68% with no ischemic changes.  Her echocardiogram from 01/2018 showed an LVEF of 60 to 65% with grade 2 diastolic dysfunction.  On 03/19/2019 she presented to the ED with a complaint of near syncope.  He stated that when she went from a sitting to standing position felt like she was going to pass out and fell backwards striking her head on a piece of furniture.  She was not  sure if she had a loss of consciousness.  She denied bowel bladder incontinence or repeat syncopal events while in the ED.  She denied chest pain or difficulty breathing surrounding the event.  A CT head on 03/19/2019 showed no evidence of a acute infarction, hemorrhage or hydrocephalus.  Small amount of soft tissue stranding in the position of mid/left superficial scalp was noted.  No EKG changes on 03/20/2019 or 03/19/2019 from previous EKGs.  She was seen by me on 03/28/2019 and stated she had intermittent episodes of dizziness.  She stated that her dizziness was infrequent, lasted for about 10 seconds with each episode and would subside on its own.  She also indicated that during her episodes of dizziness she had occasionally had palpitations and shortness of breath.  She also indicated that there may be 1 to 2 months between episodes.  Dr. Ashby Dawes had her wear a 14-day cardiac event monitor which did not show any events except for occasional extra beats according to [name].  She stated she had been staying well-hydrated and had not noticed any vertigo type symptoms with these events.  During her last visit she was prescribed metoprolol tartrate 12.5 mg twice daily as needed for palpitations and instructed to increase p.o. hydration, and wear support stockings to help with her presyncopal events.  She presents the clinic today and states she  feels well.  She states she has had 1 episode of dizziness in the past 2 months.  She states that the dizziness happened while she was standing talking to a friend, came on suddenly, she sat down and the dizziness subsided within 30 to 40 seconds.  She continues to wear her support hose, monitor her weights, and maintain her p.o. intake.  She states that her blood pressure has been slightly elevated during the occurrences where she has monitored it.  I will change her metoprolol today from as needed to 12.5 mg twice daily.  She denies chest pain, shortness of  breath, lower extremity edema, fatigue, palpitations, melena, hematuria, hemoptysis, diaphoresis, weakness, syncope, orthopnea, and PND.   Home Medications    Prior to Admission medications   Medication Sig Start Date End Date Taking? Authorizing Provider  acetaminophen (TYLENOL) 500 MG tablet Take 1,000 mg by mouth every 6 (six) hours as needed for mild pain or headache.    [provider]  aspirin 81 MG chewable tablet Chew by mouth daily.    [provider]  Biotin 1000 MCG tablet Take 1,000 mcg by mouth daily.    [provider]  cholecalciferol (VITAMIN D) 1000 UNITS tablet Take 1,000 Units by mouth daily.     [provider]  Flaxseed, Linseed, (FLAX SEED OIL PO) Take 1 capsule by mouth daily.    [provider]  furosemide (LASIX) 80 MG tablet Take 80 mg by mouth daily.     [provider]  hydroxychloroquine (PLAQUENIL) 200 MG tablet Take 400 mg by mouth daily.     [provider]  latanoprost (XALATAN) 0.005 % ophthalmic solution Place 1 drop into both eyes at bedtime. 04/13/18   [provider]  losartan (COZAAR) 100 MG tablet Take 100 mg by mouth daily.    [provider]  metoprolol tartrate (LOPRESSOR) 25 MG tablet Take 1 tablet (25 mg total) by mouth 2 (two) times daily as needed (FOR PALPITATIONS ONLY). 03/28/19 06/26/19  Erlene Quan, PA-C  potassium chloride SA (K-DUR,KLOR-CON) 20 MEQ tablet Take 2 tablets (40 mEq total) by mouth 2 (two) times daily. 05/03/18   Reubin Milan, MD  Turmeric (QC TUMERIC COMPLEX) 500 MG CAPS Take by mouth.    [provider]    Family History    Family History  Problem Relation Age of Onset  . Lung disease Father   . Coronary artery disease Brother    She indicated that the status of her father is unknown. She indicated that the status of her brother is unknown.  Social History    Social History   Socioeconomic History  . Marital status:  Married    Spouse name: Not on file  . Number of children: Not on file  . Years of education: Not on file  . Highest education level: Not on file  Occupational History  . Not on file  Social Needs  . Financial resource strain: Not on file  . Food insecurity    Worry: Not on file    Inability: Not on file  . Transportation needs    Medical: Not on file    Non-medical: Not on file  Tobacco Use  . Smoking status: Former Smoker    Packs/day: 1.00    Years: 10.00    Pack years: 10.00    Quit date: 06/17/1994    Years since quitting: 24.9  . Smokeless tobacco: Never Used  Substance and Sexual Activity  .  Alcohol use: Not Currently    Alcohol/week: 0.0 standard drinks  . Drug use: No  . Sexual activity: Not Currently  Lifestyle  . Physical activity    Days per week: Not on file    Minutes per session: Not on file  . Stress: Not on file  Relationships  . Social Herbalist on phone: Not on file    Gets together: Not on file    Attends religious service: Not on file    Active member of club or organization: Not on file    Attends meetings of clubs or organizations: Not on file    Relationship status: Not on file  . Intimate partner violence    Fear of current or ex partner: Not on file    Emotionally abused: Not on file    Physically abused: Not on file    Forced sexual activity: Not on file  Other Topics Concern  . Not on file  Social History Narrative  . Not on file     Review of Systems    General:  No chills, fever, night sweats or weight changes.  Cardiovascular:  No chest pain, dyspnea on exertion, edema, orthopnea, palpitations, paroxysmal nocturnal dyspnea. Dermatological: No rash, lesions/masses Respiratory: No cough, dyspnea Urologic: No hematuria, dysuria Abdominal:   No nausea, vomiting, diarrhea, bright red blood per rectum, melena, or hematemesis Neurologic:  No visual changes, wkns, changes in mental status. All other systems reviewed and  are otherwise negative except as noted above.  Physical Exam    VS:  BP (!) 149/72   Pulse 77   Ht 5\' 5"  (1.651 m)   Wt 200 lb 9.6 oz (91 kg)   SpO2 99%   BMI 33.38 kg/m  , BMI Body mass index is 33.38 kg/m. GEN: Well nourished, well developed, in no acute distress. HEENT: normal. Neck: Supple, no JVD, carotid bruits, or masses. Cardiac: RRR, no murmurs, rubs, or gallops. No clubbing, cyanosis, edema.  Radials/DP/PT 2+ and equal bilaterally.  Respiratory:  Respirations regular and unlabored, clear to auscultation bilaterally. GI: Soft, nontender, nondistended, BS + x 4. MS: no deformity or atrophy. Skin: warm and dry, no rash. Neuro:  Strength and sensation are intact. Psych: Normal affect.  Accessory Clinical Findings    ECG personally reviewed by me today-none today.  EKG 03/20/2019 Sinus rhythm with borderline anterior T wave abnormalities 75 bpm  EKG 03/19/2019 Sinus rhythm with borderline anterior T wave abnormality 75 bpm  EKG 05/02/2018 Sinus rhythm with nonspecific T wave abnormalities 71 bpm  Echocardiogram7/29/2019 Study Conclusions  - Left ventricle: The cavity size was normal. Wall thickness was normal. Systolic function was normal. The estimated ejection fraction was in the range of 60% to 65%. Wall motion was normal; there were no regional wall motion abnormalities. Features are consistent with a pseudonormal left ventricular filling pattern, with concomitant abnormal relaxation and increased filling pressure (grade 2 diastolic dysfunction). - Mitral valve: Calcified annulus. There was mild regurgitation. - Pericardium, extracardiac: A trivial pericardial effusion was identified.  Myocardial perfusion study 05/08/2018  Nuclear stress EF: 68%.  There was no ST segment deviation noted during stress.  No T wave inversion was noted during stress.  The study is normal.  This is a low risk study.  The left ventricular ejection  fraction is hyperdynamic (>65%).  CT head without contrast 03/19/2019 FINDINGS: Brain: No evidence of acute infarction, hemorrhage, hydrocephalus, extra-axial collection or mass lesion/mass effect.  Vascular: No hyperdense vessel or  unexpected calcification.  Skull: Normal. Negative for fracture or focal lesion.  Sinuses/Orbits: No acute finding.  Other: Small amount of soft tissue stranding in the posterior midline left superficial scalp.  IMPRESSION: 1. No acute intracranial abnormality. 2. Small amount of soft tissue stranding in the posterior midline/left superficial scalp   Assessment & Plan   Presyncope- has had 1 episode since her 03/28/2019. Occurred in the presence of standing talking to a friend.  Episode lasted for 30 to 40 seconds and subsided with rest Move from the sitting to standing position slowly Wear support stockings to the knee. Monitor p.o. fluid intake-maintain p.o. fluid with vigorous physical activity and elevated temperatures Continue Daily weights  Palpitations- intermittent lasting for around 10 seconds per episode, and may have a month between episodes., 14-day cardiac event monitor from Dr. Ashby Dawes did not show any arrhythmias.  Subside at rest on their own. Start taking metoprolol tartrate 12.5 mg twice daily  Consider EP evaluation and possible loop recorder in the future if there is an increase with episodes/frequencies  Chronic diastolic heart failure-echocardiogram 01/2018 showed LVEF 60 to 65% and grade 2 diastolic dysfunction. Continue furosemide 80 mg daily Continue potassium chloride 40 mEq twice daily Daily weights Heart healthy low-sodium diet-salty 6 given Increase physical activity as tolerated  Essential hypertension-BP today 149/72.  Similar controll at home over the last month and a half [id number] systolic over [id number] to [id number] diastolic.   Continue losartan 100 mg tablet daily Start taking metoprolol tartrate 12.5 mg twice  daily Heart healthy low-sodium diet-salty 6 given. Increase physical activity as tolerated Keep blood pressure log and bring to next appointment.  Obesity BMI 33 .38-weight today 200.6 from 202.6 on 08/2018 Heart healthy low-sodium diet Increase physical activity as tolerated Continue weight loss  Disposition: Follow-up with Dr. Oval Linsey in 3 months.  Jossie Ng. [facility] [address] Office [phone] Fax [phone]

## 2019-05-09 ENCOUNTER — Ambulatory Visit (INDEPENDENT_AMBULATORY_CARE_PROVIDER_SITE_OTHER): Payer: Medicare HMO | Admitting: General Practice

## 2019-05-09 ENCOUNTER — Other Ambulatory Visit: Payer: Self-pay

## 2019-05-09 ENCOUNTER — Encounter: Payer: Self-pay | Admitting: General Practice

## 2019-05-09 VITALS — BP 149/72 | HR 77 | Ht 65.0 in | Wt 200.6 lb

## 2019-05-09 DIAGNOSIS — I5032 Chronic diastolic (congestive) heart failure: Secondary | ICD-10-CM | POA: Diagnosis not present

## 2019-05-09 DIAGNOSIS — R42 Dizziness and giddiness: Secondary | ICD-10-CM

## 2019-05-09 DIAGNOSIS — I10 Essential (primary) hypertension: Secondary | ICD-10-CM

## 2019-05-09 DIAGNOSIS — Z6830 Body mass index (BMI) 30.0-30.9, adult: Secondary | ICD-10-CM | POA: Diagnosis not present

## 2019-05-09 DIAGNOSIS — R55 Syncope and collapse: Secondary | ICD-10-CM | POA: Diagnosis not present

## 2019-05-09 DIAGNOSIS — E6609 Other obesity due to excess calories: Secondary | ICD-10-CM | POA: Diagnosis not present

## 2019-05-09 DIAGNOSIS — R002 Palpitations: Secondary | ICD-10-CM | POA: Diagnosis not present

## 2019-05-09 MED ORDER — METOPROLOL TARTRATE 25 MG PO TABS: 25.0000 mg | ORAL_TABLET | Freq: Two times a day (BID) | ORAL | 3 refills | Status: DC

## 2019-05-09 MED ORDER — METOPROLOL TARTRATE 25 MG PO TABS: 12.5000 mg | ORAL_TABLET | Freq: Two times a day (BID) | ORAL | 3 refills | Status: DC

## 2019-05-09 NOTE — Patient Instructions (Addendum)
Special Instructions: PLEASE FOLLOW SALTY 6 HANDOUT THAT IS ATTACHED  Medication Instructions:  START TAKING METOPROLOL TARTRATE 12.5MG  TWICE DAILY  If you need a refill on your cardiac medications before your next a Follow-Up: IN February 2021 Please call our office 2 months in advance, [place] 2020 to schedule this appointment. Either In Person or Virtual You may see Skeet Latch, MD Coletta Memos, FNP or one of the following Advanced Practice Providers on your designated Care Team:  Kerin Ransom, PA-C  [place], [place]  Coletta Memos, [place].    At [facility], you and your health needs are our priority.  As part of our continuing mission to provide you with exceptional heart care, we have created designated Provider Care Teams.  These Care Teams include your primary Cardiologist (physician) and Advanced Practice Providers (APPs -  Physician Assistants and Nurse Practitioners) who all work together to provide you with the care you need, when you need it.  Thank you for choosing [facility] at [facility]!!

## 2019-05-20 ENCOUNTER — Telehealth: Payer: Self-pay | Admitting: General Practice

## 2019-05-20 NOTE — Telephone Encounter (Signed)
Called patient, I advised of correct instructions- 12.5 mg twice daily- which they consider to be 25 mg daily. Patient verbalized understanding.

## 2019-05-20 NOTE — Telephone Encounter (Signed)
Pt c/o medication issue:  1. Name of Medication: metoprolol tartrate (LOPRESSOR) 25 MG tablet  2. How are you currently taking this medication (dosage and times per day)? 0.5 tablet twice daily  3. Are you having a reaction (difficulty breathing--STAT)? no  4. What is your medication issue? The patient picked up her medication from the pharmacy, and the instructions printed on the bottle were to take a full tablet twice daily, but she remembered Denyse Amass telling her to only take half a tablet twice daily. She is taking it as Denyse Amass instructed. She wanted to make [facility] aware in case she really is supposed to be taking a full tablet 2x daily

## 2019-06-05 ENCOUNTER — Telehealth: Payer: Self-pay | Admitting: Cardiovascular Disease

## 2019-06-05 NOTE — Telephone Encounter (Signed)
New Message  STAT if patient feels like he/she is going to faint   1) Are you dizzy now? No; dizziness when she got up this morning  2) Do you feel faint or have you passed out? Weird feeling  3) Do you have any other symptoms? nausea  4) Have you checked your HR and BP (record if available)? 154/73; 60 HR  142/68; 58HR  Attempted to call triage; got no answer Please call pt asap.

## 2019-06-05 NOTE — Telephone Encounter (Signed)
Returned call to patient of Dr. Oval Linsey She reports upon getting up, the room started spinning.  She got up and went to bathroom and went back to bed She tried to get up an hour later and same thing happened - room spinning She reports taking her position changes slowly She reports feeling nauseous, gagging on phlegm but no vomiting She states "I can't describe the feeling" but "not really woozy" She reports "not feeling right"  Her BP/HR are OK She has no issues with blood sugar She has shortness of breath when walking short distances that may have worsened/more fatigued  Message routed to MD to review and advise

## 2019-06-05 NOTE — Telephone Encounter (Signed)
Having increased fatigue, this has been since starting Metoprolol  She did start Methotrexate about the same time She is dizzy with positional changes but does not feel like the vertigo she has had in the past.  Denies dizziness while just sitting.  Will forward to [place] since Dr Oval Linsey out of the office

## 2019-06-06 NOTE — Telephone Encounter (Signed)
    Lets go ahead and change her metoprolol back to 12.5 mg as needed for palpitations and schedule her for an appointment in the next 1 to 2 weeks. Please have her bring in her BP log when she comes in for her appt. Thank you    Above per Denyse Amass. Advised patient, verbalized understanding, and scheduled follow up visit

## 2019-06-13 DIAGNOSIS — M3501 Sicca syndrome with keratoconjunctivitis: Secondary | ICD-10-CM | POA: Diagnosis not present

## 2019-06-13 DIAGNOSIS — Z79899 Other long term (current) drug therapy: Secondary | ICD-10-CM | POA: Diagnosis not present

## 2019-06-13 DIAGNOSIS — M064 Inflammatory polyarthropathy: Secondary | ICD-10-CM | POA: Diagnosis not present

## 2019-06-13 DIAGNOSIS — M150 Primary generalized (osteo)arthritis: Secondary | ICD-10-CM | POA: Diagnosis not present

## 2019-06-20 NOTE — Progress Notes (Signed)
Cardiology Clinic Note   Patient Name: [name] Date of Encounter: 06/21/2019  Primary Care Provider:  Merrilee Seashore, MD Primary Cardiologist:  Skeet Latch, MD  Patient Profile    [name] 68 year old female presents to the clinic today for follow-up of her near syncope and HTN.  Past Medical History    Past Medical History:  Diagnosis Date  . Acid reflux   . Arthritis   . Asthma   . Hypertension   . Lupus ([place]) 07/13/2011  . 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 PONV (postoperative nausea and vomiting)   . Stroke [facility])    lt side weakness   Past Surgical History:  Procedure Laterality Date  . ABDOMINAL HYSTERECTOMY    . BACK SURGERY    . CHOLECYSTECTOMY    . COLONOSCOPY    . FOOT SURGERY     x2  . MASS EXCISION     back  . rortator     rotator cuff  . TOTAL SHOULDER ARTHROPLASTY Left 08/16/2018   Procedure: LEFT SHOULDER REVERSE TOTAL SHOULDER ARTHROPLASTY AND BICEPS TENOTOMY;  Surgeon: Tania Ade, MD;  Location: [facility];  Service: Orthopedics;  Laterality: Left;    Allergies  Allergies  Allergen Reactions  . Iodinated Diagnostic Agents Other (See Comments)    Kidney failure.   . Ciprofloxacin Itching  . E-Mycin [Erythromycin Base] Nausea And Vomiting  . Oxycodone Other (See Comments)    Hallucination.    History of Present Illness    [name] has a past medical history of CVA, RA, lupus, interstitial lung disease, hypertension, prior lymphoplasmacytoid lymphoma, asthma, and near syncope.  Her Lexiscan Myoview on 05/2018 showed an LVEF of 68% with no ischemic changes.  Her echocardiogram from 01/2018 showed an LVEF of 60 to 65% with grade 2 diastolic dysfunction.  On 03/19/2019 she presented to the ED with a complaint of near syncope.  He stated that when she went from a sitting to standing position felt like she was going to pass out and fell backwards striking her head on a piece of furniture.  She was not  sure if she had a loss of consciousness.  She denied bowel bladder incontinence or repeat syncopal events while in the ED.  She denied chest pain or difficulty breathing surrounding the event.  A CT head on 03/19/2019 showed no evidence of a acute infarction, hemorrhage or hydrocephalus.  Small amount of soft tissue stranding in the position of mid/left superficial scalp was noted.  No EKG changes on 03/20/2019 or 03/19/2019 from previous EKGs.  She was seen by me on 03/28/2019 and stated she had intermittent episodes of dizziness.  She stated that her dizziness was infrequent, lasted for about 10 seconds with each episode and would subside on its own.  She also indicated that during her episodes of dizziness she had occasionally had palpitations and shortness of breath.  She also indicated that there may be 1 to 2 months between episodes.  Dr. Ashby Dawes had her wear a 14-day cardiac event monitor which did not show any events except for occasional extra beats according to [name].  She stated she had been staying well-hydrated and had not noticed any vertigo type symptoms with these events.  During her last visit she was prescribed metoprolol tartrate 12.5 mg twice daily as needed for palpitations and instructed to increase p.o. hydration, and wear support stockings to help with her presyncopal events.  She presented to the clinic 05/09/2019 and stated  she felt well.  She stated she  had 1 episode of dizziness in the past 2 months.  She stated that the dizziness happened while she was standing talking to a friend, came on suddenly, she sat down and the dizziness subsided within 30 to 40 seconds.  She continued to wear her support hose, monitor her weights, and maintain her p.o. intake.  She stated that her blood pressure has been slightly elevated during the occurrences where she had monitored it.  I increased her metoprolol  from as needed to 12.5 mg twice daily.  She contacted the office on 06/05/2019 and  indicated that she was having increased fatigue since starting the metoprolol, was dizzy with position changes and with sitting down.  Her metoprolol was changed back to 12.5 mg as needed.  She presents the clinic today and states she feels much better since stopping the metoprolol medication.  She has had one episode of lightheadedness that lasted for about a minute.  She sat down and the lightheadedness went away.  She described being very active shopping prior to the event, eating lunch and then standing in the kitchen with her sister immediately after eating.  Her blood pressures have been well controlled according to her log.  She states she has also been having trouble sleeping around 4 times per week.  She wakes at 3-4 AM and has a hard time going back to sleep.  I will have her continue to wear her support stockings, have her maintain her p.o. fluid intake, give her sleep hygiene instructions.  She denies chest pain, shortness of breath, lower extremity edema, fatigue, palpitations, melena, hematuria, hemoptysis, diaphoresis, weakness, syncope, orthopnea, and PND.    Home Medications    Prior to Admission medications   Medication Sig Start Date End Date Taking? Authorizing Provider  acetaminophen (TYLENOL) 500 MG tablet Take 1,000 mg by mouth every 6 (six) hours as needed for mild pain or headache.    [provider]  aspirin 81 MG chewable tablet Chew by mouth daily.    [provider]  Biotin 1000 MCG tablet Take 1,000 mcg by mouth daily.    [provider]  cholecalciferol (VITAMIN D) 1000 UNITS tablet Take 1,000 Units by mouth daily.     [provider]  Flaxseed, Linseed, (FLAX SEED OIL PO) Take 1 capsule by mouth daily.    [provider]  folic acid (FOLVITE) 1 MG tablet Take 1 tablet by mouth daily. 05/02/19   [provider]  furosemide (LASIX) 80 MG tablet Take 80 mg by mouth daily.     [provider]   hydroxychloroquine (PLAQUENIL) 200 MG tablet Take 400 mg by mouth daily.     [provider]  latanoprost (XALATAN) 0.005 % ophthalmic solution Place 1 drop into both eyes at bedtime. 04/13/18   [provider]  losartan (COZAAR) 100 MG tablet Take 100 mg by mouth daily.    [provider]  methotrexate 2.5 MG tablet Take 1 tablet by mouth once a week. 05/02/19   [provider]  metoprolol tartrate (LOPRESSOR) 25 MG tablet Take 12.5 mg by mouth as needed (for palpitations).     [provider]  potassium chloride SA (K-DUR,KLOR-CON) 20 MEQ tablet Take 2 tablets (40 mEq total) by mouth 2 (two) times daily. 05/03/18   Reubin Milan, MD  Turmeric (QC TUMERIC COMPLEX) 500 MG CAPS Take by mouth.    [provider]    Family History  Family History  Problem Relation Age of Onset  . Lung disease Father   . Coronary artery disease Brother    She indicated that the status of her father is unknown. She indicated that the status of her brother is unknown.  Social History    Social History   Socioeconomic History  . Marital status: Married    Spouse name: Not on file  . Number of children: Not on file  . Years of education: Not on file  . Highest education level: Not on file  Occupational History  . Not on file  Tobacco Use  . Smoking status: Former Smoker    Packs/day: 1.00    Years: 10.00    Pack years: 10.00    Quit date: 06/17/1994    Years since quitting: 25.0  . Smokeless tobacco: Never Used  Substance and Sexual Activity  . Alcohol use: Not Currently    Alcohol/week: 0.0 standard drinks  . Drug use: No  . Sexual activity: Not Currently  Other Topics Concern  . Not on file  Social History Narrative  . Not on file   Social Determinants of Health   Financial Resource Strain:   . Difficulty of Paying Living Expenses: Not on file  Food Insecurity:   . Worried About Charity fundraiser in the Last Year: Not on  file  . Ran Out of Food in the Last Year: Not on file  Transportation Needs:   . Lack of Transportation (Medical): Not on file  . Lack of Transportation (Non-Medical): Not on file  Physical Activity:   . Days of Exercise per Week: Not on file  . Minutes of Exercise per Session: Not on file  Stress:   . Feeling of Stress : Not on file  Social Connections:   . Frequency of Communication with Friends and Family: Not on file  . Frequency of Social Gatherings with Friends and Family: Not on file  . Attends Religious Services: Not on file  . Active Member of Clubs or Organizations: Not on file  . Attends Archivist Meetings: Not on file  . Marital Status: Not on file  Intimate Partner Violence:   . Fear of Current or Ex-Partner: Not on file  . Emotionally Abused: Not on file  . Physically Abused: Not on file  . Sexually Abused: Not on file     Review of Systems    General:  No chills, fever, night sweats or weight changes.  Cardiovascular:  No chest pain, dyspnea on exertion, edema, orthopnea, palpitations, paroxysmal nocturnal dyspnea. Dermatological: No rash, lesions/masses Respiratory: No cough, dyspnea Urologic: No hematuria, dysuria Abdominal:   No nausea, vomiting, diarrhea, bright red blood per rectum, melena, or hematemesis Neurologic:  No visual changes, wkns, changes in mental status. All other systems reviewed and are otherwise negative except as noted above.  Physical Exam    VS:  BP 140/73 (BP Location: Left Arm, Patient Position: Sitting, Cuff Size: Large)   Pulse 74   Ht 5\' 5"  (1.651 m)   Wt 196 lb 12.8 oz (89.3 kg)   BMI 32.75 kg/m  , BMI Body mass index is 32.75 kg/m. GEN: Well nourished, well developed, in no acute distress. HEENT: normal. Neck: Supple, no JVD, carotid bruits, or masses. Cardiac: RRR, no murmurs, rubs, or gallops. No clubbing, cyanosis, generalized nonpitting bilateral lower extremity edema.  Radials/DP/PT 2+ and equal bilaterally.   Respiratory:  Respirations regular and unlabored, clear to auscultation bilaterally. GI: Soft, nontender, nondistended,  BS + x 4. MS: no deformity or atrophy. Skin: warm and dry, no rash. Neuro:  Strength and sensation are intact. Psych: Normal affect.  Accessory Clinical Findings    ECG personally reviewed by me today-none today  EKG 03/20/2019 Sinus rhythm with borderline anterior T wave abnormalities 75 bpm  EKG 03/19/2019 Sinus rhythm with borderline anterior T wave abnormality 75 bpm  EKG 05/02/2018 Sinus rhythm with nonspecific T wave abnormalities 71 bpm  Echocardiogram7/29/2019 Study Conclusions  - Left ventricle: The cavity size was normal. Wall thickness was normal. Systolic function was normal. The estimated ejection fraction was in the range of 60% to 65%. Wall motion was normal; there were no regional wall motion abnormalities. Features are consistent with a pseudonormal left ventricular filling pattern, with concomitant abnormal relaxation and increased filling pressure (grade 2 diastolic dysfunction). - Mitral valve: Calcified annulus. There was mild regurgitation. - Pericardium, extracardiac: A trivial pericardial effusion was identified.  Myocardial perfusion study 05/08/2018  Nuclear stress EF: 68%.  There was no ST segment deviation noted during stress.  No T wave inversion was noted during stress.  The study is normal.  This is a low risk study.  The left ventricular ejection fraction is hyperdynamic (>65%).  CT head without contrast 03/19/2019 FINDINGS: Brain: No evidence of acute infarction, hemorrhage, hydrocephalus, extra-axial collection or mass lesion/mass effect.  Vascular: No hyperdense vessel or unexpected calcification.  Skull: Normal. Negative for fracture or focal lesion.  Sinuses/Orbits: No acute finding.  Other: Small amount of soft tissue stranding in the posterior midline left superficial scalp.   IMPRESSION: 1. No acute intracranial abnormality. 2. Small amount of soft tissue stranding in the posterior midline/left superficial scalp  Assessment & Plan   Presyncope-has had 2 episode since her 05/09/2019 clinic visit.Occurred in the presence of standing talking to her sister after eating.  Episode lasted for 60 seconds and subsided with rest.  This seems to be postprandial in nature. Move from the sitting to standing position slowly Wear support stockingsto the knee. Monitor p.o. fluid intake-maintain p.o. fluid with vigorous physical activity and elevated temperatures Continue Daily weights No prolonged standing or physical activity for 30 to 45 minutes postmeal.  Palpitations-no episodes since last visit.  14-day cardiac event monitor from Dr. Lynnea Ferrier not show any arrhythmias.  Continue taking metoprolol tartrate 12.5 mg as needed Consider EP evaluation and possible loop recorder in the future if there is an increase with episodes/frequencies  Chronic diastolic heart failure-echocardiogram 01/2018 showed LVEF 60 to 65% and grade 2 diastolic dysfunction.  Weight today 196.8 down from 202.6 pounds 08/2018 Continue furosemide 80 mg daily Continue potassium chloride 40 mEq twice daily Daily weights Heart healthy low-sodium diet-salty 6 given Increase physical activity as tolerated  Essential hypertension-BP 140/73 today.    Well controlled at home Continue losartan 100 mg tablet daily Continue metoprolol tartrate 12.5 mg as needed Heart healthy low-sodium diet-salty 6 given. Increase physical activity as tolerated Keep blood pressure log and bring to next appointment.  Obesity BMI 33.38-weight today 196.8 from 202.6 on 08/2018 Heart healthy low-sodium diet Increase physical activity as tolerated Continue weight loss  Sleep disturbance-describes interrupted sleep around 4 times a week.  Sleep study 03/2019 showed dysfunction associated with sleep stages or arousal  from sleep.  It did not demonstrate OSA or intrinsic sleep disorder. Sleep hygiene instructions given  Disposition: Follow up with Dr. Oval Linsey in 3 months.   Jossie Ng. [facility] [phone] Fax [phone]

## 2019-06-21 ENCOUNTER — Ambulatory Visit (INDEPENDENT_AMBULATORY_CARE_PROVIDER_SITE_OTHER): Payer: Medicare HMO | Admitting: General Practice

## 2019-06-21 ENCOUNTER — Other Ambulatory Visit: Payer: Self-pay

## 2019-06-21 ENCOUNTER — Encounter: Payer: Self-pay | Admitting: General Practice

## 2019-06-21 VITALS — BP 140/73 | HR 74 | Ht 65.0 in | Wt 196.8 lb

## 2019-06-21 DIAGNOSIS — I5032 Chronic diastolic (congestive) heart failure: Secondary | ICD-10-CM | POA: Diagnosis not present

## 2019-06-21 DIAGNOSIS — E6609 Other obesity due to excess calories: Secondary | ICD-10-CM | POA: Diagnosis not present

## 2019-06-21 DIAGNOSIS — G479 Sleep disorder, unspecified: Secondary | ICD-10-CM | POA: Diagnosis not present

## 2019-06-21 DIAGNOSIS — Z6830 Body mass index (BMI) 30.0-30.9, adult: Secondary | ICD-10-CM | POA: Diagnosis not present

## 2019-06-21 DIAGNOSIS — I10 Essential (primary) hypertension: Secondary | ICD-10-CM | POA: Diagnosis not present

## 2019-06-21 DIAGNOSIS — R42 Dizziness and giddiness: Secondary | ICD-10-CM

## 2019-06-21 DIAGNOSIS — R55 Syncope and collapse: Secondary | ICD-10-CM

## 2019-06-21 DIAGNOSIS — R002 Palpitations: Secondary | ICD-10-CM

## 2019-06-21 NOTE — Patient Instructions (Signed)
Special Instructions: NO PROLONGED STANDING OR ACTIVITY FOR 30-45 MINUTES AFTER EATING.  PLEASE READ AND FOLLOW SALTY 6 ATTACHED  PLEASE READ AND FOLLOW SLEEP HYGIENE ATTACHED  Reduce your risk of getting COVID-19 With your heart disease it is especially important for people at increased risk of severe illness from COVID-19, and those who live with them, to protect themselves from getting COVID-19. The best way to protect yourself and to help reduce the spread of the virus that causes COVID-19 is to: Marland Kitchen Limit your interactions with other people as much as possible. . Take precautions to prevent getting COVID-19 when you do interact with others. If you start feeling sick and think you may have COVID-19, get in touch with your healthcare provider within 24 hours.  Follow-Up: PLEASE KEEP SCHEDULED APPT In Person Skeet Latch, MD.    At [facility], you and your health needs are our priority.  As part of our continuing mission to provide you with exceptional heart care, we have created designated Provider Care Teams.  These Care Teams include your primary Cardiologist (physician) and Advanced Practice Providers (APPs -  Physician Assistants and Nurse Practitioners) who all work together to provide you with the care you need, when you need it.  Thank you for choosing [facility] at [facility]!!     Happy Holidays!!    Quality Sleep Information, Adult Quality sleep is important for your mental and physical health. It also improves your quality of life. Quality sleep means you:  Are asleep for most of the time you are in bed.  Fall asleep within 30 minutes.  Wake up no more than once a night.  Are awake for no longer than 20 minutes if you do wake up during the night. Most adults need 7-8 hours of quality sleep each night. How can poor sleep affect me? If you do not get enough quality sleep, you may have:  Mood swings.  Daytime sleepiness.  Confusion.  Decreased  reaction time.  Sleep disorders, such as insomnia and sleep apnea.  Difficulty with: ? Solving problems. ? Coping with stress. ? Paying attention. These issues may affect your performance and productivity at work, school, and at home. Lack of sleep may also put you at higher risk for accidents, suicide, and risky behaviors. If you do not get quality sleep you may also be at higher risk for several health problems, including:  Infections.  Type 2 diabetes.  Heart disease.  High blood pressure.  Obesity.  Worsening of long-term conditions, like arthritis, kidney disease, depression, Parkinson's disease, and epilepsy. What actions can I take to get more quality sleep?      Stick to a sleep schedule. Go to sleep and wake up at about the same time each day. Do not try to sleep less on weekdays and make up for lost sleep on weekends. This does not work.  Try to get about 30 minutes of exercise on most days. Do not exercise 2-3 hours before going to bed.  Limit naps during the day to 30 minutes or less.  Do not use any products that contain nicotine or tobacco, such as cigarettes or e-cigarettes. If you need help quitting, ask your health care provider.  Do not drink caffeinated beverages for at least 8 hours before going to bed. Coffee, tea, and some sodas contain caffeine.  Do not drink alcohol close to bedtime.  Do not eat large meals close to bedtime.  Do not take naps in the late afternoon.  Try to get at least 30 minutes of sunlight every day. Morning sunlight is best.  Make time to relax before bed. Reading, listening to music, or taking a hot bath promotes quality sleep.  Make your bedroom a place that promotes quality sleep. Keep your bedroom dark, quiet, and at a comfortable room temperature. Make sure your bed is comfortable. Take out sleep distractions like TV, a computer, smartphone, and bright lights.  If you are lying awake in bed for longer than 20 minutes,  get up and do a relaxing activity until you feel sleepy.  Work with your health care provider to treat medical conditions that may affect sleeping, such as: ? Nasal obstruction. ? Snoring. ? Sleep apnea and other sleep disorders.  Talk to your health care provider if you think any of your prescription medicines may cause you to have difficulty falling or staying asleep.  If you have sleep problems, talk with a sleep consultant. If you think you have a sleep disorder, talk with your health care provider about getting evaluated by a specialist. Where to find more information  [place] website: https://sleepfoundation.org  National Heart, Lung, and [place] ([place]): http://www.saunders.info/.pdf  Centers for Disease Control and Prevention (CDC): LearningDermatology.pl Contact a health care provider if you:  Have trouble getting to sleep or staying asleep.  Often wake up very early in the morning and cannot get back to sleep.  Have daytime sleepiness.  Have daytime sleep attacks of suddenly falling asleep and sudden muscle weakness (narcolepsy).  Have a tingling sensation in your legs with a strong urge to move your legs (restless legs syndrome).  Stop breathing briefly during sleep (sleep apnea).  Think you have a sleep disorder or are taking a medicine that is affecting your quality of sleep. Summary  Most adults need 7-8 hours of quality sleep each night.  Getting enough quality sleep is an important part of health and well-being.  Make your bedroom a place that promotes quality sleep and avoid things that may cause you to have poor sleep, such as alcohol, caffeine, smoking, and large meals.  Talk to your health care provider if you have trouble falling asleep or staying asleep. This information is not intended to replace advice given to you by your health care provider. Make sure you discuss any questions you have  with your health care provider.

## 2019-07-18 DIAGNOSIS — D696 Thrombocytopenia, unspecified: Secondary | ICD-10-CM | POA: Diagnosis not present

## 2019-07-18 DIAGNOSIS — I10 Essential (primary) hypertension: Secondary | ICD-10-CM | POA: Diagnosis not present

## 2019-07-18 DIAGNOSIS — J4520 Mild intermittent asthma, uncomplicated: Secondary | ICD-10-CM | POA: Diagnosis not present

## 2019-07-25 DIAGNOSIS — I10 Essential (primary) hypertension: Secondary | ICD-10-CM | POA: Diagnosis not present

## 2019-07-25 DIAGNOSIS — R1310 Dysphagia, unspecified: Secondary | ICD-10-CM | POA: Diagnosis not present

## 2019-07-25 DIAGNOSIS — K2100 Gastro-esophageal reflux disease with esophagitis, without bleeding: Secondary | ICD-10-CM | POA: Diagnosis not present

## 2019-07-25 DIAGNOSIS — R0982 Postnasal drip: Secondary | ICD-10-CM | POA: Diagnosis not present

## 2019-07-25 DIAGNOSIS — M329 Systemic lupus erythematosus, unspecified: Secondary | ICD-10-CM | POA: Diagnosis not present

## 2019-08-02 DIAGNOSIS — M7742 Metatarsalgia, left foot: Secondary | ICD-10-CM | POA: Diagnosis not present

## 2019-08-02 DIAGNOSIS — D2372 Other benign neoplasm of skin of left lower limb, including hip: Secondary | ICD-10-CM | POA: Diagnosis not present

## 2019-08-02 DIAGNOSIS — M21961 Unspecified acquired deformity of right lower leg: Secondary | ICD-10-CM | POA: Diagnosis not present

## 2019-08-02 DIAGNOSIS — M7741 Metatarsalgia, right foot: Secondary | ICD-10-CM | POA: Diagnosis not present

## 2019-08-02 DIAGNOSIS — D239 Other benign neoplasm of skin, unspecified: Secondary | ICD-10-CM | POA: Diagnosis not present

## 2019-08-02 DIAGNOSIS — M21962 Unspecified acquired deformity of left lower leg: Secondary | ICD-10-CM | POA: Diagnosis not present

## 2019-08-02 DIAGNOSIS — M79672 Pain in left foot: Secondary | ICD-10-CM | POA: Diagnosis not present

## 2019-08-13 ENCOUNTER — Ambulatory Visit: Payer: Medicare Other | Admitting: Cardiovascular Disease

## 2019-08-13 ENCOUNTER — Other Ambulatory Visit: Payer: Self-pay

## 2019-08-13 VITALS — BP 120/68 | HR 67 | Temp 96.8°F | Ht 66.0 in | Wt 198.2 lb

## 2019-08-13 DIAGNOSIS — R55 Syncope and collapse: Secondary | ICD-10-CM

## 2019-08-13 DIAGNOSIS — I5032 Chronic diastolic (congestive) heart failure: Secondary | ICD-10-CM | POA: Diagnosis not present

## 2019-08-13 DIAGNOSIS — D239 Other benign neoplasm of skin, unspecified: Secondary | ICD-10-CM | POA: Diagnosis not present

## 2019-08-13 NOTE — Progress Notes (Signed)
Cardiology Office Note   Date:  08/30/2019   ID:  [name], DOB [birth year], MRN [id number]  PCP:  Merrilee Seashore, MD  Cardiologist:   Skeet Latch, MD   No chief complaint on file.    History of Present Illness: [name] is a 69 y.o. female with prior stroke, rheumatoid arthritis, lupus, interstitial lung disease, hypertension, prior lymphoplasmacytoid lymphoma, and asthma here for follow up.  She was initially seen in the ED 04/2018 with chest pain.  She reported L shoulder and chest wall pain.  EKG revealed diffuse T wave inversions.  Cardiac enzymes were negative x3.  She was referred for outpatient stress testing and had a Lexiscan Myoview 05/07/2018 that revealed LVEF 68%.  There was no ischemia.  She previously had an echo 01/2018 that revealed LVEF 60 to 65% with grade 2 diastolic dysfunction.    [name] had an episode of near syncope 03/2019.  Since then metoprolol was stopped and she only takes as needed.  She has not had any palpitations and therefore has not needed to use it lately.  Her husband has been sick for the last week.  He had a diarrheal illness and was admitted to the hospital with intravascular volume depletion.  She is very stressed and he is still not very active at home.  She has been feeling well physically.  She has no chest pain or shortness of breath.  She was exercising with a stairstepper but stopped doing this when he went to the hospital.  She has no exertional symptoms.  She does notice some lightheadedness when she looks up or tries to reach above her head.  She denies any recent syncope.   Past Medical History:  Diagnosis Date  . Acid reflux   . Arthritis   . Asthma   . Chronic diastolic heart failure ([place]) 08/30/2019  . Hypertension   . Lupus ([place]) 07/13/2011  . 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 Near syncope 08/30/2019  . PONV (postoperative nausea and vomiting)   . Stroke [facility])    lt side weakness     Past Surgical History:  Procedure Laterality Date  . ABDOMINAL HYSTERECTOMY    . BACK SURGERY    . CHOLECYSTECTOMY    . COLONOSCOPY    . FOOT SURGERY     x2  . MASS EXCISION     back  . rortator     rotator cuff  . TOTAL SHOULDER ARTHROPLASTY Left 08/16/2018   Procedure: LEFT SHOULDER REVERSE TOTAL SHOULDER ARTHROPLASTY AND BICEPS TENOTOMY;  Surgeon: Tania Ade, MD;  Location: [place];  Service: Orthopedics;  Laterality: Left;     Current Outpatient Medications  Medication Sig Dispense Refill  . acetaminophen (TYLENOL) 500 MG tablet Take 1,000 mg by mouth every 6 (six) hours as needed for mild pain or headache.    Marland Kitchen aspirin 81 MG chewable tablet Chew by mouth daily.    . Biotin 1000 MCG tablet Take 1,000 mcg by mouth daily.    . cholecalciferol (VITAMIN D) 1000 UNITS tablet Take 1,000 Units by mouth daily.     . Flaxseed, Linseed, (FLAX SEED OIL PO) Take 1 capsule by mouth daily.    . folic acid (FOLVITE) 1 MG tablet Take 1 tablet by mouth daily.    . furosemide (LASIX) 80 MG tablet Take 80 mg by mouth daily. OK TO TAKE AN EXTRA 1/2 AS NEEDED FOR SWELLING/WT GAIN    . hydroxychloroquine (PLAQUENIL) 200 MG  tablet Take 400 mg by mouth daily.     Marland Kitchen latanoprost (XALATAN) 0.005 % ophthalmic solution Place 1 drop into both eyes at bedtime.    Marland Kitchen losartan (COZAAR) 100 MG tablet Take 100 mg by mouth daily.    . methotrexate 2.5 MG tablet Take 1 tablet by mouth once a week.    . metoprolol tartrate (LOPRESSOR) 25 MG tablet Take 12.5 mg by mouth as needed (for palpitations).     . potassium chloride SA (K-DUR,KLOR-CON) 20 MEQ tablet Take 2 tablets (40 mEq total) by mouth 2 (two) times daily.    . Turmeric (QC TUMERIC COMPLEX) 500 MG CAPS Take by mouth.    . rosuvastatin (CRESTOR) 20 MG tablet Take 1 tablet (20 mg total) by mouth daily. 90 tablet 3   No current facility-administered medications for this visit.    Allergies:   Iodinated diagnostic agents, Ciprofloxacin, E-mycin  [erythromycin base], and Oxycodone    Social History:  The patient  reports that she quit smoking about 25 years ago. She has a 10.00 pack-year smoking history. She has never used smokeless tobacco. She reports previous alcohol use. She reports that she does not use drugs.   Family History:  The patient's family history includes Coronary artery disease in her brother; Lung disease in her father.    ROS:  Please see the history of present illness.   Otherwise, review of systems are positive for none.   All other systems are reviewed and negative.    PHYSICAL EXAM: VS:  BP 120/68   Pulse 67   Temp (!) 96.8 F (36 C)   Ht 5\' 6"  (1.676 m)   Wt 198 lb 3.2 oz (89.9 kg)   SpO2 97%   BMI 31.99 kg/m  , BMI Body mass index is 31.99 kg/m. GENERAL:  Well appearing HEENT: Pupils equal round and reactive, fundi not visualized, oral mucosa unremarkable NECK:  No jugular venous distention, waveform within normal limits, carotid upstroke brisk and symmetric, no bruits, no thyromegaly LYMPHATICS:  No cervical adenopathy LUNGS:  Clear to auscultation bilaterally HEART:  RRR.  PMI not displaced or sustained,S1 and S2 within normal limits, no S3, no S4, no clicks, no rubs, no murmurs ABD:  Flat, positive bowel sounds normal in frequency in pitch, no bruits, no rebound, no guarding, no midline pulsatile mass, no hepatomegaly, no splenomegaly EXT:  2 plus pulses throughout, no edema, no cyanosis no clubbing SKIN:  No rashes no nodules NEURO:  Cranial nerves II through XII grossly intact, motor grossly intact throughout PSYCH:  Cognitively intact, oriented to person place and time   EKG:  EKG is not ordered today.   Lexiscan Myoview 05/07/18:  Nuclear stress EF: 68%.  There was no ST segment deviation noted during stress.  No T wave inversion was noted during stress.  The study is normal.  This is a low risk study.  The left ventricular ejection fraction is hyperdynamic (>65%).   Echo  01/29/18: Study Conclusions  - Left ventricle: The cavity size was normal. Wall thickness was   normal. Systolic function was normal. The estimated ejection   fraction was in the range of 60% to 65%. Wall motion was normal;   there were no regional wall motion abnormalities. Features are   consistent with a pseudonormal left ventricular filling pattern,   with concomitant abnormal relaxation and increased filling   pressure (grade 2 diastolic dysfunction). - Mitral valve: Calcified annulus. There was mild regurgitation. - Pericardium, extracardiac: A  trivial pericardial effusion was   identified.  Impressions:  - Normal LV systolic function; moderate diastolic dysfunction;   mild MR.  Recent Labs: 03/19/2019: BUN 12; Creatinine, Ser 0.85; Hemoglobin 12.6; Platelets 161; Potassium 3.7; Sodium 143 03/28/2019: TSH 2.540    Lipid Panel No results found for: CHOL, TRIG, HDL, CHOLHDL, VLDL, LDLCALC, LDLDIRECT   09/05/17: Total cholesterol 212, triglycerides 50, HDL 76, LDL 126    Wt Readings from Last 3 Encounters:  08/13/19 198 lb 3.2 oz (89.9 kg)  06/21/19 196 lb 12.8 oz (89.3 kg)  05/09/19 200 lb 9.6 oz (91 kg)      ASSESSMENT AND PLAN:  # Near syncope: Occurs when reaching overhead or looking up.  Check carotid Dopplers.  She is not orthostatic.  # Chest pain: Resolved.  Likely musculoskeletal.  Lexiscan Myoview was negative for ischemia 04/2018.  # Shortness of breath: # Chronic diastolic heart failure: [name] is euvolemic.  Her breathing is stable.  She has improved since stopping metoprolol.  Continue blood pressure control with losartan.  She occasionally has increased edema and is able to take an extra 40 mg of Lasix as needed.  # Hypertension: BP well-controlled.  Continue metoprolol, losartan and lasix.    Current medicines are reviewed at length with the patient today.  The patient does not have concerns regarding medicines.  The following changes have  been made:  no change  Labs/ tests ordered today include:   Orders Placed This Encounter  Procedures  . VAS US CAROTID     Disposition:   FU with Harvin Konicek C. Oval Linsey, MD, [facility] in 1 year.    Signed, Mohammad Granade C. Oval Linsey, MD, [facility]  08/30/2019 3:38 PM    [place]

## 2019-08-13 NOTE — Patient Instructions (Signed)
Medication Instructions:  OK TO TAKE AN EXTRA 1/2 FUROSEMIDE AS NEEDED FOR WEIGHT GAIN OF 2-3 POUNDS IN 24 HOURS/5 POUNDS IN A WEEK OR SWELLING/EDEMA   *If you need a refill on your cardiac medications before your next appointment, please call your pharmacy*  Lab Work: NONE    Testing/Procedures: Your physician has requested that you have a carotid duplex. This test is an ultrasound of the carotid arteries in your neck. It looks at blood flow through these arteries that supply the brain with blood. Allow one hour for this exam. There are no restrictions or special instructions.  Follow-Up: At [facility], you and your health needs are our priority.  As part of our continuing mission to provide you with exceptional heart care, we have created designated Provider Care Teams.  These Care Teams include your primary Cardiologist (physician) and Advanced Practice Providers (APPs -  Physician Assistants and Nurse Practitioners) who all work together to provide you with the care you need, when you need it.  Your next appointment:   12 month(s)  You will receive a reminder letter in the mail two months in advance. If you don't receive a letter, please call our office to schedule the follow-up appointment.  The format for your next appointment:   Virtual Visit   Provider:   You may see Skeet Latch, MD or one of the following Advanced Practice Providers on your designated Care Team:    Kerin Ransom, PA-C  [place], [place]  Coletta Memos, [place]

## 2019-08-14 DIAGNOSIS — Z471 Aftercare following joint replacement surgery: Secondary | ICD-10-CM | POA: Diagnosis not present

## 2019-08-14 DIAGNOSIS — Z96612 Presence of left artificial shoulder joint: Secondary | ICD-10-CM | POA: Diagnosis not present

## 2019-08-14 DIAGNOSIS — M19211 Secondary osteoarthritis, right shoulder: Secondary | ICD-10-CM | POA: Diagnosis not present

## 2019-08-14 DIAGNOSIS — M19012 Primary osteoarthritis, left shoulder: Secondary | ICD-10-CM | POA: Diagnosis not present

## 2019-08-20 DIAGNOSIS — S9032XA Contusion of left foot, initial encounter: Secondary | ICD-10-CM | POA: Diagnosis not present

## 2019-08-20 DIAGNOSIS — R609 Edema, unspecified: Secondary | ICD-10-CM | POA: Diagnosis not present

## 2019-08-20 DIAGNOSIS — D2372 Other benign neoplasm of skin of left lower limb, including hip: Secondary | ICD-10-CM | POA: Diagnosis not present

## 2019-08-21 ENCOUNTER — Other Ambulatory Visit: Payer: Self-pay

## 2019-08-21 ENCOUNTER — Ambulatory Visit (HOSPITAL_COMMUNITY)
Admission: RE | Admit: 2019-08-21 | Discharge: 2019-08-21 | Disposition: A | Discharge status: Home / Self Care | Payer: Medicare Other | Source: Ambulatory Visit | Attending: Cardiology | Admitting: Cardiology

## 2019-08-21 DIAGNOSIS — R55 Syncope and collapse: Secondary | ICD-10-CM | POA: Diagnosis not present

## 2019-08-26 ENCOUNTER — Encounter: Payer: Self-pay | Admitting: *Deleted

## 2019-08-26 ENCOUNTER — Telehealth: Payer: Self-pay | Admitting: *Deleted

## 2019-08-26 DIAGNOSIS — I6523 Occlusion and stenosis of bilateral carotid arteries: Secondary | ICD-10-CM

## 2019-08-26 DIAGNOSIS — R55 Syncope and collapse: Secondary | ICD-10-CM

## 2019-08-26 DIAGNOSIS — I10 Essential (primary) hypertension: Secondary | ICD-10-CM

## 2019-08-26 DIAGNOSIS — Z5181 Encounter for therapeutic drug level monitoring: Secondary | ICD-10-CM

## 2019-08-26 MED ORDER — ROSUVASTATIN CALCIUM 20 MG PO TABS: 20.0000 mg | ORAL_TABLET | Freq: Every day | ORAL | 3 refills | Status: DC

## 2019-08-26 NOTE — Progress Notes (Signed)
Patient ID: [name], female   DOB: 09/06/1950, 68 y.o.   MRN: [id number] Patient enrolled for [employer] to ship a 30 day cardiac event monitor to her home.  Instructions will be included in kit and were also sent via My Chart message. 

## 2019-08-26 NOTE — Telephone Encounter (Signed)
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  This does not explain near syncope.  LDL cholesterol should be <70 given that she has some blockage.  Recommend rosuvastatin 20mg  and repeat lipids/cmp in 3 moths.  30 day monitor given her recurrent episodes of near syncope.

## 2019-08-26 NOTE — Telephone Encounter (Signed)
Advised patient of results  Mailed lab order, sent message to scheduling about monitor, and Rx sent to [facility]

## 2019-08-27 DIAGNOSIS — M84375A Stress fracture, left foot, initial encounter for fracture: Secondary | ICD-10-CM | POA: Diagnosis not present

## 2019-08-27 DIAGNOSIS — R609 Edema, unspecified: Secondary | ICD-10-CM | POA: Diagnosis not present

## 2019-08-29 DIAGNOSIS — M329 Systemic lupus erythematosus, unspecified: Secondary | ICD-10-CM | POA: Diagnosis not present

## 2019-08-29 DIAGNOSIS — C8300 Small cell B-cell lymphoma, unspecified site: Secondary | ICD-10-CM | POA: Diagnosis not present

## 2019-08-29 DIAGNOSIS — D649 Anemia, unspecified: Secondary | ICD-10-CM | POA: Diagnosis not present

## 2019-08-29 DIAGNOSIS — K2100 Gastro-esophageal reflux disease with esophagitis, without bleeding: Secondary | ICD-10-CM | POA: Diagnosis not present

## 2019-08-29 DIAGNOSIS — R531 Weakness: Secondary | ICD-10-CM | POA: Diagnosis not present

## 2019-08-29 DIAGNOSIS — Z79899 Other long term (current) drug therapy: Secondary | ICD-10-CM | POA: Diagnosis not present

## 2019-08-29 DIAGNOSIS — R42 Dizziness and giddiness: Secondary | ICD-10-CM | POA: Diagnosis not present

## 2019-08-29 DIAGNOSIS — N390 Urinary tract infection, site not specified: Secondary | ICD-10-CM | POA: Diagnosis not present

## 2019-08-29 DIAGNOSIS — R55 Syncope and collapse: Secondary | ICD-10-CM | POA: Diagnosis not present

## 2019-08-30 ENCOUNTER — Encounter: Payer: Self-pay | Admitting: Cardiovascular Disease

## 2019-08-30 DIAGNOSIS — R55 Syncope and collapse: Secondary | ICD-10-CM

## 2019-08-30 DIAGNOSIS — I5032 Chronic diastolic (congestive) heart failure: Secondary | ICD-10-CM

## 2019-08-30 HISTORY — DX: Chronic diastolic (congestive) heart failure: I50.32

## 2019-08-30 HISTORY — DX: Syncope and collapse: R55

## 2019-09-04 ENCOUNTER — Ambulatory Visit (INDEPENDENT_AMBULATORY_CARE_PROVIDER_SITE_OTHER): Payer: Medicare Other

## 2019-09-04 DIAGNOSIS — R55 Syncope and collapse: Secondary | ICD-10-CM | POA: Diagnosis not present

## 2019-09-12 DIAGNOSIS — C8300 Small cell B-cell lymphoma, unspecified site: Secondary | ICD-10-CM | POA: Diagnosis not present

## 2019-09-12 DIAGNOSIS — I5032 Chronic diastolic (congestive) heart failure: Secondary | ICD-10-CM | POA: Diagnosis not present

## 2019-09-12 DIAGNOSIS — R7989 Other specified abnormal findings of blood chemistry: Secondary | ICD-10-CM | POA: Diagnosis not present

## 2019-09-12 DIAGNOSIS — M329 Systemic lupus erythematosus, unspecified: Secondary | ICD-10-CM | POA: Diagnosis not present

## 2019-09-12 DIAGNOSIS — Z79899 Other long term (current) drug therapy: Secondary | ICD-10-CM | POA: Diagnosis not present

## 2019-09-12 DIAGNOSIS — R55 Syncope and collapse: Secondary | ICD-10-CM | POA: Diagnosis not present

## 2019-09-12 DIAGNOSIS — R531 Weakness: Secondary | ICD-10-CM | POA: Diagnosis not present

## 2019-09-12 DIAGNOSIS — R7889 Finding of other specified substances, not normally found in blood: Secondary | ICD-10-CM | POA: Diagnosis not present

## 2019-09-17 DIAGNOSIS — G5761 Lesion of plantar nerve, right lower limb: Secondary | ICD-10-CM | POA: Diagnosis not present

## 2019-09-17 DIAGNOSIS — M84375D Stress fracture, left foot, subsequent encounter for fracture with routine healing: Secondary | ICD-10-CM | POA: Diagnosis not present

## 2019-09-17 DIAGNOSIS — G5762 Lesion of plantar nerve, left lower limb: Secondary | ICD-10-CM | POA: Diagnosis not present

## 2019-10-01 DIAGNOSIS — R7301 Impaired fasting glucose: Secondary | ICD-10-CM | POA: Diagnosis not present

## 2019-10-01 DIAGNOSIS — M329 Systemic lupus erythematosus, unspecified: Secondary | ICD-10-CM | POA: Diagnosis not present

## 2019-10-01 DIAGNOSIS — Z0000 Encounter for general adult medical examination without abnormal findings: Secondary | ICD-10-CM | POA: Diagnosis not present

## 2019-10-01 DIAGNOSIS — N390 Urinary tract infection, site not specified: Secondary | ICD-10-CM | POA: Diagnosis not present

## 2019-10-01 DIAGNOSIS — I10 Essential (primary) hypertension: Secondary | ICD-10-CM | POA: Diagnosis not present

## 2019-10-10 DIAGNOSIS — G5761 Lesion of plantar nerve, right lower limb: Secondary | ICD-10-CM | POA: Diagnosis not present

## 2019-10-10 DIAGNOSIS — R609 Edema, unspecified: Secondary | ICD-10-CM | POA: Diagnosis not present

## 2019-10-10 DIAGNOSIS — G5762 Lesion of plantar nerve, left lower limb: Secondary | ICD-10-CM | POA: Diagnosis not present

## 2019-10-10 DIAGNOSIS — M84375D Stress fracture, left foot, subsequent encounter for fracture with routine healing: Secondary | ICD-10-CM | POA: Diagnosis not present

## 2019-10-11 DIAGNOSIS — Z0000 Encounter for general adult medical examination without abnormal findings: Secondary | ICD-10-CM | POA: Diagnosis not present

## 2019-10-11 DIAGNOSIS — M064 Inflammatory polyarthropathy: Secondary | ICD-10-CM | POA: Diagnosis not present

## 2019-10-11 DIAGNOSIS — M329 Systemic lupus erythematosus, unspecified: Secondary | ICD-10-CM | POA: Diagnosis not present

## 2019-10-11 DIAGNOSIS — Z23 Encounter for immunization: Secondary | ICD-10-CM | POA: Diagnosis not present

## 2019-10-11 DIAGNOSIS — I5032 Chronic diastolic (congestive) heart failure: Secondary | ICD-10-CM | POA: Diagnosis not present

## 2019-10-11 DIAGNOSIS — M3501 Sicca syndrome with keratoconjunctivitis: Secondary | ICD-10-CM | POA: Diagnosis not present

## 2019-10-22 ENCOUNTER — Telehealth: Payer: Self-pay | Admitting: Cardiovascular Disease

## 2019-10-22 ENCOUNTER — Other Ambulatory Visit: Payer: Self-pay | Admitting: Internal Medicine

## 2019-10-22 DIAGNOSIS — Z1231 Encounter for screening mammogram for malignant neoplasm of breast: Secondary | ICD-10-CM

## 2019-10-22 NOTE — Telephone Encounter (Signed)
Follow Up:     Pt wants to know if her Monitor resuklts are ready?

## 2019-10-22 NOTE — Telephone Encounter (Signed)
Left message letting pt know we are waiting for results to be reviewed and we will call as soon as we get those.  Advised to call back if any questions.

## 2019-10-25 ENCOUNTER — Other Ambulatory Visit: Payer: Self-pay

## 2019-10-25 ENCOUNTER — Ambulatory Visit
Admission: RE | Admit: 2019-10-25 | Discharge: 2019-10-25 | Disposition: A | Discharge status: Home / Self Care | Payer: Medicare Other | Source: Ambulatory Visit | Attending: Internal Medicine | Admitting: Internal Medicine

## 2019-10-25 DIAGNOSIS — Z1231 Encounter for screening mammogram for malignant neoplasm of breast: Secondary | ICD-10-CM

## 2019-10-31 DIAGNOSIS — M21962 Unspecified acquired deformity of left lower leg: Secondary | ICD-10-CM | POA: Diagnosis not present

## 2019-10-31 DIAGNOSIS — M84375D Stress fracture, left foot, subsequent encounter for fracture with routine healing: Secondary | ICD-10-CM | POA: Diagnosis not present

## 2019-10-31 DIAGNOSIS — G5761 Lesion of plantar nerve, right lower limb: Secondary | ICD-10-CM | POA: Diagnosis not present

## 2019-10-31 DIAGNOSIS — R609 Edema, unspecified: Secondary | ICD-10-CM | POA: Diagnosis not present

## 2019-10-31 DIAGNOSIS — M792 Neuralgia and neuritis, unspecified: Secondary | ICD-10-CM | POA: Diagnosis not present

## 2019-10-31 DIAGNOSIS — M79672 Pain in left foot: Secondary | ICD-10-CM | POA: Diagnosis not present

## 2019-10-31 NOTE — Telephone Encounter (Signed)
Follow up  Pt saw mychart result but still wanted to speak with nurse. She wanted to know if she can go back to driving  Please call

## 2019-10-31 NOTE — Telephone Encounter (Signed)
Patient viewed in [place]- is she cleared to drive?  Thanks!

## 2019-11-03 NOTE — Telephone Encounter (Signed)
If there have not been any recurrent episodes of syncope or near syncope, then she is okay to drive.

## 2019-11-04 NOTE — Telephone Encounter (Signed)
Advised patient, verbalized understanding Patient is concerned about why she has had the 2 episodes of syncope 5 months apart Per patient she did not do anything but sit around while wearing the monitor because of her foot She would like to know what Dr Oval Linsey thinks next step should be Will forward to Dr Oval Linsey for review

## 2019-11-06 NOTE — Telephone Encounter (Signed)
For longer term monitoring she would need a loop recorder. Can refer her to Dr. Loletha Grayer or EP if she is interested in that.

## 2019-11-08 NOTE — Telephone Encounter (Signed)
Advised patient and she would like to proceed with Dr C appointment  Message sent to scheduling to arrange

## 2019-11-14 DIAGNOSIS — G5762 Lesion of plantar nerve, left lower limb: Secondary | ICD-10-CM | POA: Diagnosis not present

## 2019-11-14 DIAGNOSIS — R609 Edema, unspecified: Secondary | ICD-10-CM | POA: Diagnosis not present

## 2019-11-14 DIAGNOSIS — M792 Neuralgia and neuritis, unspecified: Secondary | ICD-10-CM | POA: Diagnosis not present

## 2019-11-14 DIAGNOSIS — G5761 Lesion of plantar nerve, right lower limb: Secondary | ICD-10-CM | POA: Diagnosis not present

## 2019-11-15 DIAGNOSIS — H52203 Unspecified astigmatism, bilateral: Secondary | ICD-10-CM | POA: Diagnosis not present

## 2019-11-15 DIAGNOSIS — Z79899 Other long term (current) drug therapy: Secondary | ICD-10-CM | POA: Diagnosis not present

## 2019-11-15 DIAGNOSIS — H16121 Filamentary keratitis, right eye: Secondary | ICD-10-CM | POA: Diagnosis not present

## 2019-11-15 DIAGNOSIS — H40023 Open angle with borderline findings, high risk, bilateral: Secondary | ICD-10-CM | POA: Diagnosis not present

## 2019-11-19 DIAGNOSIS — I6523 Occlusion and stenosis of bilateral carotid arteries: Secondary | ICD-10-CM | POA: Diagnosis not present

## 2019-11-19 DIAGNOSIS — Z5181 Encounter for therapeutic drug level monitoring: Secondary | ICD-10-CM | POA: Diagnosis not present

## 2019-11-19 DIAGNOSIS — I10 Essential (primary) hypertension: Secondary | ICD-10-CM | POA: Diagnosis not present

## 2019-11-19 LAB — COMPREHENSIVE METABOLIC PANEL
ALT: 10 IU/L (ref 0–32)
AST: 15 IU/L (ref 0–40)
Albumin/Globulin Ratio: 1.7 (ref 1.2–2.2)
Albumin: 4 g/dL (ref 3.7–4.7)
Alkaline Phosphatase: 169 IU/L — ABNORMAL HIGH (ref 48–121)
BUN/Creatinine Ratio: 14 (ref 12–28)
BUN: 13 mg/dL (ref 8–27)
Bilirubin Total: 0.5 mg/dL (ref 0.0–1.2)
CO2: 25 mmol/L (ref 20–29)
Calcium: 9 mg/dL (ref 8.7–10.3)
Chloride: 105 mmol/L (ref 96–106)
Creatinine, Ser: 0.91 mg/dL (ref 0.57–1.00)
GFR calc Af Amer: 73 mL/min/{1.73_m2} (ref >59)
GFR calc non Af Amer: 64 mL/min/{1.73_m2} (ref >59)
Globulin, Total: 2.3 g/dL (ref 1.5–4.5)
Glucose: 100 mg/dL — ABNORMAL HIGH (ref 65–99)
Potassium: 4 mmol/L (ref 3.5–5.2)
Sodium: 143 mmol/L (ref 134–144)
Total Protein: 6.3 g/dL (ref 6.0–8.5)

## 2019-11-19 LAB — LIPID PANEL
Chol/HDL Ratio: 1.9 ratio (ref 0.0–4.4)
Cholesterol, Total: 156 mg/dL (ref 100–199)
HDL: 81 mg/dL (ref >39)
LDL Chol Calc (NIH): 63 mg/dL (ref 0–99)
Triglycerides: 56 mg/dL (ref 0–149)
VLDL Cholesterol Cal: 12 mg/dL (ref 5–40)

## 2019-11-28 ENCOUNTER — Other Ambulatory Visit: Payer: Self-pay | Admitting: Cardiovascular Disease

## 2019-11-28 MED ORDER — ROSUVASTATIN CALCIUM 20 MG PO TABS: 20.0000 mg | ORAL_TABLET | Freq: Every day | ORAL | 3 refills | Status: DC

## 2019-11-28 NOTE — Telephone Encounter (Signed)
*  STAT* If patient is at the pharmacy, call can be transferred to refill team.   1. Which medications need to be refilled? (please list name of each medication and dose if known)  rosuvastatin (CRESTOR) 20 MG tablet(Expired)  2. Which pharmacy/location (including street and city if local pharmacy) is medication to be sent to? [facility]   3. Do they need a 30 day or 90 day supply? 90 day supply   Patient only has 6 tablets left.

## 2019-11-29 DIAGNOSIS — H16121 Filamentary keratitis, right eye: Secondary | ICD-10-CM | POA: Diagnosis not present

## 2019-12-06 DIAGNOSIS — H16121 Filamentary keratitis, right eye: Secondary | ICD-10-CM | POA: Diagnosis not present

## 2019-12-10 ENCOUNTER — Encounter: Payer: Self-pay | Admitting: Cardiovascular Disease

## 2019-12-10 ENCOUNTER — Ambulatory Visit (INDEPENDENT_AMBULATORY_CARE_PROVIDER_SITE_OTHER): Payer: Medicare Other | Admitting: Cardiovascular Disease

## 2019-12-10 ENCOUNTER — Other Ambulatory Visit: Payer: Self-pay

## 2019-12-10 VITALS — BP 140/75 | HR 63 | Temp 96.8°F | Ht 65.0 in | Wt 195.0 lb

## 2019-12-10 DIAGNOSIS — R55 Syncope and collapse: Secondary | ICD-10-CM | POA: Diagnosis not present

## 2019-12-10 NOTE — Progress Notes (Signed)
Cardiology Office Note:    Date:  12/10/2019   ID:  [name], DOB 09-18-1950, MRN [id number]  PCP:  Merrilee Seashore, MD  [facility] Cardiologist:  Skeet Latch, MD  [facility] Electrophysiologist:  None   Referring MD: Merrilee Seashore, MD   Chief Complaint  Patient presents with   Loss of Consciousness    History of Present Illness:    [name] is a 69 y.o. female with a hx of at least 2 episodes of near syncope that have occurred roughly 5 months apart.  No significant arrhythmias were seen on a 2-week event monitor performed last July 2020 or on the 30-day event monitor performed in April 2021.  Work-up for structural heart disease has included an echocardiogram (did have signs of diastolic dysfunction) all and a nuclear stress test with normal findings.  Mild nonobstructive carotid plaque was seen by ultrasound.  The patient specifically denies any chest pain at rest exertion, dyspnea at rest or with exertion, orthopnea, paroxysmal nocturnal dyspnea, focal neurological deficits, intermittent claudication, lower extremity edema, unexplained weight gain, cough, hemoptysis or wheezing.  When she wore the monitor she had some palpitations that seem to correlate with PACs.  Very brief nonsustained atrial tachycardia was also seen.  Her episodes of near syncope have always occurred while standing, sometimes they are related to a change in the position of her head, when looking upwards  Significant chronic medical problems include a remote history of stroke, rheumatoid arthritis/lupus with interstitial lung disease, essential hypertension, history of lymphoplasmacytoid lymphoma and asthma.  She is here today to discuss implantable loop recorder at the recommendation of Dr. Oval Linsey.   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   Near syncope 08/30/2019   PONV (postoperative nausea and vomiting)    Stroke ([facility])    lt side weakness    Past Surgical History:  Procedure Laterality Date   ABDOMINAL HYSTERECTOMY     BACK SURGERY     CHOLECYSTECTOMY     COLONOSCOPY     FOOT SURGERY     x2   MASS EXCISION     back   rortator     rotator cuff   TOTAL SHOULDER ARTHROPLASTY Left 08/16/2018   Procedure: LEFT SHOULDER REVERSE TOTAL SHOULDER ARTHROPLASTY AND BICEPS TENOTOMY;  Surgeon: Tania Ade, MD;  Location: [place];  Service: Orthopedics;  Laterality: Left;    Current Medications: Current Meds  Medication Sig   acetaminophen (TYLENOL) 500 MG tablet Take 1,000 mg by mouth every 6 (six) hours as needed for mild pain or headache.   aspirin 81 MG chewable tablet Chew by mouth daily.   Biotin 1000 MCG tablet Take 1,000 mcg by mouth daily.   cholecalciferol (VITAMIN D) 1000 UNITS tablet Take 1,000 Units by mouth daily.    Flaxseed, Linseed, (FLAX SEED OIL PO) Take 1 capsule by mouth daily.   folic acid (FOLVITE) 1 MG tablet Take 1 tablet by mouth daily.   furosemide (LASIX) 80 MG tablet Take 80 mg by mouth daily. OK TO TAKE AN EXTRA 1/2 AS NEEDED FOR SWELLING/WT GAIN   hydroxychloroquine (PLAQUENIL) 200 MG tablet Take 400 mg by mouth daily.    latanoprost (XALATAN) 0.005 % ophthalmic solution Place 1 drop into both eyes at bedtime.  losartan (COZAAR) 100 MG tablet Take 100 mg by mouth daily.   potassium chloride SA (K-DUR,KLOR-CON) 20 MEQ tablet Take 2 tablets (40 mEq total) by mouth 2 (two) times daily.   rosuvastatin (CRESTOR) 20 MG tablet Take 1 tablet (20 mg total) by mouth daily.   Turmeric (QC TUMERIC COMPLEX) 500 MG CAPS Take by mouth.     Allergies:   Iodinated diagnostic agents, Ciprofloxacin, E-mycin [erythromycin base], and Oxycodone   Social History   Socioeconomic History   Marital status: Married     Spouse name: Not on file   Number of children: Not on file   Years of education: Not on file   Highest education level: Not on file  Occupational History   Not on file  Tobacco Use   Smoking status: Former Smoker    Packs/day: 1.00    Years: 10.00    Pack years: 10.00    Quit date: 06/17/1994    Years since quitting: 25.4   Smokeless tobacco: Never Used  Substance and Sexual Activity   Alcohol use: Not Currently    Alcohol/week: 0.0 standard drinks   Drug use: No   Sexual activity: Not Currently  Other Topics Concern   Not on file  Social History Narrative   Not on file   Social Determinants of Health   Financial Resource Strain:    Difficulty of Paying Living Expenses:   Food Insecurity:    Worried About Charity fundraiser in the Last Year:    Arboriculturist in the Last Year:   Transportation Needs:    Film/video editor (Medical):    Lack of Transportation (Non-Medical):   Physical Activity:    Days of Exercise per Week:    Minutes of Exercise per Session:   Stress:    Feeling of Stress :   Social Connections:    Frequency of Communication with Friends and Family:    Frequency of Social Gatherings with Friends and Family:    Attends Religious Services:    Active Member of Clubs or Organizations:    Attends Archivist Meetings:    Marital Status:      Family History: The patient's family history includes Coronary artery disease in her brother; Lung disease in her father.  ROS:   Please see the history of present illness.     All other systems reviewed and are negative.  EKGs/Labs/Other Studies Reviewed:    The following studies were reviewed today: Echocardiogram, nuclear stress test, 2 separate long term event monitors.  EKG:  EKG is ordered today.  The ekg ordered today demonstrates sinus rhythm, borderline criteria for LVH with mild downsloping ST segment depression T wave inversion seen in the inferior leads as  well as V5-V6  Recent Labs: 03/19/2019: Hemoglobin 12.6; Platelets 161 03/28/2019: TSH 2.540 11/19/2019: ALT 10; BUN 13; Creatinine, Ser 0.91; Potassium 4.0; Sodium 143  Recent Lipid Panel    Component Value Date/Time   CHOL 156 11/19/2019 1032   TRIG 56 11/19/2019 1032   HDL 81 11/19/2019 1032   CHOLHDL 1.9 11/19/2019 1032   LDLCALC 63 11/19/2019 1032    Physical Exam:    VS:  BP 140/75    Pulse 63    Temp (!) 96.8 F (36 C)    Ht 5\' 5"  (1.651 m)    Wt 195 lb (88.5 kg)    SpO2 97%    BMI 32.45 kg/m     Wt Readings from Last 3  Encounters:  12/10/19 195 lb (88.5 kg)  08/13/19 198 lb 3.2 oz (89.9 kg)  06/21/19 196 lb 12.8 oz (89.3 kg)     GEN: Mildly obese well nourished, well developed in no acute distress HEENT: Normal NECK: No JVD; No carotid bruits LYMPHATICS: No lymphadenopathy CARDIAC: RRR, no murmurs, rubs, gallops RESPIRATORY:  Clear to auscultation without rales, wheezing or rhonchi  ABDOMEN: Soft, non-tender, non-distended MUSCULOSKELETAL:  No edema; No deformity  SKIN: Warm and dry NEUROLOGIC:  Alert and oriented x 3 PSYCHIATRIC:  Normal affect   ASSESSMENT:    1. Near syncope    PLAN:    In order of problems listed above:  1. [name] has had repeated episodes of near syncope that occur at long intervals (roughly 5 months apart) and have been rather unpredictable.  Event monitoring on 2 separate occasions has not yielded a diagnosis.  Recommended an implantable loop recorder. This procedure has been fully reviewed with the patient and written informed consent has been obtained.  Medication Adjustments/Labs and Tests Ordered: Current medicines are reviewed at length with the patient today.  Concerns regarding medicines are outlined above.  Orders Placed This Encounter  Procedures   EKG 12-Lead   No orders of the defined types were placed in this encounter.   Patient Instructions     [place] [facility]  [address] [place]  [place], [place] [address]  Phone: [phone] Fax: [phone]   You are scheduled for a Loop Recorder Implant on 16/14/21 at 11:40 AM with Dr. Sallyanne Kuster. This will take place at [place], [address].    Please arrive at your appointment 15-20 minutes early.    You do not need to be fasting.    The procedure is performed with local anesthesia. You will not receive sedatives nor will an IV be placed.    Wash your chest and neck with the surgical soap the evening before and the morning of your procedure. Please following the washing instructions provided.    As with all surgical implants, there is a small risk of infection. If an infection occurs, the device will be removed. To help reduce the risk, please use the surgical scrub provided. Additional antiseptic precautions will be taken at the time of the procedure.    Please bring your insurance cards.   *Please note that scheduled loop recorder implants may need to be rescheduled if Dr. Sallyanne Kuster has a procedure urgently added on at the hospital   Preparing for the Procedure  Before the procedure, you can play an important role. Because skin is not sterile, your skin needs to be as free of germs as possible. You can reduce the number of germs on your skin by washing with CHG (chlorhexidine gluconate) Soap before the procedure. CHG is an antiseptic cleaner which kills germs and bonds with the skin to continue killing germs even after washing.  Please do not use if you have an allergy to CHG or antibacterial soaps. If your skin becomes reddened/irritated, STOP using the CHG.  DO NOT SHAVE (including legs and underarms) for at least 48 hours prior to first CHG shower. It is OK to shave your face.  Please follow these instructions carefully: 1. Shower the night before the procedure and the morning of with CHG Soap. 2. If you chose to wash your hair, wash your hair first as usual with your normal  shampoo/conditioner. 3. After you shampoo/condition, rinse you hair and body thoroughly to  remove shampoo/conditioner. 4. Use CHG as you would any other liquid soap. You can apply CHG directly to the skin and wash gently with a loofah or a clean washcloth. 5. Apply the CHG Soap to your body ONLY FROM THE NECK DOWN. Do not use on open wounds or open sores. Avoid contact with your eyes, ears, mouth, and genitals (private parts).  6. Wash thoroughly, paying special attention to the area where your surgery will be performed. 7. Thoroughly rinse your body with warm water from the neck down. 8. DO NOT shower/wash with your normal soap after using and rinsing off the CHG Soap. 9. Pat yourself dry with a clean towel. 10. Wear clean pajamas to bed. 11. Place clean sheets on your bed the night of your first shower and do not sleep with pets..  Day of Surgery: Shower with the CHG Soap following the instructions listed above. DO NOT apply deodorants or lotions.       Signed, Sanda Klein, MD  12/10/2019 12:38 PM    [place]

## 2019-12-10 NOTE — Patient Instructions (Addendum)
    [facility], [place]  [place], [place] [address]  Phone: [phone] Fax: [phone]   You are scheduled for a Loop Recorder Implant on 16/14/21 at 11:40 AM with Dr. Sallyanne Kuster. This will take place at [address], [address].    Please arrive at your appointment 15-20 minutes early.    You do not need to be fasting.    The procedure is performed with local anesthesia. You will not receive sedatives nor will an IV be placed.    Wash your chest and neck with the surgical soap the evening before and the morning of your procedure. Please following the washing instructions provided.    As with all surgical implants, there is a small risk of infection. If an infection occurs, the device will be removed. To help reduce the risk, please use the surgical scrub provided. Additional antiseptic precautions will be taken at the time of the procedure.    Please bring your insurance cards.   *Please note that scheduled loop recorder implants may need to be rescheduled if Dr. Sallyanne Kuster has a procedure urgently added on at the hospital   Preparing for the Procedure  Before the procedure, you can play an important role. Because skin is not sterile, your skin needs to be as free of germs as possible. You can reduce the number of germs on your skin by washing with CHG (chlorhexidine gluconate) Soap before the procedure. CHG is an antiseptic cleaner which kills germs and bonds with the skin to continue killing germs even after washing.  Please do not use if you have an allergy to CHG or antibacterial soaps. If your skin becomes reddened/irritated, STOP using the CHG.  DO NOT SHAVE (including legs and underarms) for at least 48 hours prior to first CHG shower. It is OK to shave your face.  Please follow these instructions carefully: 1. Shower the night before the procedure and the morning of with CHG Soap. 2. If you chose to wash your  hair, wash your hair first as usual with your normal shampoo/conditioner. 3. After you shampoo/condition, rinse you hair and body thoroughly to remove shampoo/conditioner. 4. Use CHG as you would any other liquid soap. You can apply CHG directly to the skin and wash gently with a loofah or a clean washcloth. 5. Apply the CHG Soap to your body ONLY FROM THE NECK DOWN. Do not use on open wounds or open sores. Avoid contact with your eyes, ears, mouth, and genitals (private parts).  6. Wash thoroughly, paying special attention to the area where your surgery will be performed. 7. Thoroughly rinse your body with warm water from the neck down. 8. DO NOT shower/wash with your normal soap after using and rinsing off the CHG Soap. 9. Pat yourself dry with a clean towel. 10. Wear clean pajamas to bed. 11. Place clean sheets on your bed the night of your first shower and do not sleep with pets..  Day of Surgery: Shower with the CHG Soap following the instructions listed above. DO NOT apply deodorants or lotions.

## 2019-12-12 DIAGNOSIS — M25569 Pain in unspecified knee: Secondary | ICD-10-CM | POA: Diagnosis not present

## 2019-12-12 DIAGNOSIS — M150 Primary generalized (osteo)arthritis: Secondary | ICD-10-CM | POA: Diagnosis not present

## 2019-12-12 DIAGNOSIS — M3501 Sicca syndrome with keratoconjunctivitis: Secondary | ICD-10-CM | POA: Diagnosis not present

## 2019-12-12 DIAGNOSIS — M064 Inflammatory polyarthropathy: Secondary | ICD-10-CM | POA: Diagnosis not present

## 2019-12-12 DIAGNOSIS — Z79899 Other long term (current) drug therapy: Secondary | ICD-10-CM | POA: Diagnosis not present

## 2019-12-16 ENCOUNTER — Ambulatory Visit (INDEPENDENT_AMBULATORY_CARE_PROVIDER_SITE_OTHER): Payer: Medicare Other | Admitting: Cardiovascular Disease

## 2019-12-16 ENCOUNTER — Other Ambulatory Visit: Payer: Self-pay

## 2019-12-16 DIAGNOSIS — R55 Syncope and collapse: Secondary | ICD-10-CM | POA: Diagnosis not present

## 2019-12-16 DIAGNOSIS — Z95818 Presence of other cardiac implants and grafts: Secondary | ICD-10-CM | POA: Diagnosis not present

## 2019-12-16 MED ORDER — LIDOCAINE-EPINEPHRINE 1 %-1:100000 IJ SOLN: 10.0000 mL | Freq: Once | INTRAMUSCULAR | Status: DC

## 2019-12-16 NOTE — Progress Notes (Signed)
LOOP RECORDER IMPLANT   Procedure report  Procedure performed:  Loop recorder implantation   Reason for procedure:  Recurrent syncope/near-syncope  Procedure performed by:  Sanda Klein, MD  Complications:  None  Estimated blood loss:  <5 mL  Medications administered during procedure:  Lidocaine 1% with 1/100,000 epinephrine 10 mL locally Device details:  Medtronic Reveal Linq model number [id number], serial number [id number] Procedure details:  After the risks and benefits of the procedure were discussed the patient provided informed consent. The patient was prepped and draped in usual sterile fashion. Local anesthesia was administered to an area 2 cm to the left of the sternum in the 4th intercostal space. A cutaneous incision was made using the incision tool. The introducer was then used to create a subcutaneous tunnel and carefully deploy the device. Local pressure was held to ensure hemostasis.  The incision was closed with SteriStrips and a sterile dressing was applied.   Sanda Klein, MD, [facility] [address] [facility] [phone] office [phone] pager 12/16/2019 12:35 PM

## 2019-12-16 NOTE — Patient Instructions (Signed)
Discharge Instructions for  Loop Recorder Implant    Follow up: You will need a wound check appointment in the next 10-14 days. They will call you to set this up.   If you have any questions or concerns, please call the office at [phone].  ACTIVITY No restrictions. DO wear your seatbelt, even if it crosses over the site.   WOUND CARE  Keep the wound area clean and dry.  Remove the dressing the day after (usually 24 hours after the procedure).  DO NOT SUBMERGE UNDER WATER UNTIL FULLY HEALED (no tub baths, hot tubs, swimming pools, etc.).   You  may shower or take a sponge bath after the dressing is removed. DO NOT SOAK the area and do not allow the shower to directly spray on the site.  If you have tape/steri-strips on your wound, these will fall off; do not pull them off prematurely.    No bandage is needed on the site.  DO  NOT apply any creams, oils, or ointments to the wound area.  If you notice any drainage or discharge from the wound, any swelling, excessive redness or bruising at the site, or if you develop a fever > 101? F, call the office at once at [phone].

## 2019-12-19 DIAGNOSIS — R609 Edema, unspecified: Secondary | ICD-10-CM | POA: Diagnosis not present

## 2019-12-19 DIAGNOSIS — M792 Neuralgia and neuritis, unspecified: Secondary | ICD-10-CM | POA: Diagnosis not present

## 2019-12-19 DIAGNOSIS — M79672 Pain in left foot: Secondary | ICD-10-CM | POA: Diagnosis not present

## 2019-12-19 DIAGNOSIS — M21962 Unspecified acquired deformity of left lower leg: Secondary | ICD-10-CM | POA: Diagnosis not present

## 2019-12-19 DIAGNOSIS — L603 Nail dystrophy: Secondary | ICD-10-CM | POA: Diagnosis not present

## 2019-12-20 DIAGNOSIS — H16121 Filamentary keratitis, right eye: Secondary | ICD-10-CM | POA: Diagnosis not present

## 2019-12-20 DIAGNOSIS — H04123 Dry eye syndrome of bilateral lacrimal glands: Secondary | ICD-10-CM | POA: Diagnosis not present

## 2019-12-25 DIAGNOSIS — L603 Nail dystrophy: Secondary | ICD-10-CM | POA: Diagnosis not present

## 2019-12-26 ENCOUNTER — Ambulatory Visit (INDEPENDENT_AMBULATORY_CARE_PROVIDER_SITE_OTHER): Payer: Medicare Other | Admitting: Emergency Medicine

## 2019-12-26 ENCOUNTER — Other Ambulatory Visit: Payer: Self-pay

## 2019-12-26 DIAGNOSIS — R55 Syncope and collapse: Secondary | ICD-10-CM

## 2020-01-08 LAB — CUP PACEART INCLINIC DEVICE CHECK
Date Time Interrogation Session: 20210624104900
Implantable Pulse Generator Implant Date: 20210614

## 2020-01-08 NOTE — Progress Notes (Signed)
Loop check in clinic. Battery status: OK. R-waves 0.45 mV. 0 symptom episodes, 0 tachy episodes, 0 pause episodes, 0  brady episodes. 0 AF episodes (0% burden). Monthly summary reports and ROV with Dr. Recardo Evangelist prn.

## 2020-01-10 DIAGNOSIS — G5761 Lesion of plantar nerve, right lower limb: Secondary | ICD-10-CM | POA: Diagnosis not present

## 2020-01-10 DIAGNOSIS — M7741 Metatarsalgia, right foot: Secondary | ICD-10-CM | POA: Diagnosis not present

## 2020-01-10 DIAGNOSIS — M7742 Metatarsalgia, left foot: Secondary | ICD-10-CM | POA: Diagnosis not present

## 2020-01-10 DIAGNOSIS — G5762 Lesion of plantar nerve, left lower limb: Secondary | ICD-10-CM | POA: Diagnosis not present

## 2020-01-17 ENCOUNTER — Ambulatory Visit (INDEPENDENT_AMBULATORY_CARE_PROVIDER_SITE_OTHER): Payer: Medicare Other | Admitting: *Deleted

## 2020-01-17 DIAGNOSIS — R55 Syncope and collapse: Secondary | ICD-10-CM | POA: Diagnosis not present

## 2020-01-18 LAB — CUP PACEART REMOTE DEVICE CHECK
Date Time Interrogation Session: 20210717113023
Implantable Pulse Generator Implant Date: 20210614

## 2020-01-21 NOTE — Patient Instructions (Signed)
n

## 2020-01-21 NOTE — Progress Notes (Signed)
Carelink Summary Report / Loop Recorder 

## 2020-01-28 ENCOUNTER — Telehealth: Payer: Self-pay | Admitting: Cardiovascular Disease

## 2020-01-28 DIAGNOSIS — I10 Essential (primary) hypertension: Secondary | ICD-10-CM

## 2020-01-28 DIAGNOSIS — Z5181 Encounter for therapeutic drug level monitoring: Secondary | ICD-10-CM

## 2020-01-28 NOTE — Telephone Encounter (Signed)
Pt c/o medication issue:  1. Name of Medication: furosemide (LASIX) 80 MG tablet  2. How are you currently taking this medication (dosage and times per day)? 80 mg daily, additional 1/2 tablet as needed   3. Are you having a reaction (difficulty breathing--STAT)? no  4. What is your medication issue? Patient wants to know how often she should take the extra 1/2 tablet and avoid damage to her kidneys. Please advise

## 2020-01-28 NOTE — Telephone Encounter (Signed)
Per pt has been taking the extra 1/2 tab of Lasix about 3x a week Pt is wanting to know when or if labs are needed to check kidneys Will forward to Dr Oval Linsey for review . Pt has a follow up appt  in October /cy

## 2020-01-28 NOTE — Telephone Encounter (Signed)
OK to come for a BMP.

## 2020-01-29 NOTE — Telephone Encounter (Signed)
Spoke with patient and she will get labs tomorrow or Friday

## 2020-01-30 DIAGNOSIS — I10 Essential (primary) hypertension: Secondary | ICD-10-CM | POA: Diagnosis not present

## 2020-01-30 DIAGNOSIS — Z5181 Encounter for therapeutic drug level monitoring: Secondary | ICD-10-CM | POA: Diagnosis not present

## 2020-01-31 LAB — BASIC METABOLIC PANEL
BUN/Creatinine Ratio: 19 (ref 12–28)
BUN: 17 mg/dL (ref 8–27)
CO2: 23 mmol/L (ref 20–29)
Calcium: 8.7 mg/dL (ref 8.7–10.3)
Chloride: 104 mmol/L (ref 96–106)
Creatinine, Ser: 0.89 mg/dL (ref 0.57–1.00)
GFR calc Af Amer: 75 mL/min/{1.73_m2} (ref >59)
GFR calc non Af Amer: 65 mL/min/{1.73_m2} (ref >59)
Glucose: 91 mg/dL (ref 65–99)
Potassium: 4.2 mmol/L (ref 3.5–5.2)
Sodium: 141 mmol/L (ref 134–144)

## 2020-02-17 ENCOUNTER — Ambulatory Visit (INDEPENDENT_AMBULATORY_CARE_PROVIDER_SITE_OTHER): Payer: Medicare Other | Admitting: *Deleted

## 2020-02-17 DIAGNOSIS — R55 Syncope and collapse: Secondary | ICD-10-CM | POA: Diagnosis not present

## 2020-02-20 ENCOUNTER — Other Ambulatory Visit: Payer: Self-pay | Admitting: Cardiovascular Disease

## 2020-02-20 ENCOUNTER — Telehealth: Payer: Self-pay

## 2020-02-20 ENCOUNTER — Telehealth: Payer: Self-pay | Admitting: Cardiovascular Disease

## 2020-02-20 ENCOUNTER — Other Ambulatory Visit: Payer: Self-pay

## 2020-02-20 LAB — CUP PACEART REMOTE DEVICE CHECK
Date Time Interrogation Session: 20210819113415
Implantable Pulse Generator Implant Date: 20210614

## 2020-02-20 MED ORDER — ROSUVASTATIN CALCIUM 20 MG PO TABS: 20.0000 mg | ORAL_TABLET | Freq: Every day | ORAL | 3 refills | Status: DC

## 2020-02-20 NOTE — Telephone Encounter (Signed)
Crestor 90 day refill sent to [facility].

## 2020-02-20 NOTE — Telephone Encounter (Signed)
I let the pt know her monitor sends automatically between the hours of 12 am-5am . She do not need to send a transmission unless the nurse asks her too.

## 2020-02-20 NOTE — Telephone Encounter (Signed)
New Message    *STAT* If patient is at the pharmacy, call can be transferred to refill team.   1. Which medications need to be refilled? (please list name of each medication and dose if known) rosuvastatin (CRESTOR) 20 MG tablet    2. Which pharmacy/location (including street and city if local pharmacy) is medication to be sent to? [facility]  3. Do they need a 30 day or 90 day supply? 90   Patient needs a new rx

## 2020-02-20 NOTE — Telephone Encounter (Signed)
New Message   Patient is calling because she spoke with someone back on 7/27(see phone note) about her retaining fluid and wanted to know if she can take an additional 40mg  of lasix periodically. She states that she was advised that she needed to have labs drawn first and then she would be advised. At this time patient has not heard anything. Please call to discuss.

## 2020-02-20 NOTE — Telephone Encounter (Signed)
Spoke to patient she wanted to ask Dr.[place] if ok to take a extra 1/2 Lasix daily if needed.Message sent to [place] for advice.

## 2020-02-21 NOTE — Telephone Encounter (Signed)
Spoke to patient advised Dr.Bicknell agreed take Lasix 80 mg daily may take a extra 1/2 tablet daily if needed for swelling/wt gain.

## 2020-02-21 NOTE — Telephone Encounter (Signed)
That is fine 

## 2020-02-25 NOTE — Progress Notes (Signed)
Carelink Summary Report / Loop Recorder 

## 2020-02-26 DIAGNOSIS — R1030 Lower abdominal pain, unspecified: Secondary | ICD-10-CM | POA: Diagnosis not present

## 2020-02-26 DIAGNOSIS — K5792 Diverticulitis of intestine, part unspecified, without perforation or abscess without bleeding: Secondary | ICD-10-CM | POA: Diagnosis not present

## 2020-03-18 LAB — CUP PACEART REMOTE DEVICE CHECK
Date Time Interrogation Session: 20210914093642
Implantable Pulse Generator Implant Date: 20210614

## 2020-03-19 ENCOUNTER — Ambulatory Visit (INDEPENDENT_AMBULATORY_CARE_PROVIDER_SITE_OTHER): Payer: Medicare Other | Admitting: *Deleted

## 2020-03-19 DIAGNOSIS — R55 Syncope and collapse: Secondary | ICD-10-CM

## 2020-03-23 NOTE — Progress Notes (Signed)
Carelink Summary Report / Loop Recorder 

## 2020-03-24 LAB — CUP PACEART REMOTE DEVICE CHECK
Date Time Interrogation Session: 20210921113440
Implantable Pulse Generator Implant Date: 20210614

## 2020-04-03 DIAGNOSIS — I10 Essential (primary) hypertension: Secondary | ICD-10-CM | POA: Diagnosis not present

## 2020-04-03 DIAGNOSIS — I5032 Chronic diastolic (congestive) heart failure: Secondary | ICD-10-CM | POA: Diagnosis not present

## 2020-04-03 DIAGNOSIS — D696 Thrombocytopenia, unspecified: Secondary | ICD-10-CM | POA: Diagnosis not present

## 2020-04-08 ENCOUNTER — Other Ambulatory Visit: Payer: Self-pay

## 2020-04-08 ENCOUNTER — Encounter: Payer: Self-pay | Admitting: Cardiovascular Disease

## 2020-04-08 ENCOUNTER — Ambulatory Visit: Payer: Medicare Other | Admitting: Cardiovascular Disease

## 2020-04-08 VITALS — BP 110/78 | HR 70 | Ht 66.0 in | Wt 199.0 lb

## 2020-04-08 DIAGNOSIS — I5032 Chronic diastolic (congestive) heart failure: Secondary | ICD-10-CM

## 2020-04-08 DIAGNOSIS — I10 Essential (primary) hypertension: Secondary | ICD-10-CM

## 2020-04-08 DIAGNOSIS — R55 Syncope and collapse: Secondary | ICD-10-CM | POA: Diagnosis not present

## 2020-04-08 DIAGNOSIS — R079 Chest pain, unspecified: Secondary | ICD-10-CM

## 2020-04-08 NOTE — Progress Notes (Signed)
Cardiology Office Note   Date:  04/08/2020   ID:  [name], DOB 03-26-1951, MRN [id number]  PCP:  Merrilee Seashore, MD  Cardiologist:   Skeet Latch, MD   No chief complaint on file.    History of Present Illness: [name] is a 69 y.o. female with prior stroke, rheumatoid arthritis, lupus, interstitial lung disease, hypertension, prior lymphoplasmacytoid lymphoma, and asthma here for follow up.  She was initially seen in the ED 04/2018 with chest pain.  She reported L shoulder and chest wall pain.  EKG revealed diffuse T wave inversions.  Cardiac enzymes were negative x3.  She was referred for outpatient stress testing and had a Lexiscan Myoview 05/07/2018 that revealed LVEF 68%.  There was no ischemia.  She previously had an echo 01/2018 that revealed LVEF 60 to 65% with grade 2 diastolic dysfunction.    [name] had an episode of near syncope 03/2019.  Since then metoprolol was stopped.  Since her last appointment she saw Dr. Loletha Grayer in and had an implantable loop monitor placed 12/2019.  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  She denies any recurrent syncope since that time.  She has notes some swelling in her R clavicle.  She still has pain in her L foot since she fractured it.  She wears a compression sock or else it will swell.  She notes that she is short of breath when she walks.  She was going to the gym 3 days per week until there was a COVID-19 outbreak.  She is afraid to walk by herself in case she passes out or falls.  She has no exertional chest pain.  She makes sure to walk at least a mile by the end of the day. She notes that her appetite has been poor.  She feels like she will gag if she tries to eat. She had an episode of diverticulitis last week and was on a liquid diet.  This has resolved.   Past Medical History:  Diagnosis Date  . Acid reflux   . Arthritis   . Asthma   . Chronic diastolic heart failure ([place]) 08/30/2019  . Hypertension   .  Lupus ([place]) 07/13/2011  . 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 Near syncope 08/30/2019  . PONV (postoperative nausea and vomiting)   . Stroke [facility])    lt side weakness    Past Surgical History:  Procedure Laterality Date  . ABDOMINAL HYSTERECTOMY    . BACK SURGERY    . CHOLECYSTECTOMY    . COLONOSCOPY    . FOOT SURGERY     x2  . MASS EXCISION     back  . rortator     rotator cuff  . TOTAL SHOULDER ARTHROPLASTY Left 08/16/2018   Procedure: LEFT SHOULDER REVERSE TOTAL SHOULDER ARTHROPLASTY AND BICEPS TENOTOMY;  Surgeon: Tania Ade, MD;  Location: [facility];  Service: Orthopedics;  Laterality: Left;     Current Outpatient Medications  Medication Sig Dispense Refill  . acetaminophen (TYLENOL) 500 MG tablet Take 1,000 mg by mouth every 6 (six) hours as needed for mild pain or headache.    Marland Kitchen aspirin 81 MG chewable tablet Chew by mouth daily.    . Biotin 1000 MCG tablet Take 1,000 mcg by mouth daily.    . cholecalciferol (VITAMIN D) 1000 UNITS tablet Take 1,000 Units by mouth daily.     . fenoprofen (NALFON) 600 MG TABS tablet Take 600 mg by mouth.    Marland Kitchen  folic acid (FOLVITE) 1 MG tablet Take 1 tablet by mouth daily.    . furosemide (LASIX) 80 MG tablet Take 80 mg daily  May take a extra 1/2 tablet daily if needed for swelling/wt gain 90 tablet 3  . hydroxychloroquine (PLAQUENIL) 200 MG tablet Take 400 mg by mouth daily.     Marland Kitchen latanoprost (XALATAN) 0.005 % ophthalmic solution Place 1 drop into both eyes at bedtime.    Marland Kitchen losartan (COZAAR) 100 MG tablet Take 100 mg by mouth daily.    Marland Kitchen omega-3 acid ethyl esters (LOVAZA) 1 g capsule Take by mouth 2 (two) times daily.    . pantoprazole (PROTONIX) 40 MG tablet Take 40 mg by mouth daily.    . potassium chloride SA (K-DUR,KLOR-CON) 20 MEQ tablet Take 2 tablets (40 mEq total) by mouth 2 (two) times daily.    . rosuvastatin (CRESTOR) 20 MG tablet Take 1 tablet (20 mg total) by mouth daily. 90 tablet 3  . Turmeric (QC TUMERIC  COMPLEX) 500 MG CAPS Take by mouth.    . vitamin B-12 (CYANOCOBALAMIN) 1000 MCG tablet Take 1,000 mcg by mouth daily.    Marland Kitchen VITAMIN E PO Take by mouth.     Current Facility-Administered Medications  Medication Dose Route Frequency Provider Last Rate Last Admin  . lidocaine-EPINEPHrine (XYLOCAINE W/EPI) 1 %-1:100000 (with pres) injection 10 mL  10 mL Infiltration Once Croitoru, Mihai, MD        Allergies:   Iodinated diagnostic agents, Ciprofloxacin, E-mycin [erythromycin base], and Oxycodone    Social History:  The patient  reports that she quit smoking about 25 years ago. She has a 10.00 pack-year smoking history. She has never used smokeless tobacco. She reports previous alcohol use. She reports that she does not use drugs.   Family History:  The patient's family history includes Coronary artery disease in her brother; Lung disease in her father.    ROS:  Please see the history of present illness.   Otherwise, review of systems are positive for none.   All other systems are reviewed and negative.    PHYSICAL EXAM: VS:  BP 110/78   Pulse 70   Ht 5\' 6"  (1.676 m)   Wt 199 lb (90.3 kg)   SpO2 99%   BMI 32.12 kg/m  , BMI Body mass index is 32.12 kg/m. GENERAL:  Well appearing HEENT: Pupils equal round and reactive, fundi not visualized, oral mucosa unremarkable NECK:  No jugular venous distention, waveform within normal limits, carotid upstroke brisk and symmetric, no bruits.  R clavicular head enlarged LUNGS:  Clear to auscultation bilaterally HEART:  RRR.  PMI not displaced or sustained,S1 and S2 within normal limits, no S3, no S4, no clicks, no rubs, no murmurs ABD:  Flat, positive bowel sounds normal in frequency in pitch, no bruits, no rebound, no guarding, no midline pulsatile mass, no hepatomegaly, no splenomegaly EXT:  2 plus pulses throughout, no edema, no cyanosis no clubbing SKIN:  No rashes no nodules NEURO:  Cranial nerves II through XII grossly intact, motor grossly  intact throughout PSYCH:  Cognitively intact, oriented to person place and time   EKG:  EKG is not ordered today.   Lexiscan Myoview 05/07/18:  Nuclear stress EF: 68%.  There was no ST segment deviation noted during stress.  No T wave inversion was noted during stress.  The study is normal.  This is a low risk study.  The left ventricular ejection fraction is hyperdynamic (>65%).   Echo 01/29/18:  Study Conclusions  - Left ventricle: The cavity size was normal. Wall thickness was   normal. Systolic function was normal. The estimated ejection   fraction was in the range of 60% to 65%. Wall motion was normal;   there were no regional wall motion abnormalities. Features are   consistent with a pseudonormal left ventricular filling pattern,   with concomitant abnormal relaxation and increased filling   pressure (grade 2 diastolic dysfunction). - Mitral valve: Calcified annulus. There was mild regurgitation. - Pericardium, extracardiac: A trivial pericardial effusion was   identified.  Impressions:  - Normal LV systolic function; moderate diastolic dysfunction;   mild MR.  Recent Labs: 11/19/2019: ALT 10 01/30/2020: BUN 17; Creatinine, Ser 0.89; Potassium 4.2; Sodium 141    Lipid Panel    Component Value Date/Time   CHOL 156 11/19/2019 1032   TRIG 56 11/19/2019 1032   HDL 81 11/19/2019 1032   CHOLHDL 1.9 11/19/2019 1032   LDLCALC 63 11/19/2019 1032     09/05/17: Total cholesterol 212, triglycerides 50, HDL 76, LDL 126    Wt Readings from Last 3 Encounters:  04/08/20 199 lb (90.3 kg)  12/10/19 195 lb (88.5 kg)  08/13/19 198 lb 3.2 oz (89.9 kg)      ASSESSMENT AND PLAN:  # Near syncope: Occurs when reaching overhead or looking up.  Carotid Dopplers revealed only mild stenosis and she has no orthostasis.  She has not had any recurrent events.  Implantable loop monitor is in place.  She follows with Dr. Loletha Grayer for this.  # Chest pain: Resolved.  Likely  musculoskeletal.  Lexiscan Myoview was negative for ischemia 04/2018.  # Shortness of breath: # Chronic diastolic heart failure: [name] is euvolemic.  She continues to limit her fluid intake to 48 ounces.  Renal function has been stable.  She takes Lasix with an extra 40 mg as needed.  She rarely needs to do so.  # Hypertension:  BP well-controlled.  Continue metoprolol, losartan and lasix.  She was encouraged to start back exercising 150 minutes weekly.   Current medicines are reviewed at length with the patient today.  The patient does not have concerns regarding medicines.  The following changes have been made:  no change  Labs/ tests ordered today include:   No orders of the defined types were placed in this encounter.    Disposition:   FU with Erica Mesta C. Oval Linsey, MD, [facility] in 6 months     Signed, Erica Baldini C. Oval Linsey, MD, [facility]  04/08/2020 10:19 AM    [place]

## 2020-04-08 NOTE — Patient Instructions (Signed)
Medication Instructions:  Your physician recommends that you continue on your current medications as directed. Please refer to the Current Medication list given to you today.  *If you need a refill on your cardiac medications before your next appointment, please call your pharmacy*  Lab Work: NONE   Testing/Procedures: NONE   Follow-Up: At [facility], you and your health needs are our priority.  As part of our continuing mission to provide you with exceptional heart care, we have created designated Provider Care Teams.  These Care Teams include your primary Cardiologist (physician) and Advanced Practice Providers (APPs -  Physician Assistants and Nurse Practitioners) who all work together to provide you with the care you need, when you need it.  We recommend signing up for the patient portal called "MyChart".  Sign up information is provided on this After Visit Summary.  MyChart is used to connect with patients for Virtual Visits (Telemedicine).  Patients are able to view lab/test results, encounter notes, upcoming appointments, etc.  Non-urgent messages can be sent to your provider as well.   To learn more about what you can do with MyChart, go to [email or web address].com.    Your next appointment:   6 month(s)  You will receive a reminder letter in the mail two months in advance. If you don't receive a letter, please call our office to schedule the follow-up appointment.  The format for your next appointment:   In Person  Provider:   You may see Tiffany [place], MD or one of the following Advanced Practice Providers on your designated Care Team:    Luke Kilroy, PA-C  Callie Goodrich, PA-C  Jesse Cleaver, FNP     

## 2020-04-10 DIAGNOSIS — I10 Essential (primary) hypertension: Secondary | ICD-10-CM | POA: Diagnosis not present

## 2020-04-10 DIAGNOSIS — M3501 Sicca syndrome with keratoconjunctivitis: Secondary | ICD-10-CM | POA: Diagnosis not present

## 2020-04-10 DIAGNOSIS — E782 Mixed hyperlipidemia: Secondary | ICD-10-CM | POA: Diagnosis not present

## 2020-04-10 DIAGNOSIS — Z23 Encounter for immunization: Secondary | ICD-10-CM | POA: Diagnosis not present

## 2020-04-10 DIAGNOSIS — I5032 Chronic diastolic (congestive) heart failure: Secondary | ICD-10-CM | POA: Diagnosis not present

## 2020-04-10 DIAGNOSIS — D696 Thrombocytopenia, unspecified: Secondary | ICD-10-CM | POA: Diagnosis not present

## 2020-04-20 ENCOUNTER — Ambulatory Visit (INDEPENDENT_AMBULATORY_CARE_PROVIDER_SITE_OTHER): Payer: Medicare Other

## 2020-04-20 DIAGNOSIS — R55 Syncope and collapse: Secondary | ICD-10-CM | POA: Diagnosis not present

## 2020-04-21 LAB — CUP PACEART REMOTE DEVICE CHECK
Date Time Interrogation Session: 20211017233338
Implantable Pulse Generator Implant Date: 20210614

## 2020-04-24 NOTE — Progress Notes (Signed)
Carelink Summary Report / Loop Recorder 

## 2020-05-04 DIAGNOSIS — H40023 Open angle with borderline findings, high risk, bilateral: Secondary | ICD-10-CM | POA: Diagnosis not present

## 2020-05-04 DIAGNOSIS — H04123 Dry eye syndrome of bilateral lacrimal glands: Secondary | ICD-10-CM | POA: Diagnosis not present

## 2020-05-06 DIAGNOSIS — M79644 Pain in right finger(s): Secondary | ICD-10-CM | POA: Diagnosis not present

## 2020-05-06 DIAGNOSIS — Z79899 Other long term (current) drug therapy: Secondary | ICD-10-CM | POA: Diagnosis not present

## 2020-05-06 DIAGNOSIS — M25569 Pain in unspecified knee: Secondary | ICD-10-CM | POA: Diagnosis not present

## 2020-05-06 DIAGNOSIS — M150 Primary generalized (osteo)arthritis: Secondary | ICD-10-CM | POA: Diagnosis not present

## 2020-05-06 DIAGNOSIS — M3501 Sicca syndrome with keratoconjunctivitis: Secondary | ICD-10-CM | POA: Diagnosis not present

## 2020-05-06 DIAGNOSIS — M064 Inflammatory polyarthropathy: Secondary | ICD-10-CM | POA: Diagnosis not present

## 2020-05-21 ENCOUNTER — Ambulatory Visit (INDEPENDENT_AMBULATORY_CARE_PROVIDER_SITE_OTHER): Payer: Medicare Other

## 2020-05-21 DIAGNOSIS — R55 Syncope and collapse: Secondary | ICD-10-CM | POA: Diagnosis not present

## 2020-05-23 LAB — CUP PACEART REMOTE DEVICE CHECK
Date Time Interrogation Session: 20211119223612
Implantable Pulse Generator Implant Date: 20210614

## 2020-05-25 NOTE — Progress Notes (Signed)
Carelink Summary Report / Loop Recorder 

## 2020-06-22 ENCOUNTER — Ambulatory Visit (INDEPENDENT_AMBULATORY_CARE_PROVIDER_SITE_OTHER): Payer: Medicare Other

## 2020-06-22 DIAGNOSIS — R55 Syncope and collapse: Secondary | ICD-10-CM

## 2020-06-25 LAB — CUP PACEART REMOTE DEVICE CHECK
Date Time Interrogation Session: 20211222223739
Implantable Pulse Generator Implant Date: 20210614

## 2020-07-06 DIAGNOSIS — M47816 Spondylosis without myelopathy or radiculopathy, lumbar region: Secondary | ICD-10-CM | POA: Diagnosis not present

## 2020-07-06 NOTE — Progress Notes (Signed)
Carelink Summary Report / Loop Recorder 

## 2020-07-13 DIAGNOSIS — M25572 Pain in left ankle and joints of left foot: Secondary | ICD-10-CM | POA: Diagnosis not present

## 2020-07-13 DIAGNOSIS — M25571 Pain in right ankle and joints of right foot: Secondary | ICD-10-CM | POA: Diagnosis not present

## 2020-07-13 DIAGNOSIS — G5762 Lesion of plantar nerve, left lower limb: Secondary | ICD-10-CM | POA: Diagnosis not present

## 2020-07-13 DIAGNOSIS — G5761 Lesion of plantar nerve, right lower limb: Secondary | ICD-10-CM | POA: Diagnosis not present

## 2020-07-16 DIAGNOSIS — M47816 Spondylosis without myelopathy or radiculopathy, lumbar region: Secondary | ICD-10-CM | POA: Diagnosis not present

## 2020-07-23 ENCOUNTER — Ambulatory Visit (INDEPENDENT_AMBULATORY_CARE_PROVIDER_SITE_OTHER): Payer: Medicare Other

## 2020-07-23 DIAGNOSIS — R55 Syncope and collapse: Secondary | ICD-10-CM

## 2020-07-28 DIAGNOSIS — M7751 Other enthesopathy of right foot: Secondary | ICD-10-CM | POA: Diagnosis not present

## 2020-07-28 DIAGNOSIS — M7752 Other enthesopathy of left foot: Secondary | ICD-10-CM | POA: Diagnosis not present

## 2020-07-28 DIAGNOSIS — G5761 Lesion of plantar nerve, right lower limb: Secondary | ICD-10-CM | POA: Diagnosis not present

## 2020-07-29 LAB — CUP PACEART REMOTE DEVICE CHECK
Date Time Interrogation Session: 20220124223812
Implantable Pulse Generator Implant Date: 20210614

## 2020-08-03 DIAGNOSIS — M47816 Spondylosis without myelopathy or radiculopathy, lumbar region: Secondary | ICD-10-CM | POA: Diagnosis not present

## 2020-08-05 NOTE — Progress Notes (Signed)
Carelink Summary Report / Loop Recorder 

## 2020-08-12 DIAGNOSIS — G5761 Lesion of plantar nerve, right lower limb: Secondary | ICD-10-CM | POA: Diagnosis not present

## 2020-08-12 DIAGNOSIS — M7672 Peroneal tendinitis, left leg: Secondary | ICD-10-CM | POA: Diagnosis not present

## 2020-08-12 DIAGNOSIS — M7751 Other enthesopathy of right foot: Secondary | ICD-10-CM | POA: Diagnosis not present

## 2020-08-12 DIAGNOSIS — M25572 Pain in left ankle and joints of left foot: Secondary | ICD-10-CM | POA: Diagnosis not present

## 2020-08-19 DIAGNOSIS — M47816 Spondylosis without myelopathy or radiculopathy, lumbar region: Secondary | ICD-10-CM | POA: Diagnosis not present

## 2020-08-24 ENCOUNTER — Ambulatory Visit (INDEPENDENT_AMBULATORY_CARE_PROVIDER_SITE_OTHER): Payer: Medicare Other

## 2020-08-24 DIAGNOSIS — R55 Syncope and collapse: Secondary | ICD-10-CM | POA: Diagnosis not present

## 2020-08-26 DIAGNOSIS — B079 Viral wart, unspecified: Secondary | ICD-10-CM | POA: Diagnosis not present

## 2020-08-28 ENCOUNTER — Telehealth: Payer: Self-pay | Admitting: Cardiovascular Disease

## 2020-08-28 MED ORDER — ROSUVASTATIN CALCIUM 20 MG PO TABS: 20.0000 mg | ORAL_TABLET | Freq: Every day | ORAL | 3 refills | Status: DC

## 2020-08-28 NOTE — Telephone Encounter (Signed)
Refill sent to the pharmacy electronically.  

## 2020-08-28 NOTE — Telephone Encounter (Signed)
*  STAT* If patient is at the pharmacy, call can be transferred to refill team.   1. Which medications need to be refilled? (please list name of each medication and dose if known)  rosuvastatin (CRESTOR) 20 MG tablet  2. Which pharmacy/location (including street and city if local pharmacy) is medication to be sent to? [facility] [place] [place]  3. Do they need a 30 day or 90 day supply? 90 with refills    Patient said her insurance has changed and she needs all medications to go to the mail order service now.

## 2020-08-31 DIAGNOSIS — I110 Hypertensive heart disease with heart failure: Secondary | ICD-10-CM | POA: Diagnosis not present

## 2020-08-31 DIAGNOSIS — I5032 Chronic diastolic (congestive) heart failure: Secondary | ICD-10-CM | POA: Diagnosis not present

## 2020-08-31 DIAGNOSIS — E782 Mixed hyperlipidemia: Secondary | ICD-10-CM | POA: Diagnosis not present

## 2020-08-31 LAB — CUP PACEART REMOTE DEVICE CHECK
Date Time Interrogation Session: 20220226223927
Implantable Pulse Generator Implant Date: 20210614

## 2020-08-31 NOTE — Progress Notes (Signed)
Carelink Summary Report / Loop Recorder 

## 2020-09-09 DIAGNOSIS — M47816 Spondylosis without myelopathy or radiculopathy, lumbar region: Secondary | ICD-10-CM | POA: Diagnosis not present

## 2020-09-10 DIAGNOSIS — B079 Viral wart, unspecified: Secondary | ICD-10-CM | POA: Diagnosis not present

## 2020-09-16 DIAGNOSIS — M064 Inflammatory polyarthropathy: Secondary | ICD-10-CM | POA: Diagnosis not present

## 2020-09-16 DIAGNOSIS — Z79899 Other long term (current) drug therapy: Secondary | ICD-10-CM | POA: Diagnosis not present

## 2020-09-16 DIAGNOSIS — M150 Primary generalized (osteo)arthritis: Secondary | ICD-10-CM | POA: Diagnosis not present

## 2020-09-16 DIAGNOSIS — M3501 Sicca syndrome with keratoconjunctivitis: Secondary | ICD-10-CM | POA: Diagnosis not present

## 2020-09-22 DIAGNOSIS — M47816 Spondylosis without myelopathy or radiculopathy, lumbar region: Secondary | ICD-10-CM | POA: Diagnosis not present

## 2020-09-24 DIAGNOSIS — B079 Viral wart, unspecified: Secondary | ICD-10-CM | POA: Diagnosis not present

## 2020-10-01 DIAGNOSIS — M150 Primary generalized (osteo)arthritis: Secondary | ICD-10-CM | POA: Diagnosis not present

## 2020-10-01 DIAGNOSIS — E782 Mixed hyperlipidemia: Secondary | ICD-10-CM | POA: Diagnosis not present

## 2020-10-01 DIAGNOSIS — I110 Hypertensive heart disease with heart failure: Secondary | ICD-10-CM | POA: Diagnosis not present

## 2020-10-01 DIAGNOSIS — I5032 Chronic diastolic (congestive) heart failure: Secondary | ICD-10-CM | POA: Diagnosis not present

## 2020-10-02 ENCOUNTER — Other Ambulatory Visit: Payer: Self-pay | Admitting: Internal Medicine

## 2020-10-02 ENCOUNTER — Ambulatory Visit (INDEPENDENT_AMBULATORY_CARE_PROVIDER_SITE_OTHER): Payer: Medicare Other

## 2020-10-02 DIAGNOSIS — R55 Syncope and collapse: Secondary | ICD-10-CM

## 2020-10-02 DIAGNOSIS — Z1231 Encounter for screening mammogram for malignant neoplasm of breast: Secondary | ICD-10-CM

## 2020-10-02 LAB — CUP PACEART REMOTE DEVICE CHECK
Date Time Interrogation Session: 20220331234138
Implantable Pulse Generator Implant Date: 20210614

## 2020-10-07 DIAGNOSIS — B079 Viral wart, unspecified: Secondary | ICD-10-CM | POA: Diagnosis not present

## 2020-10-08 DIAGNOSIS — M47816 Spondylosis without myelopathy or radiculopathy, lumbar region: Secondary | ICD-10-CM | POA: Diagnosis not present

## 2020-10-13 NOTE — Progress Notes (Signed)
Carelink Summary Report / Loop Recorder 

## 2020-10-22 DIAGNOSIS — D696 Thrombocytopenia, unspecified: Secondary | ICD-10-CM | POA: Diagnosis not present

## 2020-10-22 DIAGNOSIS — E782 Mixed hyperlipidemia: Secondary | ICD-10-CM | POA: Diagnosis not present

## 2020-10-22 DIAGNOSIS — I10 Essential (primary) hypertension: Secondary | ICD-10-CM | POA: Diagnosis not present

## 2020-10-22 DIAGNOSIS — I5032 Chronic diastolic (congestive) heart failure: Secondary | ICD-10-CM | POA: Diagnosis not present

## 2020-10-22 DIAGNOSIS — Z0000 Encounter for general adult medical examination without abnormal findings: Secondary | ICD-10-CM | POA: Diagnosis not present

## 2020-10-23 DIAGNOSIS — B079 Viral wart, unspecified: Secondary | ICD-10-CM | POA: Diagnosis not present

## 2020-10-27 DIAGNOSIS — M329 Systemic lupus erythematosus, unspecified: Secondary | ICD-10-CM | POA: Diagnosis not present

## 2020-10-27 DIAGNOSIS — C8300 Small cell B-cell lymphoma, unspecified site: Secondary | ICD-10-CM | POA: Diagnosis not present

## 2020-10-27 DIAGNOSIS — M3501 Sicca syndrome with keratoconjunctivitis: Secondary | ICD-10-CM | POA: Diagnosis not present

## 2020-10-27 DIAGNOSIS — Z0000 Encounter for general adult medical examination without abnormal findings: Secondary | ICD-10-CM | POA: Diagnosis not present

## 2020-10-28 NOTE — Progress Notes (Signed)
Cardiology Office Note   Date:  10/29/2020   ID:  [name], DOB 03-Feb-1951, MRN [id number]  PCP:  Georgianne Fick, MD  Cardiologist:   Chilton Si, MD   No chief complaint on file.    History of Present Illness: [name] is a 70 y.o. female with prior stroke, rheumatoid arthritis, lupus, interstitial lung disease, hypertension, prior lymphoplasmacytoid lymphoma, and asthma here for follow up.  She was initially seen in the ED 04/2018 with chest pain.  She reported L shoulder and chest wall pain.  EKG revealed diffuse T wave inversions.  Cardiac enzymes were negative x3.  She was referred for outpatient stress testing and had a Lexiscan Myoview 05/07/2018 that revealed LVEF 68%.  There was no ischemia.  She previously had an echo 01/2018 that revealed LVEF 60 to 65% with grade 2 diastolic dysfunction.    [name] had an episode of near syncope 03/2019.  Since then metoprolol was stopped.  She saw Dr. Salena Saner in and had an implantable loop monitor placed 12/2019.  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  She denies any recurrent syncope since that time.  Today, she reports that she fell again on 08/10/2020. As she was turning around, she fell forward and doesn't know exactly how it happened. She fell on her knees, but did not have any significant injuries. Her balance is still off.  At one time she went to physical therapy for her balance and it seemed to help a bit. She denies syncope and lightheadedness.  There was no preceding chest pain or shortness of breath.  Her blood pressure at home is stable and well controlled.  She tries to participate in formal exercise, but is limited due to her stiff back. On 10/08/20 she had an ablation on her back. As a result she no longer has back pain but is still stiff. Prior to the ablation she was in constant back pain. She tries to walk at the store at least 3 times a week. She has a Research scientist (physical sciences) at [employer] and I recommended she use  the pool for formal exercise. However, she quit using the pool there due to not tolerating the chlorine fumes. She gets a little short of breath when walking but she believes this is normal due to exercise. Otherwise she has no exertional symptoms. She found out she has arthritis in her feet, and it feels like the ball in her foot is large.  Of note, she feels thirsty constantly. If she doesn't take her furosemide at night, she is unable to urinate the next day until she takes her pill again in the afternoon.  She denies any chest pain or palpitations. No orthopnea or PND.   Past Medical History:  Diagnosis Date  . Acid reflux   . Arthritis   . Asthma   . Carotid stenosis 10/29/2020  . Chronic diastolic heart failure (HCC) 08/30/2019  . Gait instability 10/29/2020  . Hypertension   . Lupus (HCC) 07/13/2011  . 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 Near syncope 08/30/2019  . PONV (postoperative nausea and vomiting)   . Stroke [facility])    lt side weakness    Past Surgical History:  Procedure Laterality Date  . ABDOMINAL HYSTERECTOMY    . BACK SURGERY    . CHOLECYSTECTOMY    . COLONOSCOPY    . FOOT SURGERY     x2  . MASS EXCISION     back  . rortator  rotator cuff  . TOTAL SHOULDER ARTHROPLASTY Left 08/16/2018   Procedure: LEFT SHOULDER REVERSE TOTAL SHOULDER ARTHROPLASTY AND BICEPS TENOTOMY;  Surgeon: Tania Ade, MD;  Location: [place];  Service: Orthopedics;  Laterality: Left;     Current Outpatient Medications  Medication Sig Dispense Refill  . acetaminophen (TYLENOL) 500 MG tablet Take 1,000 mg by mouth every 6 (six) hours as needed for mild pain or headache.    Marland Kitchen aspirin 81 MG chewable tablet Chew by mouth daily.    . Biotin 1000 MCG tablet Take 1,000 mcg by mouth daily.    . cholecalciferol (VITAMIN D) 1000 UNITS tablet Take 1,000 Units by mouth daily.     . fenoprofen (NALFON) 600 MG TABS tablet Take 600 mg by mouth.    . folic acid (FOLVITE) 1 MG  tablet Take 1 tablet by mouth daily.    . hydroxychloroquine (PLAQUENIL) 200 MG tablet Take 400 mg by mouth daily.     Marland Kitchen latanoprost (XALATAN) 0.005 % ophthalmic solution Place 1 drop into both eyes at bedtime.    Marland Kitchen losartan (COZAAR) 100 MG tablet Take 100 mg by mouth daily.    Marland Kitchen omega-3 acid ethyl esters (LOVAZA) 1 g capsule Take by mouth 2 (two) times daily.    . pantoprazole (PROTONIX) 40 MG tablet Take 40 mg by mouth daily.    . potassium chloride SA (K-DUR,KLOR-CON) 20 MEQ tablet Take 2 tablets (40 mEq total) by mouth 2 (two) times daily.    . rosuvastatin (CRESTOR) 20 MG tablet Take 1 tablet (20 mg total) by mouth daily. 90 tablet 3  . Turmeric 500 MG CAPS Take by mouth.    . vitamin B-12 (CYANOCOBALAMIN) 1000 MCG tablet Take 1,000 mcg by mouth daily.    Marland Kitchen VITAMIN E PO Take by mouth.    . furosemide (LASIX) 80 MG tablet TAKE 1 AND 1/2 TABLETS DAILY 135 tablet 3   Current Facility-Administered Medications  Medication Dose Route Frequency Provider Last Rate Last Admin  . lidocaine-EPINEPHrine (XYLOCAINE W/EPI) 1 %-1:100000 (with pres) injection 10 mL  10 mL Infiltration Once Croitoru, Mihai, MD        Allergies:   Iodinated diagnostic agents, Ciprofloxacin, E-mycin [erythromycin base], and Oxycodone    Social History:  The patient  reports that she quit smoking about 26 years ago. She has a 10.00 pack-year smoking history. She has never used smokeless tobacco. She reports previous alcohol use. She reports that she does not use drugs.   Family History:  The patient's family history includes Coronary artery disease in her brother; Lung disease in her father.    ROS:   Please see the history of present illness. (+) Falling (+) Back stiffness (+) Shortness of breath All other systems are reviewed and negative.     PHYSICAL EXAM: VS:  BP 120/60 (BP Location: Left Arm, Patient Position: Sitting, Cuff Size: Large)   Pulse 62   Ht 5\' 6"  (1.676 m)   Wt 200 lb 3.2 oz (90.8 kg)    SpO2 99%   BMI 32.31 kg/m  , BMI Body mass index is 32.31 kg/m. GENERAL:  Well appearing HEENT: Pupils equal round and reactive, fundi not visualized, oral mucosa unremarkable NECK:  No jugular venous distention, waveform within normal limits, carotid upstroke brisk and symmetric, no bruits.  R clavicular head enlarged LUNGS:  Clear to auscultation bilaterally HEART:  RRR.  PMI not displaced or sustained,S1 and S2 within normal limits, no S3, no S4, no clicks, no rubs,  no murmurs ABD:  Flat, positive bowel sounds normal in frequency in pitch, no bruits, no rebound, no guarding, no midline pulsatile mass, no hepatomegaly, no splenomegaly EXT:  2 plus pulses throughout, left ankle edema, no cyanosis no clubbing SKIN:  No rashes no nodules NEURO:  Cranial nerves II through XII grossly intact, motor grossly intact throughout PSYCH:  Cognitively intact, oriented to person place and time   EKG:   04/08/2020: EKG was not ordered today. 10/29/2020: NSR. Rate 62 bpm. Lateral T wave inversions.     Lexiscan Myoview 05/07/18:  Nuclear stress EF: 68%.  There was no ST segment deviation noted during stress.  No T wave inversion was noted during stress.  The study is normal.  This is a low risk study.  The left ventricular ejection fraction is hyperdynamic (>65%).   Echo 01/29/18: Study Conclusions  - Left ventricle: The cavity size was normal. Wall thickness was   normal. Systolic function was normal. The estimated ejection   fraction was in the range of 60% to 65%. Wall motion was normal;   there were no regional wall motion abnormalities. Features are   consistent with a pseudonormal left ventricular filling pattern,   with concomitant abnormal relaxation and increased filling   pressure (grade 2 diastolic dysfunction). - Mitral valve: Calcified annulus. There was mild regurgitation. - Pericardium, extracardiac: A trivial pericardial effusion was   identified.  Impressions:  -  Normal LV systolic function; moderate diastolic dysfunction;   mild MR.  Recent Labs: 11/19/2019: ALT 10 01/30/2020: BUN 17; Creatinine, Ser 0.89; Potassium 4.2; Sodium 141    Lipid Panel    Component Value Date/Time   CHOL 156 11/19/2019 1032   TRIG 56 11/19/2019 1032   HDL 81 11/19/2019 1032   CHOLHDL 1.9 11/19/2019 1032   LDLCALC 63 11/19/2019 1032     09/05/17: Total cholesterol 212, triglycerides 50, HDL 76, LDL 126    Wt Readings from Last 3 Encounters:  10/29/20 200 lb 3.2 oz (90.8 kg)  04/08/20 199 lb (90.3 kg)  12/10/19 195 lb (88.5 kg)      ASSESSMENT AND PLAN: Chronic diastolic heart failure (HCC) Volume status is well-controlled on furosemide.  She is taking 120 mg every day.  She had labs with her PCP and reports that her renal function and potassium were stable.  Continue current regimen.  Essential hypertension Blood pressure well have been controlled on losartan.  Carotid stenosis Mild 10/2019.  Continue aspirin and rosuvastatin.  Gait instability Patient had another fall.  She had no preceding cardiac symptoms.  She has an implantable monitor in place and no arrhythmias were detected.  I suspect that her recurrent falls are more related to gait instability.  She denies syncope with this episode.  She previously did tried physical therapy with minimal improvement in her symptoms.  Continue using cane.    Current medicines are reviewed at length with the patient today.  The patient does not have concerns regarding medicines.  The following changes have been made:  no change  Labs/ tests ordered today include:   Orders Placed This Encounter  Procedures  . EKG 12-Lead     Disposition:   FU with Armstrong Creasy C. Oval Linsey, MD, [facility] in 6 months    I,Mathew Stumpf,acting as a Education administrator for Skeet Latch, MD.,have documented all relevant documentation on the behalf of Skeet Latch, MD,as directed by  Skeet Latch, MD while in the presence of Skeet Latch, MD.    Signed, Jonelle Sidle  Joya Gaskins, MD, [facility]  10/29/2020 8:54 AM    [place]

## 2020-10-29 ENCOUNTER — Other Ambulatory Visit: Payer: Self-pay

## 2020-10-29 ENCOUNTER — Ambulatory Visit: Payer: Medicare Other | Admitting: Cardiovascular Disease

## 2020-10-29 ENCOUNTER — Encounter: Payer: Self-pay | Admitting: Cardiovascular Disease

## 2020-10-29 VITALS — BP 120/60 | HR 62 | Ht 66.0 in | Wt 200.2 lb

## 2020-10-29 DIAGNOSIS — R2681 Unsteadiness on feet: Secondary | ICD-10-CM | POA: Diagnosis not present

## 2020-10-29 DIAGNOSIS — I5032 Chronic diastolic (congestive) heart failure: Secondary | ICD-10-CM | POA: Diagnosis not present

## 2020-10-29 DIAGNOSIS — I6529 Occlusion and stenosis of unspecified carotid artery: Secondary | ICD-10-CM

## 2020-10-29 DIAGNOSIS — I10 Essential (primary) hypertension: Secondary | ICD-10-CM | POA: Diagnosis not present

## 2020-10-29 DIAGNOSIS — I6523 Occlusion and stenosis of bilateral carotid arteries: Secondary | ICD-10-CM | POA: Diagnosis not present

## 2020-10-29 DIAGNOSIS — R269 Unspecified abnormalities of gait and mobility: Secondary | ICD-10-CM | POA: Insufficient documentation

## 2020-10-29 HISTORY — DX: Unsteadiness on feet: R26.81

## 2020-10-29 HISTORY — DX: Occlusion and stenosis of unspecified carotid artery: I65.29

## 2020-10-29 MED ORDER — FUROSEMIDE 80 MG PO TABS: ORAL_TABLET | ORAL | 3 refills | Status: DC

## 2020-10-29 NOTE — Assessment & Plan Note (Signed)
Patient had another fall.  She had no preceding cardiac symptoms.  She has an implantable monitor in place and no arrhythmias were detected.  I suspect that her recurrent falls are more related to gait instability.  She denies syncope with this episode.  She previously did tried physical therapy with minimal improvement in her symptoms.  Continue using cane.

## 2020-10-29 NOTE — Assessment & Plan Note (Signed)
Blood pressure well have been controlled on losartan.

## 2020-10-29 NOTE — Patient Instructions (Signed)
Medication Instructions:  Your physician recommends that you continue on your current medications as directed. Please refer to the Current Medication list given to you today.   *If you need a refill on your cardiac medications before your next appointment, please call your pharmacy*  Lab Work: NONE   Testing/Procedures: NONE  Follow-Up: At [facility], you and your health needs are our priority.  As part of our continuing mission to provide you with exceptional heart care, we have created designated Provider Care Teams.  These Care Teams include your primary Cardiologist (physician) and Advanced Practice Providers (APPs -  Physician Assistants and Nurse Practitioners) who all work together to provide you with the care you need, when you need it.  We recommend signing up for the patient portal called "MyChart".  Sign up information is provided on this After Visit Summary.  MyChart is used to connect with patients for Virtual Visits (Telemedicine).  Patients are able to view lab/test results, encounter notes, upcoming appointments, etc.  Non-urgent messages can be sent to your provider as well.   To learn more about what you can do with MyChart, go to [email or web address].    Your next appointment:   6 month(s)  The format for your next appointment:   In Person  Provider:   DR [place] AT [facility]    

## 2020-10-29 NOTE — Assessment & Plan Note (Signed)
Volume status is well-controlled on furosemide.  She is taking 120 mg every day.  She had labs with her PCP and reports that her renal function and potassium were stable.  Continue current regimen.

## 2020-10-29 NOTE — Assessment & Plan Note (Signed)
Mild 10/2019.  Continue aspirin and rosuvastatin.

## 2020-11-02 ENCOUNTER — Ambulatory Visit (INDEPENDENT_AMBULATORY_CARE_PROVIDER_SITE_OTHER): Payer: Medicare Other

## 2020-11-02 DIAGNOSIS — R55 Syncope and collapse: Secondary | ICD-10-CM | POA: Diagnosis not present

## 2020-11-04 LAB — CUP PACEART REMOTE DEVICE CHECK
Date Time Interrogation Session: 20220503234514
Implantable Pulse Generator Implant Date: 20210614

## 2020-11-09 DIAGNOSIS — M47816 Spondylosis without myelopathy or radiculopathy, lumbar region: Secondary | ICD-10-CM | POA: Diagnosis not present

## 2020-11-20 NOTE — Progress Notes (Signed)
Carelink Summary Report / Loop Recorder 

## 2020-11-24 ENCOUNTER — Ambulatory Visit
Admission: RE | Admit: 2020-11-24 | Discharge: 2020-11-24 | Disposition: A | Discharge status: Home / Self Care | Payer: Medicare Other | Source: Ambulatory Visit | Attending: Internal Medicine | Admitting: Internal Medicine

## 2020-11-24 ENCOUNTER — Other Ambulatory Visit: Payer: Self-pay

## 2020-11-24 DIAGNOSIS — Z1231 Encounter for screening mammogram for malignant neoplasm of breast: Secondary | ICD-10-CM

## 2020-11-25 DIAGNOSIS — B079 Viral wart, unspecified: Secondary | ICD-10-CM | POA: Diagnosis not present

## 2020-12-01 DIAGNOSIS — M150 Primary generalized (osteo)arthritis: Secondary | ICD-10-CM | POA: Diagnosis not present

## 2020-12-01 DIAGNOSIS — I110 Hypertensive heart disease with heart failure: Secondary | ICD-10-CM | POA: Diagnosis not present

## 2020-12-01 DIAGNOSIS — I5032 Chronic diastolic (congestive) heart failure: Secondary | ICD-10-CM | POA: Diagnosis not present

## 2020-12-01 DIAGNOSIS — E782 Mixed hyperlipidemia: Secondary | ICD-10-CM | POA: Diagnosis not present

## 2020-12-07 ENCOUNTER — Ambulatory Visit (INDEPENDENT_AMBULATORY_CARE_PROVIDER_SITE_OTHER): Payer: Medicare Other

## 2020-12-07 DIAGNOSIS — R55 Syncope and collapse: Secondary | ICD-10-CM | POA: Diagnosis not present

## 2020-12-07 LAB — CUP PACEART REMOTE DEVICE CHECK
Date Time Interrogation Session: 20220606004735
Implantable Pulse Generator Implant Date: 20210614

## 2020-12-09 DIAGNOSIS — B079 Viral wart, unspecified: Secondary | ICD-10-CM | POA: Diagnosis not present

## 2020-12-28 NOTE — Progress Notes (Signed)
Carelink Summary Report / Loop Recorder 

## 2020-12-30 DIAGNOSIS — R0602 Shortness of breath: Secondary | ICD-10-CM | POA: Diagnosis not present

## 2020-12-30 DIAGNOSIS — M25551 Pain in right hip: Secondary | ICD-10-CM | POA: Diagnosis not present

## 2020-12-31 DIAGNOSIS — E782 Mixed hyperlipidemia: Secondary | ICD-10-CM | POA: Diagnosis not present

## 2020-12-31 DIAGNOSIS — I5032 Chronic diastolic (congestive) heart failure: Secondary | ICD-10-CM | POA: Diagnosis not present

## 2020-12-31 DIAGNOSIS — M150 Primary generalized (osteo)arthritis: Secondary | ICD-10-CM | POA: Diagnosis not present

## 2020-12-31 DIAGNOSIS — I110 Hypertensive heart disease with heart failure: Secondary | ICD-10-CM | POA: Diagnosis not present

## 2021-01-10 LAB — CUP PACEART REMOTE DEVICE CHECK
Date Time Interrogation Session: 20220709004400
Implantable Pulse Generator Implant Date: 20210614

## 2021-01-11 ENCOUNTER — Ambulatory Visit (INDEPENDENT_AMBULATORY_CARE_PROVIDER_SITE_OTHER): Payer: Medicare Other

## 2021-01-11 DIAGNOSIS — R55 Syncope and collapse: Secondary | ICD-10-CM

## 2021-01-19 DIAGNOSIS — M1612 Unilateral primary osteoarthritis, left hip: Secondary | ICD-10-CM | POA: Diagnosis not present

## 2021-01-19 DIAGNOSIS — M1611 Unilateral primary osteoarthritis, right hip: Secondary | ICD-10-CM | POA: Diagnosis not present

## 2021-01-20 DIAGNOSIS — J811 Chronic pulmonary edema: Secondary | ICD-10-CM | POA: Diagnosis not present

## 2021-01-20 DIAGNOSIS — J4541 Moderate persistent asthma with (acute) exacerbation: Secondary | ICD-10-CM | POA: Diagnosis not present

## 2021-01-20 DIAGNOSIS — R0602 Shortness of breath: Secondary | ICD-10-CM | POA: Diagnosis not present

## 2021-01-20 DIAGNOSIS — I517 Cardiomegaly: Secondary | ICD-10-CM | POA: Diagnosis not present

## 2021-01-20 DIAGNOSIS — M25551 Pain in right hip: Secondary | ICD-10-CM | POA: Diagnosis not present

## 2021-01-20 DIAGNOSIS — R7989 Other specified abnormal findings of blood chemistry: Secondary | ICD-10-CM | POA: Diagnosis not present

## 2021-01-20 DIAGNOSIS — J302 Other seasonal allergic rhinitis: Secondary | ICD-10-CM | POA: Diagnosis not present

## 2021-01-27 DIAGNOSIS — H2513 Age-related nuclear cataract, bilateral: Secondary | ICD-10-CM | POA: Diagnosis not present

## 2021-01-27 DIAGNOSIS — H40013 Open angle with borderline findings, low risk, bilateral: Secondary | ICD-10-CM | POA: Diagnosis not present

## 2021-01-27 DIAGNOSIS — Z79899 Other long term (current) drug therapy: Secondary | ICD-10-CM | POA: Diagnosis not present

## 2021-01-27 DIAGNOSIS — H04123 Dry eye syndrome of bilateral lacrimal glands: Secondary | ICD-10-CM | POA: Diagnosis not present

## 2021-01-31 DIAGNOSIS — I5032 Chronic diastolic (congestive) heart failure: Secondary | ICD-10-CM | POA: Diagnosis not present

## 2021-01-31 DIAGNOSIS — E782 Mixed hyperlipidemia: Secondary | ICD-10-CM | POA: Diagnosis not present

## 2021-01-31 DIAGNOSIS — I110 Hypertensive heart disease with heart failure: Secondary | ICD-10-CM | POA: Diagnosis not present

## 2021-01-31 DIAGNOSIS — M150 Primary generalized (osteo)arthritis: Secondary | ICD-10-CM | POA: Diagnosis not present

## 2021-02-03 DIAGNOSIS — H524 Presbyopia: Secondary | ICD-10-CM | POA: Diagnosis not present

## 2021-02-03 NOTE — Progress Notes (Signed)
Carelink Summary Report / Loop Recorder 

## 2021-02-08 DIAGNOSIS — J45909 Unspecified asthma, uncomplicated: Secondary | ICD-10-CM | POA: Diagnosis not present

## 2021-02-08 DIAGNOSIS — W19XXXA Unspecified fall, initial encounter: Secondary | ICD-10-CM | POA: Diagnosis not present

## 2021-02-11 DIAGNOSIS — M1611 Unilateral primary osteoarthritis, right hip: Secondary | ICD-10-CM | POA: Diagnosis not present

## 2021-02-15 ENCOUNTER — Ambulatory Visit (INDEPENDENT_AMBULATORY_CARE_PROVIDER_SITE_OTHER): Payer: Medicare Other

## 2021-02-15 ENCOUNTER — Telehealth: Payer: Self-pay | Admitting: Cardiovascular Disease

## 2021-02-15 DIAGNOSIS — R55 Syncope and collapse: Secondary | ICD-10-CM | POA: Diagnosis not present

## 2021-02-15 LAB — CUP PACEART REMOTE DEVICE CHECK
Date Time Interrogation Session: 20220811005054
Implantable Pulse Generator Implant Date: 20210614

## 2021-02-15 NOTE — Telephone Encounter (Signed)
   Name: [name]  DOB: 05/07/1951  MRN: [id number]   Primary Cardiologist: Skeet Latch, MD  Chart reviewed as part of pre-operative protocol coverage. Patient was contacted 02/15/2021 in reference to pre-operative risk assessment for pending surgery as outlined below.  RUFUS MORRISETTE was last seen on 10/29/20 by Dr. Oval Linsey.  Since that day, ZARAYAH PASEK has done fine from a cardiac standpoint. She notes improvement in her SOB over the past several months as her asthma has been better controlled. She had a mechanical fall 1 week ago which has limited her activity, though prior to that she was able to complete 4 METs without anginal complaints  Therefore, based on ACC/AHA guidelines, the patient would be at acceptable risk for the planned procedure without further cardiovascular testing.   The patient was advised that if she develops new symptoms prior to surgery to contact our office to arrange for a follow-up visit, and she verbalized understanding.  In regards to her aspirin, she reports being on aspirin for history of CVA. No objections to holding from a cardiac perspective if needed, though will defer final recommendations to her PCP given CVA.   I will route this recommendation to the requesting party via Epic fax function and remove from pre-op pool. Please call with questions.  Abigail Butts, PA-C 02/15/2021, 2:06 PM

## 2021-02-15 NOTE — Telephone Encounter (Signed)
   [facility] Pre-operative Risk Assessment    Request for surgical clearance:  What type of surgery is being performed? Right Hip Replacement  When is this surgery scheduled?  03/01/2021  What type of clearance is required (medical clearance vs. Pharmacy clearance to hold med vs. Both)? Both  Are there any medications that need to be held prior to surgery and how long? Baby Aspirin  Practice name and name of physician performing surgery?  [facility] Dr. Frederik Pear  What is your office phone number [phone]   7.   What is your office fax number [phone]  8.   Anesthesia type (None, local, MAC, general) ?  Spinal   Larina Bras 02/15/2021, 10:22 AM  _________________________________________________________________   (provider comments below)

## 2021-02-22 DIAGNOSIS — Z01818 Encounter for other preprocedural examination: Secondary | ICD-10-CM | POA: Diagnosis not present

## 2021-03-01 DIAGNOSIS — I5032 Chronic diastolic (congestive) heart failure: Secondary | ICD-10-CM | POA: Diagnosis not present

## 2021-03-01 DIAGNOSIS — Z01818 Encounter for other preprocedural examination: Secondary | ICD-10-CM | POA: Diagnosis not present

## 2021-03-01 DIAGNOSIS — I10 Essential (primary) hypertension: Secondary | ICD-10-CM | POA: Diagnosis not present

## 2021-03-01 DIAGNOSIS — E782 Mixed hyperlipidemia: Secondary | ICD-10-CM | POA: Diagnosis not present

## 2021-03-03 DIAGNOSIS — I110 Hypertensive heart disease with heart failure: Secondary | ICD-10-CM | POA: Diagnosis not present

## 2021-03-03 DIAGNOSIS — I5032 Chronic diastolic (congestive) heart failure: Secondary | ICD-10-CM | POA: Diagnosis not present

## 2021-03-03 DIAGNOSIS — Z01812 Encounter for preprocedural laboratory examination: Secondary | ICD-10-CM | POA: Diagnosis not present

## 2021-03-03 DIAGNOSIS — E782 Mixed hyperlipidemia: Secondary | ICD-10-CM | POA: Diagnosis not present

## 2021-03-03 DIAGNOSIS — M150 Primary generalized (osteo)arthritis: Secondary | ICD-10-CM | POA: Diagnosis not present

## 2021-03-05 NOTE — Progress Notes (Signed)
Carelink Summary Report / Loop Recorder 

## 2021-03-10 DIAGNOSIS — M1611 Unilateral primary osteoarthritis, right hip: Secondary | ICD-10-CM | POA: Diagnosis not present

## 2021-03-12 DIAGNOSIS — M1611 Unilateral primary osteoarthritis, right hip: Secondary | ICD-10-CM | POA: Diagnosis not present

## 2021-03-17 DIAGNOSIS — Z79899 Other long term (current) drug therapy: Secondary | ICD-10-CM | POA: Diagnosis not present

## 2021-03-17 DIAGNOSIS — M064 Inflammatory polyarthropathy: Secondary | ICD-10-CM | POA: Diagnosis not present

## 2021-03-17 DIAGNOSIS — M150 Primary generalized (osteo)arthritis: Secondary | ICD-10-CM | POA: Diagnosis not present

## 2021-03-17 DIAGNOSIS — M3501 Sicca syndrome with keratoconjunctivitis: Secondary | ICD-10-CM | POA: Diagnosis not present

## 2021-03-19 DIAGNOSIS — M1611 Unilateral primary osteoarthritis, right hip: Secondary | ICD-10-CM | POA: Diagnosis not present

## 2021-03-22 ENCOUNTER — Ambulatory Visit (INDEPENDENT_AMBULATORY_CARE_PROVIDER_SITE_OTHER): Payer: Medicare Other

## 2021-03-22 DIAGNOSIS — R55 Syncope and collapse: Secondary | ICD-10-CM

## 2021-03-22 DIAGNOSIS — Z9889 Other specified postprocedural states: Secondary | ICD-10-CM | POA: Diagnosis not present

## 2021-03-22 DIAGNOSIS — M6281 Muscle weakness (generalized): Secondary | ICD-10-CM | POA: Diagnosis not present

## 2021-03-22 DIAGNOSIS — R2689 Other abnormalities of gait and mobility: Secondary | ICD-10-CM | POA: Diagnosis not present

## 2021-03-22 DIAGNOSIS — Z96641 Presence of right artificial hip joint: Secondary | ICD-10-CM | POA: Diagnosis not present

## 2021-03-23 LAB — CUP PACEART REMOTE DEVICE CHECK
Date Time Interrogation Session: 20220913005023
Implantable Pulse Generator Implant Date: 20210614

## 2021-03-25 DIAGNOSIS — Z9889 Other specified postprocedural states: Secondary | ICD-10-CM | POA: Diagnosis not present

## 2021-03-25 DIAGNOSIS — Z96641 Presence of right artificial hip joint: Secondary | ICD-10-CM | POA: Diagnosis not present

## 2021-03-25 DIAGNOSIS — M6281 Muscle weakness (generalized): Secondary | ICD-10-CM | POA: Diagnosis not present

## 2021-03-25 DIAGNOSIS — R2689 Other abnormalities of gait and mobility: Secondary | ICD-10-CM | POA: Diagnosis not present

## 2021-03-26 NOTE — Progress Notes (Signed)
Carelink Summary Report / Loop Recorder 

## 2021-03-30 DIAGNOSIS — Z96641 Presence of right artificial hip joint: Secondary | ICD-10-CM | POA: Diagnosis not present

## 2021-03-30 DIAGNOSIS — M6281 Muscle weakness (generalized): Secondary | ICD-10-CM | POA: Diagnosis not present

## 2021-03-30 DIAGNOSIS — Z471 Aftercare following joint replacement surgery: Secondary | ICD-10-CM | POA: Diagnosis not present

## 2021-03-30 DIAGNOSIS — Z9889 Other specified postprocedural states: Secondary | ICD-10-CM | POA: Diagnosis not present

## 2021-03-30 DIAGNOSIS — R2689 Other abnormalities of gait and mobility: Secondary | ICD-10-CM | POA: Diagnosis not present

## 2021-04-01 DIAGNOSIS — M6281 Muscle weakness (generalized): Secondary | ICD-10-CM | POA: Diagnosis not present

## 2021-04-01 DIAGNOSIS — Z96641 Presence of right artificial hip joint: Secondary | ICD-10-CM | POA: Diagnosis not present

## 2021-04-01 DIAGNOSIS — R2689 Other abnormalities of gait and mobility: Secondary | ICD-10-CM | POA: Diagnosis not present

## 2021-04-01 DIAGNOSIS — Z9889 Other specified postprocedural states: Secondary | ICD-10-CM | POA: Diagnosis not present

## 2021-04-02 DIAGNOSIS — I5032 Chronic diastolic (congestive) heart failure: Secondary | ICD-10-CM | POA: Diagnosis not present

## 2021-04-02 DIAGNOSIS — I110 Hypertensive heart disease with heart failure: Secondary | ICD-10-CM | POA: Diagnosis not present

## 2021-04-02 DIAGNOSIS — E782 Mixed hyperlipidemia: Secondary | ICD-10-CM | POA: Diagnosis not present

## 2021-04-02 DIAGNOSIS — M150 Primary generalized (osteo)arthritis: Secondary | ICD-10-CM | POA: Diagnosis not present

## 2021-04-06 DIAGNOSIS — M6281 Muscle weakness (generalized): Secondary | ICD-10-CM | POA: Diagnosis not present

## 2021-04-06 DIAGNOSIS — Z96641 Presence of right artificial hip joint: Secondary | ICD-10-CM | POA: Diagnosis not present

## 2021-04-06 DIAGNOSIS — R2689 Other abnormalities of gait and mobility: Secondary | ICD-10-CM | POA: Diagnosis not present

## 2021-04-06 DIAGNOSIS — Z9889 Other specified postprocedural states: Secondary | ICD-10-CM | POA: Diagnosis not present

## 2021-04-08 DIAGNOSIS — R2689 Other abnormalities of gait and mobility: Secondary | ICD-10-CM | POA: Diagnosis not present

## 2021-04-08 DIAGNOSIS — Z9889 Other specified postprocedural states: Secondary | ICD-10-CM | POA: Diagnosis not present

## 2021-04-08 DIAGNOSIS — Z96641 Presence of right artificial hip joint: Secondary | ICD-10-CM | POA: Diagnosis not present

## 2021-04-08 DIAGNOSIS — M6281 Muscle weakness (generalized): Secondary | ICD-10-CM | POA: Diagnosis not present

## 2021-04-13 DIAGNOSIS — Z96641 Presence of right artificial hip joint: Secondary | ICD-10-CM | POA: Diagnosis not present

## 2021-04-13 DIAGNOSIS — M6281 Muscle weakness (generalized): Secondary | ICD-10-CM | POA: Diagnosis not present

## 2021-04-13 DIAGNOSIS — R2689 Other abnormalities of gait and mobility: Secondary | ICD-10-CM | POA: Diagnosis not present

## 2021-04-13 DIAGNOSIS — Z9889 Other specified postprocedural states: Secondary | ICD-10-CM | POA: Diagnosis not present

## 2021-04-15 DIAGNOSIS — Z96641 Presence of right artificial hip joint: Secondary | ICD-10-CM | POA: Diagnosis not present

## 2021-04-15 DIAGNOSIS — Z9889 Other specified postprocedural states: Secondary | ICD-10-CM | POA: Diagnosis not present

## 2021-04-15 DIAGNOSIS — M6281 Muscle weakness (generalized): Secondary | ICD-10-CM | POA: Diagnosis not present

## 2021-04-15 DIAGNOSIS — R2689 Other abnormalities of gait and mobility: Secondary | ICD-10-CM | POA: Diagnosis not present

## 2021-04-20 DIAGNOSIS — Z96641 Presence of right artificial hip joint: Secondary | ICD-10-CM | POA: Diagnosis not present

## 2021-04-20 DIAGNOSIS — M6281 Muscle weakness (generalized): Secondary | ICD-10-CM | POA: Diagnosis not present

## 2021-04-20 DIAGNOSIS — Z9889 Other specified postprocedural states: Secondary | ICD-10-CM | POA: Diagnosis not present

## 2021-04-20 DIAGNOSIS — R2689 Other abnormalities of gait and mobility: Secondary | ICD-10-CM | POA: Diagnosis not present

## 2021-04-22 DIAGNOSIS — R2689 Other abnormalities of gait and mobility: Secondary | ICD-10-CM | POA: Diagnosis not present

## 2021-04-22 DIAGNOSIS — Z96641 Presence of right artificial hip joint: Secondary | ICD-10-CM | POA: Diagnosis not present

## 2021-04-22 DIAGNOSIS — Z9889 Other specified postprocedural states: Secondary | ICD-10-CM | POA: Diagnosis not present

## 2021-04-22 DIAGNOSIS — M6281 Muscle weakness (generalized): Secondary | ICD-10-CM | POA: Diagnosis not present

## 2021-04-22 LAB — CUP PACEART REMOTE DEVICE CHECK
Date Time Interrogation Session: 20221016005634
Implantable Pulse Generator Implant Date: 20210614

## 2021-04-26 ENCOUNTER — Ambulatory Visit (INDEPENDENT_AMBULATORY_CARE_PROVIDER_SITE_OTHER): Payer: Medicare Other

## 2021-04-26 DIAGNOSIS — R55 Syncope and collapse: Secondary | ICD-10-CM

## 2021-04-27 DIAGNOSIS — M6281 Muscle weakness (generalized): Secondary | ICD-10-CM | POA: Diagnosis not present

## 2021-04-27 DIAGNOSIS — R2689 Other abnormalities of gait and mobility: Secondary | ICD-10-CM | POA: Diagnosis not present

## 2021-04-27 DIAGNOSIS — Z9889 Other specified postprocedural states: Secondary | ICD-10-CM | POA: Diagnosis not present

## 2021-04-27 DIAGNOSIS — Z96641 Presence of right artificial hip joint: Secondary | ICD-10-CM | POA: Diagnosis not present

## 2021-04-27 DIAGNOSIS — I10 Essential (primary) hypertension: Secondary | ICD-10-CM | POA: Diagnosis not present

## 2021-04-29 DIAGNOSIS — Z9889 Other specified postprocedural states: Secondary | ICD-10-CM | POA: Diagnosis not present

## 2021-04-29 DIAGNOSIS — M6281 Muscle weakness (generalized): Secondary | ICD-10-CM | POA: Diagnosis not present

## 2021-04-29 DIAGNOSIS — Z96641 Presence of right artificial hip joint: Secondary | ICD-10-CM | POA: Diagnosis not present

## 2021-04-29 DIAGNOSIS — R2689 Other abnormalities of gait and mobility: Secondary | ICD-10-CM | POA: Diagnosis not present

## 2021-05-03 DIAGNOSIS — I5032 Chronic diastolic (congestive) heart failure: Secondary | ICD-10-CM | POA: Diagnosis not present

## 2021-05-03 DIAGNOSIS — M150 Primary generalized (osteo)arthritis: Secondary | ICD-10-CM | POA: Diagnosis not present

## 2021-05-03 DIAGNOSIS — E782 Mixed hyperlipidemia: Secondary | ICD-10-CM | POA: Diagnosis not present

## 2021-05-03 DIAGNOSIS — I110 Hypertensive heart disease with heart failure: Secondary | ICD-10-CM | POA: Diagnosis not present

## 2021-05-04 DIAGNOSIS — E782 Mixed hyperlipidemia: Secondary | ICD-10-CM | POA: Diagnosis not present

## 2021-05-04 DIAGNOSIS — I10 Essential (primary) hypertension: Secondary | ICD-10-CM | POA: Diagnosis not present

## 2021-05-04 DIAGNOSIS — R7303 Prediabetes: Secondary | ICD-10-CM | POA: Diagnosis not present

## 2021-05-04 DIAGNOSIS — I5032 Chronic diastolic (congestive) heart failure: Secondary | ICD-10-CM | POA: Diagnosis not present

## 2021-05-04 NOTE — Progress Notes (Signed)
Carelink Summary Report / Loop Recorder 

## 2021-05-06 DIAGNOSIS — M6281 Muscle weakness (generalized): Secondary | ICD-10-CM | POA: Diagnosis not present

## 2021-05-06 DIAGNOSIS — Z96641 Presence of right artificial hip joint: Secondary | ICD-10-CM | POA: Diagnosis not present

## 2021-05-06 DIAGNOSIS — R2689 Other abnormalities of gait and mobility: Secondary | ICD-10-CM | POA: Diagnosis not present

## 2021-05-06 DIAGNOSIS — Z9889 Other specified postprocedural states: Secondary | ICD-10-CM | POA: Diagnosis not present

## 2021-05-13 ENCOUNTER — Encounter (HOSPITAL_BASED_OUTPATIENT_CLINIC_OR_DEPARTMENT_OTHER): Payer: Self-pay | Admitting: Cardiovascular Disease

## 2021-05-13 ENCOUNTER — Other Ambulatory Visit: Payer: Self-pay

## 2021-05-13 ENCOUNTER — Ambulatory Visit (HOSPITAL_BASED_OUTPATIENT_CLINIC_OR_DEPARTMENT_OTHER): Payer: Medicare Other | Admitting: Cardiovascular Disease

## 2021-05-13 DIAGNOSIS — R55 Syncope and collapse: Secondary | ICD-10-CM | POA: Diagnosis not present

## 2021-05-13 DIAGNOSIS — I5032 Chronic diastolic (congestive) heart failure: Secondary | ICD-10-CM

## 2021-05-13 DIAGNOSIS — I10 Essential (primary) hypertension: Secondary | ICD-10-CM | POA: Diagnosis not present

## 2021-05-13 NOTE — Assessment & Plan Note (Signed)
Blood pressure is elevated in the office today but has been generally well controlled at home.  She attributes this to whitecoat hypertension.  Continue losartan.

## 2021-05-13 NOTE — Assessment & Plan Note (Signed)
She is euvolemic and doing well.  She only has swelling in the right leg, which is the leg where she had her hip surgery.  Blood pressure control with losartan.  Continue Lasix.

## 2021-05-13 NOTE — Patient Instructions (Signed)
Medication Instructions:  Your physician recommends that you continue on your current medications as directed. Please refer to the Current Medication list given to you today.   *If you need a refill on your cardiac medications before your next appointment, please call your pharmacy*  Lab Work: NONE   Testing/Procedures: NONE   Follow-Up: At [facility], you and your health needs are our priority.  As part of our continuing mission to provide you with exceptional heart care, we have created designated Provider Care Teams.  These Care Teams include your primary Cardiologist (physician) and Advanced Practice Providers (APPs -  Physician Assistants and Nurse Practitioners) who all work together to provide you with the care you need, when you need it.  We recommend signing up for the patient portal called "MyChart".  Sign up information is provided on this After Visit Summary.  MyChart is used to connect with patients for Virtual Visits (Telemedicine).  Patients are able to view lab/test results, encounter notes, upcoming appointments, etc.  Non-urgent messages can be sent to your provider as well.   To learn more about what you can do with MyChart, go to [email or web address].    Your next appointment:   5 month(s)  The format for your next appointment:   In Person  Provider:   Skeet Latch, MD

## 2021-05-13 NOTE — Progress Notes (Signed)
Cardiology Office Note   Date:  05/13/2021   ID:  [name], DOB 03/16/1951, MRN [id number]  PCP:  Merrilee Seashore, MD  Cardiologist:   Skeet Latch, MD   No chief complaint on file.    History of Present Illness: [name] is a 70 y.o. female with prior stroke, rheumatoid arthritis, lupus, interstitial lung disease, hypertension, prior lymphoplasmacytoid lymphoma, and asthma here for follow up.  She was initially seen in the ED 04/2018 with chest pain.  She reported L shoulder and chest wall pain.  EKG revealed diffuse T wave inversions.  Cardiac enzymes were negative x3.  She was referred for outpatient stress testing and had a Lexiscan Myoview 05/07/2018 that revealed LVEF 68%.  There was no ischemia.  She previously had an echo 01/2018 that revealed LVEF 60 to 65% with grade 2 diastolic dysfunction.    [name] had an episode of near syncope 03/2019.  Since then metoprolol was stopped.  She saw Dr. Loletha Grayer in and had an implantable loop monitor placed 12/2019.  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  She denies any recurrent syncope since that time. She had another fall 08/2020 and didn't know what happened.  There were no associated events on her ILR.  She had another fall 02/2021.  She was walking into the bathroom and turned her head.  When she turned her head back and when she started talking the fell.  There was no preceeding lightheadedness or dizziness.  She had no chest pain or shortness  of breath and did not lose consciousness.  She had surgery on her right hip 03/2021.  Since then she has had some swelling in her right leg.  She has been doing physical therapy at home and is still recovering but doing much better.  She is able to walk with just a cane.  She has no chest pain other than some occasional discomfort from the site of her loop recorder.  Her breathing has been stable.  At home her blood pressure has been around 110 over 60s.  She notes that it is  always higher in the office and thinks this is attributable to whitecoat hypertension.   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[place]) 07/13/2011   Near syncope 08/30/2019   PONV (postoperative nausea and vomiting)    Stroke (HCC)    lt side weakness    Past Surgical History:  Procedure Laterality Date   ABDOMINAL HYSTERECTOMY     BACK SURGERY     CHOLECYSTECTOMY     COLONOSCOPY     FOOT SURGERY     x2   MASS EXCISION     back   rortator     rotator cuff   TOTAL SHOULDER ARTHROPLASTY Left 08/16/2018   Procedure: LEFT SHOULDER REVERSE TOTAL SHOULDER ARTHROPLASTY AND BICEPS TENOTOMY;  Surgeon: Tania Ade, MD;  Location: [facility];  Service: Orthopedics;  Laterality: Left;     Current Outpatient Medications  Medication Sig Dispense Refill   acetaminophen (TYLENOL) 500 MG tablet Take 1,000 mg by mouth every 6 (six) hours as needed for mild pain or headache.     aspirin 81 MG chewable tablet Chew by mouth daily.  Biotin 1000 MCG tablet Take 1,000 mcg by mouth daily.     cholecalciferol (VITAMIN D) 1000 UNITS tablet Take 1,000 Units by mouth daily.      cycloSPORINE (RESTASIS) 0.05 % ophthalmic emulsion Place 1 drop into both eyes 2 (two) times daily.     fenoprofen (NALFON) 600 MG TABS tablet Take 600 mg by mouth.     furosemide (LASIX) 80 MG tablet TAKE 1 AND 1/2 TABLETS DAILY 135 tablet 3   hydroxychloroquine (PLAQUENIL) 200 MG tablet Take 400 mg by mouth daily.      latanoprost (XALATAN) 0.005 % ophthalmic solution Place 1 drop into both eyes at bedtime.     losartan (COZAAR) 100 MG tablet Take 100 mg by mouth daily.     montelukast (SINGULAIR) 10 MG tablet Take 10 mg by mouth daily.     omega-3 acid ethyl esters (LOVAZA) 1 g capsule Take by mouth  2 (two) times daily.     pantoprazole (PROTONIX) 40 MG tablet Take 40 mg by mouth daily.     potassium chloride SA (K-DUR,KLOR-CON) 20 MEQ tablet Take 2 tablets (40 mEq total) by mouth 2 (two) times daily.     Saccharomyces boulardii (PROBIOTIC) 250 MG CAPS Take 1 capsule by mouth daily.     SYMBICORT 160-4.5 MCG/ACT inhaler SMARTSIG:2 Puff(s) By Mouth Twice Daily     Turmeric 500 MG CAPS Take by mouth.     rosuvastatin (CRESTOR) 20 MG tablet Take 1 tablet (20 mg total) by mouth daily. 90 tablet 3   Current Facility-Administered Medications  Medication Dose Route Frequency Provider Last Rate Last Admin   lidocaine-EPINEPHrine (XYLOCAINE W/EPI) 1 %-1:100000 (with pres) injection 10 mL  10 mL Infiltration Once Croitoru, Mihai, MD        Allergies:   Iodinated diagnostic agents, Ciprofloxacin, E-mycin [erythromycin base], and Oxycodone    Social History:  The patient  reports that she quit smoking about 26 years ago. She has a 10.00 pack-year smoking history. She has never used smokeless tobacco. She reports that she does not currently use alcohol. She reports that she does not use drugs.   Family History:  The patient's family history includes Coronary artery disease in her brother; Lung disease in her father.    ROS:   Please see the history of present illness. (+) Falling (+) Back stiffness (+) Shortness of breath All other systems are reviewed and negative.     PHYSICAL EXAM: VS:  BP 138/74 (BP Location: Left Arm, Patient Position: Sitting, Cuff Size: Large)   Pulse 65   Ht 5\' 6"  (1.676 m)   Wt 208 lb (94.3 kg)   BMI 33.57 kg/m  , BMI Body mass index is 33.57 kg/m. GENERAL:  Well appearing HEENT: Pupils equal round and reactive, fundi not visualized, oral mucosa unremarkable NECK:  No jugular venous distention, waveform within normal limits, carotid upstroke brisk and symmetric, no bruits.  R clavicular head enlarged LUNGS:  Clear to auscultation bilaterally HEART:  RRR.   PMI not displaced or sustained,S1 and S2 within normal limits, no S3, no S4, no clicks, no rubs, no murmurs ABD:  Flat, positive bowel sounds normal in frequency in pitch, no bruits, no rebound, no guarding, no midline pulsatile mass, no hepatomegaly, no splenomegaly EXT:  2 plus pulses throughout, left ankle edema, no cyanosis no clubbing SKIN:  No rashes no nodules NEURO:  Cranial nerves II through XII grossly intact, motor grossly intact throughout PSYCH:  Cognitively intact, oriented to person place  and time   EKG:   04/08/2020: EKG was not ordered today. 10/29/2020: NSR. Rate 62 bpm. Lateral T wave inversions.     Lexiscan Myoview 05/07/18: Nuclear stress EF: 68%. There was no ST segment deviation noted during stress. No T wave inversion was noted during stress. The study is normal. This is a low risk study. The left ventricular ejection fraction is hyperdynamic (>65%).   Echo 01/29/18: Study Conclusions   - Left ventricle: The cavity size was normal. Wall thickness was   normal. Systolic function was normal. The estimated ejection   fraction was in the range of 60% to 65%. Wall motion was normal;   there were no regional wall motion abnormalities. Features are   consistent with a pseudonormal left ventricular filling pattern,   with concomitant abnormal relaxation and increased filling   pressure (grade 2 diastolic dysfunction). - Mitral valve: Calcified annulus. There was mild regurgitation. - Pericardium, extracardiac: A trivial pericardial effusion was   identified.   Impressions:   - Normal LV systolic function; moderate diastolic dysfunction;   mild MR.  Recent Labs: No results found for requested labs within last 8760 hours.    Lipid Panel    Component Value Date/Time   CHOL 156 11/19/2019 1032   TRIG 56 11/19/2019 1032   HDL 81 11/19/2019 1032   CHOLHDL 1.9 11/19/2019 1032   LDLCALC 63 11/19/2019 1032     09/05/17: Total cholesterol 212, triglycerides  50, HDL 76, LDL 126   04/27/2021: Total cholesterol 163, triglycerides 67, HDL 78, LDL 172 Sodium 145, potassium 4.3, BUN 18, creatinine 0.97 AST 18, ALT 14  02/22/2021: Hemoglobin A1c 5.7%  Wt Readings from Last 3 Encounters:  05/13/21 208 lb (94.3 kg)  10/29/20 200 lb 3.2 oz (90.8 kg)  04/08/20 199 lb (90.3 kg)      ASSESSMENT AND PLAN: Essential hypertension Blood pressure is elevated in the office today but has been generally well controlled at home.  She attributes this to whitecoat hypertension.  Continue losartan.  Chronic diastolic heart failure ([place]) She is euvolemic and doing well.  She only has swelling in the right leg, which is the leg where she had her hip surgery.  Blood pressure control with losartan.  Continue Lasix.  Near syncope She has an ILR.  She has had a fall fairly recently but it does not seem to be related to a cardiac issue.  There were no arrhythmias on her loop recorder.  Given her falls/syncope, we will not push any harder with her blood pressure control for now.    Current medicines are reviewed at length with the patient today.  The patient does not have concerns regarding medicines.  The following changes have been made:  no change  Labs/ tests ordered today include:   Orders Placed This Encounter  Procedures   EKG 12-Lead      Disposition:   FU with Damari Suastegui C. Oval Linsey, MD, [facility] in 6 months     Signed, Vollie Aaron C. Oval Linsey, MD, [facility]  05/13/2021 2:56 PM    [place]

## 2021-05-13 NOTE — Assessment & Plan Note (Signed)
She has an ILR.  She has had a fall fairly recently but it does not seem to be related to a cardiac issue.  There were no arrhythmias on her loop recorder.  Given her falls/syncope, we will not push any harder with her blood pressure control for now.

## 2021-05-14 ENCOUNTER — Encounter (HOSPITAL_BASED_OUTPATIENT_CLINIC_OR_DEPARTMENT_OTHER): Payer: Self-pay

## 2021-05-31 ENCOUNTER — Ambulatory Visit (INDEPENDENT_AMBULATORY_CARE_PROVIDER_SITE_OTHER): Payer: Medicare Other

## 2021-05-31 DIAGNOSIS — R55 Syncope and collapse: Secondary | ICD-10-CM | POA: Diagnosis not present

## 2021-05-31 LAB — CUP PACEART REMOTE DEVICE CHECK
Date Time Interrogation Session: 20221128094958
Implantable Pulse Generator Implant Date: 20210614

## 2021-06-08 NOTE — Progress Notes (Signed)
Carelink Summary Report / Loop Recorder 

## 2021-06-23 ENCOUNTER — Ambulatory Visit (INDEPENDENT_AMBULATORY_CARE_PROVIDER_SITE_OTHER): Payer: Medicare Other

## 2021-06-23 DIAGNOSIS — R55 Syncope and collapse: Secondary | ICD-10-CM

## 2021-06-23 LAB — CUP PACEART REMOTE DEVICE CHECK
Date Time Interrogation Session: 20221221001553
Implantable Pulse Generator Implant Date: 20210614

## 2021-07-02 DIAGNOSIS — E782 Mixed hyperlipidemia: Secondary | ICD-10-CM | POA: Diagnosis not present

## 2021-07-02 DIAGNOSIS — M150 Primary generalized (osteo)arthritis: Secondary | ICD-10-CM | POA: Diagnosis not present

## 2021-07-02 DIAGNOSIS — I110 Hypertensive heart disease with heart failure: Secondary | ICD-10-CM | POA: Diagnosis not present

## 2021-07-02 DIAGNOSIS — I5032 Chronic diastolic (congestive) heart failure: Secondary | ICD-10-CM | POA: Diagnosis not present

## 2021-07-02 NOTE — Progress Notes (Signed)
Carelink Summary Report / Loop Recorder 

## 2021-07-04 HISTORY — PX: JOINT REPLACEMENT: SHX530

## 2021-07-08 DIAGNOSIS — M19071 Primary osteoarthritis, right ankle and foot: Secondary | ICD-10-CM | POA: Diagnosis not present

## 2021-07-08 DIAGNOSIS — M1712 Unilateral primary osteoarthritis, left knee: Secondary | ICD-10-CM | POA: Diagnosis not present

## 2021-07-08 DIAGNOSIS — M25571 Pain in right ankle and joints of right foot: Secondary | ICD-10-CM | POA: Diagnosis not present

## 2021-07-08 DIAGNOSIS — M65871 Other synovitis and tenosynovitis, right ankle and foot: Secondary | ICD-10-CM | POA: Diagnosis not present

## 2021-07-08 DIAGNOSIS — M1711 Unilateral primary osteoarthritis, right knee: Secondary | ICD-10-CM | POA: Diagnosis not present

## 2021-07-20 DIAGNOSIS — R0602 Shortness of breath: Secondary | ICD-10-CM | POA: Diagnosis not present

## 2021-07-20 DIAGNOSIS — Z79899 Other long term (current) drug therapy: Secondary | ICD-10-CM | POA: Diagnosis not present

## 2021-07-20 DIAGNOSIS — M359 Systemic involvement of connective tissue, unspecified: Secondary | ICD-10-CM | POA: Diagnosis not present

## 2021-07-20 DIAGNOSIS — M3501 Sicca syndrome with keratoconjunctivitis: Secondary | ICD-10-CM | POA: Diagnosis not present

## 2021-07-20 DIAGNOSIS — M064 Inflammatory polyarthropathy: Secondary | ICD-10-CM | POA: Diagnosis not present

## 2021-07-20 DIAGNOSIS — M25551 Pain in right hip: Secondary | ICD-10-CM | POA: Diagnosis not present

## 2021-07-22 DIAGNOSIS — M65871 Other synovitis and tenosynovitis, right ankle and foot: Secondary | ICD-10-CM | POA: Diagnosis not present

## 2021-07-22 DIAGNOSIS — M25571 Pain in right ankle and joints of right foot: Secondary | ICD-10-CM | POA: Diagnosis not present

## 2021-07-26 ENCOUNTER — Ambulatory Visit (INDEPENDENT_AMBULATORY_CARE_PROVIDER_SITE_OTHER): Payer: Medicare Other

## 2021-07-26 DIAGNOSIS — R55 Syncope and collapse: Secondary | ICD-10-CM

## 2021-07-26 LAB — CUP PACEART REMOTE DEVICE CHECK
Date Time Interrogation Session: 20230123004227
Implantable Pulse Generator Implant Date: 20210614

## 2021-07-28 DIAGNOSIS — H43813 Vitreous degeneration, bilateral: Secondary | ICD-10-CM | POA: Diagnosis not present

## 2021-07-28 DIAGNOSIS — H40033 Anatomical narrow angle, bilateral: Secondary | ICD-10-CM | POA: Diagnosis not present

## 2021-07-28 DIAGNOSIS — H04123 Dry eye syndrome of bilateral lacrimal glands: Secondary | ICD-10-CM | POA: Diagnosis not present

## 2021-08-05 DIAGNOSIS — M25572 Pain in left ankle and joints of left foot: Secondary | ICD-10-CM | POA: Diagnosis not present

## 2021-08-05 DIAGNOSIS — M65872 Other synovitis and tenosynovitis, left ankle and foot: Secondary | ICD-10-CM | POA: Diagnosis not present

## 2021-08-05 DIAGNOSIS — M65871 Other synovitis and tenosynovitis, right ankle and foot: Secondary | ICD-10-CM | POA: Diagnosis not present

## 2021-08-06 DIAGNOSIS — M064 Inflammatory polyarthropathy: Secondary | ICD-10-CM | POA: Diagnosis not present

## 2021-08-06 NOTE — Progress Notes (Signed)
Carelink Summary Report / Loop Recorder 

## 2021-08-12 DIAGNOSIS — M1612 Unilateral primary osteoarthritis, left hip: Secondary | ICD-10-CM | POA: Diagnosis not present

## 2021-08-20 DIAGNOSIS — M25572 Pain in left ankle and joints of left foot: Secondary | ICD-10-CM | POA: Diagnosis not present

## 2021-08-20 DIAGNOSIS — L303 Infective dermatitis: Secondary | ICD-10-CM | POA: Diagnosis not present

## 2021-08-20 DIAGNOSIS — M65872 Other synovitis and tenosynovitis, left ankle and foot: Secondary | ICD-10-CM | POA: Diagnosis not present

## 2021-08-24 ENCOUNTER — Ambulatory Visit (INDEPENDENT_AMBULATORY_CARE_PROVIDER_SITE_OTHER): Payer: Medicare Other

## 2021-08-24 ENCOUNTER — Encounter: Payer: Self-pay | Admitting: Pulmonary Disease

## 2021-08-24 ENCOUNTER — Ambulatory Visit: Payer: Medicare Other | Admitting: Pulmonary Disease

## 2021-08-24 ENCOUNTER — Other Ambulatory Visit: Payer: Self-pay

## 2021-08-24 VITALS — BP 142/74 | HR 79 | Temp 97.7°F | Ht 65.0 in | Wt 211.4 lb

## 2021-08-24 DIAGNOSIS — R0609 Other forms of dyspnea: Secondary | ICD-10-CM

## 2021-08-24 DIAGNOSIS — R0602 Shortness of breath: Secondary | ICD-10-CM | POA: Diagnosis not present

## 2021-08-24 DIAGNOSIS — R059 Cough, unspecified: Secondary | ICD-10-CM | POA: Diagnosis not present

## 2021-08-24 DIAGNOSIS — I517 Cardiomegaly: Secondary | ICD-10-CM | POA: Diagnosis not present

## 2021-08-24 NOTE — Progress Notes (Signed)
Subjective:    Patient ID: [name], female    DOB: 06/03/1951, 71 y.o.   MRN: [id number]  HPI  71 year old remote smoker presents for evaluation of shortness of breath.  PMH -hypertension, HFpEF Lymphoma 2008 SLE on Plaquenil ( sayed )  Syncope, has a loop recorder  She smoked less than 10 pack years starting in her 12s and quit in 1995. She reports a diagnosis of asthma many years ago Dr. Velora Heckler gave her albuterol, triggers being odors, seasonal allergies.  PCP gave her Symbicort in July 2022. In the last month she reports 2 episodes of shortness of breath of sudden onset, with activity, relieved by taking Symbicort.  In the past she has used albuterol with some benefit.  Symbicort caused mild hoarseness but seem to alleviate her problems.  She is compliant with Lasix and her weight fluctuates between 205 and 210 pounds, reports occasional bipedal edema but denies orthopnea or paroxysmal nocturnal dyspnea. 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[place]) 07/13/2011   Near syncope 08/30/2019   PONV (postoperative nausea and vomiting)    Stroke (HCC)    lt side weakness   Past Surgical History:  Procedure Laterality Date   ABDOMINAL HYSTERECTOMY     BACK SURGERY     CHOLECYSTECTOMY     COLONOSCOPY     FOOT SURGERY     x2   MASS EXCISION     back   rortator     rotator cuff   TOTAL SHOULDER ARTHROPLASTY Left 08/16/2018   Procedure: LEFT SHOULDER REVERSE TOTAL SHOULDER ARTHROPLASTY AND BICEPS TENOTOMY;  Surgeon: Tania Ade, MD;  Location: [facility];  Service:  Orthopedics;  Laterality: Left;    Allergies  Allergen Reactions   Iodinated Contrast Media Other (See Comments)    Kidney failure.    Ciprofloxacin Itching   E-Mycin [Erythromycin Base] Nausea And Vomiting   Oxycodone Other (See Comments)    Hallucination.    Social History   Socioeconomic History   Marital status: Married    Spouse name: Not on file   Number of children: Not on file   Years of education: Not on file   Highest education level: Not on file  Occupational History   Not on file  Tobacco Use   Smoking status: Former    Packs/day: 1.00    Years: 10.00    Pack years: 10.00    Types: Cigarettes    Quit date: 06/17/1994    Years since quitting: 27.2   Smokeless tobacco: Never  Vaping Use   Vaping Use: Never used  Substance and Sexual Activity   Alcohol use: Not Currently    Alcohol/week: 0.0 standard drinks   Drug use: No   Sexual activity: Not Currently  Other Topics Concern   Not on file  Social History Narrative   Not on file   Social Determinants of Health   Financial Resource Strain: Not on file  Food Insecurity: Not on file  Transportation Needs: Not on file  Physical Activity: Not on file  Stress: Not on file  Social Connections: Not on file  Intimate Partner Violence: Not on file    Family History  Problem Relation Age of Onset   Lung disease Father    Coronary artery disease Brother      Review of Systems Constitutional: negative for anorexia, fevers and sweats  Eyes: negative for irritation, redness and visual disturbance  Ears, nose, mouth, throat, and face: negative for earaches, epistaxis, nasal congestion and sore throat  Respiratory: negative for cough,  sputum and wheezing  Cardiovascular: negative for chest pain, orthopnea, palpitations and syncope  Gastrointestinal: negative for abdominal pain, constipation, diarrhea, melena, nausea and vomiting  Genitourinary:negative for dysuria, frequency and hematuria   Hematologic/lymphatic: negative for bleeding, easy bruising and lymphadenopathy  Musculoskeletal:negative for arthralgias, muscle weakness  Neurological: negative for coordination problems, gait problems, headaches and weakness  Endocrine: negative for diabetic symptoms including polydipsia, polyuria and weight loss     Objective:   Physical Exam  Gen. Pleasant, obese, in no distress, normal affect ENT - no pallor,icterus, no post nasal drip, class 2-3 airway Neck: No JVD, no thyromegaly, no carotid bruits Lungs: no use of accessory muscles, no dullness to percussion, decreased without rales or rhonchi  Cardiovascular: Rhythm regular, heart sounds  normal, no murmurs or gallops, no peripheral edema Abdomen: soft and non-tender, no hepatosplenomegaly, BS normal. Musculoskeletal: No deformities, no cyanosis or clubbing Neuro:  alert, non focal, no tremors       Assessment & Plan:

## 2021-08-24 NOTE — Assessment & Plan Note (Signed)
Unclear etiology today, relief with Symbicort seems to suggest asthma as a possible etiology but sudden onset and lack of wheezing points otherwise.  No obvious fluid retention on exam. We will obtain blood work including BNP and RAST given her history of seasonal allergies as trigger I have asked her to continue using Symbicort on an as-needed basis. We will also assess spirometry pre and post Obtain chest x-ray to rule out other etiologies such as ILD, doubt based on exam

## 2021-08-24 NOTE — Patient Instructions (Signed)
X CXR today X Blood work  - BMET / BNP  X schedule spirometry -pre/post

## 2021-08-25 LAB — RESPIRATORY ALLERGY PROFILE REGION II ~~LOC~~

## 2021-08-25 LAB — BASIC METABOLIC PANEL
BUN: 18 mg/dL (ref 6–23)
CO2: 31 mEq/L (ref 19–32)
Calcium: 9 mg/dL (ref 8.4–10.5)
Chloride: 104 mEq/L (ref 96–112)
Creatinine, Ser: 0.91 mg/dL (ref 0.40–1.20)
GFR: 62.91 mL/min (ref >60.00)
Glucose, Bld: 67 mg/dL — ABNORMAL LOW (ref 70–99)
Potassium: 3 mEq/L — ABNORMAL LOW (ref 3.5–5.1)
Sodium: 142 mEq/L (ref 135–145)

## 2021-08-25 LAB — INTERPRETATION:

## 2021-08-25 LAB — BRAIN NATRIURETIC PEPTIDE: Pro B Natriuretic peptide (BNP): 213 pg/mL — ABNORMAL HIGH (ref 0.0–100.0)

## 2021-08-26 ENCOUNTER — Telehealth: Payer: Self-pay | Admitting: Cardiovascular Disease

## 2021-08-26 NOTE — Telephone Encounter (Signed)
° ° °  Pre-operative Risk Assessment    Patient Name: [name]  DOB: [birth year] MRN: [id number]      Request for Surgical Clearance    Procedure:   left hip replacement  Date of Surgery:  Clearance 09/24/21                                 Surgeon:  Dr. Betti Cruz Group or Practice Name:  [facility] Phone number:  [phone] Fax number:  [phone]   Type of Clearance Requested:   - Medical  - Pharmacy:  Hold will defer to cards      Type of Anesthesia:  Spinal   Additional requests/questions:    Signed, Selinda Orion   08/26/2021, 3:27 PM

## 2021-08-27 NOTE — Telephone Encounter (Signed)
° °  Patient Name: [name]  DOB: Nov 04, 1950 MRN: [id number]  Primary Cardiologist: Skeet Latch, MD  Chart reviewed as part of pre-operative protocol coverage.  Patient contacted on 08/27/2021 as part of preop operative screening process.  No answer.  Left message for patient to call back (DPR on file).   Lenna Sciara, NP 08/27/2021, 9:14 AM

## 2021-08-27 NOTE — Telephone Encounter (Signed)
° °  Name: [name]  DOB: 14-Jul-1950  MRN: [id number]   Primary Cardiologist: Skeet Latch, MD  Chart reviewed as part of pre-operative protocol coverage. Patient was contacted 08/27/2021 in reference to pre-operative risk assessment for pending surgery as outlined below.  [name] was last seen on 05/13/2021 by Dr.. Skeet Latch.  Since that day, [name] has done from a cardiac standpoint. She denies any new or concerning symptoms.  According to the Revised Cardiac Risk Index (RCRI), her perioperative risk of major cardiac event is 0.4%.Her METS are 5.07 according to the Duke Activity Status Index (DASI).Therefore, based on ACC/AHA guidelines, the patient would be at acceptable risk for the planned procedure without further cardiovascular testing.   The patient was advised that if she develops new symptoms prior to surgery to contact our office to arrange for a follow-up visit, and she verbalized understanding.  Patient takes aspirin 81 mg daily. Ideally, patient will continue aspirin 81 mg daily through the perioperative period.  However, if aspirin needs to be held prior to surgery, please contact our office.  I will route this recommendation to the requesting party via Epic fax function and remove from pre-op pool. Please call with questions.  Lenna Sciara, NP 08/27/2021, 9:50 AM

## 2021-08-29 LAB — CUP PACEART REMOTE DEVICE CHECK
Date Time Interrogation Session: 20230225004238
Implantable Pulse Generator Implant Date: 20210614

## 2021-08-30 ENCOUNTER — Telehealth: Payer: Self-pay | Admitting: Pulmonary Disease

## 2021-08-30 ENCOUNTER — Ambulatory Visit (INDEPENDENT_AMBULATORY_CARE_PROVIDER_SITE_OTHER): Payer: Medicare Other

## 2021-08-30 DIAGNOSIS — R55 Syncope and collapse: Secondary | ICD-10-CM

## 2021-08-30 NOTE — Telephone Encounter (Signed)
Fax received from Dr. Frederik Pear to perform a Left anterior hip arthroplasty under spinal anesthesia on patient.  Patient needs surgery clearance. Patient was seen on 08/24/2021 by Dr. Elsworth Soho. Office protocol is a risk assessment can be sent to surgeon if patient has been seen in 60 days or less.   Sending to Dr. Elsworth Soho for risk assessment or recommendations if patient needs to be seen in office prior to surgical procedure.

## 2021-08-30 NOTE — Telephone Encounter (Addendum)
Called and spoke with patient to get her scheduled for spiro pre and post. She has been scheduled for Friday.    Rigoberto Noel, MD  You; Lbpu Procedure 1 hour ago (4:01 PM)   Await spirometry - please schedule ASAP  We can then provide clearance

## 2021-08-31 DIAGNOSIS — I5032 Chronic diastolic (congestive) heart failure: Secondary | ICD-10-CM | POA: Diagnosis not present

## 2021-08-31 DIAGNOSIS — E782 Mixed hyperlipidemia: Secondary | ICD-10-CM | POA: Diagnosis not present

## 2021-08-31 DIAGNOSIS — I110 Hypertensive heart disease with heart failure: Secondary | ICD-10-CM | POA: Diagnosis not present

## 2021-08-31 DIAGNOSIS — M150 Primary generalized (osteo)arthritis: Secondary | ICD-10-CM | POA: Diagnosis not present

## 2021-09-01 DIAGNOSIS — Z01818 Encounter for other preprocedural examination: Secondary | ICD-10-CM | POA: Diagnosis not present

## 2021-09-01 DIAGNOSIS — R7303 Prediabetes: Secondary | ICD-10-CM | POA: Diagnosis not present

## 2021-09-03 ENCOUNTER — Other Ambulatory Visit: Payer: Self-pay

## 2021-09-03 ENCOUNTER — Ambulatory Visit (INDEPENDENT_AMBULATORY_CARE_PROVIDER_SITE_OTHER): Payer: Medicare Other | Admitting: Pulmonary Disease

## 2021-09-03 DIAGNOSIS — R0602 Shortness of breath: Secondary | ICD-10-CM | POA: Diagnosis not present

## 2021-09-03 LAB — PULMONARY FUNCTION TEST
FEF 25-75 Post: 2.2 L/sec
FEF 25-75 Pre: 1.29 L/sec
FEF2575-%Change-Post: 69 %
FEF2575-%Pred-Post: 124 %
FEF2575-%Pred-Pre: 73 %
FEV1-%Change-Post: 17 %
FEV1-%Pred-Post: 72 %
FEV1-%Pred-Pre: 61 %
FEV1-Post: 1.42 L
FEV1-Pre: 1.22 L
FEV1FVC-%Change-Post: 4 %
FEV1FVC-%Pred-Pre: 107 %
FEV6-%Change-Post: 12 %
FEV6-%Pred-Post: 67 %
FEV6-%Pred-Pre: 60 %
FEV6-Post: 1.65 L
FEV6-Pre: 1.47 L
FEV6FVC-%Pred-Post: 103 %
FEV6FVC-%Pred-Pre: 103 %
FVC-%Change-Post: 12 %
FVC-%Pred-Post: 65 %
FVC-%Pred-Pre: 57 %
FVC-Post: 1.65 L
FVC-Pre: 1.47 L
Post FEV1/FVC ratio: 86 %
Post FEV6/FVC ratio: 100 %
Pre FEV1/FVC ratio: 83 %
Pre FEV6/FVC Ratio: 100 %

## 2021-09-03 NOTE — Progress Notes (Signed)
Carelink Summary Report / Loop Recorder 

## 2021-09-03 NOTE — Progress Notes (Signed)
Pre post spirometry completed today  ?

## 2021-09-06 NOTE — Telephone Encounter (Signed)
Spirometry shows moderate restriction with some bronchodilator response. ?Continue on Symbicort. ?She is cleared for left hip surgery with due risk from a pulmonary standpoint ?Please send a copy of this phone message to Dr. Mayer Camel ?

## 2021-09-08 DIAGNOSIS — I10 Essential (primary) hypertension: Secondary | ICD-10-CM | POA: Diagnosis not present

## 2021-09-08 DIAGNOSIS — R7303 Prediabetes: Secondary | ICD-10-CM | POA: Diagnosis not present

## 2021-09-08 DIAGNOSIS — Z01818 Encounter for other preprocedural examination: Secondary | ICD-10-CM | POA: Diagnosis not present

## 2021-09-08 DIAGNOSIS — M359 Systemic involvement of connective tissue, unspecified: Secondary | ICD-10-CM | POA: Diagnosis not present

## 2021-09-09 NOTE — Telephone Encounter (Signed)
OV notes and clearance form have been faxed to [facility]. Nothing further needed ?

## 2021-09-10 ENCOUNTER — Telehealth: Payer: Self-pay | Admitting: Pulmonary Disease

## 2021-09-10 NOTE — Telephone Encounter (Signed)
Called and spoke with patient. She stated that she had a PFT on 09/03/21 and had not heard anything in regards to the results. She wanted to make sure that she could still have surgery as planned later this month.  ? ?I advised her that it looks like the clearance was approved and sent to [facility] late yesterday. She was also made aware of her results and verbalized understanding.  ? ?Nothing further needed at time of call.  ?

## 2021-09-14 DIAGNOSIS — H43813 Vitreous degeneration, bilateral: Secondary | ICD-10-CM | POA: Diagnosis not present

## 2021-09-14 DIAGNOSIS — H04123 Dry eye syndrome of bilateral lacrimal glands: Secondary | ICD-10-CM | POA: Diagnosis not present

## 2021-09-15 DIAGNOSIS — M1612 Unilateral primary osteoarthritis, left hip: Secondary | ICD-10-CM | POA: Diagnosis not present

## 2021-09-17 DIAGNOSIS — M1612 Unilateral primary osteoarthritis, left hip: Secondary | ICD-10-CM | POA: Diagnosis not present

## 2021-09-21 ENCOUNTER — Institutional Professional Consult (permissible substitution): Payer: Medicare Other | Admitting: Pulmonary Disease

## 2021-09-24 DIAGNOSIS — Z96642 Presence of left artificial hip joint: Secondary | ICD-10-CM | POA: Diagnosis not present

## 2021-09-24 DIAGNOSIS — M25752 Osteophyte, left hip: Secondary | ICD-10-CM | POA: Diagnosis not present

## 2021-09-24 DIAGNOSIS — M1612 Unilateral primary osteoarthritis, left hip: Secondary | ICD-10-CM | POA: Diagnosis not present

## 2021-09-27 DIAGNOSIS — Z96642 Presence of left artificial hip joint: Secondary | ICD-10-CM | POA: Diagnosis not present

## 2021-09-27 DIAGNOSIS — M1612 Unilateral primary osteoarthritis, left hip: Secondary | ICD-10-CM | POA: Diagnosis not present

## 2021-09-28 DIAGNOSIS — Z96642 Presence of left artificial hip joint: Secondary | ICD-10-CM | POA: Diagnosis not present

## 2021-09-28 DIAGNOSIS — M1612 Unilateral primary osteoarthritis, left hip: Secondary | ICD-10-CM | POA: Diagnosis not present

## 2021-10-01 ENCOUNTER — Telehealth: Payer: Self-pay | Admitting: Cardiovascular Disease

## 2021-10-01 ENCOUNTER — Other Ambulatory Visit: Payer: Self-pay | Admitting: Cardiovascular Disease

## 2021-10-01 DIAGNOSIS — I5032 Chronic diastolic (congestive) heart failure: Secondary | ICD-10-CM | POA: Diagnosis not present

## 2021-10-01 DIAGNOSIS — I110 Hypertensive heart disease with heart failure: Secondary | ICD-10-CM | POA: Diagnosis not present

## 2021-10-01 DIAGNOSIS — M150 Primary generalized (osteo)arthritis: Secondary | ICD-10-CM | POA: Diagnosis not present

## 2021-10-01 DIAGNOSIS — E782 Mixed hyperlipidemia: Secondary | ICD-10-CM | POA: Diagnosis not present

## 2021-10-01 NOTE — Telephone Encounter (Signed)
Rx(s) sent to pharmacy electronically.  

## 2021-10-01 NOTE — Telephone Encounter (Signed)
?*  STAT* If patient is at the pharmacy, call can be transferred to refill team. ? ? ?1. Which medications need to be refilled? (please list name of each medication and dose if known) rosuvastatin (CRESTOR) 20 MG tablet ? ?2. Which pharmacy/location (including street and city if local pharmacy) is medication to be sent to? [facility] [place] ? ?3. Do they need a 30 day or 90 day supply? 90 day ? ? ?Patient has about 6 tablets left. ?

## 2021-10-01 NOTE — Telephone Encounter (Signed)
Medication sent to pharmacy  

## 2021-10-04 ENCOUNTER — Ambulatory Visit (INDEPENDENT_AMBULATORY_CARE_PROVIDER_SITE_OTHER): Payer: Medicare Other

## 2021-10-04 DIAGNOSIS — R55 Syncope and collapse: Secondary | ICD-10-CM | POA: Diagnosis not present

## 2021-10-04 LAB — CUP PACEART REMOTE DEVICE CHECK
Date Time Interrogation Session: 20230401001720
Implantable Pulse Generator Implant Date: 20210614

## 2021-10-05 DIAGNOSIS — M1612 Unilateral primary osteoarthritis, left hip: Secondary | ICD-10-CM | POA: Diagnosis not present

## 2021-10-05 DIAGNOSIS — Z9889 Other specified postprocedural states: Secondary | ICD-10-CM | POA: Diagnosis not present

## 2021-10-05 DIAGNOSIS — Z96642 Presence of left artificial hip joint: Secondary | ICD-10-CM | POA: Diagnosis not present

## 2021-10-06 ENCOUNTER — Ambulatory Visit: Payer: Medicare Other | Admitting: Pulmonary Disease

## 2021-10-07 DIAGNOSIS — Z96642 Presence of left artificial hip joint: Secondary | ICD-10-CM | POA: Diagnosis not present

## 2021-10-07 DIAGNOSIS — M1612 Unilateral primary osteoarthritis, left hip: Secondary | ICD-10-CM | POA: Diagnosis not present

## 2021-10-12 DIAGNOSIS — M1612 Unilateral primary osteoarthritis, left hip: Secondary | ICD-10-CM | POA: Diagnosis not present

## 2021-10-12 DIAGNOSIS — Z96642 Presence of left artificial hip joint: Secondary | ICD-10-CM | POA: Diagnosis not present

## 2021-10-14 DIAGNOSIS — M1612 Unilateral primary osteoarthritis, left hip: Secondary | ICD-10-CM | POA: Diagnosis not present

## 2021-10-14 DIAGNOSIS — Z96642 Presence of left artificial hip joint: Secondary | ICD-10-CM | POA: Diagnosis not present

## 2021-10-15 NOTE — Progress Notes (Signed)
Carelink Summary Report / Loop Recorder 

## 2021-10-19 DIAGNOSIS — M1612 Unilateral primary osteoarthritis, left hip: Secondary | ICD-10-CM | POA: Diagnosis not present

## 2021-10-19 DIAGNOSIS — Z96642 Presence of left artificial hip joint: Secondary | ICD-10-CM | POA: Diagnosis not present

## 2021-10-21 DIAGNOSIS — M1612 Unilateral primary osteoarthritis, left hip: Secondary | ICD-10-CM | POA: Diagnosis not present

## 2021-10-21 DIAGNOSIS — Z96642 Presence of left artificial hip joint: Secondary | ICD-10-CM | POA: Diagnosis not present

## 2021-10-26 DIAGNOSIS — M1612 Unilateral primary osteoarthritis, left hip: Secondary | ICD-10-CM | POA: Diagnosis not present

## 2021-10-26 DIAGNOSIS — M1711 Unilateral primary osteoarthritis, right knee: Secondary | ICD-10-CM | POA: Diagnosis not present

## 2021-10-26 DIAGNOSIS — M1712 Unilateral primary osteoarthritis, left knee: Secondary | ICD-10-CM | POA: Diagnosis not present

## 2021-10-26 DIAGNOSIS — Z96642 Presence of left artificial hip joint: Secondary | ICD-10-CM | POA: Diagnosis not present

## 2021-10-27 ENCOUNTER — Encounter: Payer: Self-pay | Admitting: Pulmonary Disease

## 2021-10-27 ENCOUNTER — Telehealth: Payer: Self-pay | Admitting: Pulmonary Disease

## 2021-10-27 ENCOUNTER — Ambulatory Visit: Payer: Medicare Other | Admitting: Pulmonary Disease

## 2021-10-27 ENCOUNTER — Other Ambulatory Visit: Payer: Self-pay | Admitting: Cardiovascular Disease

## 2021-10-27 DIAGNOSIS — J8410 Pulmonary fibrosis, unspecified: Secondary | ICD-10-CM

## 2021-10-27 DIAGNOSIS — J4540 Moderate persistent asthma, uncomplicated: Secondary | ICD-10-CM

## 2021-10-27 MED ORDER — ADVAIR HFA 115-21 MCG/ACT IN AERO: 2.0000 | INHALATION_SPRAY | Freq: Two times a day (BID) | RESPIRATORY_TRACT | 12 refills | Status: DC

## 2021-10-27 MED ORDER — AEROCHAMBER MV MISC: 0 refills | Status: DC

## 2021-10-27 MED ORDER — ALBUTEROL SULFATE HFA 108 (90 BASE) MCG/ACT IN AERS: 2.0000 | INHALATION_SPRAY | Freq: Four times a day (QID) | RESPIRATORY_TRACT | 6 refills | Status: DC | PRN

## 2021-10-27 NOTE — Telephone Encounter (Signed)
Called and spoke with patient. She stated that she was seen by Dr. Elsworth Soho earlier and was advised that a RX for Advair and albuterol would be sent to the pharmacy. The albuterol was never sent.  ? ?I advised her that I would go ahead and send in the albuterol for her. She verbalized understanding.  ? ?Nothing further needed at time of call.  ?

## 2021-10-27 NOTE — Patient Instructions (Signed)
?  X switch to advair generic 250 - 1 puff bid, RINSE mouth after use  ? ?Rx for albuterol 2 puffs as needed for wheezing ? ?Try Zyrtec or claritin OTC x 1 week ? ?Rx for  spacer ?

## 2021-10-27 NOTE — Assessment & Plan Note (Signed)
Given reversibility on PFTs reasonable to treat as asthma.  Symbicort is expensive and I will switch to generic Advair. ?Hoarseness may be related to allergies, she will try OTC antihistaminic and see if this improves.  It may also be related to inhaled steroid and we will provide her with a spacer.  I advised her to rinse her mouth after using steroid inhaler ?

## 2021-10-27 NOTE — Telephone Encounter (Signed)
Rx(s) sent to pharmacy electronically.  

## 2021-10-27 NOTE — Progress Notes (Signed)
? ?  Subjective:  ? ? Patient ID: [name], female    DOB: 07/06/1950, 71 y.o.   MRN: [id number] ? ?HPI ? ?71 year old remote smoker for FU of shortness of breath. ?  ?PMH -hypertension, HFpEF ?Lymphoma 2008 ?SLE on Plaquenil ( sayed )  ?Syncope, has a loop recorder ?  ?She smoked less than 10 pack years starting in her 23s and quit in 1995. ?She reports a diagnosis of asthma many years ago Dr. Velora Heckler gave her albuterol, triggers being odors, seasonal allergies.  PCP gave her Symbicort in July 2022. ? ?Initial OV 08/2021  relief with Symbicort seems to suggest asthma as a possible etiology but sudden onset and lack of wheezing points otherwise ?CXR was clear ?BNP 213 ?RAST panel negative, IgE less than 2 ? ?She reports hoarseness for 2 weeks, occasional wheezing in the morning and shortness of breath with minimal mucus production, Symbicort seems to help but is very expensive and would like an alternative ? ?Significant tests/ events reviewed ? ?08/2021 Spirometry shows moderate restriction with some bronchodilator response.  Ratio 83, FEV1 61%, FVC 57%, 17% improvement in FVC with albuterol ? ?CT abdomen 11/2016 in 2015 shows mild chronic bibasilar pulmonary scarring ? ? ?Review of Systems ?neg for any significant sore throat, dysphagia, itching, sneezing, nasal congestion or excess/ purulent secretions, fever, chills, sweats, unintended wt loss, pleuritic or exertional cp, hempoptysis, orthopnea pnd or change in chronic leg swelling. Also denies presyncope, palpitations, heartburn, abdominal pain, nausea, vomiting, diarrhea or change in bowel or urinary habits, dysuria,hematuria, rash, arthralgias, visual complaints, headache, numbness weakness or ataxia. ? ?   ?Objective:  ? Physical Exam ? ?Gen. Pleasant, obese, in no distress ?ENT - no lesions, no post nasal drip ?Neck: No JVD, no thyromegaly, no carotid bruits ?Lungs: no use of accessory muscles, no dullness to percussion, decreased without rales or rhonchi   ?Cardiovascular: Rhythm regular, heart sounds  normal, no murmurs or gallops, no peripheral edema ?Musculoskeletal: No deformities, no cyanosis or clubbing , no tremors ? ? ? ?   ?Assessment & Plan:  ? ? ?

## 2021-10-27 NOTE — Assessment & Plan Note (Signed)
Appears to be postinflammatory scarring at the bases on previous CT abdomen but not noted on chest x-ray ?

## 2021-10-28 DIAGNOSIS — M1612 Unilateral primary osteoarthritis, left hip: Secondary | ICD-10-CM | POA: Diagnosis not present

## 2021-10-28 DIAGNOSIS — Z96642 Presence of left artificial hip joint: Secondary | ICD-10-CM | POA: Diagnosis not present

## 2021-10-31 DIAGNOSIS — M150 Primary generalized (osteo)arthritis: Secondary | ICD-10-CM | POA: Diagnosis not present

## 2021-10-31 DIAGNOSIS — I110 Hypertensive heart disease with heart failure: Secondary | ICD-10-CM | POA: Diagnosis not present

## 2021-10-31 DIAGNOSIS — I5032 Chronic diastolic (congestive) heart failure: Secondary | ICD-10-CM | POA: Diagnosis not present

## 2021-10-31 DIAGNOSIS — E782 Mixed hyperlipidemia: Secondary | ICD-10-CM | POA: Diagnosis not present

## 2021-11-02 DIAGNOSIS — I5032 Chronic diastolic (congestive) heart failure: Secondary | ICD-10-CM | POA: Diagnosis not present

## 2021-11-02 DIAGNOSIS — E782 Mixed hyperlipidemia: Secondary | ICD-10-CM | POA: Diagnosis not present

## 2021-11-02 DIAGNOSIS — M170 Bilateral primary osteoarthritis of knee: Secondary | ICD-10-CM | POA: Diagnosis not present

## 2021-11-02 DIAGNOSIS — M1712 Unilateral primary osteoarthritis, left knee: Secondary | ICD-10-CM | POA: Diagnosis not present

## 2021-11-02 DIAGNOSIS — I10 Essential (primary) hypertension: Secondary | ICD-10-CM | POA: Diagnosis not present

## 2021-11-02 DIAGNOSIS — Z96642 Presence of left artificial hip joint: Secondary | ICD-10-CM | POA: Diagnosis not present

## 2021-11-02 DIAGNOSIS — R7303 Prediabetes: Secondary | ICD-10-CM | POA: Diagnosis not present

## 2021-11-02 DIAGNOSIS — M1711 Unilateral primary osteoarthritis, right knee: Secondary | ICD-10-CM | POA: Diagnosis not present

## 2021-11-02 DIAGNOSIS — Z0000 Encounter for general adult medical examination without abnormal findings: Secondary | ICD-10-CM | POA: Diagnosis not present

## 2021-11-02 DIAGNOSIS — M1612 Unilateral primary osteoarthritis, left hip: Secondary | ICD-10-CM | POA: Diagnosis not present

## 2021-11-04 DIAGNOSIS — M1612 Unilateral primary osteoarthritis, left hip: Secondary | ICD-10-CM | POA: Diagnosis not present

## 2021-11-04 DIAGNOSIS — Z96642 Presence of left artificial hip joint: Secondary | ICD-10-CM | POA: Diagnosis not present

## 2021-11-04 LAB — CUP PACEART REMOTE DEVICE CHECK
Date Time Interrogation Session: 20230504001955
Implantable Pulse Generator Implant Date: 20210614

## 2021-11-08 ENCOUNTER — Ambulatory Visit (INDEPENDENT_AMBULATORY_CARE_PROVIDER_SITE_OTHER): Payer: Medicare Other

## 2021-11-08 DIAGNOSIS — M064 Inflammatory polyarthropathy: Secondary | ICD-10-CM | POA: Diagnosis not present

## 2021-11-08 DIAGNOSIS — R55 Syncope and collapse: Secondary | ICD-10-CM | POA: Diagnosis not present

## 2021-11-08 DIAGNOSIS — M359 Systemic involvement of connective tissue, unspecified: Secondary | ICD-10-CM | POA: Diagnosis not present

## 2021-11-08 DIAGNOSIS — Z79899 Other long term (current) drug therapy: Secondary | ICD-10-CM | POA: Diagnosis not present

## 2021-11-08 DIAGNOSIS — M3501 Sicca syndrome with keratoconjunctivitis: Secondary | ICD-10-CM | POA: Diagnosis not present

## 2021-11-09 DIAGNOSIS — M1711 Unilateral primary osteoarthritis, right knee: Secondary | ICD-10-CM | POA: Diagnosis not present

## 2021-11-09 DIAGNOSIS — Z0000 Encounter for general adult medical examination without abnormal findings: Secondary | ICD-10-CM | POA: Diagnosis not present

## 2021-11-09 DIAGNOSIS — M329 Systemic lupus erythematosus, unspecified: Secondary | ICD-10-CM | POA: Diagnosis not present

## 2021-11-09 DIAGNOSIS — M1712 Unilateral primary osteoarthritis, left knee: Secondary | ICD-10-CM | POA: Diagnosis not present

## 2021-11-09 DIAGNOSIS — M170 Bilateral primary osteoarthritis of knee: Secondary | ICD-10-CM | POA: Diagnosis not present

## 2021-11-09 DIAGNOSIS — C8300 Small cell B-cell lymphoma, unspecified site: Secondary | ICD-10-CM | POA: Diagnosis not present

## 2021-11-09 DIAGNOSIS — M1612 Unilateral primary osteoarthritis, left hip: Secondary | ICD-10-CM | POA: Diagnosis not present

## 2021-11-09 DIAGNOSIS — M3501 Sicca syndrome with keratoconjunctivitis: Secondary | ICD-10-CM | POA: Diagnosis not present

## 2021-11-09 DIAGNOSIS — Z96642 Presence of left artificial hip joint: Secondary | ICD-10-CM | POA: Diagnosis not present

## 2021-11-11 DIAGNOSIS — Z96642 Presence of left artificial hip joint: Secondary | ICD-10-CM | POA: Diagnosis not present

## 2021-11-11 DIAGNOSIS — M1612 Unilateral primary osteoarthritis, left hip: Secondary | ICD-10-CM | POA: Diagnosis not present

## 2021-11-16 DIAGNOSIS — M1612 Unilateral primary osteoarthritis, left hip: Secondary | ICD-10-CM | POA: Diagnosis not present

## 2021-11-16 DIAGNOSIS — Z96642 Presence of left artificial hip joint: Secondary | ICD-10-CM | POA: Diagnosis not present

## 2021-11-18 DIAGNOSIS — M1612 Unilateral primary osteoarthritis, left hip: Secondary | ICD-10-CM | POA: Diagnosis not present

## 2021-11-18 DIAGNOSIS — Z96642 Presence of left artificial hip joint: Secondary | ICD-10-CM | POA: Diagnosis not present

## 2021-11-23 ENCOUNTER — Other Ambulatory Visit: Payer: Self-pay | Admitting: Internal Medicine

## 2021-11-23 DIAGNOSIS — Z96642 Presence of left artificial hip joint: Secondary | ICD-10-CM | POA: Diagnosis not present

## 2021-11-23 DIAGNOSIS — Z1231 Encounter for screening mammogram for malignant neoplasm of breast: Secondary | ICD-10-CM

## 2021-11-23 DIAGNOSIS — M1612 Unilateral primary osteoarthritis, left hip: Secondary | ICD-10-CM | POA: Diagnosis not present

## 2021-11-24 NOTE — Progress Notes (Signed)
Cardiology Office Note  Date:  11/25/2021   ID:  [name], DOB 1950-08-10, MRN [id number]  PCP:  Merrilee Seashore, MD  Cardiologist:   Skeet Latch, MD   No chief complaint on file.    History of Present Illness: [name] is a 71 y.o. female with prior stroke, rheumatoid arthritis, lupus, interstitial lung disease, hypertension, prior lymphoplasmacytoid lymphoma, and asthma here for follow up.  She was initially seen in the ED 04/2018 with chest pain.  She reported L shoulder and chest wall pain.  EKG revealed diffuse T wave inversions.  Cardiac enzymes were negative x3.  She was referred for outpatient stress testing and had a Lexiscan Myoview 05/07/2018 that revealed LVEF 68%.  There was no ischemia.  She previously had an echo 01/2018 that revealed LVEF 60 to 65% with grade 2 diastolic dysfunction.    [name] had an episode of near syncope 03/2019.  Since then metoprolol was stopped.  She saw Dr. Loletha Grayer in and had an implantable loop monitor placed 12/2019.  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  She denied any recurrent syncope since that time. She had another fall 08/2020 and didn't know what happened.  There were no associated events on her ILR.  She had another fall 02/2021.  She was walking into the bathroom and turned her head.  When she turned her head back and when she started talking she fell.  There was no preceding lightheadedness or dizziness.  She had no chest pain or shortness  of breath and did not lose consciousness.  She had surgery on her right hip 03/2021.    At her last appointment she was recovering from her right hip surgery and in PT. She was able to walk with a cane and noted only some swelling in her right leg. She reported well controlled blood pressures at home. She has not had any arrhythmias on her ILR.  She is accompanied by her husband [name], who is also a patient of mine. They both have appointments scheduled today and wish to  be seen together. Today, she reports she has had both of her hip replacements and denies any recurrent falls or loss of consciousness. Her left leg is still a little swollen due to her recent surgery; she reports taking 1.2 tablets of Lasix once daily. Lately her ILR is causing her some pain, and she is interested in having it removed. At home, her blood pressure is sometimes very low. When it is around 94/50s she will feel "real woozy." This occurred a couple of times over the past month. Mostly she has seen readings in the range of 782-956 systolic. She also notes that she requested her BP readings be sent in from [facility], which has been scanned into her record. Upon personal review this shows recent BP averaging in the 110s/60s. In April she had a very low reading of 75/48. Additionally she notes that she has a significant loss of appetite. On some days she forgets to eat anything all day. On other days she may only have peanut butter crackers. She states that she often is not able to decide what she wants to eat, and when she does she will not be able to finish. She denies any palpitations, or shortness of breath. No headaches, orthopnea, or PND.   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/2011   Near syncope 08/30/2019   PONV (postoperative nausea and vomiting)    Stroke (HCC)    lt side weakness    Past Surgical History:  Procedure Laterality Date   ABDOMINAL HYSTERECTOMY     BACK SURGERY     CHOLECYSTECTOMY     COLONOSCOPY     FOOT SURGERY     x2   MASS EXCISION     back   rortator     rotator cuff   TOTAL SHOULDER ARTHROPLASTY Left 08/16/2018   Procedure: LEFT SHOULDER REVERSE TOTAL SHOULDER ARTHROPLASTY AND BICEPS TENOTOMY;  Surgeon:  Tania Ade, MD;  Location: [facility];  Service: Orthopedics;  Laterality: Left;     Current Outpatient Medications  Medication Sig Dispense Refill   acetaminophen (TYLENOL) 500 MG tablet Take 1,000 mg by mouth every 6 (six) hours as needed for mild pain or headache.     albuterol (VENTOLIN HFA) 108 (90 Base) MCG/ACT inhaler Inhale 2 puffs into the lungs every 6 (six) hours as needed for wheezing or shortness of breath. 8 g 6   aspirin 81 MG chewable tablet Chew by mouth daily.     Biotin 1000 MCG tablet Take 1,000 mcg by mouth daily.     celecoxib (CELEBREX) 200 MG capsule Take 200 mg by mouth 2 (two) times daily.     cholecalciferol (VITAMIN D) 1000 UNITS tablet Take 1,000 Units by mouth daily.      fluticasone-salmeterol (ADVAIR HFA) 115-21 MCG/ACT inhaler Inhale 2 puffs into the lungs 2 (two) times daily. 1 each 12   furosemide (LASIX) 80 MG tablet TAKE ONE AND ONE-HALF TABLETS BY MOUTH DAILY. NEW DOSE 135 tablet 1   hydroxychloroquine (PLAQUENIL) 200 MG tablet Take 400 mg by mouth daily.      latanoprost (XALATAN) 0.005 % ophthalmic solution Place 1 drop into both eyes at bedtime.     omega-3 acid ethyl esters (LOVAZA) 1 g capsule Take by mouth 2 (two) times daily.     pantoprazole (PROTONIX) 40 MG tablet Take 40 mg by mouth daily.     potassium chloride SA (K-DUR,KLOR-CON) 20 MEQ tablet Take 2 tablets (40 mEq total) by mouth 2 (two) times daily.     rosuvastatin (CRESTOR) 20 MG tablet TAKE 1 TABLET BY MOUTH DAILY GENERIC EQUIVALENT FOR CRESTOR 90 tablet 1   Saccharomyces boulardii (PROBIOTIC) 250 MG CAPS Take 1 capsule by mouth daily.     Spacer/Aero-Holding Chambers (AEROCHAMBER MV) inhaler Use as instructed 1 each 0   Turmeric 500 MG CAPS Take by mouth.     losartan (COZAAR) 50 MG tablet Take 1 tablet (50 mg total) by mouth daily. 90 tablet 3   Current Facility-Administered Medications  Medication Dose Route Frequency Provider Last Rate Last Admin   lidocaine-EPINEPHrine  (XYLOCAINE W/EPI) 1 %-1:100000 (with pres) injection 10 mL  10 mL Infiltration Once Croitoru, Mihai, MD        Allergies:   Iodinated contrast media, Ciprofloxacin, E-mycin [erythromycin base], and Oxycodone    Social History:  The patient  reports that she quit smoking about 27 years ago. Her smoking use included cigarettes. She has a 10.00 pack-year smoking history. She has never used smokeless tobacco. She reports that she does not currently use alcohol. She reports that she does not use drugs.   Family History:  The patient's  family history includes Coronary artery disease in her brother; Lung disease in her father.   ROS:   Please see the history of present illness. (+) Chest pain due to ILR (+) Lightheadedness (+) Left LE edema (+) Loss of appetite All other systems are reviewed and negative.    PHYSICAL EXAM:  VS:  BP 128/62 (BP Location: Left Arm, Patient Position: Sitting, Cuff Size: Large)   Pulse 69   Ht '5\' 7"'$  (1.702 m)   Wt 215 lb 12.8 oz (97.9 kg)   SpO2 96%   BMI 33.80 kg/m  , BMI Body mass index is 33.8 kg/m. GENERAL:  Well appearing HEENT: Pupils equal round and reactive, fundi not visualized, oral mucosa unremarkable NECK:  No jugular venous distention, waveform within normal limits, carotid upstroke brisk and symmetric, no bruits.  R clavicular head enlarged LUNGS:  Clear to auscultation bilaterally HEART:  RRR.  PMI not displaced or sustained,S1 and S2 within normal limits, no S3, no S4, no clicks, no rubs, no murmurs ABD:  Flat, positive bowel sounds normal in frequency in pitch, no bruits, no rebound, no guarding, no midline pulsatile mass, no hepatomegaly, no splenomegaly EXT:  2 plus pulses throughout, 2+ left ankle edema, no cyanosis no clubbing SKIN:  No rashes no nodules NEURO:  Cranial nerves II through XII grossly intact, motor grossly intact throughout PSYCH:  Cognitively intact, oriented to person place and time   EKG:  EKG is personally  reviewed. 11/25/2021:  EKG was not ordered. 10/29/2020: NSR. Rate 62 bpm. Lateral T wave inversions. 04/08/2020: EKG was not ordered.     Event Monitor 10/2019: 30 Day Event Monitor   Quality: Fair.  Baseline artifact. Predominant rhythm: sinus rhythm Average heart rate: 61 bpm Max heart rate: 106 bpm Min heart rate: 58 bpm   No arrhythmias  Bilateral Carotid Dopplers 08/21/2019: Summary:  Right Carotid: Velocities in the right ICA are consistent with a 1-39%  stenosis.   Left Carotid: Velocities in the left ICA are consistent with a 1-39%  stenosis.   Vertebrals:  Bilateral vertebral arteries demonstrate antegrade flow.  Subclavians: Normal flow hemodynamics were seen in bilateral subclavian arteries.   Lexiscan Myoview 05/07/18: Nuclear stress EF: 68%. There was no ST segment deviation noted during stress. No T wave inversion was noted during stress. The study is normal. This is a low risk study. The left ventricular ejection fraction is hyperdynamic (>65%).   Echo 01/29/18: Study Conclusions   - Left ventricle: The cavity size was normal. Wall thickness was   normal. Systolic function was normal. The estimated ejection   fraction was in the range of 60% to 65%. Wall motion was normal;   there were no regional wall motion abnormalities. Features are   consistent with a pseudonormal left ventricular filling pattern,   with concomitant abnormal relaxation and increased filling   pressure (grade 2 diastolic dysfunction). - Mitral valve: Calcified annulus. There was mild regurgitation. - Pericardium, extracardiac: A trivial pericardial effusion was   identified.   Impressions:   - Normal LV systolic function; moderate diastolic dysfunction;   mild MR.  Recent Labs: 08/24/2021: BUN 18; Creatinine, Ser 0.91; Potassium 3.0; Pro B Natriuretic peptide (BNP) 213.0; Sodium 142    Lipid Panel    Component Value Date/Time   CHOL 156 11/19/2019 1032   TRIG 56 11/19/2019  1032   HDL 81 11/19/2019 1032   CHOLHDL 1.9 11/19/2019 1032   LDLCALC 63 11/19/2019 1032     09/05/17: Total  cholesterol 212, triglycerides 50, HDL 76, LDL 126   04/27/2021: Total cholesterol 163, triglycerides 67, HDL 78, LDL 172 Sodium 145, potassium 4.3, BUN 18, creatinine 0.97 AST 18, ALT 14  02/22/2021: Hemoglobin A1c 5.7%  Wt Readings from Last 3 Encounters:  11/25/21 215 lb 12.8 oz (97.9 kg)  10/27/21 213 lb 12.8 oz (97 kg)  08/24/21 211 lb 6.4 oz (95.9 kg)     ASSESSMENT AND PLAN:  Essential hypertension BP is well-controlled.  Her BP has been very low at times at home and she gets lightheaded.  We will reduce losartan to '50mg'$ .  She will track her BP at home and bring to follow up.  Chronic diastolic heart failure (HCC) Mild LE edema is stable on furosemide.  BP well-controlled as above.   Near syncope No recurrent episodes.  Now that she had her hips replaced she no longer unstable and feeling like she will fall.  In retrospect, she thinks that this feeling was more due to gait instability and near syncope.  She has had her loop implantable loop recorder for years and not had any episodes.  She has some discomfort from it and would like it removed.  We will have her see Dr. Sallyanne Kuster for this.  Carotid stenosis Mild carotid stenosis.  Continue aspirin and rosuvastatin.  Lipids are well controlled    Current medicines are reviewed at length with the patient today.  The patient does not have concerns regarding medicines.  The following changes have been made:  no change  Labs/ tests ordered today include:   Orders Placed This Encounter  Procedures   AMB Referral to [facility] Pharm-D     Disposition:    FU with PharmD in 1 month. FU with Kaitlinn Iversen C. Oval Linsey, MD, [facility] in 6 months.   I,Mathew Stumpf,acting as a Education administrator for Skeet Latch, MD.,have documented all relevant documentation on the behalf of Skeet Latch, MD,as directed by  Skeet Latch, MD  while in the presence of Skeet Latch, MD.  I, [place] Oval Linsey, MD have reviewed all documentation for this visit.  The documentation of the exam, diagnosis, procedures, and orders on 11/25/2021 are all accurate and complete.   Signed, Jerrid Forgette C. Oval Linsey, MD, [facility]  11/25/2021 11:36 AM    [place]

## 2021-11-25 ENCOUNTER — Ambulatory Visit (HOSPITAL_BASED_OUTPATIENT_CLINIC_OR_DEPARTMENT_OTHER): Payer: Medicare Other | Admitting: Cardiovascular Disease

## 2021-11-25 ENCOUNTER — Encounter (HOSPITAL_BASED_OUTPATIENT_CLINIC_OR_DEPARTMENT_OTHER): Payer: Self-pay | Admitting: Cardiovascular Disease

## 2021-11-25 VITALS — BP 128/62 | HR 69 | Ht 67.0 in | Wt 215.8 lb

## 2021-11-25 DIAGNOSIS — I5032 Chronic diastolic (congestive) heart failure: Secondary | ICD-10-CM | POA: Diagnosis not present

## 2021-11-25 DIAGNOSIS — I10 Essential (primary) hypertension: Secondary | ICD-10-CM | POA: Diagnosis not present

## 2021-11-25 DIAGNOSIS — R55 Syncope and collapse: Secondary | ICD-10-CM | POA: Diagnosis not present

## 2021-11-25 DIAGNOSIS — I6523 Occlusion and stenosis of bilateral carotid arteries: Secondary | ICD-10-CM | POA: Diagnosis not present

## 2021-11-25 MED ORDER — LOSARTAN POTASSIUM 50 MG PO TABS: 50.0000 mg | ORAL_TABLET | Freq: Every day | ORAL | 3 refills | Status: DC

## 2021-11-25 NOTE — Patient Instructions (Signed)
Medication Instructions:  DECREASE LOSARTAN TO 50 MG DAILY   *If you need a refill on your cardiac medications before your next appointment, please call your pharmacy*  Lab Work: NONE  Testing/Procedures: NONE  Follow-Up: At [facility], you and your health needs are our priority.  As part of our continuing mission to provide you with exceptional heart care, we have created designated Provider Care Teams.  These Care Teams include your primary Cardiologist (physician) and Advanced Practice Providers (APPs -  Physician Assistants and Nurse Practitioners) who all work together to provide you with the care you need, when you need it.  We recommend signing up for the patient portal called "MyChart".  Sign up information is provided on this After Visit Summary.  MyChart is used to connect with patients for Virtual Visits (Telemedicine).  Patients are able to view lab/test results, encounter notes, upcoming appointments, etc.  Non-urgent messages can be sent to your provider as well.   To learn more about what you can do with MyChart, go to [email or web address].    Your next appointment:   6 month(s)  The format for your next appointment:   In Person  Provider:   Skeet Latch, MD   PHARM D 1 MONTH  DR CROITORU TO SEE ABOUT HAVING LOOP RECORDER REMOVED   Other Instructions MONITOR BLOOD PRESSURE DAILY, BRING READINGS TO YOUR FOLLOW UP IN 1 MONTH

## 2021-11-25 NOTE — Assessment & Plan Note (Signed)
Mild carotid stenosis.  Continue aspirin and rosuvastatin.  Lipids are well controlled

## 2021-11-25 NOTE — Assessment & Plan Note (Signed)
Mild LE edema is stable on furosemide.  BP well-controlled as above.

## 2021-11-25 NOTE — Assessment & Plan Note (Signed)
BP is well-controlled.  Her BP has been very low at times at home and she gets lightheaded.  We will reduce losartan to '50mg'$ .  She will track her BP at home and bring to follow up.

## 2021-11-25 NOTE — Assessment & Plan Note (Signed)
No recurrent episodes.  Now that she had her hips replaced she no longer unstable and feeling like she will fall.  In retrospect, she thinks that this feeling was more due to gait instability and near syncope.  She has had her loop implantable loop recorder for years and not had any episodes.  She has some discomfort from it and would like it removed.  We will have her see Dr. Sallyanne Kuster for this.

## 2021-11-25 NOTE — Progress Notes (Signed)
Carelink Summary Report / Loop Recorder 

## 2021-11-30 DIAGNOSIS — Z96642 Presence of left artificial hip joint: Secondary | ICD-10-CM | POA: Diagnosis not present

## 2021-11-30 DIAGNOSIS — M1612 Unilateral primary osteoarthritis, left hip: Secondary | ICD-10-CM | POA: Diagnosis not present

## 2021-12-01 DIAGNOSIS — I110 Hypertensive heart disease with heart failure: Secondary | ICD-10-CM | POA: Diagnosis not present

## 2021-12-01 DIAGNOSIS — J4520 Mild intermittent asthma, uncomplicated: Secondary | ICD-10-CM | POA: Diagnosis not present

## 2021-12-03 ENCOUNTER — Inpatient Hospital Stay: Admission: RE | Admit: 2021-12-03 | Payer: Medicare Other | Source: Ambulatory Visit

## 2021-12-03 ENCOUNTER — Encounter (HOSPITAL_BASED_OUTPATIENT_CLINIC_OR_DEPARTMENT_OTHER): Payer: Self-pay | Admitting: Cardiovascular Disease

## 2021-12-03 DIAGNOSIS — I5032 Chronic diastolic (congestive) heart failure: Secondary | ICD-10-CM

## 2021-12-03 NOTE — Telephone Encounter (Signed)
Please advise 

## 2021-12-06 ENCOUNTER — Ambulatory Visit: Payer: Medicare Other | Admitting: Cardiovascular Disease

## 2021-12-06 DIAGNOSIS — I5032 Chronic diastolic (congestive) heart failure: Secondary | ICD-10-CM | POA: Diagnosis not present

## 2021-12-06 DIAGNOSIS — Z95818 Presence of other cardiac implants and grafts: Secondary | ICD-10-CM

## 2021-12-06 MED ORDER — LIDOCAINE-EPINEPHRINE 1 %-1:100000 IJ SOLN: 15.0000 mL | Freq: Once | INTRAMUSCULAR | Status: DC

## 2021-12-06 NOTE — Progress Notes (Signed)
LOOP RECORDER IMPLANT   Procedure report  Procedure performed:  Loop recorder explantation   Reason for procedure:  Device no longer needed Procedure performed by:  Sanda Klein, MD  Complications:  None  Estimated blood loss:  <5 mL  Medications administered during procedure:  Lidocaine 1% with 1/10,000 epinephrine 15 mL locally  Procedure details:  After the risks and benefits of the procedure were discussed the patient provided informed consent. The patient was prepped and draped in usual sterile fashion. Local anesthesia was administered to an area 2 cm to the left of the sternum in the 4th intercostal space. A 1.5-2 cm cutaneous incision was made using a scalpel. A curved hemostat was used to locate and extract the device (with some difficulty due to lateral migration).  Local pressure was held to ensure hemostasis.  The incision was closed with SteriStrips and a sterile dressing was applied.   Sanda Klein, MD, [facility] [phone] office [phone] pager 12/06/2021 1:19 PM

## 2021-12-06 NOTE — Patient Instructions (Signed)
Discharge Instructions for  Loop Recorder Explant/Implant    Follow up: Keep your wound check appointment on 6/26 at 11:50 am. This will be a video visit only  If you have any questions or concerns, please call the office at [phone].  ACTIVITY No restrictions. DO wear your seatbelt, even if it crosses over the site.   WOUND CARE Keep the wound area clean and dry.  Remove the dressing the day after (usually 24 hours after the procedure). DO NOT SUBMERGE UNDER WATER UNTIL FULLY HEALED (no tub baths, hot tubs, swimming pools, etc.).  You  may shower or take a sponge bath after the dressing is removed. DO NOT SOAK the area and do not allow the shower to directly spray on the site. If you have tape/steri-strips on your wound, these will fall off; do not pull them off prematurely.   No bandage is needed on the site.  DO  NOT apply any creams, oils, or ointments to the wound area. If you notice any drainage or discharge from the wound, any swelling, excessive redness or bruising at the site, or if you develop a fever > 101? F, call the office at once at [phone].

## 2021-12-07 DIAGNOSIS — Z96642 Presence of left artificial hip joint: Secondary | ICD-10-CM | POA: Diagnosis not present

## 2021-12-07 DIAGNOSIS — M1612 Unilateral primary osteoarthritis, left hip: Secondary | ICD-10-CM | POA: Diagnosis not present

## 2021-12-07 LAB — BASIC METABOLIC PANEL
BUN/Creatinine Ratio: 10 — ABNORMAL LOW (ref 12–28)
BUN: 9 mg/dL (ref 8–27)
CO2: 25 mmol/L (ref 20–29)
Calcium: 8.9 mg/dL (ref 8.7–10.3)
Chloride: 104 mmol/L (ref 96–106)
Creatinine, Ser: 0.91 mg/dL (ref 0.57–1.00)
Glucose: 103 mg/dL — ABNORMAL HIGH (ref 70–99)
Potassium: 4.1 mmol/L (ref 3.5–5.2)
Sodium: 143 mmol/L (ref 134–144)
eGFR: 67 mL/min/{1.73_m2} (ref >59)

## 2021-12-07 LAB — BRAIN NATRIURETIC PEPTIDE: BNP: 110.8 pg/mL — ABNORMAL HIGH (ref 0.0–100.0)

## 2021-12-13 ENCOUNTER — Ambulatory Visit: Payer: Medicare Other

## 2021-12-14 DIAGNOSIS — Z96642 Presence of left artificial hip joint: Secondary | ICD-10-CM | POA: Diagnosis not present

## 2021-12-14 DIAGNOSIS — M1612 Unilateral primary osteoarthritis, left hip: Secondary | ICD-10-CM | POA: Diagnosis not present

## 2021-12-14 LAB — CUP PACEART REMOTE DEVICE CHECK
Date Time Interrogation Session: 20230606002552
Implantable Pulse Generator Implant Date: 20210614

## 2021-12-20 DIAGNOSIS — M064 Inflammatory polyarthropathy: Secondary | ICD-10-CM | POA: Diagnosis not present

## 2021-12-20 DIAGNOSIS — M79641 Pain in right hand: Secondary | ICD-10-CM | POA: Diagnosis not present

## 2021-12-20 DIAGNOSIS — M359 Systemic involvement of connective tissue, unspecified: Secondary | ICD-10-CM | POA: Diagnosis not present

## 2021-12-20 DIAGNOSIS — Z79899 Other long term (current) drug therapy: Secondary | ICD-10-CM | POA: Diagnosis not present

## 2021-12-20 DIAGNOSIS — M79642 Pain in left hand: Secondary | ICD-10-CM | POA: Diagnosis not present

## 2021-12-20 DIAGNOSIS — M3501 Sicca syndrome with keratoconjunctivitis: Secondary | ICD-10-CM | POA: Diagnosis not present

## 2021-12-20 DIAGNOSIS — M79643 Pain in unspecified hand: Secondary | ICD-10-CM | POA: Diagnosis not present

## 2021-12-23 DIAGNOSIS — M1612 Unilateral primary osteoarthritis, left hip: Secondary | ICD-10-CM | POA: Diagnosis not present

## 2021-12-23 DIAGNOSIS — M170 Bilateral primary osteoarthritis of knee: Secondary | ICD-10-CM | POA: Diagnosis not present

## 2021-12-23 DIAGNOSIS — Z96642 Presence of left artificial hip joint: Secondary | ICD-10-CM | POA: Diagnosis not present

## 2021-12-27 ENCOUNTER — Telehealth (INDEPENDENT_AMBULATORY_CARE_PROVIDER_SITE_OTHER): Payer: Medicare Other | Admitting: Cardiovascular Disease

## 2021-12-27 DIAGNOSIS — Z95818 Presence of other cardiac implants and grafts: Secondary | ICD-10-CM

## 2021-12-28 ENCOUNTER — Ambulatory Visit: Payer: Medicare Other | Admitting: Pharmacist Clinician (PhC)/ Clinical Pharmacy Specialist

## 2021-12-28 ENCOUNTER — Encounter: Payer: Self-pay | Admitting: Pharmacist Clinician (PhC)/ Clinical Pharmacy Specialist

## 2021-12-28 DIAGNOSIS — I10 Essential (primary) hypertension: Secondary | ICD-10-CM | POA: Diagnosis not present

## 2021-12-31 ENCOUNTER — Ambulatory Visit
Admission: RE | Admit: 2021-12-31 | Discharge: 2021-12-31 | Disposition: A | Discharge status: Home / Self Care | Payer: Medicare Other | Source: Ambulatory Visit | Attending: Internal Medicine | Admitting: Internal Medicine

## 2021-12-31 DIAGNOSIS — I110 Hypertensive heart disease with heart failure: Secondary | ICD-10-CM | POA: Diagnosis not present

## 2021-12-31 DIAGNOSIS — J4520 Mild intermittent asthma, uncomplicated: Secondary | ICD-10-CM | POA: Diagnosis not present

## 2021-12-31 DIAGNOSIS — Z1231 Encounter for screening mammogram for malignant neoplasm of breast: Secondary | ICD-10-CM | POA: Diagnosis not present

## 2022-01-14 DIAGNOSIS — H40033 Anatomical narrow angle, bilateral: Secondary | ICD-10-CM | POA: Diagnosis not present

## 2022-01-14 DIAGNOSIS — H2513 Age-related nuclear cataract, bilateral: Secondary | ICD-10-CM | POA: Diagnosis not present

## 2022-01-14 DIAGNOSIS — H04123 Dry eye syndrome of bilateral lacrimal glands: Secondary | ICD-10-CM | POA: Diagnosis not present

## 2022-01-14 DIAGNOSIS — Z79899 Other long term (current) drug therapy: Secondary | ICD-10-CM | POA: Diagnosis not present

## 2022-01-20 ENCOUNTER — Encounter: Payer: Self-pay | Admitting: Nurse Practitioner

## 2022-01-20 ENCOUNTER — Ambulatory Visit (INDEPENDENT_AMBULATORY_CARE_PROVIDER_SITE_OTHER): Payer: Medicare Other

## 2022-01-20 ENCOUNTER — Ambulatory Visit: Payer: Medicare Other | Admitting: Nurse Practitioner

## 2022-01-20 VITALS — BP 132/74 | HR 83 | Temp 98.3°F | Ht 66.0 in | Wt 206.4 lb

## 2022-01-20 DIAGNOSIS — R0602 Shortness of breath: Secondary | ICD-10-CM | POA: Diagnosis not present

## 2022-01-20 DIAGNOSIS — J069 Acute upper respiratory infection, unspecified: Secondary | ICD-10-CM | POA: Insufficient documentation

## 2022-01-20 DIAGNOSIS — H1031 Unspecified acute conjunctivitis, right eye: Secondary | ICD-10-CM

## 2022-01-20 DIAGNOSIS — J4541 Moderate persistent asthma with (acute) exacerbation: Secondary | ICD-10-CM

## 2022-01-20 DIAGNOSIS — H109 Unspecified conjunctivitis: Secondary | ICD-10-CM | POA: Diagnosis not present

## 2022-01-20 DIAGNOSIS — R059 Cough, unspecified: Secondary | ICD-10-CM | POA: Diagnosis not present

## 2022-01-20 DIAGNOSIS — J45909 Unspecified asthma, uncomplicated: Secondary | ICD-10-CM | POA: Diagnosis not present

## 2022-01-20 MED ORDER — DOXYCYCLINE HYCLATE 100 MG PO TABS: 100.0000 mg | ORAL_TABLET | Freq: Two times a day (BID) | ORAL | 0 refills | Status: AC

## 2022-01-20 MED ORDER — AZELASTINE HCL 0.1 % NA SOLN: 1.0000 | Freq: Two times a day (BID) | NASAL | 12 refills | Status: DC

## 2022-01-20 MED ORDER — PREDNISONE 10 MG PO TABS: ORAL_TABLET | ORAL | 0 refills | Status: DC

## 2022-01-20 MED ORDER — POLYMYXIN B-TRIMETHOPRIM 10000-0.1 UNIT/ML-% OP SOLN: 2.0000 [drp] | Freq: Four times a day (QID) | OPHTHALMIC | 0 refills | Status: AC

## 2022-01-20 NOTE — Progress Notes (Signed)
$'@Patient'Y$  ID: Darron Doom, female    DOB: October 23, 1950, 71 y.o.   MRN: [id number]  Chief Complaint  Patient presents with   Acute Visit    Pt states she is here for sore throat, wheezing, coughing and mucus in right eye x1 week.     Referring provider: Merrilee Seashore, MD  HPI: 71 year old female, former smoker followed for asthma and shortness of breath.  She is a patient of Dr. Bari Mantis and last seen in office 10/27/2021.  Past medical history significant for hypertension, HFpEF, lymphoma 2008, SLE on Plaquenil, syncope (loop recorder in place), carotid stenosis, GERD, arthritis, obesity.  She is followed by Dr. Otho Ket with rheumatology.  TEST/EVENTS:  11/2016 CT abdomen: Shows mild chronic bibasilar pulmonary scarring, likely postinflammatory 08/2021 PFTs: FEV1 61%, FVC 57%, ratio 83%, 17% improvement in FVC with albuterol.  10/27/2021: OV with Dr. Elsworth Soho.  Initially seen 08/2021.  She was provided with Symbicort by her PCP in July 2022.  Had improvement in symptoms.  Relief with Symbicort seems to suggest asthma as a possible etiology but sudden onset and lack of wheezing point otherwise.  Initial CXR was clear.  Her BNP was slightly elevated at 213.  RAST panel was negative.  Symbicort expensive so would like alternative.  She was changed from Symbicort to generic Advair 251 puff twice daily.  Advised to continue using albuterol as needed.  Recommended that she start Zyrtec or Claritin for a week to see if she receives any benefit from it.  01/20/2022: Today-acute Patient presents today for acute visit.  She has been having a sore throat, wheezing, coughing for the last week and a half. She was at a cookout on the 7th and developed nasal congestion, wheezing and increased SOB the following day. Symptoms have gotten worse sense. She did test negative for COVID.  Her cough is congested, usually dry, but she will occasionally produce some cream sputum. She has significant nasal congestion and  drainage, which is clear, and thinks her sore throat is related to this. She has some hoarseness to her voice as well. She has felt more short of breath with activity and coughing spells and has been using her albuterol inhaler 2-3 times a day.  She also has some drainage from her right eye which is white to yellow, worse in the mornings. It is itchy and red. Wakes up with it crusted. No vision changes or pain. She denies any fevers, chills, hemoptysis. She is eating and drinking well. She does have chronic swelling in her left ankle after she fractured it over a year ago; unchanged from baseline. She is using advair Twice daily.   Allergies  Allergen Reactions   Iodinated Contrast Media Other (See Comments)    Kidney failure.    Ciprofloxacin Itching   E-Mycin [Erythromycin Base] Nausea And Vomiting   Oxycodone Other (See Comments)    Hallucination.    Immunization History  Administered Date(s) Administered   Influenza, High Dose Seasonal PF 06/11/2014, 04/14/2015, 05/16/2016   Influenza, Quadrivalent, Recombinant, Inj, Pf 03/21/2017, 04/10/2018, 02/22/2019, 04/10/2020, 05/04/2021   Influenza-Unspecified 06/08/2011, 04/05/2012, 05/18/2012, 05/30/2012, 05/24/2013   PFIZER(Purple Top)SARS-COV-2 Vaccination 08/16/2019, 09/10/2019, 04/03/2020   Pneumococcal Conjugate-13 09/12/2017   Pneumococcal Polysaccharide-23 05/27/2002, 10/17/2012, 08/17/2018, 10/11/2019   Tdap 04/05/2012    Past Medical History:  Diagnosis Date   Acid reflux    Arthritis    Asthma    Carotid stenosis 10/29/2020   Chronic diastolic heart failure ([place]) 08/30/2019   Gait instability 10/29/2020  Hypertension    Lupus ([place]) 07/13/2011   Malignant lymphoma, 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     albuterol (VENTOLIN HFA) 108 (90 Base) MCG/ACT inhaler Inhale 2 puffs into the lungs every 6 (six) hours as needed for wheezing or shortness of breath. 8 g 6   aspirin 81 MG chewable tablet Chew by mouth daily.     Biotin 1000 MCG tablet Take 1,000 mcg by mouth daily.     cholecalciferol (VITAMIN D) 1000 UNITS tablet Take 1,000 Units by mouth daily.      fluticasone-salmeterol (ADVAIR HFA) 115-21 MCG/ACT inhaler Inhale 2 puffs into the lungs 2 (two) times daily. 1 each 12   furosemide (LASIX) 80 MG tablet TAKE ONE AND ONE-HALF TABLETS BY MOUTH DAILY. NEW DOSE 135 tablet 1   hydroxychloroquine (PLAQUENIL) 200 MG tablet Take 200 mg by mouth daily.     latanoprost (XALATAN) 0.005 % ophthalmic solution Place 1 drop into both eyes at bedtime.     losartan (COZAAR) 50 MG tablet Take 1 tablet (50 mg total) by mouth daily. 90 tablet 3   Omega-3 Fatty Acids (OMEGA 3 500 PO) Take 1 capsule by mouth daily.     pantoprazole (PROTONIX) 40 MG tablet Take 40 mg by mouth daily.     potassium chloride SA (K-DUR,KLOR-CON) 20 MEQ tablet Take 2 tablets (40 mEq total) by mouth 2 (two) times daily.     rosuvastatin (CRESTOR) 20 MG tablet TAKE 1 TABLET BY MOUTH DAILY GENERIC EQUIVALENT FOR CRESTOR 90 tablet 1   Saccharomyces boulardii (PROBIOTIC) 250 MG CAPS Take 1 capsule by mouth daily.     Spacer/Aero-Holding Chambers (AEROCHAMBER MV) inhaler Use as instructed 1 each 0   Turmeric 500 MG CAPS Take by mouth.     Facility-Administered Medications Prior to Visit  Medication Dose Route Frequency Provider Last Rate Last Admin   lidocaine-EPINEPHrine  (XYLOCAINE W/EPI) 1 %-1:100000 (with pres) injection 10 mL  10 mL Infiltration Once Croitoru, Mihai, MD       lidocaine-EPINEPHrine (XYLOCAINE W/EPI) 1 %-1:100000 (with pres) injection 15 mL  15 mL Infiltration Once Croitoru, Mihai, MD         Review of Systems:   Constitutional: No weight loss or gain, night sweats, fevers, chills, fatigue, or lassitude. HEENT: No headaches, difficulty swallowing, tooth/dental problems. No sneezing, itching, ear ache +nasal congestion/drainage, postnasal drip, sore throat, eye redness and drainage CV:  No chest pain, orthopnea, PND, swelling in lower extremities, anasarca, dizziness, palpitations, syncope Resp: +shortness of breath with exertion; congest, occasionally productive cough; wheezing. No hemoptysis. No chest wall deformity GI:  No heartburn, indigestion, abdominal pain, nausea, vomiting, diarrhea, change in bowel habits, loss of appetite, bloody stools.  Skin: No rash, lesions, ulcerations MSK:  No joint pain or swelling.  No decreased range of motion.  No back  pain. Neuro: No dizziness or lightheadedness.  Psych: No depression or anxiety. Mood stable.     Physical Exam:  BP 132/74 (BP Location: Right Arm, Patient Position: Sitting, Cuff Size: Large)   Pulse 83   Temp 98.3 F (36.8 C) (Oral)   Ht '5\' 6"'$  (1.676 m)   Wt 206 lb 6.4 oz (93.6 kg)   SpO2 96%   BMI 33.31 kg/m   GEN: Pleasant, interactive, acutely-ill appearing; obese; in no acute distress. HEENT:  Normocephalic and atraumatic. EACs patent bilaterally. TM pearly gray with present light reflex bilaterally. PERRLA. Left sclera white. Right sclera injected; no exudate. Nasal turbinates erythematous, moist and patent bilaterally. Clear rhinorrhea present. Oropharynx erythematous and moist, without exudate or edema. No lesions, ulcerations NECK:  Supple w/ fair ROM. No JVD present. Normal carotid impulses w/o bruits. Thyroid symmetrical with no goiter or nodules palpated. No  lymphadenopathy.   CV: RRR, no m/r/g, no peripheral edema. Pulses intact, +2 bilaterally. No cyanosis, pallor or clubbing. PULMONARY:  Unlabored, regular breathing. Scattered rhonchi bilaterally A&P. No accessory muscle use. No dullness to percussion. GI: BS present and normoactive. Soft, non-tender to palpation. No organomegaly or masses detected. No CVA tenderness. MSK: No erythema, warmth or tenderness. Cap refil <2 sec all extrem. No deformities or joint swelling noted.  Neuro: A/Ox3. No focal deficits noted.   Skin: Warm, no lesions or rashe Psych: Normal affect and behavior. Judgement and thought content appropriate.     Lab Results:  CBC    Component Value Date/Time   WBC 5.3 03/19/2019 1227   RBC 4.08 03/19/2019 1227   HGB 12.6 03/19/2019 1227   HGB 12.2 05/18/2015 1256   HGB 9.6 (L) 12/17/2007 1011   HCT 39.7 03/19/2019 1227   HCT 37.6 05/18/2015 1256   HCT 27.7 (L) 12/17/2007 1011   PLT 161 03/19/2019 1227   PLT 147 05/18/2015 1256   PLT 91 (L) 12/17/2007 1011   MCV 97.3 03/19/2019 1227   MCV 93 05/18/2015 1256   MCV 98.3 12/17/2007 1011   MCH 30.9 03/19/2019 1227   MCHC 31.7 03/19/2019 1227   RDW 12.7 03/19/2019 1227   RDW 13.4 05/18/2015 1256   RDW 19.7 (H) 12/17/2007 1011   LYMPHSABS 2.3 08/09/2018 1011   LYMPHSABS 1.1 05/18/2015 1256   LYMPHSABS 0.6 (L) 12/17/2007 1011   MONOABS 0.6 08/09/2018 1011   MONOABS 0.5 12/17/2007 1011   EOSABS 0.1 08/09/2018 1011   EOSABS 0.0 05/18/2015 1256   BASOSABS 0.1 08/09/2018 1011   BASOSABS 0.0 05/18/2015 1256   BASOSABS 0.0 12/17/2007 1011    BMET    Component Value Date/Time   NA 143 12/06/2021 1109   NA 142 05/18/2015 1257   K 4.1 12/06/2021 1109   K 3.6 05/18/2015 1257   CL 104 12/06/2021 1109   CL 104 01/15/2013 0844   CO2 25 12/06/2021 1109   CO2 26 05/18/2015 1257   GLUCOSE 103 (H) 12/06/2021 1109   GLUCOSE 67 (L) 08/24/2021 1512   GLUCOSE 90 05/18/2015 1257   GLUCOSE 103 01/15/2013 0844   BUN 9  12/06/2021 1109   BUN 12.6 05/18/2015 1257   CREATININE 0.91 12/06/2021 1109   CREATININE 0.8 05/18/2015 1257   CALCIUM 8.9 12/06/2021 1109   CALCIUM 9.1 05/18/2015 1257   GFRNONAA 65 01/30/2020 1241   GFRAA 75 01/30/2020 1241    BNP    Component Value Date/Time   BNP 110.8 (H) 12/06/2021 1109     Imaging:  DG Chest 2  View  Result Date: 01/20/2022 CLINICAL DATA:  Productive cough. Increased shortness of breath. Asthma exacerbation. EXAM: CHEST - 2 VIEW COMPARISON:  Chest two views 08/24/2021 FINDINGS: Cardiac silhouette is again mildly enlarged. Mediastinal contours are within normal limits. The lungs are clear. No pleural effusion or pneumothorax. Mild-to-moderate multilevel degenerative disc changes of the thoracic spine. Mild levocurvature at the thoracolumbar junction. Cholecystectomy clips. Reverse total left shoulder arthroplasty hardware. IMPRESSION: No active cardiopulmonary disease. Electronically Signed   By: Yvonne Kendall M.D.   On: 01/20/2022 11:08   MM 3D SCREEN BREAST BILATERAL  Result Date: 01/03/2022 CLINICAL DATA:  Screening. EXAM: DIGITAL SCREENING BILATERAL MAMMOGRAM WITH TOMOSYNTHESIS AND CAD TECHNIQUE: Bilateral screening digital craniocaudal and mediolateral oblique mammograms were obtained. Bilateral screening digital breast tomosynthesis was performed. The images were evaluated with computer-aided detection. COMPARISON:  Previous exam(s). ACR Breast Density Category b: There are scattered areas of fibroglandular density. FINDINGS: There are no findings suspicious for malignancy. IMPRESSION: No mammographic evidence of malignancy. A result letter of this screening mammogram will be mailed directly to the patient. RECOMMENDATION: Screening mammogram in one year. (Code:SM-B-01Y) BI-RADS CATEGORY  1: Negative. Electronically Signed   By: Lajean Manes M.D.   On: 01/03/2022 09:44         Latest Ref Rng & Units 09/03/2021    9:55 AM  PFT Results  FVC-Pre L 1.47    FVC-Predicted Pre % 57   FVC-Post L 1.65   FVC-Predicted Post % 65   Pre FEV1/FVC % % 83   Post FEV1/FCV % % 86   FEV1-Pre L 1.22   FEV1-Predicted Pre % 61   FEV1-Post L 1.42     No results found for: "NITRICOXIDE"      Assessment & Plan:   Asthma Acute asthmatic bronchitis exacerbation with elevated exhaled nitric oxide possibly triggered by allergies versus URI.  Nontoxic and in no acute distress.  Vital signs stable at OV.  We will treat her with empiric doxycycline and prednisone taper.  Obtain CXR today to rule out superimposed infection.  Continue ICS/LABA therapy and as needed albuterol.  Close follow-up.  Patient Instructions  Continue Advair 2 puffs Twice daily. Brush tongue and rinse mouth afterwards  Continue Albuterol inhaler 2 puffs every 6 hours as needed for shortness of breath or wheezing. Notify if symptoms persist despite rescue inhaler/neb use.  Continue protonix 40 mg daily   -Polytrim drops 2 drops right eye every 6 hours (4 times a day) for 7 days.  -Prednisone taper. 4 tabs for 2 days, then 3 tabs for 2 days, 2 tabs for 2 days, then 1 tab for 2 days, then stop. Take in AM with food -Doxycycline 1 tab Twice daily for 7 days. Take with food. Avoid direct sun exposure and wear sunscreen when outside while taking -Mucinex DM Twice daily for cough/congestion  -Astelin nasal spray 1-2 sprays each nostril Twice daily for nasal congestion/drainage -Saline nasal spray 2-3 times a day   Chest x ray today. We will notify you of any abnormal results   Follow up in two weeks with Dr. Elsworth Soho or Joellen Jersey Jessee Mezera,NP. If symptoms do not improve or worsen, please contact office for sooner follow up or seek emergency care.     URI (upper respiratory infection) Possible viral URI versus flare in allergies. Supportive care advised. Postnasal drainage control with astelin - unable to tolerate flonase in past d/t the smell of it.   Bacterial conjunctivitis of right eye Redness,  purulent drainage and  crusting in AM, with itching, consistent with bacterial conjunctivitis. No vision changes or severe eye pain. We will treat her with abx drops. Advised to notify PCP or ophthalmologist if no improvement.    I spent 35 minutes of dedicated to the care of this patient on the date of this encounter to include pre-visit review of records, face-to-face time with the patient discussing conditions above, post visit ordering of testing, clinical documentation with the electronic health record, making appropriate referrals as documented, and communicating necessary findings to members of the patients care team.  Clayton Bibles, NP 01/20/2022  Pt aware and understands NP's role.

## 2022-01-20 NOTE — Patient Instructions (Addendum)
Continue Advair 2 puffs Twice daily. Brush tongue and rinse mouth afterwards  Continue Albuterol inhaler 2 puffs every 6 hours as needed for shortness of breath or wheezing. Notify if symptoms persist despite rescue inhaler/neb use.  Continue protonix 40 mg daily   -Polytrim drops 2 drops right eye every 6 hours (4 times a day) for 7 days.  -Prednisone taper. 4 tabs for 2 days, then 3 tabs for 2 days, 2 tabs for 2 days, then 1 tab for 2 days, then stop. Take in AM with food -Doxycycline 1 tab Twice daily for 7 days. Take with food. Avoid direct sun exposure and wear sunscreen when outside while taking -Mucinex DM Twice daily for cough/congestion  -Astelin nasal spray 1-2 sprays each nostril Twice daily for nasal congestion/drainage -Saline nasal spray 2-3 times a day   Chest x ray today. We will notify you of any abnormal results   Follow up in two weeks with Dr. Elsworth [name] or Erica Jersey Saleena Tamas,NP. If symptoms do not improve or worsen, please contact office for sooner follow up or seek emergency care.

## 2022-01-20 NOTE — Assessment & Plan Note (Signed)
Acute asthmatic bronchitis exacerbation with elevated exhaled nitric oxide possibly triggered by allergies versus URI.  Nontoxic and in no acute distress.  Vital signs stable at OV.  We will treat her with empiric doxycycline and prednisone taper.  Obtain CXR today to rule out superimposed infection.  Continue ICS/LABA therapy and as needed albuterol.  Close follow-up.  Patient Instructions  Continue Advair 2 puffs Twice daily. Brush tongue and rinse mouth afterwards  Continue Albuterol inhaler 2 puffs every 6 hours as needed for shortness of breath or wheezing. Notify if symptoms persist despite rescue inhaler/neb use.  Continue protonix 40 mg daily   -Polytrim drops 2 drops right eye every 6 hours (4 times a day) for 7 days.  -Prednisone taper. 4 tabs for 2 days, then 3 tabs for 2 days, 2 tabs for 2 days, then 1 tab for 2 days, then stop. Take in AM with food -Doxycycline 1 tab Twice daily for 7 days. Take with food. Avoid direct sun exposure and wear sunscreen when outside while taking -Mucinex DM Twice daily for cough/congestion  -Astelin nasal spray 1-2 sprays each nostril Twice daily for nasal congestion/drainage -Saline nasal spray 2-3 times a day   Chest x ray today. We will notify you of any abnormal results   Follow up in two weeks with Dr. Elsworth Soho or Joellen Jersey Lakeithia Rasor,NP. If symptoms do not improve or worsen, please contact office for sooner follow up or seek emergency care.

## 2022-01-20 NOTE — Assessment & Plan Note (Signed)
Redness, purulent drainage and crusting in AM, with itching, consistent with bacterial conjunctivitis. No vision changes or severe eye pain. We will treat her with abx drops. Advised to notify PCP or ophthalmologist if no improvement.

## 2022-01-20 NOTE — Assessment & Plan Note (Signed)
Possible viral URI versus flare in allergies. Supportive care advised. Postnasal drainage control with astelin - unable to tolerate flonase in past d/t the smell of it.

## 2022-02-01 ENCOUNTER — Encounter: Payer: Self-pay | Admitting: Emergency Medicine

## 2022-02-01 ENCOUNTER — Ambulatory Visit: Payer: Medicare Other | Admitting: Emergency Medicine

## 2022-02-01 DIAGNOSIS — K2100 Gastro-esophageal reflux disease with esophagitis, without bleeding: Secondary | ICD-10-CM

## 2022-02-01 DIAGNOSIS — J4540 Moderate persistent asthma, uncomplicated: Secondary | ICD-10-CM | POA: Diagnosis not present

## 2022-02-01 MED ORDER — TRIAMCINOLONE ACETONIDE 55 MCG/ACT NA AERO: 2.0000 | INHALATION_SPRAY | Freq: Every day | NASAL | 12 refills | Status: DC

## 2022-02-01 NOTE — Addendum Note (Signed)
Addended by: Gavin Potters R on: 02/01/2022 09:50 AM   Modules accepted: Orders

## 2022-02-01 NOTE — Assessment & Plan Note (Signed)
Continue your Protonix as you have been taking it. ?

## 2022-02-01 NOTE — Patient Instructions (Signed)
Please continue Advair 2 puffs twice a day.  Rinse and gargle after using. Keep your albuterol available to use 2 puffs if needed for shortness of breath, chest tightness, wheezing Try starting Nasacort 2 sprays each nostril once daily You can stop using your Astelin nasal spray on a schedule.  Keep it available to use 2 sprays if you have persistent nasal drainage Continue your Protonix as you have been taking it. Follow with Dr. Elsworth Soho in 3 months.  Call sooner if you have any problems.

## 2022-02-01 NOTE — Assessment & Plan Note (Signed)
Improved from recent exacerbation.  She does still have nasal congestion and drainage.  She has right-sided nasal drainage year-round.  Reasonable to try treating with nasal steroid but she may need to be on scheduled Astelin or Atrovent nasal spray.  Her exertional dyspnea has improved, close to baseline but she does still have shortness of breath.  She is planning to talk to cardiology about this.  No wheezing on exam today.  Please continue Advair 2 puffs twice a day.  Rinse and gargle after using. Keep your albuterol available to use 2 puffs if needed for shortness of breath, chest tightness, wheezing Try starting Nasacort 2 sprays each nostril once daily You can stop using your Astelin nasal spray on a schedule.  Keep it available to use 2 sprays if you have persistent nasal drainage Follow with Dr. Elsworth Soho in 3 months.  Call sooner if you have any problems.

## 2022-02-01 NOTE — Progress Notes (Signed)
Subjective:    Patient ID: [name], female    DOB: July 09, 1950, 71 y.o.   MRN: [id number]  HPI Mrs. Cangemi is 52, followed by Dr. Elsworth Soho in our office for dyspnea in the setting of restrictive lung disease, asthma.  She has a history of tobacco use.  PMH also significant for hypertension with diastolic dysfunction, SLE on Plaquenil (Dr. Dossie Der), lymphoma (2008), GERD on Protonix, arthritis, carotid stenosis. She was treated for a flare of her asthma and right eye conjunctivitis in late July, received prednisone and doxycycline, Polytrim eyedrops Remains on Protonix, added Astelin nasal spray, Mucinex DM, saline nasal spray She returns today reporting that her sx are improved, had been experiencing exertional SOB, more albuterol use. She is improved - feels back to baseline. She is still having a lot of nasal congestion especially on the R. She is still dealing with some exertional SOB. Her eye is better.     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   Near syncope 08/30/2019   PONV (postoperative nausea and vomiting)    Stroke ([facility])    lt side weakness     Family History  Problem Relation Age of Onset   Lung disease Father    Coronary artery disease Brother    Breast cancer Neg Hx      Social History   Socioeconomic History   Marital status: Married    Spouse name: Not on file   Number of children: Not on file   Years of education: Not on file   Highest education level: Not on file  Occupational History   Not on file  Tobacco Use   Smoking status: Former    Packs/day: 1.00    Years: 10.00    Total pack years: 10.00    Types: Cigarettes    Quit date: 06/17/1994    Years since quitting: 27.6     Passive exposure: Past   Smokeless tobacco: Never  Vaping Use   Vaping Use: Never used  Substance and Sexual Activity   Alcohol use: Not Currently    Alcohol/week: 0.0 standard drinks of alcohol   Drug use: No   Sexual activity: Not Currently  Other Topics Concern   Not on file  Social History Narrative   Not on file   Social Determinants of Health   Financial Resource Strain: Not on file  Food Insecurity: Not on file  Transportation Needs: Not on file  Physical Activity: Not on file  Stress: Not on file  Social Connections: Not on file  Intimate Partner Violence: Not on file     Allergies  Allergen Reactions   Iodinated Contrast Media Other (See Comments)    Kidney failure.    Ciprofloxacin Itching   E-Mycin [Erythromycin Base] Nausea And Vomiting   Oxycodone Other (See Comments)    Hallucination.     Outpatient Medications Prior to Visit  Medication Sig Dispense Refill   acetaminophen (TYLENOL) 500 MG tablet Take 1,000 mg by mouth every 6 (six) hours as needed for mild pain or headache.     albuterol (VENTOLIN HFA) 108 (90 Base) MCG/ACT inhaler Inhale 2 puffs into the lungs every 6 (six) hours as  needed for wheezing or shortness of breath. 8 g 6   aspirin 81 MG chewable tablet Chew by mouth daily.     azelastine (ASTELIN) 0.1 % nasal spray Place 1 spray into both nostrils 2 (two) times daily. Use in each nostril as directed 30 mL 12   Biotin 1000 MCG tablet Take 1,000 mcg by mouth daily.     cholecalciferol (VITAMIN D) 1000 UNITS tablet Take 1,000 Units by mouth daily.      fluticasone-salmeterol (ADVAIR HFA) 115-21 MCG/ACT inhaler Inhale 2 puffs into the lungs 2 (two) times daily. 1 each 12   furosemide (LASIX) 80 MG tablet TAKE ONE AND ONE-HALF TABLETS BY MOUTH DAILY. NEW DOSE 135 tablet 1   hydroxychloroquine (PLAQUENIL) 200 MG tablet Take 200 mg by mouth daily.     latanoprost (XALATAN) 0.005 % ophthalmic solution Place 1 drop into both eyes at bedtime.      losartan (COZAAR) 50 MG tablet Take 1 tablet (50 mg total) by mouth daily. 90 tablet 3   Omega-3 Fatty Acids (OMEGA 3 500 PO) Take 1 capsule by mouth daily.     pantoprazole (PROTONIX) 40 MG tablet Take 40 mg by mouth daily.     potassium chloride SA (K-DUR,KLOR-CON) 20 MEQ tablet Take 2 tablets (40 mEq total) by mouth 2 (two) times daily.     predniSONE (DELTASONE) 10 MG tablet 4 tabs for 2 days, then 3 tabs for 2 days, 2 tabs for 2 days, then 1 tab for 2 days, then stop 20 tablet 0   rosuvastatin (CRESTOR) 20 MG tablet TAKE 1 TABLET BY MOUTH DAILY GENERIC EQUIVALENT FOR CRESTOR 90 tablet 1   Saccharomyces boulardii (PROBIOTIC) 250 MG CAPS Take 1 capsule by mouth daily.     Spacer/Aero-Holding Chambers (AEROCHAMBER MV) inhaler Use as instructed 1 each 0   Turmeric 500 MG CAPS Take by mouth.     Facility-Administered Medications Prior to Visit  Medication Dose Route Frequency Provider Last Rate Last Admin   lidocaine-EPINEPHrine (XYLOCAINE W/EPI) 1 %-1:100000 (with pres) injection 10 mL  10 mL Infiltration Once Croitoru, Mihai, MD       lidocaine-EPINEPHrine (XYLOCAINE W/EPI) 1 %-1:100000 (with pres) injection 15 mL  15 mL Infiltration Once Croitoru, Mihai, MD             Objective:   Physical Exam  Vitals:   02/01/22 0911  BP: 128/74  Pulse: 68  Temp: 98.1 F (36.7 C)  TempSrc: Oral  SpO2: 100%  Weight: 207 lb 3.2 oz (94 kg)  Height: '5\' 6"'$  (1.676 m)   Gen: Pleasant, well-nourished, in no distress,  normal affect  ENT: No lesions,  mouth clear,  oropharynx clear, no postnasal drip  Neck: No JVD, no stridor  Lungs: No use of accessory muscles, no crackles or wheezing on normal respiration, no wheeze on forced expiration  Cardiovascular: RRR, heart sounds normal, no murmur or gallops, no peripheral edema  Musculoskeletal: No deformities, no cyanosis or clubbing  Neuro: alert, awake, non focal  Skin: Warm, no lesions or rash      Assessment & Plan:  Asthma Improved  from recent exacerbation.  She does still have nasal congestion and drainage.  She has right-sided nasal drainage year-round.  Reasonable to try treating with nasal steroid but she may need to be on scheduled Astelin or Atrovent nasal spray.  Her exertional dyspnea has improved, close to baseline but she does still have shortness of breath.  She is planning to talk to cardiology  about this.  No wheezing on exam today.  Please continue Advair 2 puffs twice a day.  Rinse and gargle after using. Keep your albuterol available to use 2 puffs if needed for shortness of breath, chest tightness, wheezing Try starting Nasacort 2 sprays each nostril once daily You can stop using your Astelin nasal spray on a schedule.  Keep it available to use 2 sprays if you have persistent nasal drainage Follow with Dr. Elsworth Soho in 3 months.  Call sooner if you have any problems.  GERD Continue your Protonix as you have been taking it.  Baltazar Apo, MD, PhD 02/01/2022, 9:38 AM [facility] [phone] or if no answer before 7:00PM call [phone] For any issues after 7:00PM please call eLink [phone]

## 2022-02-08 ENCOUNTER — Telehealth: Payer: Self-pay | Admitting: Emergency Medicine

## 2022-02-08 DIAGNOSIS — I10 Essential (primary) hypertension: Secondary | ICD-10-CM | POA: Diagnosis not present

## 2022-02-08 DIAGNOSIS — N182 Chronic kidney disease, stage 2 (mild): Secondary | ICD-10-CM | POA: Diagnosis not present

## 2022-02-08 DIAGNOSIS — R7303 Prediabetes: Secondary | ICD-10-CM | POA: Diagnosis not present

## 2022-02-08 DIAGNOSIS — E782 Mixed hyperlipidemia: Secondary | ICD-10-CM | POA: Diagnosis not present

## 2022-02-08 NOTE — Telephone Encounter (Signed)
I called her and made a follow up with a APP this week per Dr. Lamonte Sakai. She voices understanding and nothing further needed.

## 2022-02-08 NOTE — Telephone Encounter (Signed)
I think we need to get her back in to see Dr Elsworth Soho or an APP to troubleshoot further

## 2022-02-08 NOTE — Telephone Encounter (Signed)
She is losing her voices, she has some swelling in her eyes, nasal congestion, chest congestions and dry cough. She denies any chills, body aches or fever.  She is taking the  Mucinex and over the counter allergy medications but she did not remember the name of the allergy medication.  She is taking her eye drops for allergies as well and feels its not helping.  She has some dark green coming out her eyes for about 2 days. She reports she was feeling some better for a day or 2 after the last visit but it has come back worse. She wants to know if you have any other recommendations. Please advise.

## 2022-02-10 ENCOUNTER — Ambulatory Visit: Payer: Medicare Other | Admitting: Nurse Practitioner

## 2022-02-10 ENCOUNTER — Ambulatory Visit (INDEPENDENT_AMBULATORY_CARE_PROVIDER_SITE_OTHER): Payer: Medicare Other

## 2022-02-10 ENCOUNTER — Encounter: Payer: Self-pay | Admitting: Nurse Practitioner

## 2022-02-10 VITALS — BP 126/72 | HR 79 | Ht 66.0 in | Wt 206.0 lb

## 2022-02-10 DIAGNOSIS — J019 Acute sinusitis, unspecified: Secondary | ICD-10-CM | POA: Insufficient documentation

## 2022-02-10 DIAGNOSIS — J4541 Moderate persistent asthma with (acute) exacerbation: Secondary | ICD-10-CM

## 2022-02-10 DIAGNOSIS — H109 Unspecified conjunctivitis: Secondary | ICD-10-CM

## 2022-02-10 DIAGNOSIS — J0191 Acute recurrent sinusitis, unspecified: Secondary | ICD-10-CM | POA: Diagnosis not present

## 2022-02-10 DIAGNOSIS — H1031 Unspecified acute conjunctivitis, right eye: Secondary | ICD-10-CM

## 2022-02-10 DIAGNOSIS — J45909 Unspecified asthma, uncomplicated: Secondary | ICD-10-CM | POA: Diagnosis not present

## 2022-02-10 LAB — NITRIC OXIDE: Nitric Oxide: 31

## 2022-02-10 MED ORDER — ERYTHROMYCIN 5 MG/GM OP OINT: 1.0000 | TOPICAL_OINTMENT | Freq: Four times a day (QID) | OPHTHALMIC | 0 refills | Status: AC

## 2022-02-10 MED ORDER — FLUTICASONE-SALMETEROL 230-21 MCG/ACT IN AERO: 2.0000 | INHALATION_SPRAY | Freq: Two times a day (BID) | RESPIRATORY_TRACT | 5 refills | Status: DC

## 2022-02-10 MED ORDER — PREDNISONE 10 MG PO TABS: ORAL_TABLET | ORAL | 0 refills | Status: DC

## 2022-02-10 MED ORDER — METHYLPREDNISOLONE ACETATE 80 MG/ML IJ SUSP
80.0000 mg | Freq: Once | INTRAMUSCULAR | Status: AC
  Administered 2022-02-10: 80 mg via INTRAMUSCULAR

## 2022-02-10 MED ORDER — BENZONATATE 200 MG PO CAPS: 200.0000 mg | ORAL_CAPSULE | Freq: Three times a day (TID) | ORAL | 1 refills | Status: DC | PRN

## 2022-02-10 NOTE — Progress Notes (Signed)
$'@Patient'b$  ID: [name], female    DOB: 1950-12-22, 71 y.o.   MRN: [id number]  Chief Complaint  Patient presents with   Follow-up    F/U on asthma. States she had a flare up 2 weeks ago and felt fine last week. Symptoms have returned this week. Increased fatigue, wheezing and SOB.     Referring provider: Merrilee Seashore, MD  HPI: 71 year old female, former smoker followed for asthma and shortness of breath.  She is a patient of Dr. Bari Mantis and last seen in office 02/01/2022 by Dr Lamonte Sakai.  Past medical history significant for hypertension, HFpEF, lymphoma 2008, SLE on Plaquenil, syncope (loop recorder in place), carotid stenosis, GERD, arthritis, obesity.  She is followed by Dr. Otho Ket with rheumatology.  TEST/EVENTS:  11/2016 CT abdomen: Shows mild chronic bibasilar pulmonary scarring, likely postinflammatory 08/2021 PFTs: FEV1 61%, FVC 57%, ratio 83%, 17% improvement in FVC with albuterol. 01/20/2022 CXR 2 view: lungs are clear.   10/27/2021: OV with Dr. Elsworth Soho.  Initially seen 08/2021.  She was provided with Symbicort by her PCP in July 2022.  Had improvement in symptoms.  Relief with Symbicort seems to suggest asthma as a possible etiology but sudden onset and lack of wheezing point otherwise.  Initial CXR was clear.  Her BNP was slightly elevated at 213.  RAST panel was negative.  Symbicort expensive so would like alternative.  She was changed from Symbicort to generic Advair 251 puff twice daily.  Advised to continue using albuterol as needed.  Recommended that she start Zyrtec or Claritin for a week to see if she receives any benefit from it.  01/20/2022: OV with Sparsh Callens NP for acute visit.  She has been having a sore throat, wheezing, coughing for the last week and a half. She was at a cookout on the 7th and developed nasal congestion, wheezing and increased SOB the following day. Symptoms have gotten worse sense. She did test negative for COVID.  Her cough is congested, usually dry, but she  will occasionally produce some cream sputum. She has significant nasal congestion and drainage, which is clear, and thinks her sore throat is related to this. She has some hoarseness to her voice as well. She has felt more short of breath with activity and coughing spells and has been using her albuterol inhaler 2-3 times a day.  Treated for acute asthmatic bronchitis exacerbation with elevated exhaled nitric oxide possibly triggered by allergies versus URI.  Nontoxic and in no acute distress.  Vital signs stable at OV.  We will treat her with empiric doxycycline and prednisone taper.  CXR without superimposed infection. Continue ICS/LABA therapy and as needed albuterol.  Close follow-up.She also has some drainage from her right eye which is white to yellow, worse in the mornings. It is itchy and red. Wakes up with it crusted. No vision changes or pain. Treated with polytrim drops x 7 days.  02/01/2022: OV with Dr. Lamonte Sakai. Improved from recent exacerbation. Still having some nasal congestion and drainage - advised to resume nasocort. Ok to stop astelin on scheduled basis and use PRN.   02/10/2022: Today - acute Patient presents today for acute visit. She was treated three weeks ago for acute asthma exacerbation with elevated exhaled nitric oxide likely related to URI as well as bacterial conjunctivitis. She was initially doing better but over the past week, her symptoms have returned. She is coughing frequently, which is congested and paroxysmal at times. Unable to produce sputum. She also has wheezing and  feels more winded when compared to her baseline. Nasal congestion is persistent despite using nasocort. She did feel like it was better with the astelin spray. She also can feel a lot of drainage down the back of her throat. She denies fevers, chills, hemoptysis, leg swelling, orthopnea. Continues on Advair Twice daily; using rescue daily. She takes an OTC antihistamine. Her eye was starting to clear up on  polytrim drops but she completed the 7 day course and now has more redness, a lot of itching, crusting in the morning, and gray drainage. She has not been to her eye doctor for this. She does not wear contacts. No vision changes or eye pain.   Allergies  Allergen Reactions   Iodinated Contrast Media Other (See Comments)    Kidney failure.    Ciprofloxacin Itching   E-Mycin [Erythromycin Base] Nausea And Vomiting   Oxycodone Other (See Comments)    Hallucination.    Immunization History  Administered Date(s) Administered   Influenza, High Dose Seasonal PF 06/11/2014, 04/14/2015, 05/16/2016   Influenza, Quadrivalent, Recombinant, Inj, Pf 03/21/2017, 04/10/2018, 02/22/2019, 04/10/2020, 05/04/2021   Influenza-Unspecified 06/08/2011, 04/05/2012, 05/18/2012, 05/30/2012, 05/24/2013   PFIZER(Purple Top)SARS-COV-2 Vaccination 08/16/2019, 09/10/2019, 04/03/2020   PNEUMOCOCCAL CONJUGATE-20 11/09/2021   Pneumococcal Conjugate-13 09/12/2017   Pneumococcal Polysaccharide-23 05/27/2002, 10/17/2012, 08/17/2018, 10/11/2019   Tdap 04/05/2012   Zoster Recombinat (Shingrix) 11/02/2021    Past Medical History:  Diagnosis Date   Acid reflux    Arthritis    Asthma    Carotid stenosis 10/29/2020   Chronic diastolic heart failure ([place]) 08/30/2019   Gait instability 10/29/2020   Hypertension    Lupus ([place]) 07/13/2011   Malignant lymphoma, 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     albuterol (VENTOLIN HFA) 108 (90 Base) MCG/ACT inhaler Inhale 2 puffs into the lungs every 6 (six) hours as needed for wheezing or shortness of breath. 8 g 6   aspirin 81 MG chewable tablet Chew by mouth daily.     Biotin 1000 MCG tablet Take 1,000 mcg by mouth daily.     cholecalciferol (VITAMIN D) 1000 UNITS tablet Take 1,000 Units by mouth daily.      furosemide (LASIX) 80 MG tablet TAKE ONE AND ONE-HALF TABLETS BY MOUTH DAILY. NEW DOSE 135 tablet 1   hydroxychloroquine (PLAQUENIL) 200 MG tablet Take 200 mg by mouth daily.     latanoprost (XALATAN) 0.005 % ophthalmic solution Place 1 drop into both eyes at bedtime.     losartan (COZAAR) 50 MG tablet Take 1 tablet (50 mg total) by mouth daily. 90 tablet 3   Omega-3 Fatty Acids (OMEGA 3 500 PO) Take 1 capsule by mouth daily.     pantoprazole (PROTONIX) 40 MG tablet Take 40 mg by mouth daily.     potassium chloride SA (K-DUR,KLOR-CON) 20 MEQ tablet Take 2 tablets (  40 mEq total) by mouth 2 (two) times daily.     rosuvastatin (CRESTOR) 20 MG tablet TAKE 1 TABLET BY MOUTH DAILY GENERIC EQUIVALENT FOR CRESTOR 90 tablet 1   Saccharomyces boulardii (PROBIOTIC) 250 MG CAPS Take 1 capsule by mouth daily.     Spacer/Aero-Holding Chambers (AEROCHAMBER MV) inhaler Use as instructed 1 each 0   triamcinolone (NASACORT) 55 MCG/ACT AERO nasal inhaler Place 2 sprays into the nose daily. 1 each 12   Turmeric 500 MG CAPS Take by mouth.     fluticasone-salmeterol (ADVAIR HFA) 115-21 MCG/ACT inhaler Inhale 2 puffs into the lungs 2 (two) times daily. 1 each 12   azelastine (ASTELIN) 0.1 % nasal spray Place 1 spray into both nostrils 2 (two) times daily. Use in each nostril as directed 30 mL 12   predniSONE (DELTASONE) 10 MG tablet 4 tabs for 2 days, then 3 tabs for 2 days, 2 tabs for 2 days, then 1 tab for 2 days, then stop 20 tablet 0   Facility-Administered Medications Prior to Visit  Medication  Dose Route Frequency Provider Last Rate Last Admin   lidocaine-EPINEPHrine (XYLOCAINE W/EPI) 1 %-1:100000 (with pres) injection 10 mL  10 mL Infiltration Once Croitoru, Mihai, MD       lidocaine-EPINEPHrine (XYLOCAINE W/EPI) 1 %-1:100000 (with pres) injection 15 mL  15 mL Infiltration Once Croitoru, Mihai, MD         Review of Systems:   Constitutional: No weight loss or gain, night sweats, fevers, chills, fatigue, or lassitude. HEENT: No headaches, difficulty swallowing, tooth/dental problems. No sneezing, ear ache +nasal congestion/drainage, postnasal drip, sore throat, eye redness and drainage, hoarseness CV:  No chest pain, orthopnea, PND, swelling in lower extremities, anasarca, dizziness, palpitations, syncope Resp: +shortness of breath with exertion; congested paroxysmal cough; wheezing. No hemoptysis. No chest wall deformity GI:  No heartburn, indigestion, abdominal pain, nausea, vomiting, diarrhea, change in bowel habits, loss of appetite, bloody stools.  Skin: No rash, lesions, ulcerations MSK:  No joint pain or swelling.  No decreased range of motion.  No back pain. Neuro: No dizziness or lightheadedness.  Psych: No depression or anxiety. Mood stable.     Physical Exam:  BP 126/72   Pulse 79   Ht '5\' 6"'$  (1.676 m)   Wt 206 lb (93.4 kg)   SpO2 99% Comment: on RA  BMI 33.25 kg/m   GEN: Pleasant, interactive, acutely-ill appearing; obese; in no acute distress. HEENT:  Normocephalic and atraumatic. EACs patent bilaterally. TM pearly gray with present light reflex bilaterally. PERRLA. Left sclera white. Right sclera injected; minimal amount of gray exudate. Nasal turbinates erythematous, moist and patent bilaterally. No rhinorrhea present. Oropharynx erythematous and moist, without exudate or edema. No lesions, ulcerations NECK:  Supple w/ fair ROM. No JVD present. Normal carotid impulses w/o bruits. Thyroid symmetrical with no goiter or nodules palpated. No lymphadenopathy.    CV: RRR, no m/r/g, no peripheral edema. Pulses intact, +2 bilaterally. No cyanosis, pallor or clubbing. PULMONARY:  Unlabored, regular breathing. Scattered rhonchi bilaterally A&P. Bronchitic cough. No accessory muscle use. No dullness to percussion. GI: BS present and normoactive. Soft, non-tender to palpation. No organomegaly or masses detected. No CVA tenderness. MSK: No erythema, warmth or tenderness. Cap refil <2 sec all extrem. No deformities or joint swelling noted.  Neuro: A/Ox3. No focal deficits noted.   Skin: Warm, no lesions or rashe Psych: Normal affect and behavior. Judgement and thought content appropriate.     Lab Results:  CBC  Component Value Date/Time   WBC 5.3 03/19/2019 1227   RBC 4.08 03/19/2019 1227   HGB 12.6 03/19/2019 1227   HGB 12.2 05/18/2015 1256   HGB 9.6 (L) 12/17/2007 1011   HCT 39.7 03/19/2019 1227   HCT 37.6 05/18/2015 1256   HCT 27.7 (L) 12/17/2007 1011   PLT 161 03/19/2019 1227   PLT 147 05/18/2015 1256   PLT 91 (L) 12/17/2007 1011   MCV 97.3 03/19/2019 1227   MCV 93 05/18/2015 1256   MCV 98.3 12/17/2007 1011   MCH 30.9 03/19/2019 1227   MCHC 31.7 03/19/2019 1227   RDW 12.7 03/19/2019 1227   RDW 13.4 05/18/2015 1256   RDW 19.7 (H) 12/17/2007 1011   LYMPHSABS 2.3 08/09/2018 1011   LYMPHSABS 1.1 05/18/2015 1256   LYMPHSABS 0.6 (L) 12/17/2007 1011   MONOABS 0.6 08/09/2018 1011   MONOABS 0.5 12/17/2007 1011   EOSABS 0.1 08/09/2018 1011   EOSABS 0.0 05/18/2015 1256   BASOSABS 0.1 08/09/2018 1011   BASOSABS 0.0 05/18/2015 1256   BASOSABS 0.0 12/17/2007 1011    BMET    Component Value Date/Time   NA 143 12/06/2021 1109   NA 142 05/18/2015 1257   K 4.1 12/06/2021 1109   K 3.6 05/18/2015 1257   CL 104 12/06/2021 1109   CL 104 01/15/2013 0844   CO2 25 12/06/2021 1109   CO2 26 05/18/2015 1257   GLUCOSE 103 (H) 12/06/2021 1109   GLUCOSE 67 (L) 08/24/2021 1512   GLUCOSE 90 05/18/2015 1257   GLUCOSE 103 01/15/2013 0844   BUN 9  12/06/2021 1109   BUN 12.6 05/18/2015 1257   CREATININE 0.91 12/06/2021 1109   CREATININE 0.8 05/18/2015 1257   CALCIUM 8.9 12/06/2021 1109   CALCIUM 9.1 05/18/2015 1257   GFRNONAA 65 01/30/2020 1241   GFRAA 75 01/30/2020 1241    BNP    Component Value Date/Time   BNP 110.8 (H) 12/06/2021 1109     Imaging:  DG Chest 2 View  Result Date: 02/10/2022 CLINICAL DATA:  Asthma exacerbation EXAM: CHEST - 2 VIEW COMPARISON:  Chest x-ray dated January 28, 2022 FINDINGS: The heart size and mediastinal contours are within normal limits. Both lungs are clear. Prior left total shoulder arthroplasty. IMPRESSION: No active cardiopulmonary disease. Electronically Signed   By: Yetta Glassman M.D.   On: 02/10/2022 15:48   DG Chest 2 View  Result Date: 01/20/2022 CLINICAL DATA:  Productive cough. Increased shortness of breath. Asthma exacerbation. EXAM: CHEST - 2 VIEW COMPARISON:  Chest two views 08/24/2021 FINDINGS: Cardiac silhouette is again mildly enlarged. Mediastinal contours are within normal limits. The lungs are clear. No pleural effusion or pneumothorax. Mild-to-moderate multilevel degenerative disc changes of the thoracic spine. Mild levocurvature at the thoracolumbar junction. Cholecystectomy clips. Reverse total left shoulder arthroplasty hardware. IMPRESSION: No active cardiopulmonary disease. Electronically Signed   By: Yvonne Kendall M.D.   On: 01/20/2022 11:08    methylPREDNISolone acetate (DEPO-MEDROL) injection 80 mg     Date Action Dose Route User   02/10/2022 1607 Given 80 mg Intramuscular (Left Deltoid) Valerie Salts, CMA          Latest Ref Rng & Units 09/03/2021    9:55 AM  PFT Results  FVC-Pre L 1.47   FVC-Predicted Pre % 57   FVC-Post L 1.65   FVC-Predicted Post % 65   Pre FEV1/FVC % % 83   Post FEV1/FCV % % 86   FEV1-Pre L 1.22   FEV1-Predicted Pre % 61  FEV1-Post L 1.42     Lab Results  Component Value Date   NITRICOXIDE 31 02/10/2022         Assessment & Plan:   Asthma Recurrent asthma exacerbation with elevated exhaled nitric oxide and acute bronchitis. Initially improved after prednisone and empiric doxycycline; however, has had worsening symptoms over the past 1-2 weeks. Depo 80 mg inj x 1 in office today. Extended prednisone taper. Recheck CXR to ensure she has not developed underlying pneumonia; if no evidence of superimposed infection, would hold off on further oral abx at this time. Cough control measures advised. Step up to high dose Advair. Continue PRN albuterol.   Patient Instructions  Continue Advair 2 puffs Twice daily. Brush tongue and rinse mouth afterwards  Continue Albuterol inhaler 2 puffs every 6 hours as needed for shortness of breath or wheezing. Notify if symptoms persist despite rescue inhaler/neb use.  Continue protonix 40 mg daily    -Prednisone taper. 4 tabs for 3 days, then 3 tabs for 3 days, 2 tabs for 3 days, then 1 tab for 3 days, then stop. Start tomorrow. Take in AM with food -Tessalon perles 1 capsule Three times a day as needed for cough -Mucinex DM Twice daily for cough/congestion  -Claritin 10 mg or other over the counter allergy medicine daily  -Erythromycin ointment - 1 application four times a day for 7 days. Please schedule an appointment with your eye doctor for further treatment  -Restart Astelin nasal spray 1-2 sprays each nostril Twice daily for nasal congestion/drainage -Restart Saline nasal spray 2-3 times a day    Chest x ray today. We will notify you of any abnormal results    Follow up in two weeks with Dr. Elsworth Soho or Joellen Jersey Cheyenna Pankowski,NP. If symptoms do not improve or worsen, please contact office for sooner follow up or seek emergency care.     Sinusitis, acute Possibly allergic in nature; however, previous allergen panel negative and she is on daily antihistamine. Recommended she restart astelin nasal spray Twice daily and continue nasocort. We can de-escalate once symptoms  improve. If she continues to have persistent symptoms and is off prednisone, we can consider CT sinus for further evaluation.   Bacterial conjunctivitis of right eye Unresolved. Previously treated with polytrim x 7 days; initially with some improvement but symptoms quickly returned after completion. No vision changes or eye pain. We will change her to erythromycin ointment qid x 7 days. Advised her to contact her ophthalmologist for further evaluation.     I spent 35 minutes of dedicated to the care of this patient on the date of this encounter to include pre-visit review of records, face-to-face time with the patient discussing conditions above, post visit ordering of testing, clinical documentation with the electronic health record, making appropriate referrals as documented, and communicating necessary findings to members of the patients care team.  Clayton Bibles, NP 02/10/2022  Pt aware and understands NP's role.

## 2022-02-10 NOTE — Assessment & Plan Note (Signed)
Unresolved. Previously treated with polytrim x 7 days; initially with some improvement but symptoms quickly returned after completion. No vision changes or eye pain. We will change her to erythromycin ointment qid x 7 days. Advised her to contact her ophthalmologist for further evaluation.

## 2022-02-10 NOTE — Assessment & Plan Note (Signed)
Recurrent asthma exacerbation with elevated exhaled nitric oxide and acute bronchitis. Initially improved after prednisone and empiric doxycycline; however, has had worsening symptoms over the past 1-2 weeks. Depo 80 mg inj x 1 in office today. Extended prednisone taper. Recheck CXR to ensure she has not developed underlying pneumonia; if no evidence of superimposed infection, would hold off on further oral abx at this time. Cough control measures advised. Step up to high dose Advair. Continue PRN albuterol.   Patient Instructions  Continue Advair 2 puffs Twice daily. Brush tongue and rinse mouth afterwards  Continue Albuterol inhaler 2 puffs every 6 hours as needed for shortness of breath or wheezing. Notify if symptoms persist despite rescue inhaler/neb use.  Continue protonix 40 mg daily    -Prednisone taper. 4 tabs for 3 days, then 3 tabs for 3 days, 2 tabs for 3 days, then 1 tab for 3 days, then stop. Start tomorrow. Take in AM with food -Tessalon perles 1 capsule Three times a day as needed for cough -Mucinex DM Twice daily for cough/congestion  -Claritin 10 mg or other over the counter allergy medicine daily  -Erythromycin ointment - 1 application four times a day for 7 days. Please schedule an appointment with your eye doctor for further treatment  -Restart Astelin nasal spray 1-2 sprays each nostril Twice daily for nasal congestion/drainage -Restart Saline nasal spray 2-3 times a day    Chest x ray today. We will notify you of any abnormal results    Follow up in two weeks with Dr. Elsworth Soho or Joellen Jersey Apolonio Cutting,NP. If symptoms do not improve or worsen, please contact office for sooner follow up or seek emergency care.

## 2022-02-10 NOTE — Patient Instructions (Addendum)
Continue Advair 2 puffs Twice daily. Brush tongue and rinse mouth afterwards  Continue Albuterol inhaler 2 puffs every 6 hours as needed for shortness of breath or wheezing. Notify if symptoms persist despite rescue inhaler/neb use.  Continue protonix 40 mg daily    -Prednisone taper. 4 tabs for 3 days, then 3 tabs for 3 days, 2 tabs for 3 days, then 1 tab for 3 days, then stop. Start tomorrow. Take in AM with food -Tessalon perles 1 capsule Three times a day as needed for cough -Mucinex DM Twice daily for cough/congestion  -Claritin 10 mg or other over the counter allergy medicine daily  -Erythromycin ointment - 1 application four times a day for 7 days. Please schedule an appointment with your eye doctor for further treatment  -Restart Astelin nasal spray 1-2 sprays each nostril Twice daily for nasal congestion/drainage -Restart Saline nasal spray 2-3 times a day    Chest x ray today. We will notify you of any abnormal results    Follow up in two weeks with Dr. Elsworth Soho or Joellen Jersey Erica Luba,NP. If symptoms do not improve or worsen, please contact office for sooner follow up or seek emergency care.

## 2022-02-10 NOTE — Assessment & Plan Note (Addendum)
Possibly allergic in nature; however, previous allergen panel negative and she is on daily antihistamine. Recommended she restart astelin nasal spray Twice daily and continue nasocort. We can de-escalate once symptoms improve. If she continues to have persistent symptoms and is off prednisone, we can consider CT sinus for further evaluation.

## 2022-02-15 DIAGNOSIS — C8300 Small cell B-cell lymphoma, unspecified site: Secondary | ICD-10-CM | POA: Diagnosis not present

## 2022-02-15 DIAGNOSIS — M064 Inflammatory polyarthropathy: Secondary | ICD-10-CM | POA: Diagnosis not present

## 2022-02-15 DIAGNOSIS — M3501 Sicca syndrome with keratoconjunctivitis: Secondary | ICD-10-CM | POA: Diagnosis not present

## 2022-02-15 DIAGNOSIS — M329 Systemic lupus erythematosus, unspecified: Secondary | ICD-10-CM | POA: Diagnosis not present

## 2022-02-24 ENCOUNTER — Telehealth: Payer: Self-pay | Admitting: Emergency Medicine

## 2022-02-24 NOTE — Telephone Encounter (Signed)
Sorry to hear she is not doing better. She needs to be evaluated in office. If we do not have any availability, see if her PCP can get her in to be seen tomorrow so they can look at her ears and throat. She may need ENT referral. If they have no availability, you can double book me. Thanks.

## 2022-02-24 NOTE — Telephone Encounter (Signed)
Called and spoke with pt who states she has had complaints for over a month now including cough where she is coughing up yellow to green phlegm and also states that she has pain on the left side of her throat especially when she inhales. Pt also states that there is soreness under the left ear in the jaw line.  Pt said that she has had sinus problems like this before. States at night when trying to sleep, she has to cover her head at night to try to keep the air from getting in her ear.  Asked pt if she has an ENT and she stated that she did not. Pt wants recommendations to see what might could be done to help her out. Katie, please advise.

## 2022-02-24 NOTE — Telephone Encounter (Signed)
Called and spoke with pt letting her know recs per [facility] and she verbalized understanding. Appt scheduled for pt to see TP for an acute visit tomorrow. Nothing further needed.

## 2022-02-24 NOTE — Telephone Encounter (Signed)
Pharmacy is [facility] on [address]

## 2022-02-25 ENCOUNTER — Encounter: Payer: Self-pay | Admitting: Adult Health

## 2022-02-25 ENCOUNTER — Ambulatory Visit: Payer: Medicare Other | Admitting: Adult Health

## 2022-02-25 DIAGNOSIS — J4540 Moderate persistent asthma, uncomplicated: Secondary | ICD-10-CM

## 2022-02-25 DIAGNOSIS — J0191 Acute recurrent sinusitis, unspecified: Secondary | ICD-10-CM

## 2022-02-25 MED ORDER — AMOXICILLIN-POT CLAVULANATE 875-125 MG PO TABS: 1.0000 | ORAL_TABLET | Freq: Two times a day (BID) | ORAL | 0 refills | Status: DC

## 2022-02-25 NOTE — Progress Notes (Signed)
$'@Patient'X$  ID: [name], female    DOB: May 19, 1951, 71 y.o.   MRN: [id number]  Chief Complaint  Patient presents with   Follow-up    Referring provider: Merrilee Seashore, MD  HPI: 71 year old female former smoker followed for asthma Medical history significant for congestive heart failure, lymphoma, lupus  TEST/EVENTS :  11/2016 CT abdomen: Shows mild chronic bibasilar pulmonary scarring, likely postinflammatory 08/2021 PFTs: FEV1 61%, FVC 57%, ratio 83%, 17% improvement in FVC with albuterol. 01/20/2022 CXR 2 view: lungs are clear.   02/25/2022 Follow up : Asthma  Patient returns for persistent symptoms.  Patient was seen 2 weeks ago for ongoing cough has been present for over 6 weeks.  She was treated for a slow to resolve asthma exacerbation.  She had initially treated with doxycycline and prednisone.  Last visit she was given a Depo-Medrol injection in the office.  And a prednisone taper.  Patient says that symptoms did not improve some but she continues to have ongoing nasal congestion sinus pain and pressure ear fullness.  Feels that her sinuses are stopped up.  She is blowing out thick mucus.  She denies any hemoptysis, chest pain, orthopnea, edema.  Chest x-ray last visit showed clear lungs. She remains on Advair twice daily.  Says that she has increased her albuterol use some. Says she has chronic right eye irritation.  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   Allergies  Allergen Reactions   Iodinated Contrast Media Other (See Comments)    Kidney failure.    Ciprofloxacin Itching   E-Mycin [Erythromycin Base] Nausea And Vomiting   Oxycodone Other (See Comments)    Hallucination.    Immunization History  Administered Date(s) Administered   Influenza, High Dose Seasonal PF 06/11/2014, 04/14/2015, 05/16/2016   Influenza, Quadrivalent, Recombinant, Inj, Pf 03/21/2017, 04/10/2018, 02/22/2019, 04/10/2020, 05/04/2021    Influenza-Unspecified 06/08/2011, 04/05/2012, 05/18/2012, 05/30/2012, 05/24/2013   PFIZER(Purple Top)SARS-COV-2 Vaccination 08/16/2019, 09/10/2019, 04/03/2020   PNEUMOCOCCAL CONJUGATE-20 11/09/2021   Pneumococcal Conjugate-13 09/12/2017   Pneumococcal Polysaccharide-23 05/27/2002, 10/17/2012, 08/17/2018, 10/11/2019   Tdap 04/05/2012   Zoster Recombinat (Shingrix) 11/02/2021    Past Medical History:  Diagnosis Date   Acid reflux    Arthritis    Asthma    Carotid stenosis 10/29/2020   Chronic diastolic heart failure ([place]) 08/30/2019   Gait instability 10/29/2020   Hypertension    Lupus ([place]) 07/13/2011   Malignant lymphoma, 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     albuterol (VENTOLIN HFA) 108 (90 Base) MCG/ACT inhaler Inhale 2 puffs into the lungs every 6 (six) hours as needed for wheezing or shortness of breath. 8 g 6   aspirin 81 MG chewable tablet Chew by mouth daily.     Biotin 1000 MCG tablet Take 1,000 mcg by mouth daily.  cholecalciferol (VITAMIN D) 1000 UNITS tablet Take 1,000 Units by mouth daily.      fluticasone-salmeterol (ADVAIR HFA) 230-21 MCG/ACT inhaler Inhale 2 puffs into the lungs 2 (two) times daily. 1 each 5   furosemide (LASIX) 80 MG tablet TAKE ONE AND ONE-HALF TABLETS BY MOUTH DAILY. NEW DOSE 135 tablet 1   hydroxychloroquine (PLAQUENIL) 200 MG tablet Take 200 mg by mouth  daily.     latanoprost (XALATAN) 0.005 % ophthalmic solution Place 1 drop into both eyes at bedtime.     losartan (COZAAR) 50 MG tablet Take 1 tablet (50 mg total) by mouth daily. 90 tablet 3   Omega-3 Fatty Acids (OMEGA 3 500 PO) Take 1 capsule by mouth daily.     pantoprazole (PROTONIX) 40 MG tablet Take 40 mg by mouth daily.     potassium chloride SA (K-DUR,KLOR-CON) 20 MEQ tablet Take 2 tablets (40 mEq total) by mouth 2 (two) times daily.     rosuvastatin (CRESTOR) 20 MG tablet TAKE 1 TABLET BY MOUTH DAILY GENERIC EQUIVALENT FOR CRESTOR 90 tablet 1   Saccharomyces boulardii (PROBIOTIC) 250 MG CAPS Take 1 capsule by mouth daily.     Spacer/Aero-Holding Chambers (AEROCHAMBER MV) inhaler Use as instructed 1 each 0   triamcinolone (NASACORT) 55 MCG/ACT AERO nasal inhaler Place 2 sprays into the nose daily. 1 each 12   Turmeric 500 MG CAPS Take by mouth.     benzonatate (TESSALON) 200 MG capsule Take 1 capsule (200 mg total) by mouth 3 (three) times daily as needed for cough. (Patient not taking: Reported on 02/25/2022) 30 capsule 1   predniSONE (DELTASONE) 10 MG tablet 4 tabs for 3 days, then 3 tabs for 3 days, 2 tabs for 3 days, then 1 tab for 3 days, then stop (Patient not taking: Reported on 02/25/2022) 30 tablet 0   Facility-Administered Medications Prior to Visit  Medication Dose Route Frequency Provider Last Rate Last Admin   lidocaine-EPINEPHrine (XYLOCAINE W/EPI) 1 %-1:100000 (with pres) injection 10 mL  10 mL Infiltration Once Croitoru, Mihai, MD       lidocaine-EPINEPHrine (XYLOCAINE W/EPI) 1 %-1:100000 (with pres) injection 15 mL  15 mL Infiltration Once Croitoru, Mihai, MD         Review of Systems:   Constitutional:   No  weight loss, night sweats,  Fevers, chills, fatigue, or  lassitude.  HEENT:   No headaches,  Difficulty swallowing,  Tooth/dental problems, or  Sore throat,                No sneezing, itching, ear ache,  +nasal congestion, post nasal drip,   CV:  No chest  pain,  Orthopnea, PND, swelling in lower extremities, anasarca, dizziness, palpitations, syncope.   GI  No heartburn, indigestion, abdominal pain, nausea, vomiting, diarrhea, change in bowel habits, loss of appetite, bloody stools.   Resp:  No chest wall deformity  Skin: no rash or lesions.  GU: no dysuria, change in color of urine, no urgency or frequency.  No flank pain, no hematuria   MS:  No joint pain or swelling.  No decreased range of motion.  No back pain.    Physical Exam  BP 130/78 (BP Location: Right Arm, Cuff Size: Large)   Pulse 69   Ht '5\' 6"'$  (1.676 m)   Wt 203 lb 3.2 oz (92.2 kg)   SpO2 97%   BMI 32.80 kg/m   GEN: A/Ox3; pleasant , NAD, well nourished    HEENT:  [place]/AT,  NOSE-clear,  THROAT-clear, no lesions, no postnasal drip or exudate noted.  Maxillary sinus tenderness, left eye redness   NECK:  Supple w/ fair ROM; no JVD; normal carotid impulses w/o bruits; no thyromegaly or nodules palpated; no lymphadenopathy.    RESP  Clear  P & A; w/o, wheezes/ rales/ or rhonchi. no accessory muscle use, no dullness to percussion  CARD:  RRR, no m/r/g, no peripheral edema, pulses intact, no cyanosis or clubbing.  GI:   Soft & nt; nml bowel sounds; no organomegaly or masses detected.   Musco: Warm bil, no deformities or joint swelling noted.   Neuro: alert, no focal deficits noted.    Skin: Warm, no lesions or rashes    Lab Results:    BNP   Imaging: DG Chest 2 View  Result Date: 02/10/2022 CLINICAL DATA:  Asthma exacerbation EXAM: CHEST - 2 VIEW COMPARISON:  Chest x-ray dated January 28, 2022 FINDINGS: The heart size and mediastinal contours are within normal limits. Both lungs are clear. Prior left total shoulder arthroplasty. IMPRESSION: No active cardiopulmonary disease. Electronically Signed   By: Yetta Glassman M.D.   On: 02/10/2022 15:48    methylPREDNISolone acetate (DEPO-MEDROL) injection 80 mg     Date Action Dose Route User   02/10/2022 1607  Given 80 mg Intramuscular (Left Deltoid) Valerie Salts, CMA          Latest Ref Rng & Units 09/03/2021    9:55 AM  PFT Results  FVC-Pre L 1.47   FVC-Predicted Pre % 57   FVC-Post L 1.65   FVC-Predicted Post % 65   Pre FEV1/FVC % % 83   Post FEV1/FCV % % 86   FEV1-Pre L 1.22   FEV1-Predicted Pre % 61   FEV1-Post L 1.42     Lab Results  Component Value Date   NITRICOXIDE 31 02/10/2022        Assessment & Plan:   Asthma Recent slow to resolve asthmatic bronchitic flare.  Symptoms seem to be improved but she has some residual sinus symptoms which may be a underlying sinusitis.  Plan  . Patient Instructions  Augmentin '875mg'$  Twice daily  for 10 days-take with food  Eat yogurt daily .  Robitussin DM 2 tsp every 4hrs as needed  Tessalon Three times a day  for cough As needed   Saline nasal rinses Twice daily   Warm compress to sinus  Continue on Adviar Twice daily  ,rinse after use  Albuterol inhaler As needed  Follow up with Dr. Elsworth Soho  in 6 weeks and As needed   Please contact office for sooner follow up if symptoms do not improve or worsen or seek emergency care       Acute sinusitis Acute sinusitis.  We will treat with a 10-day course of antibiotics. Add in saline nasal rinses.  Plan  Patient Instructions  Augmentin '875mg'$  Twice daily  for 10 days-take with food  Eat yogurt daily .  Robitussin DM 2 tsp every 4hrs as needed  Tessalon Three times a day  for cough As needed   Saline nasal rinses Twice daily   Warm compress to sinus  Continue on Adviar Twice daily  ,rinse after use  Albuterol inhaler As needed  Follow up with Dr. Elsworth Soho  in 6 weeks and As needed   Please contact office for sooner follow up if symptoms do not improve or worsen or seek emergency care         Rexene Edison, NP 02/25/2022

## 2022-02-25 NOTE — Patient Instructions (Addendum)
Augmentin '875mg'$  Twice daily  for 10 days-take with food  Eat yogurt daily .  Robitussin DM 2 tsp every 4hrs as needed  Tessalon Three times a day  for cough As needed   Saline nasal rinses Twice daily   Warm compress to sinus  Continue on Adviar Twice daily  ,rinse after use  Albuterol inhaler As needed  Follow up with Dr. Elsworth Soho  in 6 weeks and As needed   Please contact office for sooner follow up if symptoms do not improve or worsen or seek emergency care

## 2022-02-25 NOTE — Assessment & Plan Note (Signed)
Recent slow to resolve asthmatic bronchitic flare.  Symptoms seem to be improved but she has some residual sinus symptoms which may be a underlying sinusitis.  Plan  . Patient Instructions  Augmentin '875mg'$  Twice daily  for 10 days-take with food  Eat yogurt daily .  Robitussin DM 2 tsp every 4hrs as needed  Tessalon Three times a day  for cough As needed   Saline nasal rinses Twice daily   Warm compress to sinus  Continue on Adviar Twice daily  ,rinse after use  Albuterol inhaler As needed  Follow up with Dr. Elsworth Soho  in 6 weeks and As needed   Please contact office for sooner follow up if symptoms do not improve or worsen or seek emergency care

## 2022-02-25 NOTE — Assessment & Plan Note (Signed)
Acute sinusitis.  We will treat with a 10-day course of antibiotics. Add in saline nasal rinses.  Plan  Patient Instructions  Augmentin '875mg'$  Twice daily  for 10 days-take with food  Eat yogurt daily .  Robitussin DM 2 tsp every 4hrs as needed  Tessalon Three times a day  for cough As needed   Saline nasal rinses Twice daily   Warm compress to sinus  Continue on Adviar Twice daily  ,rinse after use  Albuterol inhaler As needed  Follow up with Dr. Elsworth Soho  in 6 weeks and As needed   Please contact office for sooner follow up if symptoms do not improve or worsen or seek emergency care

## 2022-02-28 ENCOUNTER — Ambulatory Visit: Payer: Medicare Other | Admitting: Nurse Practitioner

## 2022-03-08 ENCOUNTER — Other Ambulatory Visit: Payer: Self-pay | Admitting: Cardiovascular Disease

## 2022-03-09 NOTE — Telephone Encounter (Signed)
Rx(s) sent to pharmacy electronically.  

## 2022-03-21 DIAGNOSIS — M25571 Pain in right ankle and joints of right foot: Secondary | ICD-10-CM | POA: Diagnosis not present

## 2022-03-21 DIAGNOSIS — M25572 Pain in left ankle and joints of left foot: Secondary | ICD-10-CM | POA: Diagnosis not present

## 2022-03-21 DIAGNOSIS — M79672 Pain in left foot: Secondary | ICD-10-CM | POA: Diagnosis not present

## 2022-03-21 DIAGNOSIS — M79671 Pain in right foot: Secondary | ICD-10-CM | POA: Diagnosis not present

## 2022-03-22 DIAGNOSIS — M359 Systemic involvement of connective tissue, unspecified: Secondary | ICD-10-CM | POA: Diagnosis not present

## 2022-03-22 DIAGNOSIS — M79643 Pain in unspecified hand: Secondary | ICD-10-CM | POA: Diagnosis not present

## 2022-03-22 DIAGNOSIS — M3501 Sicca syndrome with keratoconjunctivitis: Secondary | ICD-10-CM | POA: Diagnosis not present

## 2022-03-22 DIAGNOSIS — Z79899 Other long term (current) drug therapy: Secondary | ICD-10-CM | POA: Diagnosis not present

## 2022-03-23 DIAGNOSIS — H1031 Unspecified acute conjunctivitis, right eye: Secondary | ICD-10-CM | POA: Diagnosis not present

## 2022-03-23 DIAGNOSIS — H16121 Filamentary keratitis, right eye: Secondary | ICD-10-CM | POA: Diagnosis not present

## 2022-03-30 ENCOUNTER — Other Ambulatory Visit: Payer: Self-pay | Admitting: Cardiovascular Disease

## 2022-03-30 NOTE — Telephone Encounter (Signed)
Rx(s) sent to pharmacy electronically.  

## 2022-04-05 DIAGNOSIS — H16121 Filamentary keratitis, right eye: Secondary | ICD-10-CM | POA: Diagnosis not present

## 2022-04-05 DIAGNOSIS — H04121 Dry eye syndrome of right lacrimal gland: Secondary | ICD-10-CM | POA: Diagnosis not present

## 2022-04-12 ENCOUNTER — Encounter: Payer: Self-pay | Admitting: Pulmonary Disease

## 2022-04-12 ENCOUNTER — Ambulatory Visit: Payer: Medicare Other | Admitting: Pulmonary Disease

## 2022-04-12 VITALS — BP 130/70 | HR 66 | Ht 66.0 in | Wt 206.0 lb

## 2022-04-12 DIAGNOSIS — Z96643 Presence of artificial hip joint, bilateral: Secondary | ICD-10-CM | POA: Diagnosis not present

## 2022-04-12 DIAGNOSIS — J4540 Moderate persistent asthma, uncomplicated: Secondary | ICD-10-CM | POA: Diagnosis not present

## 2022-04-12 DIAGNOSIS — I5032 Chronic diastolic (congestive) heart failure: Secondary | ICD-10-CM

## 2022-04-12 DIAGNOSIS — Z96642 Presence of left artificial hip joint: Secondary | ICD-10-CM | POA: Diagnosis not present

## 2022-04-12 DIAGNOSIS — Z23 Encounter for immunization: Secondary | ICD-10-CM | POA: Diagnosis not present

## 2022-04-12 NOTE — Assessment & Plan Note (Signed)
She seems to have responded to treatment for asthmatic bronchitis with prednisone and antibiotic in August.  She is compliant with Advair HFA and we will continue this at this time. She will continue on Nasacort for nasal allergies. I also asked her to take OTC antihistaminic such as Zyrtec or Claritin during change of seasons

## 2022-04-12 NOTE — Patient Instructions (Signed)
X flu shot  Take OTC claritin or zyrtec until mid Nov

## 2022-04-12 NOTE — Progress Notes (Signed)
   Subjective:    Patient ID: [name], female    DOB: June 22, 1951, 71 y.o.   MRN: [id number]  HPI  71 yo remote smoker for FU of shortness of breath.   PMH -hypertension, HFpEF Lymphoma 2008 SLE on Plaquenil ( sayed )  Syncope, has a loop recorder   She smoked less than 10 pack years starting in her 82s and quit in 1995. She reports a diagnosis of asthma many years ago Dr. Velora Heckler gave her albuterol, triggers being odors, seasonal allergies.  PCP gave her Symbicort in July 2022.   Initial OV 08/2021  relief with Symbicort seems to suggest asthma as a possible etiology but sudden onset and lack of wheezing points otherwise Given reversibility on PFTs reasonable to treat as asthma.  Symbicort is expensive and switched to generic Advair   OV 8/25 seen 2 weeks ago for ongoing cough has been present for over 6 weeks.  She was treated for a slow to resolve asthma exacerbation.  She had initially treated with doxycycline and prednisone.  Last visit she was given a Depo-Medrol injection in the office.  And a prednisone taper.  Patient says that symptoms did not improve some but she continues to have ongoing nasal congestion sinus pain and pressure ear fullness >> slow to resolve asthmatic bronchitic flare, residual sinus symptoms  >>augmentin    Chief Complaint  Patient presents with   Follow-up    Pt states that she has problems with rhinitis. States she still has an occasional cough and wheeze.   She improved after the course of Augmentin.  She is compliant with Advair HFA. She reports that her nose runs all the time.  She also reports constant sniffing. She has been using Nasacort.  She reports dry eye for 3 years and chronic redness in the right eye Chest x-ray 02/10/2022 appears clear  Significant tests/ events reviewed  BNP 213 RAST panel negative, IgE less than 2  08/2021 Spirometry shows moderate restriction with some bronchodilator response.  Ratio 83, FEV1 61%, FVC 57%,  17% improvement in FVC with albuterol   CT abdomen 11/2016 in 2015 shows mild chronic bibasilar pulmonary scarring  Review of Systems neg for any significant sore throat, dysphagia, itching, sneezing, nasal congestion or excess/ purulent secretions, fever, chills, sweats, unintended wt loss, pleuritic or exertional cp, hempoptysis, orthopnea pnd or change in chronic leg swelling. Also denies presyncope, palpitations, heartburn, abdominal pain, nausea, vomiting, diarrhea or change in bowel or urinary habits, dysuria,hematuria, rash, arthralgias, visual complaints, headache, numbness weakness or ataxia.     Objective:   Physical Exam  Gen. Pleasant, obese, in no distress ENT - no lesions, no post nasal drip, redness right eye Neck: No JVD, no thyromegaly, no carotid bruits Lungs: no use of accessory muscles, no dullness to percussion, decreased without rales or rhonchi  Cardiovascular: Rhythm regular, heart sounds  normal, no murmurs or gallops, no peripheral edema Musculoskeletal: No deformities, no cyanosis or clubbing , no tremors        Assessment & Plan:

## 2022-04-12 NOTE — Assessment & Plan Note (Signed)
Continue Lasix daily. Flu shot

## 2022-04-13 DIAGNOSIS — H04123 Dry eye syndrome of bilateral lacrimal glands: Secondary | ICD-10-CM | POA: Diagnosis not present

## 2022-04-13 DIAGNOSIS — H16121 Filamentary keratitis, right eye: Secondary | ICD-10-CM | POA: Diagnosis not present

## 2022-04-21 DIAGNOSIS — M5451 Vertebrogenic low back pain: Secondary | ICD-10-CM | POA: Diagnosis not present

## 2022-04-21 DIAGNOSIS — M25552 Pain in left hip: Secondary | ICD-10-CM | POA: Diagnosis not present

## 2022-04-27 DIAGNOSIS — H16121 Filamentary keratitis, right eye: Secondary | ICD-10-CM | POA: Diagnosis not present

## 2022-04-27 DIAGNOSIS — H04121 Dry eye syndrome of right lacrimal gland: Secondary | ICD-10-CM | POA: Diagnosis not present

## 2022-05-04 DIAGNOSIS — H04123 Dry eye syndrome of bilateral lacrimal glands: Secondary | ICD-10-CM | POA: Diagnosis not present

## 2022-05-04 DIAGNOSIS — H16121 Filamentary keratitis, right eye: Secondary | ICD-10-CM | POA: Diagnosis not present

## 2022-05-16 DIAGNOSIS — M545 Low back pain, unspecified: Secondary | ICD-10-CM | POA: Diagnosis not present

## 2022-05-16 DIAGNOSIS — M5416 Radiculopathy, lumbar region: Secondary | ICD-10-CM | POA: Diagnosis not present

## 2022-05-18 DIAGNOSIS — H16121 Filamentary keratitis, right eye: Secondary | ICD-10-CM | POA: Diagnosis not present

## 2022-05-24 DIAGNOSIS — M545 Low back pain, unspecified: Secondary | ICD-10-CM | POA: Diagnosis not present

## 2022-05-30 DIAGNOSIS — M329 Systemic lupus erythematosus, unspecified: Secondary | ICD-10-CM | POA: Diagnosis not present

## 2022-05-30 DIAGNOSIS — H40003 Preglaucoma, unspecified, bilateral: Secondary | ICD-10-CM | POA: Diagnosis not present

## 2022-05-30 DIAGNOSIS — Z79899 Other long term (current) drug therapy: Secondary | ICD-10-CM | POA: Diagnosis not present

## 2022-05-30 DIAGNOSIS — H119 Unspecified disorder of conjunctiva: Secondary | ICD-10-CM | POA: Diagnosis not present

## 2022-05-30 DIAGNOSIS — H16121 Filamentary keratitis, right eye: Secondary | ICD-10-CM | POA: Diagnosis not present

## 2022-05-31 ENCOUNTER — Encounter (HOSPITAL_BASED_OUTPATIENT_CLINIC_OR_DEPARTMENT_OTHER): Payer: Self-pay | Admitting: Cardiovascular Disease

## 2022-05-31 ENCOUNTER — Ambulatory Visit (HOSPITAL_BASED_OUTPATIENT_CLINIC_OR_DEPARTMENT_OTHER): Payer: Medicare Other | Admitting: Cardiovascular Disease

## 2022-05-31 VITALS — BP 130/72 | HR 62 | Ht 66.0 in | Wt 211.2 lb

## 2022-05-31 DIAGNOSIS — I5032 Chronic diastolic (congestive) heart failure: Secondary | ICD-10-CM

## 2022-05-31 DIAGNOSIS — I10 Essential (primary) hypertension: Secondary | ICD-10-CM

## 2022-05-31 DIAGNOSIS — R0609 Other forms of dyspnea: Secondary | ICD-10-CM | POA: Diagnosis not present

## 2022-05-31 DIAGNOSIS — E7800 Pure hypercholesterolemia, unspecified: Secondary | ICD-10-CM | POA: Diagnosis not present

## 2022-05-31 HISTORY — DX: Pure hypercholesterolemia, unspecified: E78.00

## 2022-05-31 NOTE — Assessment & Plan Note (Signed)
Blood pressure has been well-controlled lately.  Continue losartan.

## 2022-05-31 NOTE — Assessment & Plan Note (Signed)
[name] has been experiencing progressive exertional dyspnea.  She does not appear volume overloaded on exam.  She does have diastolic dysfunction.  We will get an echocardiogram.  We will also get a coronary CTA as above.  Blood pressures well controlled as above.  Continue losartan and Lasix.  Check a CMP today.  Limit fluids to 64 ounces.

## 2022-05-31 NOTE — Patient Instructions (Addendum)
Medication Instructions:  Your physician recommends that you continue on your current medications as directed. Please refer to the Current Medication list given to you today.   *If you need a refill on your cardiac medications before your next appointment, please call your pharmacy*  Lab Work: LP/CMET/CBC TODAY   If you have labs (blood work) drawn today and your tests are completely normal, you will receive your results only by: Eva (if you have MyChart) OR A paper copy in the mail If you have any lab test that is abnormal or we need to change your treatment, we will call you to review the results.  Testing/Procedures: Your physician has requested that you have an echocardiogram. Echocardiography is a painless test that uses sound waves to create images of your heart. It provides your doctor with information about the size and shape of your heart and how well your heart's chambers and valves are working. This procedure takes approximately one hour. There are no restrictions for this procedure. Please do NOT wear cologne, perfume, aftershave, or lotions (deodorant is allowed). Please arrive 15 minutes prior to your appointment time.  Your physician has requested that you have a lexiscan myoview. For further information please visit [email or web address]. Please follow instruction sheet, as given.  Follow-Up: At [facility], you and your health needs are our priority.  As part of our continuing mission to provide you with exceptional heart care, we have created designated Provider Care Teams.  These Care Teams include your primary Cardiologist (physician) and Advanced Practice Providers (APPs -  Physician Assistants and Nurse Practitioners) who all work together to provide you with the care you need, when you need it.  We recommend signing up for the patient portal called "MyChart".  Sign up information is provided on this After Visit Summary.  MyChart is used to connect  with patients for Virtual Visits (Telemedicine).  Patients are able to view lab/test results, encounter notes, upcoming appointments, etc.  Non-urgent messages can be sent to your provider as well.   To learn more about what you can do with MyChart, go to [email or web address].    Your next appointment:   2 month(s)  The format for your next appointment:   In Person  Provider:   Skeet Latch, MD or Laurann Montana, NP

## 2022-05-31 NOTE — Assessment & Plan Note (Addendum)
[name] has been feeling very tired with limited physical activity.  She gets short of breath just walking around her home.  She has no edema, orthopnea or PND.  We will get an echo and cardiac CT to assess for ischemia or evidence of heart failure.  If these are negative, will refer to PREP.  She also notes that there is some concern about lymphoma in her eye.  She is following up with ophthalmology and has a biopsy pending.  Check a CBC.

## 2022-05-31 NOTE — Progress Notes (Signed)
Cardiology Office Note  Date:  05/31/2022   ID:  [name], DOB [birth year], MRN [id number]  PCP:  Merrilee Seashore, MD  Cardiologist:   Skeet Latch, MD   No chief complaint on file.    History of Present Illness: [name] is a 71 y.o. female with prior stroke, rheumatoid arthritis, lupus, interstitial lung disease, hypertension, prior lymphoplasmacytoid lymphoma, and asthma here for follow up.  She was initially seen in the ED 04/2018 with chest pain.  She reported L shoulder and chest wall pain.  EKG revealed diffuse T wave inversions.  Cardiac enzymes were negative x3.  She was referred for outpatient stress testing and had a Lexiscan Myoview 05/07/2018 that revealed LVEF 68%.  There was no ischemia.  She previously had an echo 01/2018 that revealed LVEF 60 to 65% with grade 2 diastolic dysfunction.    [name] had an episode of near syncope 03/2019.  Since then metoprolol was stopped.  She saw Dr. Loletha Grayer in and had an implantable loop monitor placed 12/2019.  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  She denied any recurrent syncope since that time. She had another fall 08/2020 and didn't know what happened.  There were no associated events on her ILR.  She had another fall 02/2021.  She was walking into the bathroom and turned her head.  When she turned her head back and when she started talking she fell.  There was no preceding lightheadedness or dizziness.  She had no chest pain or shortness  of breath and did not lose consciousness.  She had surgery on her right hip 03/2021.    She was recovering from her right hip surgery and in PT. She was able to walk with a cane and noted only some swelling in her right leg. She reported well controlled blood pressures at home. She has not had any arrhythmias on her ILR.  At the last visit, she was taking some extra lasix due to swelling after her hip replacement but was otherwise doing well. Her blood pressure was low at times  so losartan was reduced. She had her loop recorded extracted 12/2021.   Today, she complains of shortness of breath with minimal exertion and fatigue beginning several months ago. She denies shortness of breath while lying down but reports that activities like getting up to go to the bathroom will cause her to be short of breath. She has not been sleeping well due to waking up often many nights of the week. She continues to try to stay active like going to the store and walking around. She reports that she has been having a low appetite and not drinking much water in a day. She also reports that she sometimes feels lightheaded, but notes that it is not more than usual. She has been monitoring her blood pressure at home using her watch. Her blood pressure ranges from 638L systolic and in the 37D-42A diastolic. She also complains of pain in her left shoulder. She saw her ophthalmologist recently and was told that she may have recurrent lymphomas.  She goes for a biopsy next week.She endorses night sweats, causing her to wake up with a soaked shirt and wet hair. She notes that it is nothing new for her. She denies any palpitations, chest pain or peripheral edema. No headaches, syncope, orthopnea, or PND.   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[place]) 07/13/2011   Near syncope 08/30/2019   PONV (postoperative nausea and vomiting)    Pure hypercholesterolemia 05/31/2022   Stroke ([place])    lt side weakness    Past Surgical History:  Procedure Laterality Date   ABDOMINAL HYSTERECTOMY     BACK SURGERY     CHOLECYSTECTOMY     COLONOSCOPY     FOOT SURGERY     x2   MASS EXCISION     back   rortator     rotator cuff   TOTAL SHOULDER ARTHROPLASTY Left 08/16/2018    Procedure: LEFT SHOULDER REVERSE TOTAL SHOULDER ARTHROPLASTY AND BICEPS TENOTOMY;  Surgeon: Tania Ade, MD;  Location: [facility];  Service: Orthopedics;  Laterality: Left;     Current Outpatient Medications  Medication Sig Dispense Refill   acetaminophen (TYLENOL) 500 MG tablet Take 1,000 mg by mouth every 6 (six) hours as needed for mild pain or headache.     albuterol (VENTOLIN HFA) 108 (90 Base) MCG/ACT inhaler Inhale 2 puffs into the lungs every 6 (six) hours as needed for wheezing or shortness of breath. 8 g 6   aspirin 81 MG chewable tablet Chew by mouth daily.     Biotin 1000 MCG tablet Take 1,000 mcg by mouth daily.     cholecalciferol (VITAMIN D) 1000 UNITS tablet Take 1,000 Units by mouth daily.      fluorometholone (FML) 0.1 % ophthalmic suspension Place 1 drop into the right eye 2 (two) times daily.     fluticasone-salmeterol (ADVAIR HFA) 230-21 MCG/ACT inhaler Inhale 2 puffs into the lungs 2 (two) times daily. 1 each 5   furosemide (LASIX) 80 MG tablet TAKE ONE AND ONE-HALF TABLETS BY MOUTH DAILY. NEW DOSE 135 tablet 1   hydroxychloroquine (PLAQUENIL) 200 MG tablet Take 200 mg by mouth daily.     latanoprost (XALATAN) 0.005 % ophthalmic solution Place 1 drop into both eyes at bedtime.     losartan (COZAAR) 50 MG tablet Take 1 tablet (50 mg total) by mouth daily. 90 tablet 3   Omega-3 Fatty Acids (OMEGA 3 500 PO) Take 1 capsule by mouth daily.     pantoprazole (PROTONIX) 40 MG tablet Take 40 mg by mouth daily.     potassium chloride SA (K-DUR,KLOR-CON) 20 MEQ tablet Take 2 tablets (40 mEq total) by mouth 2 (two) times daily. (Patient taking differently: Take 20 mEq by mouth 2 (two) times daily. Take 2 in the AM and 1 in the PM)     rosuvastatin (CRESTOR) 20 MG tablet TAKE 1 TABLET BY MOUTH DAILY 90 tablet 2   Saccharomyces boulardii (PROBIOTIC) 250 MG CAPS Take 1 capsule by mouth daily.     Spacer/Aero-Holding Chambers (AEROCHAMBER MV) inhaler Use as instructed 1 each 0    traMADol (ULTRAM) 50 MG tablet Take 50 mg by mouth every 8 (eight) hours as needed.     triamcinolone (NASACORT) 55 MCG/ACT AERO nasal inhaler Place 2 sprays into the nose daily. 1 each 12   trimethoprim-polymyxin b (POLYTRIM) ophthalmic solution Place 1 drop into the right eye in the morning and at bedtime.     Turmeric 500 MG CAPS Take by mouth.     Current Facility-Administered Medications  Medication Dose Route Frequency Provider Last Rate Last Admin   lidocaine-EPINEPHrine (XYLOCAINE W/EPI) 1 %-1:100000 (with pres) injection  10 mL  10 mL Infiltration Once Croitoru, Mihai, MD       lidocaine-EPINEPHrine (XYLOCAINE W/EPI) 1 %-1:100000 (with pres) injection 15 mL  15 mL Infiltration Once Croitoru, Mihai, MD        Allergies:   Iodinated contrast media, Ciprofloxacin, E-mycin [erythromycin base], and Oxycodone    Social History:  The patient  reports that she quit smoking about 27 years ago. Her smoking use included cigarettes. She has a 10.00 pack-year smoking history. She has been exposed to tobacco smoke. She has never used smokeless tobacco. She reports that she does not currently use alcohol. She reports that she does not use drugs.   Family History:  The patient's family history includes Coronary artery disease in her brother; Lung disease in her father.   ROS:   Please see the history of present illness. (+) Shortness of pain (worse with minimal exertion) (+) Fatigue (+) Lightheadedness (+) Left shoulder pain (+) Night sweats (+) Low appetite All other systems are reviewed and negative.    PHYSICAL EXAM:  VS:  BP 130/72 (BP Location: Left Arm, Patient Position: Sitting, Cuff Size: Large)   Pulse 62   Ht '5\' 6"'$  (1.676 m)   Wt 211 lb 3.2 oz (95.8 kg)   BMI 34.09 kg/m  , BMI Body mass index is 34.09 kg/m. GENERAL:  Well appearing HEENT: R eye proptosis.  Medial conjuntival injection NECK:  No jugular venous distention, waveform within normal limits, carotid upstroke brisk  and symmetric, no bruits.  R clavicular head enlarged LUNGS:  Clear to auscultation bilaterally HEART:   RRR.  PMI not displaced or sustained,S1 and S2 within normal limits, no S3, no S4, no clicks, no rubs, no murmurs ABD:  Flat, positive bowel sounds normal in frequency in pitch, no bruits, no rebound, no guarding, no midline pulsatile mass, no hepatomegaly, no splenomegaly EXT:  2 plus pulses throughout, 2+ left ankle edema, no cyanosis no clubbing SKIN:  No rashes no nodules NEURO:  Cranial nerves II through XII grossly intact, motor grossly intact throughout PSYCH:  Cognitively intact, oriented to person place and time   EKG:  EKG is personally reviewed. 11/25/2021:  EKG was not ordered. 10/29/2020: NSR. Rate 62 bpm. Lateral T wave inversions. 04/08/2020: EKG was not ordered. 05/31/2022: Sinus rhythm. Rate 62 bpm. lateral T wave inversions    Event Monitor 10/2019: 30 Day Event Monitor   Quality: Fair.  Baseline artifact. Predominant rhythm: sinus rhythm Average heart rate: 61 bpm Max heart rate: 106 bpm Min heart rate: 58 bpm   No arrhythmias  Bilateral Carotid Dopplers 08/21/2019: Summary:  Right Carotid: Velocities in the right ICA are consistent with a 1-39%  stenosis.   Left Carotid: Velocities in the left ICA are consistent with a 1-39%  stenosis.   Vertebrals:  Bilateral vertebral arteries demonstrate antegrade flow.  Subclavians: Normal flow hemodynamics were seen in bilateral subclavian arteries.   Lexiscan Myoview 05/07/18: Nuclear stress EF: 68%. There was no ST segment deviation noted during stress. No T wave inversion was noted during stress. The study is normal. This is a low risk study. The left ventricular ejection fraction is hyperdynamic (>65%).   Echo 01/29/18: Study Conclusions - Left ventricle: The cavity size was normal. Wall thickness was   normal. Systolic function was normal. The estimated ejection   fraction was in the range of 60% to 65%.  Wall motion was normal;   there were no regional wall motion abnormalities. Features are   consistent  with a pseudonormal left ventricular filling pattern,   with concomitant abnormal relaxation and increased filling   pressure (grade 2 diastolic dysfunction). - Mitral valve: Calcified annulus. There was mild regurgitation. - Pericardium, extracardiac: A trivial pericardial effusion was   identified.   Impressions: - Normal LV systolic function; moderate diastolic dysfunction;   mild MR.  Recent Labs: 08/24/2021: Pro B Natriuretic peptide (BNP) 213.0 12/06/2021: BNP 110.8; BUN 9; Creatinine, Ser 0.91; Potassium 4.1; Sodium 143    Lipid Panel    Component Value Date/Time   CHOL 156 11/19/2019 1032   TRIG 56 11/19/2019 1032   HDL 81 11/19/2019 1032   CHOLHDL 1.9 11/19/2019 1032   LDLCALC 63 11/19/2019 1032     09/05/17: Total cholesterol 212, triglycerides 50, HDL 76, LDL 126   04/27/2021: Total cholesterol 163, triglycerides 67, HDL 78, LDL 172 Sodium 145, potassium 4.3, BUN 18, creatinine 0.97 AST 18, ALT 14  02/22/2021: Hemoglobin A1c 5.7%  Wt Readings from Last 3 Encounters:  05/31/22 211 lb 3.2 oz (95.8 kg)  04/12/22 206 lb (93.4 kg)  02/25/22 203 lb 3.2 oz (92.2 kg)     ASSESSMENT AND PLAN:  Essential hypertension Blood pressure has been well-controlled lately.  Continue losartan.  Exertional dyspnea Mrs. [name] has been feeling very tired with limited physical activity.  She gets short of breath just walking around her home.  She has no edema, orthopnea or PND.  We will get an echo and cardiac CT to assess for ischemia or evidence of heart failure.  If these are negative, will refer to PREP.  She also notes that there is some concern about lymphoma in her eye.  She is following up with ophthalmology and has a biopsy pending.  Check a CBC.  Chronic diastolic heart failure ([place]) [name] has been experiencing progressive exertional dyspnea.  She does not appear volume  overloaded on exam.  She does have diastolic dysfunction.  We will get an echocardiogram.  We will also get a coronary CTA as above.  Blood pressures well controlled as above.  Continue losartan and Lasix.  Check a CMP today.  Limit fluids to 64 ounces.  Pure hypercholesterolemia Continue aspirin and atorvastatin.  She has a history of prior stroke.  Check fasting lipids and CMP today.   Current medicines are reviewed at length with the patient today.  The patient does not have concerns regarding medicines.  The following changes have been made:  no change  Labs/ tests ordered today include:   Orders Placed This Encounter  Procedures   Comprehensive metabolic panel   Lipid panel   CBC with Differential/Platelet   MYOCARDIAL PERFUSION IMAGING   EKG 12-Lead   ECHOCARDIOGRAM COMPLETE     Disposition:    FU with Rydge Texidor C. Oval Linsey, MD, [facility] in 1-2 months.    I,Rachel Rivera,acting as a Education administrator for Skeet Latch, MD.,have documented all relevant documentation on the behalf of Skeet Latch, MD,as directed by  Skeet Latch, MD while in the presence of Skeet Latch, MD.  I, [place] Oval Linsey, MD have reviewed all documentation for this visit.  The documentation of the exam, diagnosis, procedures, and orders on 05/31/2022 are all accurate and complete.   Signed, Graclynn Vanantwerp C. Oval Linsey, MD, [facility]  05/31/2022 10:18 AM    [place]

## 2022-05-31 NOTE — Assessment & Plan Note (Signed)
Continue aspirin and atorvastatin.  She has a history of prior stroke.  Check fasting lipids and CMP today.

## 2022-06-01 ENCOUNTER — Telehealth (HOSPITAL_COMMUNITY): Payer: Self-pay | Admitting: *Deleted

## 2022-06-01 LAB — CBC WITH DIFFERENTIAL/PLATELET
Basophils Absolute: 0 10*3/uL (ref 0.0–0.2)
Basos: 1 %
EOS (ABSOLUTE): 0.2 10*3/uL (ref 0.0–0.4)
Eos: 3 %
Hematocrit: 38 % (ref 34.0–46.6)
Hemoglobin: 12.7 g/dL (ref 11.1–15.9)
Immature Grans (Abs): 0 10*3/uL (ref 0.0–0.1)
Immature Granulocytes: 0 %
Lymphocytes Absolute: 2 10*3/uL (ref 0.7–3.1)
Lymphs: 36 %
MCH: 30.5 pg (ref 26.6–33.0)
MCHC: 33.4 g/dL (ref 31.5–35.7)
MCV: 91 fL (ref 79–97)
Monocytes Absolute: 0.4 10*3/uL (ref 0.1–0.9)
Monocytes: 8 %
Neutrophils Absolute: 3.2 10*3/uL (ref 1.4–7.0)
Neutrophils: 52 %
Platelets: 120 10*3/uL — ABNORMAL LOW (ref 150–450)
RBC: 4.16 x10E6/uL (ref 3.77–5.28)
RDW: 13.1 % (ref 11.7–15.4)
WBC: 5.8 10*3/uL (ref 3.4–10.8)

## 2022-06-01 LAB — LIPID PANEL
Chol/HDL Ratio: 1.9 ratio (ref 0.0–4.4)
Cholesterol, Total: 167 mg/dL (ref 100–199)
HDL: 89 mg/dL (ref >39)
LDL Chol Calc (NIH): 65 mg/dL (ref 0–99)
Triglycerides: 66 mg/dL (ref 0–149)
VLDL Cholesterol Cal: 13 mg/dL (ref 5–40)

## 2022-06-01 LAB — COMPREHENSIVE METABOLIC PANEL
ALT: 8 IU/L (ref 0–32)
AST: 24 IU/L (ref 0–40)
Albumin/Globulin Ratio: 1.8 (ref 1.2–2.2)
Albumin: 4.4 g/dL (ref 3.8–4.8)
Alkaline Phosphatase: 138 IU/L — ABNORMAL HIGH (ref 44–121)
BUN/Creatinine Ratio: 14 (ref 12–28)
BUN: 13 mg/dL (ref 8–27)
Bilirubin Total: 0.6 mg/dL (ref 0.0–1.2)
CO2: 19 mmol/L — ABNORMAL LOW (ref 20–29)
Calcium: 9.2 mg/dL (ref 8.7–10.3)
Chloride: 104 mmol/L (ref 96–106)
Creatinine, Ser: 0.96 mg/dL (ref 0.57–1.00)
Globulin, Total: 2.5 g/dL (ref 1.5–4.5)
Glucose: 110 mg/dL — ABNORMAL HIGH (ref 70–99)
Potassium: 4.3 mmol/L (ref 3.5–5.2)
Sodium: 143 mmol/L (ref 134–144)
Total Protein: 6.9 g/dL (ref 6.0–8.5)
eGFR: 62 mL/min/{1.73_m2} (ref >59)

## 2022-06-01 NOTE — Telephone Encounter (Signed)
Left message on voicemail per DPR in reference to upcoming appointment scheduled on 06/06/2022 at 10:30 with detailed instructions given per Myocardial Perfusion Study Information Sheet for the test. LM to arrive 15 minutes early, and that it is imperative to arrive on time for appointment to keep from having the test rescheduled. If you need to cancel or reschedule your appointment, please call the office within 24 hours of your appointment. Failure to do so may result in a cancellation of your appointment, and a $50 no show fee. Phone number given for call back for any questions.

## 2022-06-02 ENCOUNTER — Ambulatory Visit (INDEPENDENT_AMBULATORY_CARE_PROVIDER_SITE_OTHER): Payer: Medicare Other

## 2022-06-02 DIAGNOSIS — R0609 Other forms of dyspnea: Secondary | ICD-10-CM

## 2022-06-02 DIAGNOSIS — I5032 Chronic diastolic (congestive) heart failure: Secondary | ICD-10-CM

## 2022-06-02 DIAGNOSIS — I10 Essential (primary) hypertension: Secondary | ICD-10-CM

## 2022-06-02 LAB — ECHOCARDIOGRAM COMPLETE
Area-P 1/2: 4.31 cm2
MV M vel: 3.55 m/s
MV Peak grad: 50.4 mmHg
S' Lateral: 2.36 cm

## 2022-06-03 DIAGNOSIS — M5416 Radiculopathy, lumbar region: Secondary | ICD-10-CM | POA: Diagnosis not present

## 2022-06-03 DIAGNOSIS — M4696 Unspecified inflammatory spondylopathy, lumbar region: Secondary | ICD-10-CM | POA: Diagnosis not present

## 2022-06-06 ENCOUNTER — Ambulatory Visit (HOSPITAL_COMMUNITY): Payer: Medicare Other | Attending: Cardiology

## 2022-06-06 DIAGNOSIS — R0609 Other forms of dyspnea: Secondary | ICD-10-CM | POA: Insufficient documentation

## 2022-06-06 DIAGNOSIS — E7800 Pure hypercholesterolemia, unspecified: Secondary | ICD-10-CM | POA: Insufficient documentation

## 2022-06-06 DIAGNOSIS — I10 Essential (primary) hypertension: Secondary | ICD-10-CM | POA: Insufficient documentation

## 2022-06-06 DIAGNOSIS — I5032 Chronic diastolic (congestive) heart failure: Secondary | ICD-10-CM | POA: Diagnosis not present

## 2022-06-06 LAB — MYOCARDIAL PERFUSION IMAGING
LV dias vol: 63 mL (ref 46–106)
LV sys vol: 23 mL
Nuc Stress EF: 64 %
Rest Nuclear Isotope Dose: 10.3 mCi
SDS: 3
SRS: 0
SSS: 3
ST Depression (mm): 0 mm
Stress Nuclear Isotope Dose: 32.7 mCi
TID: 0.86

## 2022-06-06 MED ORDER — TECHNETIUM TC 99M TETROFOSMIN IV KIT
10.3000 | PACK | Freq: Once | INTRAVENOUS | Status: AC | PRN
  Administered 2022-06-06: 10.3 via INTRAVENOUS

## 2022-06-06 MED ORDER — TECHNETIUM TC 99M TETROFOSMIN IV KIT
32.7000 | PACK | Freq: Once | INTRAVENOUS | Status: AC | PRN
  Administered 2022-06-06: 32.7 via INTRAVENOUS

## 2022-06-06 MED ORDER — REGADENOSON 0.4 MG/5ML IV SOLN
0.4000 mg | Freq: Once | INTRAVENOUS | Status: AC
  Administered 2022-06-06: 0.4 mg via INTRAVENOUS

## 2022-06-14 DIAGNOSIS — H16121 Filamentary keratitis, right eye: Secondary | ICD-10-CM | POA: Diagnosis not present

## 2022-06-14 DIAGNOSIS — M329 Systemic lupus erythematosus, unspecified: Secondary | ICD-10-CM | POA: Diagnosis not present

## 2022-06-14 DIAGNOSIS — Z79899 Other long term (current) drug therapy: Secondary | ICD-10-CM | POA: Diagnosis not present

## 2022-06-14 DIAGNOSIS — H40003 Preglaucoma, unspecified, bilateral: Secondary | ICD-10-CM | POA: Diagnosis not present

## 2022-06-14 DIAGNOSIS — H119 Unspecified disorder of conjunctiva: Secondary | ICD-10-CM | POA: Diagnosis not present

## 2022-06-20 DIAGNOSIS — Z881 Allergy status to other antibiotic agents status: Secondary | ICD-10-CM | POA: Diagnosis not present

## 2022-06-20 DIAGNOSIS — Z79899 Other long term (current) drug therapy: Secondary | ICD-10-CM | POA: Diagnosis not present

## 2022-06-20 DIAGNOSIS — Z91041 Radiographic dye allergy status: Secondary | ICD-10-CM | POA: Diagnosis not present

## 2022-06-20 DIAGNOSIS — H16121 Filamentary keratitis, right eye: Secondary | ICD-10-CM | POA: Diagnosis not present

## 2022-06-20 DIAGNOSIS — H57813 Brow ptosis, bilateral: Secondary | ICD-10-CM | POA: Diagnosis not present

## 2022-06-20 DIAGNOSIS — H11011 Amyloid pterygium of right eye: Secondary | ICD-10-CM | POA: Diagnosis not present

## 2022-06-20 DIAGNOSIS — Z885 Allergy status to narcotic agent status: Secondary | ICD-10-CM | POA: Diagnosis not present

## 2022-06-20 DIAGNOSIS — H16141 Punctate keratitis, right eye: Secondary | ICD-10-CM | POA: Diagnosis not present

## 2022-06-20 DIAGNOSIS — H119 Unspecified disorder of conjunctiva: Secondary | ICD-10-CM | POA: Diagnosis not present

## 2022-06-20 DIAGNOSIS — H2513 Age-related nuclear cataract, bilateral: Secondary | ICD-10-CM | POA: Diagnosis not present

## 2022-06-20 DIAGNOSIS — Z48810 Encounter for surgical aftercare following surgery on the sense organs: Secondary | ICD-10-CM | POA: Diagnosis not present

## 2022-06-20 DIAGNOSIS — H40003 Preglaucoma, unspecified, bilateral: Secondary | ICD-10-CM | POA: Diagnosis not present

## 2022-06-20 DIAGNOSIS — Z8669 Personal history of other diseases of the nervous system and sense organs: Secondary | ICD-10-CM | POA: Diagnosis not present

## 2022-06-28 ENCOUNTER — Inpatient Hospital Stay (HOSPITAL_BASED_OUTPATIENT_CLINIC_OR_DEPARTMENT_OTHER): Payer: Medicare Other | Admitting: Hematology & Oncology

## 2022-06-28 ENCOUNTER — Encounter: Payer: Self-pay | Admitting: Hematology & Oncology

## 2022-06-28 ENCOUNTER — Inpatient Hospital Stay: Payer: Medicare Other | Attending: Hematology & Oncology

## 2022-06-28 VITALS — BP 138/50 | HR 63 | Temp 98.7°F | Resp 20 | Ht 66.0 in | Wt 209.1 lb

## 2022-06-28 DIAGNOSIS — E854 Organ-limited amyloidosis: Secondary | ICD-10-CM | POA: Diagnosis not present

## 2022-06-28 DIAGNOSIS — Z8572 Personal history of non-Hodgkin lymphomas: Secondary | ICD-10-CM | POA: Diagnosis not present

## 2022-06-28 DIAGNOSIS — C8300 Small cell B-cell lymphoma, unspecified site: Secondary | ICD-10-CM

## 2022-06-28 DIAGNOSIS — E8589 Other amyloidosis: Secondary | ICD-10-CM | POA: Diagnosis not present

## 2022-06-28 DIAGNOSIS — Z96643 Presence of artificial hip joint, bilateral: Secondary | ICD-10-CM | POA: Insufficient documentation

## 2022-06-28 DIAGNOSIS — Z87891 Personal history of nicotine dependence: Secondary | ICD-10-CM | POA: Diagnosis not present

## 2022-06-28 LAB — CBC WITH DIFFERENTIAL (CANCER CENTER ONLY)
Abs Immature Granulocytes: 0.01 10*3/uL (ref 0.00–0.07)
Basophils Absolute: 0 10*3/uL (ref 0.0–0.1)
Basophils Relative: 1 %
Eosinophils Absolute: 0.1 10*3/uL (ref 0.0–0.5)
Eosinophils Relative: 2 %
HCT: 35.4 % — ABNORMAL LOW (ref 36.0–46.0)
Hemoglobin: 11.5 g/dL — ABNORMAL LOW (ref 12.0–15.0)
Immature Granulocytes: 0 %
Lymphocytes Relative: 37 %
Lymphs Abs: 1.8 10*3/uL (ref 0.7–4.0)
MCH: 30 pg (ref 26.0–34.0)
MCHC: 32.5 g/dL (ref 30.0–36.0)
MCV: 92.4 fL (ref 80.0–100.0)
Monocytes Absolute: 0.3 10*3/uL (ref 0.1–1.0)
Monocytes Relative: 6 %
Neutro Abs: 2.6 10*3/uL (ref 1.7–7.7)
Neutrophils Relative %: 54 %
Platelet Count: 148 10*3/uL — ABNORMAL LOW (ref 150–400)
RBC: 3.83 MIL/uL — ABNORMAL LOW (ref 3.87–5.11)
RDW: 13.1 % (ref 11.5–15.5)
WBC Count: 4.9 10*3/uL (ref 4.0–10.5)
nRBC: 0 % (ref 0.0–0.2)

## 2022-06-28 LAB — CMP (CANCER CENTER ONLY)
ALT: 9 U/L (ref 0–44)
AST: 15 U/L (ref 15–41)
Albumin: 4.3 g/dL (ref 3.5–5.0)
Alkaline Phosphatase: 108 U/L (ref 38–126)
Anion gap: 10 (ref 5–15)
BUN: 13 mg/dL (ref 8–23)
CO2: 27 mmol/L (ref 22–32)
Calcium: 8.8 mg/dL — ABNORMAL LOW (ref 8.9–10.3)
Chloride: 105 mmol/L (ref 98–111)
Creatinine: 0.91 mg/dL (ref 0.44–1.00)
GFR, Estimated: >60 mL/min (ref >60)
Glucose, Bld: 118 mg/dL — ABNORMAL HIGH (ref 70–99)
Potassium: 3.7 mmol/L (ref 3.5–5.1)
Sodium: 142 mmol/L (ref 135–145)
Total Bilirubin: 0.7 mg/dL (ref 0.3–1.2)
Total Protein: 6.6 g/dL (ref 6.5–8.1)

## 2022-06-28 LAB — LACTATE DEHYDROGENASE: LDH: 135 U/L (ref 98–192)

## 2022-06-28 LAB — SAVE SMEAR(SSMR), FOR PROVIDER SLIDE REVIEW

## 2022-06-28 NOTE — Progress Notes (Signed)
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  : I am back to see you again.  HPI: [name] is known to me.  She is a very charming 71 year old African-American female.  I actually have seen her in the past.  She presented with lymphoplasmacytic lymphoma.  She was treated with all FCR.  She had treatment back probably 16 years ago.  She completed all of her treatments in March 2010.  She was last seen here in 2015.  She has had a lot of health problems since we last saw her.  She has had multiple surgeries.  She has had problems with her right eye.  She has had issues with I think dryness of her right eye.  She has been seen by ophthalmology.  Ultimately, she was referred to [facility].  She saw Dr.Spyris.  Dr. Cristela Blue did a conjunctival biopsy.  This was done on 06/14/2022.  The pathology report [id number]) showed amyloid deposition.  She has not been feeling all that well.  She feels tired all the time.  She has an echocardiogram that was done back in November.  This showed ejection fraction of 65-70%.  There is none that would suggest diastolic dysfunction.  Her valves all looked fine.  She was referred back to the was not [facility] for an evaluation.  She has had no problems with bowels or bladder.  She thinks her last colonoscopy was less than 10 years ago.  She has had her mammograms routinely.  She has had no weight loss or weight gain.  She is not all that hungry.  She has had no cough or shortness of breath.  She has never had problems with COVID.  There has been no rashes.  She has had no leg swelling.  She has had no fever.  There is been no swollen lymph nodes.  She has had no headache.  She has had some blurred vision, mostly with the right eye.  She has had no problems swallowing.  To his been no focal issues.  She has not noted  any tongue swelling..  She has had left shoulder replacement.  She has had bilateral hip replacement.  Currently, she does not smoke or drink.  As far she knows, there is no history in the family of any type of hematologic problem.  Currently, I would say performance status is probably ECOG 1.   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[place]) 07/13/2011   Near syncope 08/30/2019   PONV (postoperative nausea and vomiting)    Pure hypercholesterolemia 05/31/2022   Stroke ([place])    lt side weakness  :   Past Surgical History:  Procedure Laterality Date   ABDOMINAL HYSTERECTOMY     BACK SURGERY     CHOLECYSTECTOMY     COLONOSCOPY     FOOT SURGERY     x2   MASS EXCISION     back   rortator     rotator cuff   TOTAL SHOULDER ARTHROPLASTY Left 08/16/2018   Procedure: LEFT SHOULDER REVERSE TOTAL SHOULDER ARTHROPLASTY AND BICEPS TENOTOMY;  Surgeon: Tania Ade,  MD;  Location: [place];  Service: Orthopedics;  Laterality: Left;  :   Current Outpatient Medications:    acetaminophen (TYLENOL) 500 MG tablet, Take 1,000 mg by mouth every 6 (six) hours as needed for mild pain or headache., Disp: , Rfl:    aspirin 81 MG chewable tablet, Chew by mouth daily., Disp: , Rfl:    Biotin 1000 MCG tablet, Take 1,000 mcg by mouth daily., Disp: , Rfl:    cholecalciferol (VITAMIN D) 1000 UNITS tablet, Take 1,000 Units by mouth daily. , Disp: , Rfl:    fluorometholone (FML) 0.1 % ophthalmic suspension, Place 1 drop into the right eye 2 (two) times daily., Disp: , Rfl:    fluticasone-salmeterol (ADVAIR HFA) 230-21 MCG/ACT inhaler, Inhale 2 puffs into the lungs 2 (two) times daily., Disp: 1 each, Rfl: 5   furosemide (LASIX) 80 MG tablet, TAKE ONE AND ONE-HALF TABLETS BY  MOUTH DAILY. NEW DOSE, Disp: 135 tablet, Rfl: 1   hydroxychloroquine (PLAQUENIL) 200 MG tablet, Take 200 mg by mouth daily., Disp: , Rfl:    latanoprost (XALATAN) 0.005 % ophthalmic solution, Place 1 drop into both eyes at bedtime., Disp: , Rfl:    losartan (COZAAR) 50 MG tablet, Take 1 tablet (50 mg total) by mouth daily., Disp: 90 tablet, Rfl: 3   Omega-3 Fatty Acids (OMEGA 3 500 PO), Take 1 capsule by mouth daily., Disp: , Rfl:    pantoprazole (PROTONIX) 40 MG tablet, Take 40 mg by mouth daily., Disp: , Rfl:    potassium chloride SA (K-DUR,KLOR-CON) 20 MEQ tablet, Take 2 tablets (40 mEq total) by mouth 2 (two) times daily. (Patient taking differently: Take 20 mEq by mouth 2 (two) times daily. Take 2 in the AM and 1 in the PM), Disp: , Rfl:    rosuvastatin (CRESTOR) 20 MG tablet, TAKE 1 TABLET BY MOUTH DAILY, Disp: 90 tablet, Rfl: 2   Saccharomyces boulardii (PROBIOTIC) 250 MG CAPS, Take 1 capsule by mouth daily., Disp: , Rfl:    Turmeric 500 MG CAPS, Take by mouth daily., Disp: , Rfl:    albuterol (VENTOLIN HFA) 108 (90 Base) MCG/ACT inhaler, Inhale 2 puffs into the lungs every 6 (six) hours as needed for wheezing or shortness of breath. (Patient not taking: Reported on 06/28/2022), Disp: 8 g, Rfl: 6   Spacer/Aero-Holding Chambers (AEROCHAMBER MV) inhaler, Use as instructed (Patient not taking: Reported on 06/28/2022), Disp: 1 each, Rfl: 0   traMADol (ULTRAM) 50 MG tablet, Take 50 mg by mouth every 8 (eight) hours as needed. (Patient not taking: Reported on 06/28/2022), Disp: , Rfl:   Current Facility-Administered Medications:    lidocaine-EPINEPHrine (XYLOCAINE W/EPI) 1 %-1:100000 (with pres) injection 10 mL, 10 mL, Infiltration, Once, Croitoru, Mihai, MD   lidocaine-EPINEPHrine (XYLOCAINE W/EPI) 1 %-1:100000 (with pres) injection 15 mL, 15 mL, Infiltration, Once, Croitoru, Mihai, MD:   lidocaine-EPINEPHrine  10 mL Infiltration Once   lidocaine-EPINEPHrine  15 mL Infiltration Once   :   Allergies  Allergen Reactions   Iodinated Contrast Media Other (See Comments)    Kidney failure.    Ciprofloxacin Itching   E-Mycin [Erythromycin Base] Nausea And Vomiting   Oxycodone Other (See Comments)    Hallucination.  :   Family History  Problem Relation Age of Onset   Lung disease Father    Coronary artery disease Brother    Breast cancer Neg Hx   :   Social History   Socioeconomic History   Marital status: Married    Spouse name:  Not on file   Number of children: Not on file   Years of education: Not on file   Highest education level: Not on file  Occupational History   Not on file  Tobacco Use   Smoking status: Former    Packs/day: 1.00    Years: 10.00    Total pack years: 10.00    Types: Cigarettes    Quit date: 06/17/1994    Years since quitting: 28.0    Passive exposure: Past   Smokeless tobacco: Never  Vaping Use   Vaping Use: Never used  Substance and Sexual Activity   Alcohol use: Not Currently    Alcohol/week: 0.0 standard drinks of alcohol   Drug use: No   Sexual activity: Not Currently  Other Topics Concern   Not on file  Social History Narrative   Not on file   Social Determinants of Health   Financial Resource Strain: Not on file  Food Insecurity: Not on file  Transportation Needs: Not on file  Physical Activity: Not on file  Stress: Not on file  Social Connections: Not on file  Intimate Partner Violence: Not on file  :  Review of Systems  Constitutional:  Positive for malaise/fatigue.  HENT: Negative.    Eyes:  Positive for blurred vision.  Respiratory: Negative.    Cardiovascular: Negative.   Gastrointestinal: Negative.   Genitourinary: Negative.  Negative for dysuria.  Musculoskeletal:  Positive for joint pain.  Skin: Negative.   Neurological: Negative.   Endo/Heme/Allergies: Negative.   Psychiatric/Behavioral: Negative.       Exam: Her vital signs show temperature of 98.7.  Pulse 63.  Blood pressure 138/50.   Weight is 209 pounds.  _0 @ Physical Exam Vitals reviewed.  HENT:     Head: Normocephalic and atraumatic.  Eyes:     Pupils: Pupils are equal, round, and reactive to light.     Comments: She has a little bit of ptosis of the right eye.  There is maybe a little bit of conjunctival inflammation.  Her pupil reacts appropriately.  Her extraocular muscles are intact.  Cardiovascular:     Rate and Rhythm: Normal rate and regular rhythm.     Heart sounds: Normal heart sounds.  Pulmonary:     Effort: Pulmonary effort is normal.     Breath sounds: Normal breath sounds.  Abdominal:     General: Bowel sounds are normal.     Palpations: Abdomen is soft.  Musculoskeletal:        General: No tenderness or deformity. Normal range of motion.     Cervical back: Normal range of motion.  Lymphadenopathy:     Cervical: No cervical adenopathy.  Skin:    General: Skin is warm and dry.     Findings: No erythema or rash.  Neurological:     Mental Status: She is alert and oriented to person, place, and time.  Psychiatric:        Behavior: Behavior normal.        Thought Content: Thought content normal.        Judgment: Judgment normal.     Recent Labs    06/28/22 1034  WBC 4.9  HGB 11.5*  HCT 35.4*  PLT 148*    Recent Labs    06/28/22 1034  NA 142  K 3.7  CL 105  CO2 27  GLUCOSE 118*  BUN 13  CREATININE 0.91  CALCIUM 8.8*    Blood smear review: None  Pathology: See above  Assessment and Plan: [name] is a very nice 70 year old Afro-American female.  I have seen her in the past for lymphoplasmacytic lymphoma.  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  I know this is unusual.  I cannot find anything, so far, on her exam that would suggest a systemic disease.  Unfortunately, I think were going to have to do a bone marrow biopsy on her to see if there is any evidence of amyloidosis.  I will have her do a 24-hour  urine.  I will like to get a ultrasound of her kidneys to see if there is any change in her kidney size that might suggest amyloid deposition.  Again, her physical exam is negative for any obvious amyloid deposition.  If we do not find any evidence of systemic amyloid, we can then refer her back to Ophthalmology as they are the typically the physicians to deal with primary conjunctival amyloidosis.  It was nice seeing [name] again.  She has been through quite a lot since we last saw her.  I have to believe that there is no problem with respect to recurrence of her lymphoma or of any systemic amyloid.  Once we have all of our results back, then we will plan to get her back for follow-up.

## 2022-06-29 DIAGNOSIS — E8589 Other amyloidosis: Secondary | ICD-10-CM | POA: Diagnosis not present

## 2022-06-29 LAB — IGG, IGA, IGM
IgA: 51 mg/dL — ABNORMAL LOW (ref 64–422)
IgG (Immunoglobin G), Serum: 908 mg/dL (ref 586–1602)
IgM (Immunoglobulin M), Srm: <5 mg/dL — ABNORMAL LOW (ref 26–217)

## 2022-06-29 LAB — BETA 2 MICROGLOBULIN, SERUM: Beta-2 Microglobulin: 2.7 mg/L — ABNORMAL HIGH (ref 0.6–2.4)

## 2022-06-29 LAB — KAPPA/LAMBDA LIGHT CHAINS
Kappa free light chain: 16.1 mg/L (ref 3.3–19.4)
Kappa, lambda light chain ratio: 0.83 (ref 0.26–1.65)
Lambda free light chains: 19.3 mg/L (ref 5.7–26.3)

## 2022-06-30 ENCOUNTER — Telehealth: Payer: Self-pay

## 2022-06-30 ENCOUNTER — Inpatient Hospital Stay: Payer: Medicare Other

## 2022-06-30 DIAGNOSIS — C8300 Small cell B-cell lymphoma, unspecified site: Secondary | ICD-10-CM

## 2022-06-30 NOTE — Telephone Encounter (Signed)
CSW attempted to contact patient per new patient protocol.  Left a vm.

## 2022-07-05 ENCOUNTER — Telehealth: Payer: Self-pay | Admitting: *Deleted

## 2022-07-05 ENCOUNTER — Ambulatory Visit (HOSPITAL_COMMUNITY)
Admission: RE | Admit: 2022-07-05 | Discharge: 2022-07-05 | Disposition: A | Discharge status: Home / Self Care | Payer: Medicare Other | Source: Ambulatory Visit | Attending: Hematology & Oncology | Admitting: Hematology & Oncology

## 2022-07-05 DIAGNOSIS — E854 Organ-limited amyloidosis: Secondary | ICD-10-CM | POA: Insufficient documentation

## 2022-07-05 DIAGNOSIS — N2889 Other specified disorders of kidney and ureter: Secondary | ICD-10-CM | POA: Diagnosis not present

## 2022-07-05 LAB — UPEP/UIFE/LIGHT CHAINS/TP, 24-HR UR
% BETA, Urine: 0 %
ALPHA 1 URINE: 0 %
Albumin, U: 100 %
Alpha 2, Urine: 0 %
Free Kappa Lt Chains,Ur: 7.17 mg/L (ref 1.17–86.46)
Free Kappa/Lambda Ratio: 9.08 (ref 1.83–14.26)
Free Lambda Lt Chains,Ur: 0.79 mg/L (ref 0.27–15.21)
GAMMA GLOBULIN URINE: 0 %
Total Protein, Urine-Ur/day: 95 mg/24 hr (ref 30–150)
Total Protein, Urine: 7 mg/dL
Total Volume: 1350

## 2022-07-05 NOTE — Telephone Encounter (Signed)
-----   Message from Volanda Napoleon, MD sent at 07/05/2022 11:51 AM EST ----- Please call and let her know that the ultrasound of the kidneys looks normal.  They do not look like they are involved with amyloid.  Thanks.  Laurey Arrow

## 2022-07-05 NOTE — Telephone Encounter (Signed)
As noted below by Dr. Marin Olp, I informed the patient that the ultrasound of the kidneys looks normal and they are not involved with amyloid. She verbalized understanding.

## 2022-07-06 LAB — PROTEIN ELECTROPHORESIS, SERUM, WITH REFLEX
A/G Ratio: 1.2 (ref 0.7–1.7)
Albumin ELP: 3.6 g/dL (ref 2.9–4.4)
Alpha-1-Globulin: 0.3 g/dL (ref 0.0–0.4)
Alpha-2-Globulin: 0.9 g/dL (ref 0.4–1.0)
Beta Globulin: 0.9 g/dL (ref 0.7–1.3)
Gamma Globulin: 0.8 g/dL (ref 0.4–1.8)
Globulin, Total: 2.9 g/dL (ref 2.2–3.9)
M-Spike, %: 0.3 g/dL — ABNORMAL HIGH
SPEP Interpretation: 0
Total Protein ELP: 6.5 g/dL (ref 6.0–8.5)

## 2022-07-06 LAB — IMMUNOFIXATION REFLEX, SERUM
IgA: 51 mg/dL — ABNORMAL LOW (ref 64–422)
IgG (Immunoglobin G), Serum: 901 mg/dL (ref 586–1602)
IgM (Immunoglobulin M), Srm: <5 mg/dL — ABNORMAL LOW (ref 26–217)

## 2022-07-07 DIAGNOSIS — H5589 Other irregular eye movements: Secondary | ICD-10-CM | POA: Diagnosis not present

## 2022-07-07 DIAGNOSIS — H119 Unspecified disorder of conjunctiva: Secondary | ICD-10-CM | POA: Diagnosis not present

## 2022-07-08 DIAGNOSIS — M47816 Spondylosis without myelopathy or radiculopathy, lumbar region: Secondary | ICD-10-CM | POA: Diagnosis not present

## 2022-07-11 ENCOUNTER — Other Ambulatory Visit: Payer: Self-pay | Admitting: Internal Medicine

## 2022-07-11 DIAGNOSIS — E854 Organ-limited amyloidosis: Secondary | ICD-10-CM | POA: Diagnosis not present

## 2022-07-11 DIAGNOSIS — H16121 Filamentary keratitis, right eye: Secondary | ICD-10-CM | POA: Diagnosis not present

## 2022-07-11 DIAGNOSIS — H119 Unspecified disorder of conjunctiva: Secondary | ICD-10-CM | POA: Insufficient documentation

## 2022-07-11 DIAGNOSIS — H40003 Preglaucoma, unspecified, bilateral: Secondary | ICD-10-CM | POA: Diagnosis not present

## 2022-07-11 DIAGNOSIS — C8300 Small cell B-cell lymphoma, unspecified site: Secondary | ICD-10-CM

## 2022-07-12 ENCOUNTER — Other Ambulatory Visit: Payer: Self-pay

## 2022-07-12 ENCOUNTER — Ambulatory Visit (HOSPITAL_COMMUNITY)
Admission: RE | Admit: 2022-07-12 | Discharge: 2022-07-12 | Disposition: A | Discharge status: Home / Self Care | Payer: Medicare Other | Source: Ambulatory Visit | Attending: Hematology & Oncology | Admitting: Hematology & Oncology

## 2022-07-12 ENCOUNTER — Encounter (HOSPITAL_COMMUNITY): Payer: Self-pay

## 2022-07-12 DIAGNOSIS — D696 Thrombocytopenia, unspecified: Secondary | ICD-10-CM | POA: Insufficient documentation

## 2022-07-12 DIAGNOSIS — Z79899 Other long term (current) drug therapy: Secondary | ICD-10-CM | POA: Insufficient documentation

## 2022-07-12 DIAGNOSIS — Z9049 Acquired absence of other specified parts of digestive tract: Secondary | ICD-10-CM | POA: Diagnosis not present

## 2022-07-12 DIAGNOSIS — N3289 Other specified disorders of bladder: Secondary | ICD-10-CM | POA: Insufficient documentation

## 2022-07-12 DIAGNOSIS — R339 Retention of urine, unspecified: Secondary | ICD-10-CM | POA: Diagnosis not present

## 2022-07-12 DIAGNOSIS — C8300 Small cell B-cell lymphoma, unspecified site: Secondary | ICD-10-CM | POA: Insufficient documentation

## 2022-07-12 DIAGNOSIS — E854 Organ-limited amyloidosis: Secondary | ICD-10-CM

## 2022-07-12 DIAGNOSIS — D472 Monoclonal gammopathy: Secondary | ICD-10-CM | POA: Insufficient documentation

## 2022-07-12 DIAGNOSIS — Z7951 Long term (current) use of inhaled steroids: Secondary | ICD-10-CM | POA: Insufficient documentation

## 2022-07-12 DIAGNOSIS — Z0389 Encounter for observation for other suspected diseases and conditions ruled out: Secondary | ICD-10-CM | POA: Diagnosis not present

## 2022-07-12 LAB — CBC WITH DIFFERENTIAL/PLATELET
Abs Immature Granulocytes: 0.01 10*3/uL (ref 0.00–0.07)
Basophils Absolute: 0 10*3/uL (ref 0.0–0.1)
Basophils Relative: 0 %
Eosinophils Absolute: 0.1 10*3/uL (ref 0.0–0.5)
Eosinophils Relative: 1 %
HCT: 38 % (ref 36.0–46.0)
Hemoglobin: 12 g/dL (ref 12.0–15.0)
Immature Granulocytes: 0 %
Lymphocytes Relative: 41 %
Lymphs Abs: 2.3 10*3/uL (ref 0.7–4.0)
MCH: 29.6 pg (ref 26.0–34.0)
MCHC: 31.6 g/dL (ref 30.0–36.0)
MCV: 93.6 fL (ref 80.0–100.0)
Monocytes Absolute: 0.5 10*3/uL (ref 0.1–1.0)
Monocytes Relative: 9 %
Neutro Abs: 2.7 10*3/uL (ref 1.7–7.7)
Neutrophils Relative %: 49 %
Platelets: 88 10*3/uL — ABNORMAL LOW (ref 150–400)
RBC: 4.06 MIL/uL (ref 3.87–5.11)
RDW: 13.2 % (ref 11.5–15.5)
WBC: 5.7 10*3/uL (ref 4.0–10.5)
nRBC: 0 % (ref 0.0–0.2)

## 2022-07-12 MED ORDER — MIDAZOLAM HCL 2 MG/2ML IJ SOLN
INTRAMUSCULAR | Status: AC | PRN
  Administered 2022-07-12 (×2): 1 mg via INTRAVENOUS

## 2022-07-12 MED ORDER — NALOXONE HCL 0.4 MG/ML IJ SOLN
INTRAMUSCULAR | Status: AC
  Filled 2022-07-12: qty 1

## 2022-07-12 MED ORDER — SODIUM CHLORIDE 0.9 % IV SOLN: INTRAVENOUS | Status: DC

## 2022-07-12 MED ORDER — LIDOCAINE HCL (PF) 1 % IJ SOLN
INTRAMUSCULAR | Status: AC | PRN
  Administered 2022-07-12: 10 mL

## 2022-07-12 MED ORDER — ONDANSETRON HCL 4 MG/2ML IJ SOLN
INTRAMUSCULAR | Status: AC | PRN
  Administered 2022-07-12: 4 mg via INTRAVENOUS

## 2022-07-12 MED ORDER — FENTANYL CITRATE (PF) 100 MCG/2ML IJ SOLN
INTRAMUSCULAR | Status: AC
  Filled 2022-07-12: qty 4

## 2022-07-12 MED ORDER — FENTANYL CITRATE (PF) 100 MCG/2ML IJ SOLN
INTRAMUSCULAR | Status: AC | PRN
  Administered 2022-07-12 (×3): 50 ug via INTRAVENOUS

## 2022-07-12 MED ORDER — ONDANSETRON HCL 4 MG/2ML IJ SOLN
INTRAMUSCULAR | Status: AC
  Filled 2022-07-12: qty 2

## 2022-07-12 MED ORDER — MIDAZOLAM HCL 2 MG/2ML IJ SOLN
INTRAMUSCULAR | Status: AC
  Filled 2022-07-12: qty 4

## 2022-07-12 MED ORDER — FLUMAZENIL 0.5 MG/5ML IV SOLN
INTRAVENOUS | Status: AC
  Filled 2022-07-12: qty 5

## 2022-07-12 NOTE — Discharge Instructions (Addendum)
Please call Interventional Radiology clinic [phone] with any questions or concerns.  You may remove your dressing and shower tomorrow.   Moderate Conscious Sedation, Adult, Care After This sheet gives you information about how to care for yourself after your procedure. Your health care provider may also give you more specific instructions. If you have problems or questions, contact your health careprovider. What can I expect after the procedure? After the procedure, it is common to have: Sleepiness for several hours. Impaired judgment for several hours. Difficulty with balance. Vomiting if you eat too soon. Follow these instructions at home: For the time period you were told by your health care provider: Rest. Do not participate in activities where you could fall or become injured. Do not drive or use machinery. Do not drink alcohol. Do not take sleeping pills or medicines that cause drowsiness. Do not make important decisions or sign legal documents. Do not take care of children on your own. Eating and drinking  Follow the diet recommended by your health care provider. Drink enough fluid to keep your urine pale yellow. If you vomit: Drink water, juice, or soup when you can drink without vomiting. Make sure you have little or no nausea before eating solid foods.  General instructions Take over-the-counter and prescription medicines only as told by your health care provider. Have a responsible adult stay with you for the time you are told. It is important to have someone help care for you until you are awake and alert. Do not smoke. Keep all follow-up visits as told by your health care provider. This is important. Contact a health care provider if: You are still sleepy or having trouble with balance after 24 hours. You feel light-headed. You keep feeling nauseous or you keep vomiting. You develop a rash. You have a fever. You have redness or swelling around the IV  site. Get help right away if: You have trouble breathing. You have new-onset confusion at home. Summary After the procedure, it is common to feel sleepy, have impaired judgment, or feel nauseous if you eat too soon. Rest after you get home. Know the things you should not do after the procedure. Follow the diet recommended by your health care provider and drink enough fluid to keep your urine pale yellow. Get help right away if you have trouble breathing or new-onset confusion at home. This information is not intended to replace advice given to you by your health care provider. Make sure you discuss any questions you have with your healthcare provider. Document Revised: 10/18/2019 Document Reviewed: 05/16/2019 Elsevier Patient Education  2022 Else[employer] Inc[employer]   Bone Marrow Aspiration and Bone Marrow Biopsy, Adult, Care After This sheet gives you information about how to care for yourself after your procedure. Your health care provider may also give you more specific instructions. If you have problems or questions, contact your health careprovider. What can I expect after the procedure? After the procedure, it is common to have: Mild pain and tenderness. Swelling. Bruising. Follow these instructions at home: Puncture site care Follow instructions from your health care provider about how to take care of the puncture site. Make sure you: Wash your hands with soap and water before and after you change your bandage (dressing). If soap and water are not available, use hand sanitizer. Change your dressing as told by your health care provider. Check your puncture site every day for signs of infection. Check for: More redness, swelling, or pain. Fluid or blood. Warmth. Pus or a   bad smell.  Activity Return to your normal activities as told by your health care provider. Ask your health care provider what activities are safe for you. Do not lift anything that is heavier than 10 lb (4.5 kg), or the  limit that you are told, until your health care provider says that it is safe. Do not drive for 24 hours if you were given a sedative during your procedure. General instructions Take over-the-counter and prescription medicines only as told by your health care provider. Do not take baths, swim, or use a hot tub until your health care provider approves. Ask your health care provider if you may take showers. You may only be allowed to take sponge baths. If directed, put ice on the affected area. To do this: Put ice in a plastic bag. Place a towel between your skin and the bag. Leave the ice on for 20 minutes, 2-3 times a day. Keep all follow-up visits as told by your health care provider. This is important.  Contact a health care provider if: Your pain is not controlled with medicine. You have a fever. You have more redness, swelling, or pain around the puncture site. You have fluid or blood coming from the puncture site. Your puncture site feels warm to the touch. You have pus or a bad smell coming from the puncture site. Summary After the procedure, it is common to have mild pain, tenderness, swelling, and bruising. Follow instructions from your health care provider about how to take care of the puncture site and what activities are safe for you. Take over-the-counter and prescription medicines only as told by your health care provider. Contact a health care provider if you have any signs of infection, such as fluid or blood coming from the puncture site. This information is not intended to replace advice given to you by your health care provider. Make sure you discuss any questions you have with your healthcare provider. Document Revised: 11/06/2018 Document Reviewed: 11/06/2018 Elsevier Patient Education  2022 [employer].  

## 2022-07-12 NOTE — H&P (Signed)
Chief Complaint: Patient was seen in consultation today for bone marrow biopsy at the request of Ennever,Peter R  Referring Physician(s): Ennever,Peter R  Supervising Physician: Corrie Mckusick  Patient Status: [facility] - Out-pt  History of Present Illness: [name] is a 72 y.o. female with prior hx of lymphoma. 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 She was seen by Dr. Marin Olp and has been referred for bone marrow biopsy. PMHx, meds, labs, imaging, allergies reviewed. Feels well, no recent fevers, chills, illness. Has been NPO today as directed. Family at bedside.   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 2022 - right   MASS EXCISION     back   rortator     rotator cuff   TOTAL SHOULDER ARTHROPLASTY Left 08/16/2018   Procedure: LEFT SHOULDER REVERSE TOTAL SHOULDER ARTHROPLASTY AND BICEPS TENOTOMY;  Surgeon: Tania Ade, MD;  Location: [place];  Service: Orthopedics;  Laterality: Left;    Allergies: Iodinated contrast media, Ciprofloxacin, E-mycin [erythromycin base], and Oxycodone  Medications: Prior to Admission medications   Medication Sig Start Date End Date Taking? Authorizing Provider  acetaminophen (TYLENOL) 500 MG tablet Take 1,000 mg by mouth every 6 (six) hours as needed for mild pain or headache.   Yes  [provider]  albuterol (VENTOLIN HFA) 108 (90 Base) MCG/ACT inhaler Inhale 2 puffs into the lungs every 6 (six) hours as needed for wheezing or shortness of breath. 10/27/21  Yes Rigoberto Noel, MD  aspirin 81 MG chewable tablet Chew by mouth daily.   Yes [provider]  Biotin 1000 MCG tablet Take 1,000 mcg by mouth daily.   Yes [provider]  cholecalciferol (VITAMIN D) 1000 UNITS tablet Take 1,000 Units by mouth daily.    Yes [provider]  fluorometholone (FML) 0.1 % ophthalmic suspension Place 1 drop into the right eye 2 (two) times daily. 05/30/22  Yes [provider]  fluticasone-salmeterol (ADVAIR HFA) 230-21 MCG/ACT inhaler Inhale 2 puffs into the lungs 2 (two) times daily. 02/10/22  Yes Cobb, Karie Schwalbe, NP  furosemide (LASIX) 80 MG tablet TAKE ONE AND ONE-HALF TABLETS BY MOUTH DAILY. NEW DOSE 03/30/22  Yes Skeet Latch, MD  hydroxychloroquine (PLAQUENIL) 200 MG tablet Take 200 mg by mouth daily.   Yes [provider]  latanoprost (XALATAN) 0.005 % ophthalmic solution Place 1 drop into both eyes at bedtime. 04/13/18  Yes [provider]  losartan (COZAAR) 50 MG tablet Take 1 tablet (50 mg total) by mouth daily. 11/25/21  Yes Skeet Latch, MD  Omega-3 Fatty Acids (OMEGA 3 500 PO) Take 1 capsule by mouth daily.   Yes [provider]  pantoprazole (PROTONIX) 40 MG tablet Take 40 mg by mouth daily.  Yes [provider]  potassium chloride SA (K-DUR,KLOR-CON) 20 MEQ tablet Take 2 tablets (40 mEq total) by mouth 2 (two) times daily. Patient taking differently: Take 20 mEq by mouth 2 (two) times daily. Take 2 in the AM and 1 in the PM 05/03/18  Yes Reubin Milan, MD  rosuvastatin (CRESTOR) 20 MG tablet TAKE 1 TABLET BY MOUTH DAILY 03/09/22  Yes Skeet Latch, MD  Saccharomyces boulardii (PROBIOTIC) 250 MG CAPS Take 1 capsule by mouth daily.   Yes [provider]  Turmeric 500 MG CAPS  Take by mouth daily.   Yes [provider]  Spacer/Aero-Holding Chambers (AEROCHAMBER MV) inhaler Use as instructed Patient not taking: Reported on 06/28/2022 10/27/21   Rigoberto Noel, MD  traMADol (ULTRAM) 50 MG tablet Take 50 mg by mouth every 8 (eight) hours as needed. Patient not taking: Reported on 06/28/2022 05/17/22   [provider]     Family History  Problem Relation Age of Onset   Lung disease Father    Coronary artery disease Brother    Breast cancer Neg Hx     Social History   Socioeconomic History   Marital status: Married    Spouse name: Not on file   Number of children: Not on file   Years of education: Not on file   Highest education level: Not on file  Occupational History   Not on file  Tobacco Use   Smoking status: Former    Packs/day: 1.00    Years: 10.00    Total pack years: 10.00    Types: Cigarettes    Quit date: 06/17/1994    Years since quitting: 28.0    Passive exposure: Past   Smokeless tobacco: Never  Vaping Use   Vaping Use: Never used  Substance and Sexual Activity   Alcohol use: Not Currently    Alcohol/week: 0.0 standard drinks of alcohol   Drug use: No   Sexual activity: Not Currently  Other Topics Concern   Not on file  Social History Narrative   Not on file   Social Determinants of Health   Financial Resource Strain: Not on file  Food Insecurity: Not on file  Transportation Needs: Not on file  Physical Activity: Not on file  Stress: Not on file  Social Connections: Not on file     Review of Systems: A 12 point ROS discussed and pertinent positives are indicated in the HPI above.  All other systems are negative.  Review of Systems  Vital Signs: BP (!) 154/70   Pulse 68   Temp 98 F (36.7 C) (Oral)   Resp 18   Ht '5\' 6"'$  (1.676 m)   Wt 207 lb 3.7 oz (94 kg)   SpO2 98%   BMI 33.45 kg/m     Physical Exam Constitutional:      Appearance: Normal appearance.  HENT:     Mouth/Throat:     Mouth:  Mucous membranes are moist.  Cardiovascular:     Rate and Rhythm: Normal rate and regular rhythm.     Heart sounds: Normal heart sounds.  Pulmonary:     Effort: Pulmonary effort is normal.     Breath sounds: Normal breath sounds.  Skin:    General: Skin is warm and dry.  Neurological:     General: No focal deficit present.     Mental Status: She is alert and oriented to person, place, and time.  Psychiatric:        Mood and  Affect: Mood normal.        Thought Content: Thought content normal.     Imaging: US RENAL  Result Date: 07/05/2022 CLINICAL DATA:  Evaluate for renal enlargement. EXAM: RENAL / URINARY TRACT ULTRASOUND COMPLETE COMPARISON:  CT abdomen pelvis Dec 01, 2016 FINDINGS: Right Kidney: Renal measurements: 8.8 x 5.1 x 6.0 cm = volume: 142.6 mL. Echogenicity within normal limits. No mass or hydronephrosis visualized. Left Kidney: Renal measurements: 9.1 x 5.0 x 4.4 cm = volume: 105.3 mL. Echogenicity within normal limits. No mass or hydronephrosis visualized. Bladder: Appears normal for degree of bladder distention. Other: None. IMPRESSION: No hydronephrosis. Electronically Signed   By: Lovey Newcomer M.D.   On: 07/05/2022 10:52    Labs:  CBC: Recent Labs    05/31/22 0954 06/28/22 1034 07/12/22 0654  WBC 5.8 4.9 5.7  HGB 12.7 11.5* 12.0  HCT 38.0 35.4* 38.0  PLT 120* 148* 88*    COAGS: No results for input(s): "INR", "APTT" in the last 8760 hours.  BMP: Recent Labs    08/24/21 1512 12/06/21 1109 05/31/22 0954 06/28/22 1034  NA 142 143 143 142  K 3.0* 4.1 4.3 3.7  CL 104 104 104 105  CO2 31 25 19* 27  GLUCOSE 67* 103* 110* 118*  BUN '18 9 13 13  '$ CALCIUM 9.0 8.9 9.2 8.8*  CREATININE 0.91 0.91 0.96 0.91  GFRNONAA  --   --   --  >60    LIVER FUNCTION TESTS: Recent Labs    05/31/22 0954 06/28/22 1034  BILITOT 0.6 0.7  AST 24 15  ALT 8 9  ALKPHOS 138* 108  PROT 6.9 6.6  ALBUMIN 4.4 4.3    TUMOR MARKERS: No results for input(s): "AFPTM", "CEA",  "CA199", "CHROMGRNA" in the last 8760 hours.  Assessment and Plan: Conjunctival Amyloidosis Hx of lymphoma For bone marrow biopsy to rule out systemic disease Risks and benefits of bone marrow biopsy was discussed with the patient and/or patient's family including, but not limited to bleeding, infection, damage to adjacent structures or low yield requiring additional tests.  All of the questions were answered and there is agreement to proceed.  Consent signed and in chart.   Thank you for this interesting consult.  I greatly enjoyed meeting [name] and look forward to participating in their care.  A copy of this report was sent to the requesting provider on this date.  Electronically Signed: Ascencion Dike, PA-C 07/12/2022, 8:18 AM   I spent a total of 20 minutes in face to face in clinical consultation, greater than 50% of which was counseling/coordinating care for bone marrow biopsy

## 2022-07-12 NOTE — Procedures (Signed)
Interventional Radiology Procedure Note  Procedure: CT guided aspirate and core biopsy of right iliac bone  Complications: None  Recommendations: - Bedrest supine x 1 hrs - Hydrocodone PRN  Pain - Follow biopsy results   Alailah Safley, MD   

## 2022-07-12 NOTE — Sedation Documentation (Signed)
Sample obtained 

## 2022-07-15 ENCOUNTER — Telehealth: Payer: Self-pay | Admitting: *Deleted

## 2022-07-15 LAB — SURGICAL PATHOLOGY

## 2022-07-15 NOTE — Telephone Encounter (Signed)
-----  Message from Volanda Napoleon, MD sent at 07/15/2022  1:36 PM EST ----- Please call and let her know that the bone marrow is normal.  There is no evidence of any cancer or myeloma or blood disorder.  This is fantastic news.  Thanks.  Laurey Arrow

## 2022-07-15 NOTE — Telephone Encounter (Signed)
Message received from patient concerned that her platelet count on 07/12/22 was 88.  Call placed back to patient and patient notified per order of Dr. Marin Olp that he is not concerned about the platelet count and will have her return to the office in one month for repeat lab work and to see Dr. Marin Olp and that "the bone marrow is normal. There is no evidence of any cancer or myeloma or blood disorder.  This is fantastic news.  Thanks.  Pete"  Pt is appreciative of call and has no questions or concerns at this time.  Message sent to scheduling.

## 2022-07-18 ENCOUNTER — Encounter (HOSPITAL_COMMUNITY): Payer: Self-pay | Admitting: Hematology & Oncology

## 2022-07-22 ENCOUNTER — Encounter (HOSPITAL_COMMUNITY): Payer: Self-pay | Admitting: Hematology & Oncology

## 2022-07-25 ENCOUNTER — Ambulatory Visit (HOSPITAL_BASED_OUTPATIENT_CLINIC_OR_DEPARTMENT_OTHER): Payer: Medicare Other | Admitting: Family

## 2022-07-26 ENCOUNTER — Ambulatory Visit (HOSPITAL_BASED_OUTPATIENT_CLINIC_OR_DEPARTMENT_OTHER): Payer: Medicare Other | Admitting: Family

## 2022-07-26 ENCOUNTER — Encounter (HOSPITAL_BASED_OUTPATIENT_CLINIC_OR_DEPARTMENT_OTHER): Payer: Self-pay | Admitting: Family

## 2022-07-26 VITALS — BP 124/64 | HR 78 | Ht 66.0 in | Wt 210.3 lb

## 2022-07-26 DIAGNOSIS — I5032 Chronic diastolic (congestive) heart failure: Secondary | ICD-10-CM

## 2022-07-26 DIAGNOSIS — Z01810 Encounter for preprocedural cardiovascular examination: Secondary | ICD-10-CM

## 2022-07-26 DIAGNOSIS — R0609 Other forms of dyspnea: Secondary | ICD-10-CM | POA: Diagnosis not present

## 2022-07-26 DIAGNOSIS — I10 Essential (primary) hypertension: Secondary | ICD-10-CM

## 2022-07-26 DIAGNOSIS — E782 Mixed hyperlipidemia: Secondary | ICD-10-CM

## 2022-07-26 NOTE — Progress Notes (Signed)
Office Visit    Patient Name: [name] Date of Encounter: 07/26/2022  PCP:  Merrilee Seashore, [place]  Cardiologist:  Skeet Latch, MD  Advanced Practice Provider:  No care team member to display Electrophysiologist:  None   Chief Complaint    [name] is a 72 y.o. female presents today for follow up after cardiac testing.   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 2022 - right   MASS EXCISION     back   rortator     rotator cuff   TOTAL SHOULDER ARTHROPLASTY Left 08/16/2018   Procedure: LEFT SHOULDER REVERSE TOTAL SHOULDER ARTHROPLASTY AND BICEPS TENOTOMY;  Surgeon: Tania Ade, MD;  Location: [place];  Service: Orthopedics;  Laterality: Left;    Allergies  Allergies  Allergen Reactions   Iodinated Contrast Media Other (See Comments)    Kidney failure.    Ciprofloxacin Itching   E-Mycin [Erythromycin Base] Nausea And Vomiting   Oxycodone Other (See Comments)    Hallucination.    History of Present Illness    [name] is a 72 y.o. female with a hx of prior stroke, rheumatoid arthritis, lupus, interstitial lung disease, chronic diastolic heart failure, hyperlipidemia, hypertension, prior lymphoplasmacytoid lymphoma, asthma last seen  05/23/2022.  Prior echocardiogram 01/2018 LVEF 60 to 65% with grade 2 diastolic dysfunction.  Seen in ED 10/22 with chest pain.  EKG revealed diffuse T wave inversion.  Cardiac enzymes negative X.3.  Outpatient Lexiscan Myoview 05/2018 LVEF 68%, no ischemia.  Episode of near-syncope 03/2019 metoprolol was discontinued.  Implantable loop recorder placed 12/2019 by Dr. Sallyanne Kuster.  Carotid Dopplers revealed mild stenosis.  She did fall 08/2020 with no associated events on ILR.  Recurrent fall 02/2021 walking into her bathroom and turning her head.  No preceding lightheadedness, dizziness.  She had right hip replacement 03/2021 with some postoperative lower extremity edema.  Her loop recorder was extracted 12/2021.  She last saw Dr. Oval Linsey 05/31/2022.  She notes shortness of breath with minimal exertion as well as fatigue beginning several months prior.  She noted she saw her ophthalmologist recently was told she may have recurrent lymphomas and was awaiting biopsy.  Subsequent echocardiogram revealed normal LVEF, mild MR, aortic valve sclerosis without stenosis, elevated LVEDP which was similar to previous.  Myoview 06/06/2022 low risk with no evidence of ischemia.  She saw Dr. 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  Renal duplex 07/05/2022 normal with no changes associated with amyloid.  Bone marrow biopsy performed 07/12/2022. No recurrent cancer found.  She presents today for follow-up. She is pending excision of conjunctival lesion 08/31/22. Still  notes exertional dyspnea. Reviewed reassuring echocardiogram and myoview.  Reports no chest pain, pressure, or tightness. Rare pain in her left breast described as sharp often when laying down. Discussed possible etiology muscle spasm vs coronary artery spasm vs gas. No edema, orthopnea, PND. Reports no palpitations.  She is restricting to less than 2 L of fluid intake per day and following a low-salt  diet.  EKGs/Labs/Other Studies Reviewed:   The following studies were reviewed today:  Myoview 06/06/22   The study is normal. The study is low risk.   No ST deviation was noted. The ECG was not diagnostic due to pharmacologic protocol.   LV perfusion is normal. There is no evidence of ischemia. There is no evidence of infarction.   Left ventricular function is normal. Nuclear stress EF: 64 %. The left ventricular ejection fraction is normal (55-65%). End diastolic cavity size is normal. End systolic cavity size is normal.   Prior study available for comparison from 05/08/2018. No changes compared to prior study.   Echo 06/02/22  1. Left ventricular ejection fraction, by estimation, is 65 to 70%. The  left ventricle has normal function. The left ventricle has no regional  wall motion abnormalities. Left ventricular diastolic parameters are  indeterminate. Elevated left ventricular  end-diastolic pressure. The average left ventricular global longitudinal  strain is -16.6 %. The global longitudinal strain is normal.   2. Right ventricular systolic function is normal. The right ventricular  size is normal. There is normal pulmonary artery systolic pressure.   3. The mitral valve is normal in structure. Mild mitral valve  regurgitation. No evidence of mitral stenosis.   4. The aortic valve is tricuspid. There is mild calcification of the  aortic valve. Aortic valve regurgitation is not visualized. No aortic  stenosis is present.   5. The inferior vena cava is normal in size with greater than 50%  respiratory variability, suggesting right atrial pressure of 3 mmHg.  Event Monitor 10/2019: 30 Day Event Monitor   Quality: Fair.  Baseline artifact. Predominant rhythm: sinus rhythm Average heart rate: 61 bpm Max heart rate: 106 bpm Min heart rate: 58 bpm   No arrhythmias   Bilateral Carotid Dopplers 08/21/2019: Summary:  Right Carotid: Velocities in the right ICA are consistent with a  1-39%  stenosis.   Left Carotid: Velocities in the left ICA are consistent with a 1-39%  stenosis.   Vertebrals:  Bilateral vertebral arteries demonstrate antegrade flow.  Subclavians: Normal flow hemodynamics were seen in bilateral subclavian arteries.    Lexiscan Myoview 05/07/18: Nuclear stress EF: 68%. There was no ST segment deviation noted during stress. No T wave inversion was noted during stress. The study is normal. This is a low risk study. The left ventricular ejection fraction is hyperdynamic (>65%).   Echo 01/29/18: Study Conclusions - Left ventricle: The cavity size was normal. Wall thickness was   normal. Systolic function was normal. The estimated ejection   fraction was in the range of 60% to 65%. Wall motion was normal;   there were no regional wall motion abnormalities. Features are   consistent with a pseudonormal left ventricular filling pattern,   with concomitant abnormal relaxation and increased filling   pressure (grade 2 diastolic dysfunction). - Mitral valve: Calcified annulus. There was mild regurgitation. - Pericardium, extracardiac: A trivial pericardial effusion was   identified.   Impressions: - Normal LV systolic function; moderate diastolic dysfunction;   mild MR.    EKG:  EKG is not ordered  today.    Recent Labs: 08/24/2021: Pro B Natriuretic peptide (BNP) 213.0 12/06/2021: BNP 110.8 06/28/2022: ALT 9; BUN 13; Creatinine 0.91; Potassium 3.7; Sodium 142 07/12/2022: Hemoglobin 12.0; Platelets 88  Recent Lipid Panel    Component Value Date/Time   CHOL 167 05/31/2022 0954   TRIG 66 05/31/2022 0954   HDL 89 05/31/2022 0954   CHOLHDL 1.9 05/31/2022 0954   LDLCALC 65 05/31/2022 0954    Home Medications   Current Meds  Medication Sig   acetaminophen (TYLENOL) 500 MG tablet Take 1,000 mg by mouth every 6 (six) hours as needed for mild pain or headache.   albuterol (VENTOLIN HFA) 108 (90 Base) MCG/ACT inhaler Inhale 2 puffs into the lungs  every 6 (six) hours as needed for wheezing or shortness of breath.   aspirin 81 MG chewable tablet Chew by mouth daily.   Biotin 1000 MCG tablet Take 1,000 mcg by mouth daily.   cholecalciferol (VITAMIN D) 1000 UNITS tablet Take 1,000 Units by mouth daily.    fluorometholone (FML) 0.1 % ophthalmic suspension Place 1 drop into the right eye 2 (two) times daily.   fluticasone-salmeterol (ADVAIR HFA) 230-21 MCG/ACT inhaler Inhale 2 puffs into the lungs 2 (two) times daily.   furosemide (LASIX) 80 MG tablet TAKE ONE AND ONE-HALF TABLETS BY MOUTH DAILY. NEW DOSE   hydroxychloroquine (PLAQUENIL) 200 MG tablet Take 200 mg by mouth daily.   latanoprost (XALATAN) 0.005 % ophthalmic solution Place 1 drop into both eyes at bedtime.   losartan (COZAAR) 50 MG tablet Take 1 tablet (50 mg total) by mouth daily.   Omega-3 Fatty Acids (OMEGA 3 500 PO) Take 1 capsule by mouth daily.   pantoprazole (PROTONIX) 40 MG tablet Take 40 mg by mouth daily.   potassium chloride SA (K-DUR,KLOR-CON) 20 MEQ tablet Take 2 tablets (40 mEq total) by mouth 2 (two) times daily. (Patient taking differently: Take 20 mEq by mouth 2 (two) times daily. Take 2 in the AM and 1 in the PM)   rosuvastatin (CRESTOR) 20 MG tablet TAKE 1 TABLET BY MOUTH DAILY   Saccharomyces boulardii (PROBIOTIC) 250 MG CAPS Take 1 capsule by mouth daily.   Spacer/Aero-Holding Chambers (AEROCHAMBER MV) inhaler Use as instructed   traMADol (ULTRAM) 50 MG tablet Take 50 mg by mouth every 8 (eight) hours as needed.   Turmeric 500 MG CAPS Take by mouth daily.   Current Facility-Administered Medications for the 07/26/22 encounter (Office Visit) with Loel Dubonnet, NP  Medication   lidocaine-EPINEPHrine (XYLOCAINE W/EPI) 1 %-1:100000 (with pres) injection 10 mL   lidocaine-EPINEPHrine (XYLOCAINE W/EPI) 1 %-1:100000 (with pres) injection 15 mL     Review of Systems      All other systems reviewed and are otherwise negative except as noted above.  Physical  Exam    VS:  BP 124/64   Pulse 78   Ht '5\' 6"'$  (1.676 m)   Wt 210 lb 4.8 oz (95.4 kg)   SpO2 98%   BMI 33.94 kg/m  , BMI Body mass index is 33.94 kg/m.  Wt Readings from Last 3 Encounters:  07/26/22 210 lb 4.8 oz (95.4 kg)  07/12/22 207 lb 3.7 oz (94 kg)  06/28/22 209 lb 1.9 oz (94.9 kg)     GEN: Well nourished, overweight, well developed, in no acute distress. HEENT: normal. Neck: Supple, no JVD, carotid bruits, or masses. Cardiac: RRR, no murmurs, rubs, or gallops. No clubbing, cyanosis, edema.  Radials/PT 2+ and equal bilaterally.  Respiratory:  Respirations  regular and unlabored, clear to auscultation bilaterally. GI: Soft, nontender, nondistended. MS: No deformity or atrophy. Skin: Warm and dry, no rash. Neuro:  Strength and sensation are intact. Psych: Normal affect.  Assessment & Plan    Preoperative evaluation - Pending excision of conjuctival lesion 08/31/22.  Myoview 06/06/2022 low risk study.  Per AHA/ACC guidelines, she is deemed acceptable risk for the planned procedure without additional cardiovascular testing. Will route to surgical team so they are aware.    HTN- BP well controlled. Continue current antihypertensive regimen.    Exertional dyspnea- Echo 06/02/22 normal LVEF, elevated LVEDP, mild MR. Myoview 06/06/22 low risk. Likely etiology deconditioning. Reports no new weight gain or edema suggestive of volume overload. Refer to PREP exercise program. Plans to participate after her eye surgery in February.    Chronic diastolic heart failure- Euvolemic and well compensated on exam. Continue furosemide 120 mg daily. Low sodium diet, fluid restriction <2L, and daily weights encouraged. Educated to contact our office for weight gain of 2 lbs overnight or 5 lbs in one week. Refer to PREP exercise program.  Hyperlipidemia-05/2022 LDL 65.  Continue atorvastatin, aspirin due to history of CVA.  Atypical chest pain - Notes an occasional sharp pain in her left breast  predominantly at rest or when laying down.  Is overall not bothersome.  Discussed possible etiology gas versus muscle spasm versus coronary artery spasm.  As symptoms are infrequent we will continue to monitor.  Her Myoview 06/2022 was low risk study.  We discussed possible trial of low-dose amlodipine to rule out coronary spasm but she prefers to continue to monitor at this time.         Disposition: Follow up  in 3-4 months  with Skeet Latch, MD or APP.  Signed, Loel Dubonnet, NP 07/26/2022, 9:57 AM [place]

## 2022-07-26 NOTE — Patient Instructions (Signed)
Medication Instructions:  Continue your current medications.   *If you need a refill on your cardiac medications before your next appointment, please call your pharmacy*   Lab Work/Testing/Procedures: Your echocardiogram and stress test looked great!  Follow-Up: At [facility], you and your health needs are our priority.  As part of our continuing mission to provide you with exceptional heart care, we have created designated Provider Care Teams.  These Care Teams include your primary Cardiologist (physician) and Advanced Practice Providers (APPs -  Physician Assistants and Nurse Practitioners) who all work together to provide you with the care you need, when you need it.  We recommend signing up for the patient portal called "MyChart".  Sign up information is provided on this After Visit Summary.  MyChart is used to connect with patients for Virtual Visits (Telemedicine).  Patients are able to view lab/test results, encounter notes, upcoming appointments, etc.  Non-urgent messages can be sent to your provider as well.   To learn more about what you can do with MyChart, go to [email or web address].    Your next appointment:   3-4 month(s)  Provider:   Skeet Latch, MD or Laurann Montana, NP    Other Instructions  Heart Healthy Diet Recommendations: A low-salt diet is recommended. Meats should be grilled, baked, or boiled. Avoid fried foods. Focus on lean protein sources like fish or chicken with vegetables and fruits. The American Heart Association is a Microbiologist!  American Heart Association Diet and Lifeystyle Recommendations   Exercise recommendations: The American Heart Association recommends 150 minutes of moderate intensity exercise weekly. Try 30 minutes of moderate intensity exercise 4-5 times per week. This could include walking, jogging, or swimming.

## 2022-08-01 ENCOUNTER — Telehealth: Payer: Self-pay

## 2022-08-01 DIAGNOSIS — H16121 Filamentary keratitis, right eye: Secondary | ICD-10-CM | POA: Diagnosis not present

## 2022-08-01 DIAGNOSIS — H40003 Preglaucoma, unspecified, bilateral: Secondary | ICD-10-CM | POA: Diagnosis not present

## 2022-08-01 DIAGNOSIS — C8300 Small cell B-cell lymphoma, unspecified site: Secondary | ICD-10-CM | POA: Diagnosis not present

## 2022-08-01 DIAGNOSIS — H119 Unspecified disorder of conjunctiva: Secondary | ICD-10-CM | POA: Diagnosis not present

## 2022-08-01 DIAGNOSIS — Z79899 Other long term (current) drug therapy: Secondary | ICD-10-CM | POA: Diagnosis not present

## 2022-08-01 NOTE — Telephone Encounter (Signed)
Call from pt inquiring about PREP referral. Received. Having eye surgery 2/28. Would like to start after. Wants and am class.  Next 4290P MW class will be in April.  Will call her closer to the start of class to do intake.  Pt agreeable to plan.

## 2022-08-01 NOTE — Progress Notes (Signed)
The bone marrow biopsy was done because I needed to see if she had any recurrence of her lymphoma.  She has a past history of lymphoplasmacytic lymphoma.  I needed to see if this was recurring.  There is no other test that we could do to find out if this was the case.  Thanks.  [name]

## 2022-08-02 DIAGNOSIS — E782 Mixed hyperlipidemia: Secondary | ICD-10-CM | POA: Diagnosis not present

## 2022-08-02 DIAGNOSIS — I10 Essential (primary) hypertension: Secondary | ICD-10-CM | POA: Diagnosis not present

## 2022-08-02 DIAGNOSIS — N182 Chronic kidney disease, stage 2 (mild): Secondary | ICD-10-CM | POA: Diagnosis not present

## 2022-08-02 DIAGNOSIS — R7303 Prediabetes: Secondary | ICD-10-CM | POA: Diagnosis not present

## 2022-08-03 DIAGNOSIS — M47816 Spondylosis without myelopathy or radiculopathy, lumbar region: Secondary | ICD-10-CM | POA: Diagnosis not present

## 2022-08-09 DIAGNOSIS — M064 Inflammatory polyarthropathy: Secondary | ICD-10-CM | POA: Diagnosis not present

## 2022-08-09 DIAGNOSIS — R7303 Prediabetes: Secondary | ICD-10-CM | POA: Diagnosis not present

## 2022-08-09 DIAGNOSIS — M329 Systemic lupus erythematosus, unspecified: Secondary | ICD-10-CM | POA: Diagnosis not present

## 2022-08-09 DIAGNOSIS — M3501 Sicca syndrome with keratoconjunctivitis: Secondary | ICD-10-CM | POA: Diagnosis not present

## 2022-08-09 DIAGNOSIS — C8300 Small cell B-cell lymphoma, unspecified site: Secondary | ICD-10-CM | POA: Diagnosis not present

## 2022-08-09 DIAGNOSIS — Z23 Encounter for immunization: Secondary | ICD-10-CM | POA: Diagnosis not present

## 2022-08-12 ENCOUNTER — Other Ambulatory Visit: Payer: Self-pay | Admitting: *Deleted

## 2022-08-12 DIAGNOSIS — C8300 Small cell B-cell lymphoma, unspecified site: Secondary | ICD-10-CM

## 2022-08-15 ENCOUNTER — Inpatient Hospital Stay: Payer: Medicare Other | Admitting: Hematology & Oncology

## 2022-08-15 ENCOUNTER — Encounter: Payer: Self-pay | Admitting: Nurse Practitioner

## 2022-08-15 ENCOUNTER — Inpatient Hospital Stay: Payer: Medicare Other | Attending: Hematology & Oncology

## 2022-08-15 ENCOUNTER — Other Ambulatory Visit: Payer: Self-pay

## 2022-08-15 ENCOUNTER — Ambulatory Visit: Payer: Medicare Other | Admitting: Nurse Practitioner

## 2022-08-15 ENCOUNTER — Encounter: Payer: Self-pay | Admitting: Hematology & Oncology

## 2022-08-15 VITALS — BP 124/72 | HR 71 | Ht 66.0 in | Wt 213.4 lb

## 2022-08-15 VITALS — BP 153/58 | HR 76 | Temp 98.4°F | Resp 17 | Ht 66.0 in | Wt 211.0 lb

## 2022-08-15 DIAGNOSIS — C8300 Small cell B-cell lymphoma, unspecified site: Secondary | ICD-10-CM

## 2022-08-15 DIAGNOSIS — J309 Allergic rhinitis, unspecified: Secondary | ICD-10-CM | POA: Insufficient documentation

## 2022-08-15 DIAGNOSIS — J3089 Other allergic rhinitis: Secondary | ICD-10-CM | POA: Diagnosis not present

## 2022-08-15 DIAGNOSIS — J4540 Moderate persistent asthma, uncomplicated: Secondary | ICD-10-CM | POA: Diagnosis not present

## 2022-08-15 DIAGNOSIS — E8589 Other amyloidosis: Secondary | ICD-10-CM | POA: Diagnosis not present

## 2022-08-15 DIAGNOSIS — Z8572 Personal history of non-Hodgkin lymphomas: Secondary | ICD-10-CM | POA: Diagnosis not present

## 2022-08-15 LAB — CMP (CANCER CENTER ONLY)
ALT: 13 U/L (ref 0–44)
AST: 22 U/L (ref 15–41)
Albumin: 3.6 g/dL (ref 3.5–5.0)
Alkaline Phosphatase: 145 U/L — ABNORMAL HIGH (ref 38–126)
Anion gap: 6 (ref 5–15)
BUN: 16 mg/dL (ref 8–23)
CO2: 24 mmol/L (ref 22–32)
Calcium: 9.1 mg/dL (ref 8.9–10.3)
Chloride: 111 mmol/L (ref 98–111)
Creatinine: 1.03 mg/dL — ABNORMAL HIGH (ref 0.44–1.00)
GFR, Estimated: 57 mL/min — ABNORMAL LOW (ref >60)
Glucose, Bld: 115 mg/dL — ABNORMAL HIGH (ref 70–99)
Potassium: 4.4 mmol/L (ref 3.5–5.1)
Sodium: 141 mmol/L (ref 135–145)
Total Bilirubin: 0.6 mg/dL (ref 0.3–1.2)
Total Protein: 6.8 g/dL (ref 6.5–8.1)

## 2022-08-15 LAB — CBC WITH DIFFERENTIAL (CANCER CENTER ONLY)
Abs Immature Granulocytes: 0.01 10*3/uL (ref 0.00–0.07)
Basophils Absolute: 0 10*3/uL (ref 0.0–0.1)
Basophils Relative: 0 %
Eosinophils Absolute: 0.1 10*3/uL (ref 0.0–0.5)
Eosinophils Relative: 1 %
HCT: 37.1 % (ref 36.0–46.0)
Hemoglobin: 11.5 g/dL — ABNORMAL LOW (ref 12.0–15.0)
Immature Granulocytes: 0 %
Lymphocytes Relative: 34 %
Lymphs Abs: 2.6 10*3/uL (ref 0.7–4.0)
MCH: 29.6 pg (ref 26.0–34.0)
MCHC: 31 g/dL (ref 30.0–36.0)
MCV: 95.4 fL (ref 80.0–100.0)
Monocytes Absolute: 0.5 10*3/uL (ref 0.1–1.0)
Monocytes Relative: 7 %
Neutro Abs: 4.4 10*3/uL (ref 1.7–7.7)
Neutrophils Relative %: 58 %
Platelet Count: 135 10*3/uL — ABNORMAL LOW (ref 150–400)
RBC: 3.89 MIL/uL (ref 3.87–5.11)
RDW: 13.7 % (ref 11.5–15.5)
WBC Count: 7.6 10*3/uL (ref 4.0–10.5)
nRBC: 0 % (ref 0.0–0.2)

## 2022-08-15 NOTE — Assessment & Plan Note (Signed)
Compensated on current regimen. No exacerbations since August 2023. We will continue her on ICS/LABA therapy. Could consider de-escalating to lower ICS dosing in the future. We will keep her at current dose due to some residual wheezing/cough. Encouraged her to remain active. Advised she get the RSV vaccine, which she was agreeable to. Risks/benefits reviewed. Action plan in place.   Patient Instructions  Continue Advair 2 puffs Twice daily. Brush tongue and rinse mouth afterwards  Continue Albuterol inhaler 2 puffs every 6 hours as needed for shortness of breath or wheezing. Notify if symptoms persist despite rescue inhaler/neb use.  Continue protonix 40 mg daily   Start daily over the counter allergy pill such as Zyrtec, Claritin or Xyzal around 2 weeks before allergy season this spring  Recommend you get your RSV vaccine    Follow up in four months with Dr. Elsworth Soho or Alanson Aly. If symptoms worsen, please contact office for sooner follow up or seek emergency care.

## 2022-08-15 NOTE — Patient Instructions (Signed)
Continue Advair 2 puffs Twice daily. Brush tongue and rinse mouth afterwards  Continue Albuterol inhaler 2 puffs every 6 hours as needed for shortness of breath or wheezing. Notify if symptoms persist despite rescue inhaler/neb use.  Continue protonix 40 mg daily   Start daily over the counter allergy pill such as Zyrtec, Claritin or Xyzal around 2 weeks before allergy season this spring  Recommend you get your RSV vaccine    Follow up in four months with Dr. Elsworth Soho or Alanson Aly. If symptoms worsen, please contact office for sooner follow up or seek emergency care.

## 2022-08-15 NOTE — Progress Notes (Signed)
$@[email or web address]$  ID: [name], female    DOB: 02-11-1951, 72 y.o.   MRN: [id number]  Chief Complaint  Patient presents with   Follow-up    Pt f/u she reports she has been doing well some wheezing but nothing substantial.     Referring provider: Merrilee Seashore, MD  HPI: 72 year old female, former smoker followed for asthma and shortness of breath.  She is a patient of Dr. Bari Mantis and last seen in office 04/12/2022.  Past medical history significant for hypertension, HFpEF, lymphoma 2008, SLE on Plaquenil, syncope (loop recorder in place), carotid stenosis, GERD, arthritis, obesity.  She is followed by Dr. Otho Ket with rheumatology.  TEST/EVENTS:  11/2016 CT abdomen: Shows mild chronic bibasilar pulmonary scarring, likely postinflammatory 08/2021 PFTs: FEV1 61%, FVC 57%, ratio 83%, 17% improvement in FVC with albuterol. 01/20/2022 CXR 2 view: lungs are clear.   04/12/2022: OV with Dr. Elsworth Soho. Seen in August and treated for slow to resolve asthma exacerbation. Symptoms improved but she continued to have some residual nasal congestion, sinus pain and ear pressure so she was treated with augmentin. Improved today. Will continue Advair HFA. Use OTC antihistamine during allergy seasons.  08/15/2022: Today - follow up Patient presents today for follow up. She has been doing well since she was here last. Her breathing has been stable. She does have some occasional wheezing but this resolves on it's own. She has a rare cough, that usually is non-productive. She has not required any steroids or abx for her breathing since August. No hospitalizations. She rarely uses her rescue inhaler. She is up to date on flu and COVID vaccines. Has not gotten her RSV vaccine yet.   Allergies  Allergen Reactions   Iodinated Contrast Media Other (See Comments)    Kidney failure.    Ciprofloxacin Itching   E-Mycin [Erythromycin Base] Nausea And Vomiting   Oxycodone Other (See Comments)    Hallucination.     Immunization History  Administered Date(s) Administered   Fluad Quad(high Dose 65+) 04/12/2022   Influenza, High Dose Seasonal PF 06/11/2014, 04/14/2015, 05/16/2016   Influenza, Quadrivalent, Recombinant, Inj, Pf 03/21/2017, 04/10/2018, 02/22/2019, 04/10/2020, 05/04/2021   Influenza-Unspecified 06/08/2011, 04/05/2012, 05/18/2012, 05/30/2012, 05/24/2013   PFIZER(Purple Top)SARS-COV-2 Vaccination 08/16/2019, 09/10/2019, 04/03/2020   PNEUMOCOCCAL CONJUGATE-20 11/09/2021   Pneumococcal Conjugate-13 09/12/2017   Pneumococcal Polysaccharide-23 05/27/2002, 10/17/2012, 08/17/2018, 10/11/2019   Tdap 04/05/2012   Zoster Recombinat (Shingrix) 11/02/2021, 03/22/2022    Past Medical History:  Diagnosis Date   Acid reflux    Arthritis    Asthma    Carotid stenosis 10/29/2020   Chronic diastolic heart failure ([place]) 08/30/2019   Gait instability 10/29/2020   Hypertension    Lupus ([place]) 07/13/2011   Malignant lymphoma, 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   Near syncope 08/30/2019   PONV (postoperative nausea and vomiting)    Pure hypercholesterolemia 05/31/2022   Stroke ([place])    lt side weakness    Tobacco History: Social History   Tobacco Use  Smoking Status Former   Packs/day: 1.00   Years: 10.00   Total pack years: 10.00   Types: Cigarettes   Quit date: 06/17/1994   Years since quitting: 28.1   Passive exposure: Past  Smokeless Tobacco Never   Counseling given: Not Answered   Outpatient Medications Prior to Visit  Medication Sig Dispense Refill   acetaminophen (TYLENOL) 500 MG tablet Take 1,000 mg by mouth every 6 (six) hours as needed for mild pain or headache.     albuterol (VENTOLIN HFA) 108 (90 Base)  MCG/ACT inhaler Inhale 2 puffs into the lungs every 6 (six) hours as needed for wheezing or shortness of breath. 8 g 6   aspirin 81 MG chewable tablet Chew by mouth daily.     Biotin 1000 MCG tablet Take 1,000 mcg by mouth daily.     cholecalciferol (VITAMIN D) 1000  UNITS tablet Take 1,000 Units by mouth daily.      fluorometholone (FML) 0.1 % ophthalmic suspension Place 1 drop into the right eye 2 (two) times daily.     fluticasone-salmeterol (ADVAIR HFA) 230-21 MCG/ACT inhaler Inhale 2 puffs into the lungs 2 (two) times daily. 1 each 5   furosemide (LASIX) 80 MG tablet TAKE ONE AND ONE-HALF TABLETS BY MOUTH DAILY. NEW DOSE 135 tablet 1   hydroxychloroquine (PLAQUENIL) 200 MG tablet Take 200 mg by mouth daily.     latanoprost (XALATAN) 0.005 % ophthalmic solution Place 1 drop into both eyes at bedtime.     losartan (COZAAR) 50 MG tablet Take 1 tablet (50 mg total) by mouth daily. 90 tablet 3   pantoprazole (PROTONIX) 40 MG tablet Take 40 mg by mouth daily.     potassium chloride SA (K-DUR,KLOR-CON) 20 MEQ tablet Take 2 tablets (40 mEq total) by mouth 2 (two) times daily. (Patient taking differently: Take 20 mEq by mouth 2 (two) times daily. Take 2 in the AM and 1 in the PM)     rosuvastatin (CRESTOR) 20 MG tablet TAKE 1 TABLET BY MOUTH DAILY 90 tablet 2   Saccharomyces boulardii (PROBIOTIC) 250 MG CAPS Take 1 capsule by mouth daily.     Spacer/Aero-Holding Chambers (AEROCHAMBER MV) inhaler Use as instructed 1 each 0   Turmeric 500 MG CAPS Take by mouth daily.     Omega-3 Fatty Acids (OMEGA 3 500 PO) Take 1 capsule by mouth daily. (Patient not taking: Reported on 08/15/2022)     traMADol (ULTRAM) 50 MG tablet Take 50 mg by mouth every 8 (eight) hours as needed. (Patient not taking: Reported on 08/15/2022)     Facility-Administered Medications Prior to Visit  Medication Dose Route Frequency Provider Last Rate Last Admin   lidocaine-EPINEPHrine (XYLOCAINE W/EPI) 1 %-1:100000 (with pres) injection 10 mL  10 mL Infiltration Once Croitoru, Mihai, MD       lidocaine-EPINEPHrine (XYLOCAINE W/EPI) 1 %-1:100000 (with pres) injection 15 mL  15 mL Infiltration Once Croitoru, Mihai, MD         Review of Systems:   Constitutional: No weight loss or gain, night sweats,  fevers, chills, fatigue, or lassitude. HEENT: No headaches, difficulty swallowing, tooth/dental problems. No sneezing, ear ache. No nasal congestion/drainage, postnasal drip, sore throat CV:  No chest pain, orthopnea, PND, swelling in lower extremities, anasarca, dizziness, palpitations, syncope Resp: +rare wheeze; occasional dry cough. No shortness of breath with exertion or at rest. No hemoptysis. No chest wall deformity GI:  No heartburn, indigestion, abdominal pain, nausea, vomiting, diarrhea, change in bowel habits, loss of appetite, bloody stools.  Skin: No rash, lesions, ulcerations MSK:  No joint pain or swelling.  No decreased range of motion.  No back pain. Neuro: No dizziness or lightheadedness.  Psych: No depression or anxiety. Mood stable.     Physical Exam:  BP 124/72   Pulse 71   Ht 5' 6"$  (1.676 m)   Wt 213 lb 6.4 oz (96.8 kg)   SpO2 100%   BMI 34.44 kg/m   GEN: Pleasant, interactive, well-appearing; obese; in no acute distress. HEENT:  Normocephalic and atraumatic. PERRLA.  Left sclera white. Right sclera injected (chronic). Nasal turbinates pink, moist and patent bilaterally. Oropharynx pink and moist, without exudate or edema. No lesions, ulcerations NECK:  Supple w/ fair ROM. No JVD present. Normal carotid impulses w/o bruits. Thyroid symmetrical with no goiter or nodules palpated. No lymphadenopathy.   CV: RRR, no m/r/g, no peripheral edema. Pulses intact, +2 bilaterally. No cyanosis, pallor or clubbing. PULMONARY:  Unlabored, regular breathing. Clear bilaterally A&P w/o wheezes/rales/rhonchi. No accessory muscle use. No dullness to percussion. GI: BS present and normoactive. Soft, non-tender to palpation. No organomegaly or masses detected.  MSK: No erythema, warmth or tenderness. Cap refil <2 sec all extrem. No deformities or joint swelling noted.  Neuro: A/Ox3. No focal deficits noted.   Skin: Warm, no lesions or rashe Psych: Normal affect and behavior. Judgement  and thought content appropriate.     Lab Results:  CBC    Component Value Date/Time   WBC 5.7 07/12/2022 0654   RBC 4.06 07/12/2022 0654   HGB 12.0 07/12/2022 0654   HGB 11.5 (L) 06/28/2022 1034   HGB 12.7 05/31/2022 0954   HGB 12.2 05/18/2015 1256   HGB 9.6 (L) 12/17/2007 1011   HCT 38.0 07/12/2022 0654   HCT 38.0 05/31/2022 0954   HCT 37.6 05/18/2015 1256   HCT 27.7 (L) 12/17/2007 1011   PLT 88 (L) 07/12/2022 0654   PLT 148 (L) 06/28/2022 1034   PLT 120 (L) 05/31/2022 0954   MCV 93.6 07/12/2022 0654   MCV 91 05/31/2022 0954   MCV 93 05/18/2015 1256   MCV 98.3 12/17/2007 1011   MCH 29.6 07/12/2022 0654   MCHC 31.6 07/12/2022 0654   RDW 13.2 07/12/2022 0654   RDW 13.1 05/31/2022 0954   RDW 13.4 05/18/2015 1256   RDW 19.7 (H) 12/17/2007 1011   LYMPHSABS 2.3 07/12/2022 0654   LYMPHSABS 2.0 05/31/2022 0954   LYMPHSABS 1.1 05/18/2015 1256   LYMPHSABS 0.6 (L) 12/17/2007 1011   MONOABS 0.5 07/12/2022 0654   MONOABS 0.5 12/17/2007 1011   EOSABS 0.1 07/12/2022 0654   EOSABS 0.2 05/31/2022 0954   EOSABS 0.0 05/18/2015 1256   BASOSABS 0.0 07/12/2022 0654   BASOSABS 0.0 05/31/2022 0954   BASOSABS 0.0 05/18/2015 1256   BASOSABS 0.0 12/17/2007 1011    BMET    Component Value Date/Time   NA 142 06/28/2022 1034   NA 143 05/31/2022 0954   NA 142 05/18/2015 1257   K 3.7 06/28/2022 1034   K 3.6 05/18/2015 1257   CL 105 06/28/2022 1034   CL 104 01/15/2013 0844   CO2 27 06/28/2022 1034   CO2 26 05/18/2015 1257   GLUCOSE 118 (H) 06/28/2022 1034   GLUCOSE 90 05/18/2015 1257   GLUCOSE 103 01/15/2013 0844   BUN 13 06/28/2022 1034   BUN 13 05/31/2022 0954   BUN 12.6 05/18/2015 1257   CREATININE 0.91 06/28/2022 1034   CREATININE 0.8 05/18/2015 1257   CALCIUM 8.8 (L) 06/28/2022 1034   CALCIUM 9.1 05/18/2015 1257   GFRNONAA >60 06/28/2022 1034   GFRAA 75 01/30/2020 1241    BNP    Component Value Date/Time   BNP 110.8 (H) 12/06/2021 1109     Imaging:  No results  found.        Latest Ref Rng & Units 09/03/2021    9:55 AM  PFT Results  FVC-Pre L 1.47   FVC-Predicted Pre % 57   FVC-Post L 1.65   FVC-Predicted Post % 65   Pre FEV1/FVC % %  83   Post FEV1/FCV % % 86   FEV1-Pre L 1.22   FEV1-Predicted Pre % 61   FEV1-Post L 1.42     Lab Results  Component Value Date   NITRICOXIDE 31 02/10/2022        Assessment & Plan:   Asthma Compensated on current regimen. No exacerbations since August 2023. We will continue her on ICS/LABA therapy. Could consider de-escalating to lower ICS dosing in the future. We will keep her at current dose due to some residual wheezing/cough. Encouraged her to remain active. Advised she get the RSV vaccine, which she was agreeable to. Risks/benefits reviewed. Action plan in place.   Patient Instructions  Continue Advair 2 puffs Twice daily. Brush tongue and rinse mouth afterwards  Continue Albuterol inhaler 2 puffs every 6 hours as needed for shortness of breath or wheezing. Notify if symptoms persist despite rescue inhaler/neb use.  Continue protonix 40 mg daily   Start daily over the counter allergy pill such as Zyrtec, Claritin or Xyzal around 2 weeks before allergy season this spring  Recommend you get your RSV vaccine    Follow up in four months with Dr. Elsworth Soho or Alanson Aly. If symptoms worsen, please contact office for sooner follow up or seek emergency care.    Allergic rhinitis Flares with allergy seasons. She will start OTC antihistamine 2 weeks prior to onset of upcoming spring season. Continue intranasal steroid.      I spent 28 minutes of dedicated to the care of this patient on the date of this encounter to include pre-visit review of records, face-to-face time with the patient discussing conditions above, post visit ordering of testing, clinical documentation with the electronic health record, making appropriate referrals as documented, and communicating necessary findings to members of the  patients care team.  Clayton Bibles, NP 08/15/2022  Pt aware and understands NP's role.

## 2022-08-15 NOTE — Progress Notes (Signed)
Hematology and Oncology Follow Up Visit  ASHWINI TA [id number] [birth year] 72 y.o. 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 is back for her second office visit.  We saw her back in late December.  At that time, she was having problems with her right eye.  She had been seen by ophthalmology at [facility].  She had a biopsy which showed amyloidosis.  Because of this, she was referred to the [facility] for an evaluation.  We do the ultimate test on her.  This was a bone marrow biopsy.  This was done on 07/12/2022.  The pathology report [id number]) did not show any evidence of any hematologic malignancy.  There is no amyloid deposit in the bone marrow.  She did have a protein electrophoresis that was done.  This showed a monoclonal spike of 0.3 g/dL.  She had a normal IgG level.  She had a low IgM and IgA level.  She had normal light chains in her serum.  A 24-hour urine that was done which did not show any Bence-Jones protein in the urine.  So far, we have not found any evidence of a lymphoplasmacytic disorder or a plasmacytic disorder.  I suspect that this might be a primary amyloid issue.  She apparently is going to have ophthalmologic surgery in a couple weeks.  She is bothered by back issues.  I think she had an epidural injection which seemed to work for 1 side of her back.  She has had no fever.  She has had no change in bowel or bladder habits.  There has been no cough or shortness of breath.  She has had no nausea or vomiting.  Overall, I would say that her performance status is probably ECOG 1.  Medications:  Current Outpatient Medications:    acetaminophen (TYLENOL) 500 MG tablet, Take 1,000 mg by mouth every 6 (six) hours as needed for mild pain or headache., Disp: , Rfl:    albuterol (VENTOLIN HFA) 108 (90 Base) MCG/ACT  inhaler, Inhale 2 puffs into the lungs every 6 (six) hours as needed for wheezing or shortness of breath., Disp: 8 g, Rfl: 6   aspirin 81 MG chewable tablet, Chew by mouth daily., Disp: , Rfl:    Biotin 1000 MCG tablet, Take 1,000 mcg by mouth daily., Disp: , Rfl:    cholecalciferol (VITAMIN D) 1000 UNITS tablet, Take 1,000 Units by mouth daily. , Disp: , Rfl:    fluorometholone (FML) 0.1 % ophthalmic suspension, Place 1 drop into the right eye 2 (two) times daily., Disp: , Rfl:    fluticasone-salmeterol (ADVAIR HFA) 230-21 MCG/ACT inhaler, Inhale 2 puffs into the lungs 2 (two) times daily., Disp: 1 each, Rfl: 5   furosemide (LASIX) 80 MG tablet, TAKE ONE AND ONE-HALF TABLETS BY MOUTH DAILY. NEW DOSE, Disp: 135 tablet, Rfl: 1   hydroxychloroquine (PLAQUENIL) 200 MG tablet, Take 200 mg by mouth daily., Disp: , Rfl:    latanoprost (XALATAN) 0.005 % ophthalmic solution, Place 1 drop into both eyes at bedtime., Disp: , Rfl:    losartan (COZAAR) 50 MG tablet, Take 1 tablet (50 mg total) by mouth daily., Disp: 90 tablet, Rfl: 3   Omega-3 Fatty Acids (OMEGA 3 500 PO), Take 1 capsule by mouth daily. (Patient not taking: Reported on 08/15/2022), Disp: , Rfl:    pantoprazole (PROTONIX) 40 MG tablet, Take 40  mg by mouth daily., Disp: , Rfl:    potassium chloride SA (K-DUR,KLOR-CON) 20 MEQ tablet, Take 2 tablets (40 mEq total) by mouth 2 (two) times daily. (Patient taking differently: Take 20 mEq by mouth 2 (two) times daily. Take 2 in the AM and 1 in the PM), Disp: , Rfl:    rosuvastatin (CRESTOR) 20 MG tablet, TAKE 1 TABLET BY MOUTH DAILY, Disp: 90 tablet, Rfl: 2   Saccharomyces boulardii (PROBIOTIC) 250 MG CAPS, Take 1 capsule by mouth daily., Disp: , Rfl:    Spacer/Aero-Holding Chambers (AEROCHAMBER MV) inhaler, Use as instructed, Disp: 1 each, Rfl: 0   traMADol (ULTRAM) 50 MG tablet, Take 50 mg by mouth every 8 (eight) hours as needed. (Patient not taking: Reported on 08/15/2022), Disp: , Rfl:    Turmeric  500 MG CAPS, Take by mouth daily., Disp: , Rfl:   Current Facility-Administered Medications:    lidocaine-EPINEPHrine (XYLOCAINE W/EPI) 1 %-1:100000 (with pres) injection 10 mL, 10 mL, Infiltration, Once, [name], Mihai, MD   lidocaine-EPINEPHrine (XYLOCAINE W/EPI) 1 %-1:100000 (with pres) injection 15 mL, 15 mL, Infiltration, Once, [name], Mihai, MD  Allergies:  Allergies  Allergen Reactions   Iodinated Contrast Media Other (See Comments)    Kidney failure.    Ciprofloxacin Itching   E-Mycin [Erythromycin Base] Nausea And Vomiting   Oxycodone Other (See Comments)    Hallucination.    Past Medical History, Surgical history, Social history, and Family History were reviewed and updated.  Review of Systems: Review of Systems  Constitutional: Negative.   HENT:  Negative.    Eyes:  Positive for eye problems.  Respiratory: Negative.    Cardiovascular: Negative.   Gastrointestinal: Negative.   Endocrine: Negative.   Genitourinary: Negative.    Musculoskeletal:  Positive for back pain.  Neurological: Negative.   Hematological: Negative.   Psychiatric/Behavioral: Negative.      Physical Exam:  height is 5' 6"$  (1.676 m) and weight is 211 lb (95.7 kg). Her oral temperature is 98.4 F (36.9 C). Her blood pressure is 153/58 (abnormal) and her pulse is 76. Her respiration is 17 and oxygen saturation is 99%.   Wt Readings from Last 3 Encounters:  08/15/22 211 lb (95.7 kg)  08/15/22 213 lb 6.4 oz (96.8 kg)  07/26/22 210 lb 4.8 oz (95.4 kg)    Physical Exam Vitals reviewed.  HENT:     Head: Normocephalic and atraumatic.  Eyes:     Pupils: Pupils are equal, round, and reactive to light.     Comments: She has some slight ptosis of the right eye.  Her extraocular muscles are intact.  Cardiovascular:     Rate and Rhythm: Normal rate and regular rhythm.     Heart sounds: Normal heart sounds.  Pulmonary:     Effort: Pulmonary effort is normal.     Breath sounds: Normal breath  sounds.  Abdominal:     General: Bowel sounds are normal.     Palpations: Abdomen is soft.  Musculoskeletal:        General: No tenderness or deformity. Normal range of motion.     Cervical back: Normal range of motion.  Lymphadenopathy:     Cervical: No cervical adenopathy.  Skin:    General: Skin is warm and dry.     Findings: No erythema or rash.  Neurological:     Mental Status: She is alert and oriented to person, place, and time.  Psychiatric:        Behavior: Behavior normal.  Thought Content: Thought content normal.        Judgment: Judgment normal.      Lab Results  Component Value Date   WBC 7.6 08/15/2022   HGB 11.5 (L) 08/15/2022   HCT 37.1 08/15/2022   MCV 95.4 08/15/2022   PLT 135 (L) 08/15/2022     Chemistry      Component Value Date/Time   NA 142 06/28/2022 1034   NA 143 05/31/2022 0954   NA 142 05/18/2015 1257   K 3.7 06/28/2022 1034   K 3.6 05/18/2015 1257   CL 105 06/28/2022 1034   CL 104 01/15/2013 0844   CO2 27 06/28/2022 1034   CO2 26 05/18/2015 1257   BUN 13 06/28/2022 1034   BUN 13 05/31/2022 0954   BUN 12.6 05/18/2015 1257   CREATININE 0.91 06/28/2022 1034   CREATININE 0.8 05/18/2015 1257      Component Value Date/Time   CALCIUM 8.8 (L) 06/28/2022 1034   CALCIUM 9.1 05/18/2015 1257   ALKPHOS 108 06/28/2022 1034   ALKPHOS 122 05/18/2015 1257   AST 15 06/28/2022 1034   AST 17 05/18/2015 1257   ALT 9 06/28/2022 1034   ALT 13 05/18/2015 1257   BILITOT 0.7 06/28/2022 1034   BILITOT 0.72 05/18/2015 1257      Impression and Plan: Ms. Gottschall is a very nice 72 year old African-American female.  We had seen her in the remote past with a lymphoplasmacytic lymphoma.  She was treated for this back in March 2010.  She has been in remission since.  Again, I cannot find any malignancy in it with her.  I suspect that we might be looking at a primary amyloid ophthalmologic issue.  I just cannot imagine any other test that we would need to  do on her.  For right now, I think we can definitely move her appointments out a little bit longer.  I would like to plan to get her back to see me in another 4 months.    Volanda Napoleon, MD 2/12/20242:05 PM

## 2022-08-15 NOTE — Assessment & Plan Note (Signed)
[place]res with allergy seasons. She will start OTC antihistamine 2 weeks prior to onset of upcoming spring season. Continue intranasal steroid.

## 2022-08-23 DIAGNOSIS — N182 Chronic kidney disease, stage 2 (mild): Secondary | ICD-10-CM | POA: Insufficient documentation

## 2022-08-23 DIAGNOSIS — J4520 Mild intermittent asthma, uncomplicated: Secondary | ICD-10-CM | POA: Insufficient documentation

## 2022-08-23 DIAGNOSIS — M064 Inflammatory polyarthropathy: Secondary | ICD-10-CM | POA: Insufficient documentation

## 2022-08-23 DIAGNOSIS — J4541 Moderate persistent asthma with (acute) exacerbation: Secondary | ICD-10-CM | POA: Insufficient documentation

## 2022-08-23 DIAGNOSIS — D696 Thrombocytopenia, unspecified: Secondary | ICD-10-CM | POA: Insufficient documentation

## 2022-08-31 DIAGNOSIS — H11011 Amyloid pterygium of right eye: Secondary | ICD-10-CM | POA: Diagnosis not present

## 2022-08-31 DIAGNOSIS — E854 Organ-limited amyloidosis: Secondary | ICD-10-CM | POA: Diagnosis not present

## 2022-08-31 DIAGNOSIS — R011 Cardiac murmur, unspecified: Secondary | ICD-10-CM | POA: Diagnosis not present

## 2022-08-31 DIAGNOSIS — H119 Unspecified disorder of conjunctiva: Secondary | ICD-10-CM | POA: Diagnosis not present

## 2022-08-31 HISTORY — PX: EYE SURGERY: SHX253

## 2022-09-01 DIAGNOSIS — H1131 Conjunctival hemorrhage, right eye: Secondary | ICD-10-CM | POA: Diagnosis not present

## 2022-09-01 DIAGNOSIS — Z48810 Encounter for surgical aftercare following surgery on the sense organs: Secondary | ICD-10-CM | POA: Diagnosis not present

## 2022-09-01 DIAGNOSIS — M3210 Systemic lupus erythematosus, organ or system involvement unspecified: Secondary | ICD-10-CM | POA: Diagnosis not present

## 2022-09-01 DIAGNOSIS — H2511 Age-related nuclear cataract, right eye: Secondary | ICD-10-CM | POA: Diagnosis not present

## 2022-09-01 DIAGNOSIS — Z79899 Other long term (current) drug therapy: Secondary | ICD-10-CM | POA: Diagnosis not present

## 2022-09-01 DIAGNOSIS — Z9889 Other specified postprocedural states: Secondary | ICD-10-CM | POA: Diagnosis not present

## 2022-09-01 DIAGNOSIS — Z8669 Personal history of other diseases of the nervous system and sense organs: Secondary | ICD-10-CM | POA: Diagnosis not present

## 2022-09-01 DIAGNOSIS — H16121 Filamentary keratitis, right eye: Secondary | ICD-10-CM | POA: Diagnosis not present

## 2022-09-01 DIAGNOSIS — Z91041 Radiographic dye allergy status: Secondary | ICD-10-CM | POA: Diagnosis not present

## 2022-09-01 DIAGNOSIS — Z885 Allergy status to narcotic agent status: Secondary | ICD-10-CM | POA: Diagnosis not present

## 2022-09-01 DIAGNOSIS — Z881 Allergy status to other antibiotic agents status: Secondary | ICD-10-CM | POA: Diagnosis not present

## 2022-09-01 DIAGNOSIS — H02401 Unspecified ptosis of right eyelid: Secondary | ICD-10-CM | POA: Diagnosis not present

## 2022-09-01 DIAGNOSIS — H40003 Preglaucoma, unspecified, bilateral: Secondary | ICD-10-CM | POA: Diagnosis not present

## 2022-09-02 HISTORY — PX: REPLACEMENT TOTAL HIP W/  RESURFACING IMPLANTS: SUR1222

## 2022-09-08 DIAGNOSIS — Z48810 Encounter for surgical aftercare following surgery on the sense organs: Secondary | ICD-10-CM | POA: Diagnosis not present

## 2022-09-08 DIAGNOSIS — Z9889 Other specified postprocedural states: Secondary | ICD-10-CM | POA: Diagnosis not present

## 2022-09-08 DIAGNOSIS — H16121 Filamentary keratitis, right eye: Secondary | ICD-10-CM | POA: Diagnosis not present

## 2022-09-08 DIAGNOSIS — H119 Unspecified disorder of conjunctiva: Secondary | ICD-10-CM | POA: Diagnosis not present

## 2022-09-09 DIAGNOSIS — M47816 Spondylosis without myelopathy or radiculopathy, lumbar region: Secondary | ICD-10-CM | POA: Diagnosis not present

## 2022-09-15 DIAGNOSIS — Z96642 Presence of left artificial hip joint: Secondary | ICD-10-CM | POA: Diagnosis not present

## 2022-09-20 DIAGNOSIS — M79643 Pain in unspecified hand: Secondary | ICD-10-CM | POA: Diagnosis not present

## 2022-09-20 DIAGNOSIS — M3501 Sicca syndrome with keratoconjunctivitis: Secondary | ICD-10-CM | POA: Diagnosis not present

## 2022-09-20 DIAGNOSIS — M359 Systemic involvement of connective tissue, unspecified: Secondary | ICD-10-CM | POA: Diagnosis not present

## 2022-09-20 DIAGNOSIS — Z79899 Other long term (current) drug therapy: Secondary | ICD-10-CM | POA: Diagnosis not present

## 2022-09-20 DIAGNOSIS — M79642 Pain in left hand: Secondary | ICD-10-CM | POA: Diagnosis not present

## 2022-09-20 DIAGNOSIS — M79641 Pain in right hand: Secondary | ICD-10-CM | POA: Diagnosis not present

## 2022-09-20 DIAGNOSIS — M25531 Pain in right wrist: Secondary | ICD-10-CM | POA: Diagnosis not present

## 2022-10-06 DIAGNOSIS — H119 Unspecified disorder of conjunctiva: Secondary | ICD-10-CM | POA: Diagnosis not present

## 2022-10-06 DIAGNOSIS — H524 Presbyopia: Secondary | ICD-10-CM | POA: Diagnosis not present

## 2022-10-06 DIAGNOSIS — H16121 Filamentary keratitis, right eye: Secondary | ICD-10-CM | POA: Diagnosis not present

## 2022-10-06 DIAGNOSIS — H40003 Preglaucoma, unspecified, bilateral: Secondary | ICD-10-CM | POA: Diagnosis not present

## 2022-10-07 ENCOUNTER — Telehealth: Payer: Self-pay

## 2022-10-07 NOTE — Telephone Encounter (Signed)
Call from pt reference starting PREP on 10/17/22.  Confirmed class start and location of [facility].  Intake scheduled for 10/10/22 at 10a. Will meet pt at front desk. She has my number for contact.

## 2022-10-10 NOTE — Progress Notes (Signed)
[facility] PREP Evaluation  Patient Details  Name: [name] MRN: [id number] Date of Birth: January 30, 1951 Age: 72 y.o. PCP: Georgianne Fickamachandran, Ajith, MD  Vitals:   10/10/22 1000  BP: (!) 158/72  Pulse: 81  SpO2: 97%  Weight: 215 lb 12.8 oz (97.9 kg)     [facility] Eval - 10/10/22 1100       [facility] "PREP" Location   [facility] "PREP" Location [facility]      Referral    Referring Provider Walker    Reason for referral Hypertension;Orthopedic;Obesitity/Overweight;Inactivity;Other   Lupus effection joints per patient   Program Start Date 10/17/22   MW [id number] x 12 wks     Measurement   Waist Circumference 47 inches    Hip Circumference 46 inches    Body fat 44.4 percent      Information for Trainer   Goals better balance, less SOB when walking, lose wt: 24 lbs, walk farther    Current Exercise none    Orthopedic Concerns Knees, hips, back surg hx    Pertinent Medical History HTN, Lupus, arthritis, hx of lymphoma    Current Barriers none    Restrictions/Precautions Fall risk;Assistive device   fell at home in bathroom-loss of balance, uses cane   Medications that affect exercise Medication causing dizziness/drowsiness      Timed Up and Go (TUGS)   Timed Up and Go Moderate risk 10-12 seconds      Mobility and Daily Activities   I find it easy to walk up or down two or more flights of stairs. 1    I have no trouble taking out the trash. 4    I do housework such as vacuuming and dusting on my own without difficulty. 4    I can easily lift a gallon of milk (8lbs). 4    I can easily walk a mile. 2    I have no trouble reaching into high cupboards or reaching down to pick up something from the floor. 2    I do not have trouble doing out-door work such as Loss adjuster, charteredmoving the lawn, raking leaves, or gardening. 1      Mobility and Daily Activities   I feel younger than my age. 4    I feel independent. 4    I feel energetic. 2    I live an active life.  2    I feel strong. 4    I feel healthy.  4    I feel active as other people my age. 2      How fit and strong are you.   Fit and Strong Total Score 40            Past Medical History:  Diagnosis Date   Acid reflux    Arthritis    Asthma    Carotid stenosis 10/29/2020   Chronic diastolic heart failure (HCC) 08/30/2019   Gait instability 10/29/2020   Hypertension    Lupus (HCC) 07/13/2011   Malignant lymphoma, 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   Near syncope 08/30/2019   PONV (postoperative nausea and vomiting)    Pure hypercholesterolemia 05/31/2022   Stroke (HCC)    lt side weakness   Past Surgical History:  Procedure Laterality Date   ABDOMINAL HYSTERECTOMY     BACK SURGERY     CHOLECYSTECTOMY     COLONOSCOPY     FOOT SURGERY     x2   JOINT REPLACEMENT Bilateral 2023   Hip replacements March 2023 - left and Sept.  2022 - right   MASS EXCISION     back   rortator     rotator cuff   TOTAL SHOULDER ARTHROPLASTY Left 08/16/2018   Procedure: LEFT SHOULDER REVERSE TOTAL SHOULDER ARTHROPLASTY AND BICEPS TENOTOMY;  Surgeon: Jones Broom, MD;  Location: [facility] OR;  Service: Orthopedics;  Laterality: Left;   Social History   Tobacco Use  Smoking Status Former   Packs/day: 1.00   Years: 10.00   Additional pack years: 0.00   Total pack years: 10.00   Types: Cigarettes   Quit date: 06/17/1994   Years since quitting: 28.3   Passive exposure: Past  Smokeless Tobacco Never    Bonnye Fava 10/10/2022, 11:13 AM

## 2022-10-17 ENCOUNTER — Other Ambulatory Visit (HOSPITAL_BASED_OUTPATIENT_CLINIC_OR_DEPARTMENT_OTHER): Payer: Self-pay | Admitting: Cardiovascular Disease

## 2022-10-17 NOTE — Telephone Encounter (Signed)
Rx(s) sent to pharmacy electronically.  

## 2022-10-18 ENCOUNTER — Telehealth (HOSPITAL_BASED_OUTPATIENT_CLINIC_OR_DEPARTMENT_OTHER): Payer: Self-pay

## 2022-10-18 MED ORDER — LOSARTAN POTASSIUM 50 MG PO TABS: 50.0000 mg | ORAL_TABLET | Freq: Every day | ORAL | 3 refills | Status: DC

## 2022-10-18 NOTE — Telephone Encounter (Signed)
Received fax from [facility] stating Rx for Losartan had expired. Sent in new order of Losartan 50 mg QD.

## 2022-10-20 NOTE — Progress Notes (Signed)
[facility] PREP Weekly Session  Patient Details  Name: [name] MRN: [id number] Date of Birth: 17-Jul-1950 Age: 72 y.o. PCP: Georgianne Fick, MD  There were no vitals filed for this visit.   [facility] Weekly seesion - 10/20/22 1600       [facility] "PREP" Location   [facility]PREP[facility] Location [facility]      Weekly Session   Topic Discussed Goal setting and welcome to the program   Fit testing   Classes attended to date 2             Bonnye Fava 10/20/2022, 4:00 PM

## 2022-10-24 DIAGNOSIS — R7303 Prediabetes: Secondary | ICD-10-CM | POA: Diagnosis not present

## 2022-10-24 DIAGNOSIS — K5901 Slow transit constipation: Secondary | ICD-10-CM | POA: Diagnosis not present

## 2022-10-24 DIAGNOSIS — M064 Inflammatory polyarthropathy: Secondary | ICD-10-CM | POA: Diagnosis not present

## 2022-10-24 DIAGNOSIS — I5032 Chronic diastolic (congestive) heart failure: Secondary | ICD-10-CM | POA: Diagnosis not present

## 2022-10-24 DIAGNOSIS — I10 Essential (primary) hypertension: Secondary | ICD-10-CM | POA: Diagnosis not present

## 2022-10-24 DIAGNOSIS — H8110 Benign paroxysmal vertigo, unspecified ear: Secondary | ICD-10-CM | POA: Diagnosis not present

## 2022-10-24 DIAGNOSIS — R296 Repeated falls: Secondary | ICD-10-CM | POA: Diagnosis not present

## 2022-10-24 DIAGNOSIS — N182 Chronic kidney disease, stage 2 (mild): Secondary | ICD-10-CM | POA: Diagnosis not present

## 2022-10-26 ENCOUNTER — Other Ambulatory Visit: Payer: Self-pay | Admitting: Cardiovascular Disease

## 2022-10-26 NOTE — Telephone Encounter (Signed)
Rx(s) sent to pharmacy electronically.  

## 2022-11-01 ENCOUNTER — Ambulatory Visit (HOSPITAL_BASED_OUTPATIENT_CLINIC_OR_DEPARTMENT_OTHER): Payer: Medicare Other | Admitting: Family

## 2022-11-02 NOTE — Progress Notes (Signed)
[facility] PRE[facility] Weekly Session  Patient Details  Name: [name] MRN: [id number] Date of Birth: 09-Aug-1950 Age: 72 y.o. PCP: Georgianne Fick, MD  Vitals:   10/31/22 1030  Weight: 212 lb 12.8 oz (96.5 kg)     [facility] Weekly seesion - 11/02/22 0900       [facility] "PREP" Location   [facility]PREP[facility] Location [facility]      Weekly Session   Topic Discussed Healthy eating tips   keep added sugars below 24 g   Classes attended to date 3             Bonnye Fava 11/02/2022, 9:33 AM

## 2022-11-09 NOTE — Progress Notes (Signed)
[facility] PRE[facility] Weekly Session  Patient Details  Name: [name] MRN: [id number] Date of Birth: 07-21-1950 Age: 72 y.o. PCP: Georgianne Fick, MD  Vitals:   11/07/22 1030  Weight: 215 lb 9.6 oz (97.8 kg)     [facility] Weekly seesion - 11/09/22 1500       [facility] "PREP" Location   [facility] "PREP[facility] Location [facility]      Weekly Session   Topic Discussed Health habits    Minutes exercised this week 165 minutes    Classes attended to date 6            Class held on 11/07/22 Bonnye Fava 11/09/2022, 3:41 PM

## 2022-11-29 ENCOUNTER — Other Ambulatory Visit: Payer: Self-pay | Admitting: Internal Medicine

## 2022-11-29 DIAGNOSIS — Z1231 Encounter for screening mammogram for malignant neoplasm of breast: Secondary | ICD-10-CM

## 2022-12-06 DIAGNOSIS — M47816 Spondylosis without myelopathy or radiculopathy, lumbar region: Secondary | ICD-10-CM | POA: Diagnosis not present

## 2022-12-06 DIAGNOSIS — R7303 Prediabetes: Secondary | ICD-10-CM | POA: Diagnosis not present

## 2022-12-06 DIAGNOSIS — M329 Systemic lupus erythematosus, unspecified: Secondary | ICD-10-CM | POA: Diagnosis not present

## 2022-12-06 DIAGNOSIS — I10 Essential (primary) hypertension: Secondary | ICD-10-CM | POA: Diagnosis not present

## 2022-12-06 DIAGNOSIS — E782 Mixed hyperlipidemia: Secondary | ICD-10-CM | POA: Diagnosis not present

## 2022-12-06 DIAGNOSIS — I5032 Chronic diastolic (congestive) heart failure: Secondary | ICD-10-CM | POA: Diagnosis not present

## 2022-12-06 DIAGNOSIS — C8300 Small cell B-cell lymphoma, unspecified site: Secondary | ICD-10-CM | POA: Diagnosis not present

## 2022-12-06 DIAGNOSIS — M3501 Sicca syndrome with keratoconjunctivitis: Secondary | ICD-10-CM | POA: Diagnosis not present

## 2022-12-06 DIAGNOSIS — M064 Inflammatory polyarthropathy: Secondary | ICD-10-CM | POA: Diagnosis not present

## 2022-12-06 NOTE — Progress Notes (Signed)
[facility] PREP Weekly Session  Patient Details  Name: [name] MRN: [id number] Date of Birth: [birth year] Age: 72 y.o. PCP: Georgianne Fick, MD  Vitals:   12/05/22 1037  Weight: 209 lb 9.6 oz (95.1 kg)     [facility] Weekly seesion - 12/06/22 1000       [facility] "PREP" Location   [facility] "PREP[facility] Location [facility]      Weekly Session   Topic Discussed Expectations and non-scale victories    Minutes exercised this week 350 minutes    Classes attended to date 10             Bonnye Fava 12/06/2022, 10:38 AM

## 2022-12-12 NOTE — Progress Notes (Signed)
[facility] PREP Weekly Session  Patient Details  Name: IMOJEAN YOSHINO MRN: [id number] Date of Birth: 1951-03-31 Age: 72 y.o. PCP: Georgianne Fick, MD  Vitals:   12/12/22 1030  Weight: 210 lb 9.6 oz (95.5 kg)     [facility] Weekly seesion - 12/12/22 1200       [facility] "PREP" Location   [facility]PREP[facility] Location [facility]      Weekly Session   Topic Discussed --   portions   Minutes exercised this week 210 minutes    Classes attended to date 12             Bonnye Fava 12/12/2022, 12:41 PM

## 2022-12-13 ENCOUNTER — Encounter: Payer: Self-pay | Admitting: Hematology & Oncology

## 2022-12-13 ENCOUNTER — Inpatient Hospital Stay: Payer: Medicare Other | Admitting: Hematology & Oncology

## 2022-12-13 ENCOUNTER — Inpatient Hospital Stay: Payer: Medicare Other | Attending: Hematology & Oncology

## 2022-12-13 ENCOUNTER — Other Ambulatory Visit: Payer: Self-pay

## 2022-12-13 VITALS — BP 154/52 | HR 66 | Temp 97.9°F | Resp 18 | Ht 66.0 in | Wt 209.0 lb

## 2022-12-13 DIAGNOSIS — M329 Systemic lupus erythematosus, unspecified: Secondary | ICD-10-CM | POA: Diagnosis not present

## 2022-12-13 DIAGNOSIS — C8300 Small cell B-cell lymphoma, unspecified site: Secondary | ICD-10-CM

## 2022-12-13 DIAGNOSIS — Z0000 Encounter for general adult medical examination without abnormal findings: Secondary | ICD-10-CM | POA: Diagnosis not present

## 2022-12-13 DIAGNOSIS — R1310 Dysphagia, unspecified: Secondary | ICD-10-CM | POA: Diagnosis not present

## 2022-12-13 DIAGNOSIS — M3501 Sicca syndrome with keratoconjunctivitis: Secondary | ICD-10-CM | POA: Diagnosis not present

## 2022-12-13 DIAGNOSIS — E8589 Other amyloidosis: Secondary | ICD-10-CM | POA: Insufficient documentation

## 2022-12-13 LAB — LACTATE DEHYDROGENASE: LDH: 157 U/L (ref 98–192)

## 2022-12-13 LAB — CMP (CANCER CENTER ONLY)
ALT: 9 U/L (ref 0–44)
AST: 17 U/L (ref 15–41)
Albumin: 4.1 g/dL (ref 3.5–5.0)
Alkaline Phosphatase: 107 U/L (ref 38–126)
Anion gap: 8 (ref 5–15)
BUN: 12 mg/dL (ref 8–23)
CO2: 27 mmol/L (ref 22–32)
Calcium: 9.4 mg/dL (ref 8.9–10.3)
Chloride: 105 mmol/L (ref 98–111)
Creatinine: 0.83 mg/dL (ref 0.44–1.00)
GFR, Estimated: >60 mL/min (ref >60)
Glucose, Bld: 99 mg/dL (ref 70–99)
Potassium: 4 mmol/L (ref 3.5–5.1)
Sodium: 140 mmol/L (ref 135–145)
Total Bilirubin: 0.7 mg/dL (ref 0.3–1.2)
Total Protein: 6.7 g/dL (ref 6.5–8.1)

## 2022-12-13 LAB — CBC WITH DIFFERENTIAL (CANCER CENTER ONLY)
Abs Immature Granulocytes: 0.01 10*3/uL (ref 0.00–0.07)
Basophils Absolute: 0 10*3/uL (ref 0.0–0.1)
Basophils Relative: 0 %
Eosinophils Absolute: 0.1 10*3/uL (ref 0.0–0.5)
Eosinophils Relative: 2 %
HCT: 36.8 % (ref 36.0–46.0)
Hemoglobin: 11.6 g/dL — ABNORMAL LOW (ref 12.0–15.0)
Immature Granulocytes: 0 %
Lymphocytes Relative: 37 %
Lymphs Abs: 1.9 10*3/uL (ref 0.7–4.0)
MCH: 28.9 pg (ref 26.0–34.0)
MCHC: 31.5 g/dL (ref 30.0–36.0)
MCV: 91.8 fL (ref 80.0–100.0)
Monocytes Absolute: 0.4 10*3/uL (ref 0.1–1.0)
Monocytes Relative: 8 %
Neutro Abs: 2.7 10*3/uL (ref 1.7–7.7)
Neutrophils Relative %: 53 %
Platelet Count: 125 10*3/uL — ABNORMAL LOW (ref 150–400)
RBC: 4.01 MIL/uL (ref 3.87–5.11)
RDW: 13.6 % (ref 11.5–15.5)
WBC Count: 5.1 10*3/uL (ref 4.0–10.5)
nRBC: 0 % (ref 0.0–0.2)

## 2022-12-13 NOTE — Progress Notes (Signed)
Hematology and Oncology Follow Up Visit  [name] [id number] 09-16-1950 72 y.o. 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  She is doing okay.  She did have surgery for the right eye.  Everything seemed to go fairly well with this.  She said that there is still a little bit of irritation with the right eye.  Otherwise, she has had no problems with her back.  She actually drove to [place] to see her grandson graduate high school.  She was happy and proud that she could drive that far.  There has been no issues with nausea or vomiting.  She has had no change in bowel or bladder habits.  She has had no cough or shortness of breath.  She has had no problems with bleeding.  Overall, I would have said that her performance status is probably ECOG 1.   She has had no fever.  She has had no change in bowel or bladder habits.  There has been no cough or shortness of breath.  She has had no nausea or vomiting.  Overall, I would say that her performance status is probably ECOG 1.  Medications:  Current Outpatient Medications:    acetaminophen (TYLENOL) 500 MG tablet, Take 1,000 mg by mouth every 6 (six) hours as needed for mild pain or headache., Disp: , Rfl:    albuterol (VENTOLIN HFA) 108 (90 Base) MCG/ACT inhaler, Inhale 2 puffs into the lungs every 6 (six) hours as needed for wheezing or shortness of breath., Disp: 8 g, Rfl: 6   aspirin 81 MG chewable tablet, Chew by mouth daily., Disp: , Rfl:    Biotin 1000 MCG tablet, Take 1,000 mcg by mouth daily., Disp: , Rfl:    cholecalciferol (VITAMIN D) 1000 UNITS tablet, Take 1,000 Units by mouth daily. , Disp: , Rfl:    fluorometholone (FML) 0.1 % ophthalmic suspension, Place 1 drop into the right eye 2 (two) times daily., Disp: , Rfl:    fluticasone-salmeterol (ADVAIR HFA)  230-21 MCG/ACT inhaler, Inhale 2 puffs into the lungs 2 (two) times daily., Disp: 1 each, Rfl: 5   furosemide (LASIX) 80 MG tablet, TAKE ONE AND ONE-HALF TABLETS BY MOUTH DAILY. NEW DOSE, Disp: 135 tablet, Rfl: 1   hydroxychloroquine (PLAQUENIL) 200 MG tablet, Take 200 mg by mouth daily., Disp: , Rfl:    latanoprost (XALATAN) 0.005 % ophthalmic solution, Place 1 drop into both eyes at bedtime., Disp: , Rfl:    losartan (COZAAR) 50 MG tablet, Take 1 tablet (50 mg total) by mouth daily., Disp: 90 tablet, Rfl: 3   Omega-3 Fatty Acids (OMEGA 3 500 PO), Take 1 capsule by mouth daily. (Patient not taking: Reported on 08/15/2022), Disp: , Rfl:    pantoprazole (PROTONIX) 40 MG tablet, Take 40 mg by mouth daily., Disp: , Rfl:    potassium chloride SA (K-DUR,KLOR-CON) 20 MEQ tablet, Take 2 tablets (40 mEq total) by mouth 2 (two) times daily. (Patient taking differently: Take 20 mEq by mouth 2 (two) times daily. Take 2 in the AM and 1 in the PM), Disp: , Rfl:    rosuvastatin (CRESTOR) 20 MG tablet, TAKE 1 TABLET BY MOUTH DAILY, Disp: 90 tablet, Rfl: 2   Saccharomyces boulardii (PROBIOTIC) 250 MG CAPS, Take 1 capsule by mouth daily., Disp: , Rfl:    Spacer/Aero-Holding Chambers (AEROCHAMBER MV) inhaler, Use  as instructed, Disp: 1 each, Rfl: 0   Turmeric 500 MG CAPS, Take by mouth daily., Disp: , Rfl:   Current Facility-Administered Medications:    lidocaine-EPINEPHrine (XYLOCAINE W/EPI) 1 %-1:100000 (with pres) injection 10 mL, 10 mL, Infiltration, Once, Croitoru, Mihai, MD   lidocaine-EPINEPHrine (XYLOCAINE W/EPI) 1 %-1:100000 (with pres) injection 15 mL, 15 mL, Infiltration, Once, Croitoru, Mihai, MD  Allergies:  Allergies  Allergen Reactions   Iodinated Contrast Media Other (See Comments)    Kidney failure.    Ciprofloxacin Itching and Rash   E-Mycin [Erythromycin Base] Nausea And Vomiting   Oxycodone Other (See Comments)    Hallucination.    Past Medical History, Surgical history, Social history,  and Family History were reviewed and updated.  Review of Systems: Review of Systems  Constitutional: Negative.   HENT:  Negative.    Eyes:  Positive for eye problems.  Respiratory: Negative.    Cardiovascular: Negative.   Gastrointestinal: Negative.   Endocrine: Negative.   Genitourinary: Negative.    Musculoskeletal:  Positive for back pain.  Neurological: Negative.   Hematological: Negative.   Psychiatric/Behavioral: Negative.      Physical Exam:  height is 5\' 6"  (1.676 m) and weight is 209 lb (94.8 kg). Her oral temperature is 97.9 F (36.6 C). Her blood pressure is 154/52 (abnormal) and her pulse is 66. Her respiration is 18 and oxygen saturation is 100%.   Wt Readings from Last 3 Encounters:  12/13/22 209 lb (94.8 kg)  12/12/22 210 lb 9.6 oz (95.5 kg)  12/05/22 209 lb 9.6 oz (95.1 kg)    Physical Exam Vitals reviewed.  HENT:     Head: Normocephalic and atraumatic.  Eyes:     Pupils: Pupils are equal, round, and reactive to light.     Comments: She has some slight ptosis of the right eye.  Her extraocular muscles are intact.  Cardiovascular:     Rate and Rhythm: Normal rate and regular rhythm.     Heart sounds: Normal heart sounds.  Pulmonary:     Effort: Pulmonary effort is normal.     Breath sounds: Normal breath sounds.  Abdominal:     General: Bowel sounds are normal.     Palpations: Abdomen is soft.  Musculoskeletal:        General: No tenderness or deformity. Normal range of motion.     Cervical back: Normal range of motion.  Lymphadenopathy:     Cervical: No cervical adenopathy.  Skin:    General: Skin is warm and dry.     Findings: No erythema or rash.  Neurological:     Mental Status: She is alert and oriented to person, place, and time.  Psychiatric:        Behavior: Behavior normal.        Thought Content: Thought content normal.        Judgment: Judgment normal.      Lab Results  Component Value Date   WBC 5.1 12/13/2022   HGB 11.6 (L)  12/13/2022   HCT 36.8 12/13/2022   MCV 91.8 12/13/2022   PLT 125 (L) 12/13/2022     Chemistry      Component Value Date/Time   NA 140 12/13/2022 1248   NA 143 05/31/2022 0954   NA 142 05/18/2015 1257   K 4.0 12/13/2022 1248   K 3.6 05/18/2015 1257   CL 105 12/13/2022 1248   CL 104 01/15/2013 0844   CO2 27 12/13/2022 1248   CO2 26 05/18/2015 1257  BUN 12 12/13/2022 1248   BUN 13 05/31/2022 0954   BUN 12.6 05/18/2015 1257   CREATININE 0.83 12/13/2022 1248   CREATININE 0.8 05/18/2015 1257      Component Value Date/Time   CALCIUM 9.4 12/13/2022 1248   CALCIUM 9.1 05/18/2015 1257   ALKPHOS 107 12/13/2022 1248   ALKPHOS 122 05/18/2015 1257   AST 17 12/13/2022 1248   AST 17 05/18/2015 1257   ALT 9 12/13/2022 1248   ALT 13 05/18/2015 1257   BILITOT 0.7 12/13/2022 1248   BILITOT 0.72 05/18/2015 1257      Impression and Plan: Ms. Ozawa is a very nice 72 year old African-American female.  We had seen her in the remote past with a lymphoplasmacytic lymphoma.  She was treated for this back in March 2010.  She has been in remission since.  Again, I cannot find any malignancy that would account for the ocular amyloidosis.  At this point, I think we can just plan to get her back in 6 months.  I think if there is an issue with respect to this amyloid, I do not know if radiation might be a therapy since this is some that is localized and not systemic.     Josph Macho, MD 6/11/20241:16 PM

## 2022-12-14 LAB — BETA 2 MICROGLOBULIN, SERUM: Beta-2 Microglobulin: 2.5 mg/L — ABNORMAL HIGH (ref 0.6–2.4)

## 2022-12-14 LAB — IGG, IGA, IGM
IgA: 47 mg/dL — ABNORMAL LOW (ref 64–422)
IgG (Immunoglobin G), Serum: 959 mg/dL (ref 586–1602)
IgM (Immunoglobulin M), Srm: <5 mg/dL — ABNORMAL LOW (ref 26–217)

## 2022-12-15 ENCOUNTER — Other Ambulatory Visit: Payer: Self-pay | Admitting: Cardiovascular Disease

## 2022-12-15 LAB — KAPPA/LAMBDA LIGHT CHAINS
Kappa free light chain: 12.8 mg/L (ref 3.3–19.4)
Kappa, lambda light chain ratio: 0.93 (ref 0.26–1.65)
Lambda free light chains: 13.7 mg/L (ref 5.7–26.3)

## 2022-12-15 NOTE — Telephone Encounter (Signed)
Rx request sent to pharmacy.  

## 2022-12-19 DIAGNOSIS — M25572 Pain in left ankle and joints of left foot: Secondary | ICD-10-CM | POA: Diagnosis not present

## 2022-12-19 DIAGNOSIS — M25571 Pain in right ankle and joints of right foot: Secondary | ICD-10-CM | POA: Diagnosis not present

## 2022-12-19 DIAGNOSIS — M722 Plantar fascial fibromatosis: Secondary | ICD-10-CM | POA: Diagnosis not present

## 2022-12-19 NOTE — Progress Notes (Signed)
[facility] PREP Weekly Session  Patient Details  Name: [name] MRN: [id number] Date of Birth: 05/15/1951 Age: 72 y.o. PCP: Georgianne Fick, MD  Vitals:   12/19/22 1030  Weight: 209 lb (94.8 kg)     [facility] Weekly seesion - 12/19/22 1500       [facility] "PREP" Location   [facility] "PREP" Location [facility]      Weekly Session   Topic Discussed Finding support    Minutes exercised this week 280 minutes    Classes attended to date 79             Pam Jerral Bonito 12/19/2022, 3:41 PM

## 2022-12-20 ENCOUNTER — Ambulatory Visit (HOSPITAL_BASED_OUTPATIENT_CLINIC_OR_DEPARTMENT_OTHER): Payer: Medicare Other | Admitting: Family

## 2022-12-20 ENCOUNTER — Encounter (HOSPITAL_BASED_OUTPATIENT_CLINIC_OR_DEPARTMENT_OTHER): Payer: Self-pay | Admitting: Family

## 2022-12-20 VITALS — BP 128/80 | HR 82 | Ht 66.0 in | Wt 207.3 lb

## 2022-12-20 DIAGNOSIS — I10 Essential (primary) hypertension: Secondary | ICD-10-CM

## 2022-12-20 DIAGNOSIS — I5032 Chronic diastolic (congestive) heart failure: Secondary | ICD-10-CM

## 2022-12-20 DIAGNOSIS — E782 Mixed hyperlipidemia: Secondary | ICD-10-CM | POA: Diagnosis not present

## 2022-12-20 NOTE — Progress Notes (Signed)
Cardiology Office Note:  .   Date:  12/20/2022  ID:  [name], DOB [birth year], MRN [id number] PCP: Georgianne Fick, MD  [facility] Providers Cardiologist:  Chilton Si, MD    History of Present Illness: .   [name] is a 72 y.o. female  hx of prior stroke, rheumatoid arthritis, lupus, interstitial lung disease, chronic diastolic heart failure, hyperlipidemia, hypertension, prior lymphoplasmacytoid lymphoma, asthma last seen 07/26/2022  Prior echocardiogram 01/2018 LVEF 60 to 65% with grade 2 diastolic dysfunction.  Seen in ED 10/22 with chest pain.  EKG revealed diffuse T wave inversion.  Cardiac enzymes negative X.3.  Outpatient Lexiscan Myoview 05/2018 LVEF 68%, no ischemia.  Episode of near-syncope 03/2019 metoprolol was discontinued.  Implantable loop recorder placed 12/2019 by Dr. Royann Shivers.  Carotid Dopplers revealed mild stenosis.  She did fall 08/2020 with no associated events on ILR.  Recurrent fall 02/2021 walking into her bathroom and turning her head.  No preceding lightheadedness, dizziness.  She had right hip replacement 03/2021 with some postoperative lower extremity edema.  Her loop recorder was extracted 12/2021.  Evaluated by Dr. Duke Salvia 05/31/2022 noting dyspnea, fatigue with minimal exertion. Subsequent echocardiogram revealed normal LVEF, mild MR, aortic valve sclerosis without stenosis, elevated LVEDP which was similar to previous.  Myoview 06/06/2022 low risk with no evidence of ischemia.  She saw Dr. 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  Renal duplex 07/05/2022 normal with no changes associated with amyloid.  Bone marrow biopsy performed 07/12/2022. No recurrent cancer found.  Last seen 07/26/2022.  Preop clearance provided for excision of conjunctival lesion.  She was referred to prep exercise program.  She presents today for follow-up with her husband. Participating in [employer]. Has lost 3  lbs since last office visit.  BP at home 120s/80s. Notes exertional dyspnea has improved since starting PREP. Last week while cooking in the kitchen had an episode of lightheadedness and had to lay down early. Notes she had not had much to eat that day.  Discussed eating, drinking regularly.  No chest pain, edema, orthopnea, PND.  ROS: Please see the history of present illness.    All other systems reviewed and are negative.   Studies Reviewed: .        Cardiac Studies & Procedures     STRESS TESTS  MYOCARDIAL PERFUSION IMAGING 06/06/2022  Narrative   The study is normal. The study is low risk.   No ST deviation was noted. The ECG was not diagnostic due to pharmacologic protocol.   LV perfusion is normal. There is no evidence of ischemia. There is no evidence of infarction.   Left ventricular function is normal. Nuclear stress EF: 64 %. The left ventricular ejection fraction is normal (55-65%). End diastolic cavity size is normal. End systolic cavity size is normal.   Prior study available for comparison from 05/08/2018. No changes compared to prior study.   ECHOCARDIOGRAM  ECHOCARDIOGRAM COMPLETE 06/02/2022  Narrative ECHOCARDIOGRAM REPORT    Patient Name:   [name] Date of Exam: 06/02/2022 Medical Rec #:  [id number]       Height:       66.0 in Accession #:    [id number]      Weight:       211.2 lb Date of Birth:  1950-12-21       BSA:          2.047 m Patient Age:    73 years  BP:           110/78 mmHg Patient Gender: F               HR:           74 bpm. Exam Location:  Outpatient  Procedure: 2D Echo, 3D Echo, Color Doppler, Cardiac Doppler and Strain Analysis  Indications:    Dyspnea  History:        Patient has prior history of Echocardiogram examinations, most recent 08/21/2019. Stroke, Signs/Symptoms:Syncope and Chest Pain; Risk Factors:Hypertension, Former Smoker and Dyslipidemia. Carotid Stneosis.  Sonographer:    Jeryl Columbia RDCS Referring Phys:  [id number] TIFFANY   IMPRESSIONS   1. Left ventricular ejection fraction, by estimation, is 65 to 70%. The left ventricle has normal function. The left ventricle has no regional wall motion abnormalities. Left ventricular diastolic parameters are indeterminate. Elevated left ventricular end-diastolic pressure. The average left ventricular global longitudinal strain is -16.6 %. The global longitudinal strain is normal. 2. Right ventricular systolic function is normal. The right ventricular size is normal. There is normal pulmonary artery systolic pressure. 3. The mitral valve is normal in structure. Mild mitral valve regurgitation. No evidence of mitral stenosis. 4. The aortic valve is tricuspid. There is mild calcification of the aortic valve. Aortic valve regurgitation is not visualized. No aortic stenosis is present. 5. The inferior vena cava is normal in size with greater than 50% respiratory variability, suggesting right atrial pressure of 3 mmHg.  Comparison(s): No significant change from prior study.  FINDINGS Left Ventricle: Left ventricular ejection fraction, by estimation, is 65 to 70%. The left ventricle has normal function. The left ventricle has no regional wall motion abnormalities. The average left ventricular global longitudinal strain is -16.6 %. The global longitudinal strain is normal. The left ventricular internal cavity size was normal in size. There is borderline left ventricular hypertrophy. Left ventricular diastolic parameters are indeterminate. Elevated left ventricular end-diastolic pressure.  Right Ventricle: The right ventricular size is normal. No increase in right ventricular wall thickness. Right ventricular systolic function is normal. There is normal pulmonary artery systolic pressure. The tricuspid regurgitant velocity is 1.99 m/s, and with an assumed right atrial pressure of 3 mmHg, the estimated right ventricular systolic pressure is 18.8 mmHg.  Left  Atrium: Left atrial size was normal in size.  Right Atrium: Right atrial size was normal in size.  Pericardium: Trivial pericardial effusion is present.  Mitral Valve: The mitral valve is normal in structure. Mild mitral valve regurgitation. No evidence of mitral valve stenosis.  Tricuspid Valve: The tricuspid valve is normal in structure. Tricuspid valve regurgitation is trivial. No evidence of tricuspid stenosis.  Aortic Valve: The aortic valve is tricuspid. There is mild calcification of the aortic valve. Aortic valve regurgitation is not visualized. No aortic stenosis is present.  Pulmonic Valve: The pulmonic valve was not well visualized. Pulmonic valve regurgitation is not visualized. No evidence of pulmonic stenosis.  Aorta: The aortic root and ascending aorta are structurally normal, with no evidence of dilitation.  Venous: The inferior vena cava is normal in size with greater than 50% respiratory variability, suggesting right atrial pressure of 3 mmHg.  IAS/Shunts: The atrial septum is grossly normal.   LEFT VENTRICLE PLAX 2D LVIDd:         3.56 cm   Diastology LVIDs:         2.36 cm   LV e' medial:    6.31 cm/s LV PW:  1.21 cm   LV E/e' medial:  19.5 LV IVS:        0.97 cm   LV e' lateral:   6.85 cm/s LVOT diam:     1.80 cm   LV E/e' lateral: 18.0 LV SV:         58 LV SV Index:   28        2D Longitudinal Strain LVOT Area:     2.54 cm  2D Strain GLS (A2C):   -17.5 % 2D Strain GLS (A3C):   -14.5 % 2D Strain GLS (A4C):   -17.7 % 2D Strain GLS Avg:     -16.6 %  3D Volume EF: 3D EF:        67 % LV EDV:       103 ml LV ESV:       34 ml LV SV:        70 ml  RIGHT VENTRICLE RV Basal diam:  3.45 cm RV Mid diam:    2.31 cm RV S prime:     13.50 cm/s TAPSE (M-mode): 2.2 cm  LEFT ATRIUM             Index        RIGHT ATRIUM           Index LA diam:        3.20 cm 1.56 cm/m   RA Area:     12.80 cm LA Vol (A2C):   49.8 ml 24.33 ml/m  RA Volume:   26.70 ml   13.05 ml/m LA Vol (A4C):   61.3 ml 29.95 ml/m LA Biplane Vol: 55.8 ml 27.26 ml/m AORTIC VALVE LVOT Vmax:   103.00 cm/s LVOT Vmean:  69.200 cm/s LVOT VTI:    0.228 m  AORTA Ao Root diam: 2.40 cm Ao Asc diam:  3.00 cm  MITRAL VALVE                TRICUSPID VALVE MV Area (PHT): 4.31 cm     TR Peak grad:   15.8 mmHg MV Decel Time: 176 msec     TR Vmax:        199.00 cm/s MR Peak grad: 50.4 mmHg MR Vmax:      355.00 cm/s   SHUNTS MV E velocity: 123.00 cm/s  Systemic VTI:  0.23 m MV A velocity: 74.40 cm/s   Systemic Diam: 1.80 cm MV E/A ratio:  1.65  Jodelle Red MD Electronically signed by Jodelle Red MD Signature Date/Time: 06/02/2022/9:59:04 AM    Final    MONITORS  CARDIAC EVENT MONITOR 10/23/2019  Narrative 30 Day Event Monitor  Quality: Fair.  Baseline artifact. Predominant rhythm: sinus rhythm Average heart rate: 61 bpm Max heart rate: 106 bpm Min heart rate: 58 bpm  No arrhythmias  Tiffany C. Duke Salvia, MD, [facility] 10/23/2019 8:44 AM           Risk Assessment/Calculations:             Physical Exam:   VS:  BP 128/80   Pulse 82   Ht 5\' 6"  (1.676 m)   Wt 207 lb 4.8 oz (94 kg)   BMI 33.46 kg/m    Wt Readings from Last 3 Encounters:  12/20/22 207 lb 4.8 oz (94 kg)  12/19/22 209 lb (94.8 kg)  12/13/22 209 lb (94.8 kg)    GEN: Well nourished, well developed in no acute distress NECK: No JVD; No carotid bruits CARDIAC: RRR, no murmurs, rubs, gallops RESPIRATORY:  Clear  to auscultation without rales, wheezing or rhonchi  ABDOMEN: Soft, non-tender, non-distended EXTREMITIES:  No edema; No deformity   ASSESSMENT AND PLAN: .     HTN-initial reading elevated but repeat 128/80.  BP at home routinely 120s over 80s.  Continue  current antihypertensive regimen Lasix 120 mg daily, losartan 50 mg daily.  Exertional dyspnea- Echo 06/02/22 normal LVEF, elevated LVEDP, mild MR. Myoview 06/06/22 low risk.  Exertional dyspnea improving as she is  participating in PREP exercise program.  Chronic diastolic heart failure- Euvolemic and well compensated on exam. Continue furosemide 120 mg daily. Low sodium diet, fluid restriction <2L, and daily weights encouraged. Educated to contact our office for weight gain of 2 lbs overnight or 5 lbs in one week.   Hyperlipidemia-05/2022 LDL 65.  Continue atorvastatin, aspirin due to history of CVA.       Dispo: 6-8  mos with Dr. Duke Salvia or Alver Sorrow, NP   Signed, Alver Sorrow, NP

## 2022-12-20 NOTE — Patient Instructions (Signed)
Medication Instructions:  Continue your current medications *If you need a refill on your cardiac medications before your next appointment, please call your pharmacy*  Follow-Up: At [facility], you and your health needs are our priority.  As part of our continuing mission to provide you with exceptional heart care, we have created designated Provider Care Teams.  These Care Teams include your primary Cardiologist (physician) and Advanced Practice Providers (APPs -  Physician Assistants and Nurse Practitioners) who all work together to provide you with the care you need, when you need it.  We recommend signing up for the patient portal called "MyChart".  Sign up information is provided on this After Visit Summary.  MyChart is used to connect with patients for Virtual Visits (Telemedicine).  Patients are able to view lab/test results, encounter notes, upcoming appointments, etc.  Non-urgent messages can be sent to your provider as well.   To learn more about what you can do with MyChart, go to [email or web address].    Your next appointment:   6-8 month(s)  Provider:   Chilton Si, MD or Gillian Shields, NP    Other Instructions  Heart Healthy Diet Recommendations: A low-salt diet is recommended. Meats should be grilled, baked, or boiled. Avoid fried foods. Focus on lean protein sources like fish or chicken with vegetables and fruits. The American Heart Association is a Chief Technology Officer!  American Heart Association Diet and Lifeystyle Recommendations   Exercise recommendations: The American Heart Association recommends 150 minutes of moderate intensity exercise weekly. Try 30 minutes of moderate intensity exercise 4-5 times per week. This could include walking, jogging, or swimming.

## 2022-12-21 LAB — IMMUNOFIXATION REFLEX, SERUM
IgA: 56 mg/dL — ABNORMAL LOW (ref 64–422)
IgG (Immunoglobin G), Serum: 1098 mg/dL (ref 586–1602)
IgM (Immunoglobulin M), Srm: <5 mg/dL — ABNORMAL LOW (ref 26–217)

## 2022-12-21 LAB — PROTEIN ELECTROPHORESIS, SERUM, WITH REFLEX
A/G Ratio: 1.1 (ref 0.7–1.7)
Albumin ELP: 3.4 g/dL (ref 2.9–4.4)
Alpha-1-Globulin: 0.3 g/dL (ref 0.0–0.4)
Alpha-2-Globulin: 0.9 g/dL (ref 0.4–1.0)
Beta Globulin: 0.9 g/dL (ref 0.7–1.3)
Gamma Globulin: 0.9 g/dL (ref 0.4–1.8)
Globulin, Total: 3.1 g/dL (ref 2.2–3.9)
M-Spike, %: 0.3 g/dL — ABNORMAL HIGH
SPEP Interpretation: 0
Total Protein ELP: 6.5 g/dL (ref 6.0–8.5)

## 2022-12-22 DIAGNOSIS — Z79899 Other long term (current) drug therapy: Secondary | ICD-10-CM | POA: Diagnosis not present

## 2022-12-22 DIAGNOSIS — M359 Systemic involvement of connective tissue, unspecified: Secondary | ICD-10-CM | POA: Diagnosis not present

## 2022-12-22 DIAGNOSIS — M79643 Pain in unspecified hand: Secondary | ICD-10-CM | POA: Diagnosis not present

## 2022-12-22 DIAGNOSIS — M3501 Sicca syndrome with keratoconjunctivitis: Secondary | ICD-10-CM | POA: Diagnosis not present

## 2022-12-27 NOTE — Progress Notes (Signed)
[facility] PREP Weekly Session  Patient Details  Name: [name] MRN: [id number] Date of Birth: 11-Dec-1950 Age: 72 y.o. PCP: Georgianne Fick, MD  There were no vitals filed for this visit.   [facility] Weekly seesion - 12/27/22 1000       [facility] "PREP" Location   [facility] "PREP" Location [facility]      Weekly Session   Topic Discussed Calorie breakdown    Minutes exercised this week 310 minutes    Classes attended to date 51             Pam Jerral Bonito 12/27/2022, 10:33 AM

## 2023-01-02 DIAGNOSIS — M25572 Pain in left ankle and joints of left foot: Secondary | ICD-10-CM | POA: Diagnosis not present

## 2023-01-02 DIAGNOSIS — M25571 Pain in right ankle and joints of right foot: Secondary | ICD-10-CM | POA: Diagnosis not present

## 2023-01-03 ENCOUNTER — Ambulatory Visit
Admission: RE | Admit: 2023-01-03 | Discharge: 2023-01-03 | Disposition: A | Discharge status: Home / Self Care | Payer: Medicare Other | Source: Ambulatory Visit | Attending: Internal Medicine | Admitting: Internal Medicine

## 2023-01-03 DIAGNOSIS — M359 Systemic involvement of connective tissue, unspecified: Secondary | ICD-10-CM | POA: Diagnosis not present

## 2023-01-03 DIAGNOSIS — M3501 Sicca syndrome with keratoconjunctivitis: Secondary | ICD-10-CM | POA: Diagnosis not present

## 2023-01-03 DIAGNOSIS — M79643 Pain in unspecified hand: Secondary | ICD-10-CM | POA: Diagnosis not present

## 2023-01-03 DIAGNOSIS — Z1231 Encounter for screening mammogram for malignant neoplasm of breast: Secondary | ICD-10-CM

## 2023-01-03 DIAGNOSIS — Z79899 Other long term (current) drug therapy: Secondary | ICD-10-CM | POA: Diagnosis not present

## 2023-01-03 NOTE — Progress Notes (Signed)
[facility] PREP Weekly Session  Patient Details  Name: [name] MRN: [id number] Date of Birth: [birth year] Age: 72 y.o. PCP: Georgianne Fick, MD  Vitals:   01/02/23 1300  Weight: 207 lb (93.9 kg)     [facility] Weekly seesion - 01/03/23 1000       [facility] "PREP" Location   [facility] "PREP" Location [facility]      Weekly Session   Topic Discussed Hitting roadblocks    Minutes exercised this week 180 minutes    Classes attended to date 60             Pam Jerral Bonito 01/03/2023, 10:02 AM

## 2023-01-11 ENCOUNTER — Telehealth: Payer: Self-pay | Admitting: Family

## 2023-01-11 NOTE — Telephone Encounter (Signed)
Pt states [name], NP suggested the PREP program and she lost weight but has gained it back. She states she is till not eating any salty foods, but her legs and ankles are swelling. She denies any SOB currently, but does experience it when she walks. She states she is is still taking furosemide as written. The only recent change to her daily regimen is her rheumatologist put her on this med Leflunomide 20mg . Please advise.

## 2023-01-11 NOTE — Progress Notes (Unsigned)
[facility] PREP Evaluation  Patient Details  Name: [name] MRN: [id number] Date of Birth: 19-Aug-1950 Age: 72 y.o. PCP: Georgianne Fick, MD  Vitals:   01/11/23 1000  BP: 126/70  Pulse: 88  SpO2: 98%  Weight: 216 lb (98 kg)     [facility] Eval - 01/11/23 1200       [facility] "PREP" Location   [facility] "PREP" Location [facility]      Referral    Referring Provider Walker    Program Start Date 10/17/22    Program End Date 01/11/23      Measurement   Waist Circumference 47 inches    Waist Circumference End Program 45 inches    Hip Circumference 46 inches    Hip Circumference End Program 47.5 inches    Body fat 45.1 percent      Information for Trainer   Goals Plans on exercising 2x week      Mobility and Daily Activities   I find it easy to walk up or down two or more flights of stairs. 1    I have no trouble taking out the trash. 4    I do housework such as vacuuming and dusting on my own without difficulty. 4    I can easily lift a gallon of milk (8lbs). 4    I can easily walk a mile. 2    I have no trouble reaching into high cupboards or reaching down to pick up something from the floor. 2    I do not have trouble doing out-door work such as Loss adjuster, chartered, raking leaves, or gardening. 1      Mobility and Daily Activities   I feel younger than my age. 3    I feel independent. 4    I feel energetic. 3    I live an active life.  3    I feel strong. 3    I feel healthy. 4    I feel active as other people my age. 4      How fit and strong are you.   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 2022 - right   MASS EXCISION     back   rortator     rotator cuff   TOTAL SHOULDER ARTHROPLASTY Left 08/16/2018   Procedure: LEFT SHOULDER REVERSE TOTAL SHOULDER ARTHROPLASTY AND BICEPS TENOTOMY;  Surgeon: Jones Broom, MD;  Location: [facility] OR;  Service: Orthopedics;  Laterality: Left;   Social History   Tobacco Use  Smoking Status Former   Packs/day: 1.00   Years: 10.00   Additional pack years: 0.00   Total pack  years: 10.00   Types: Cigarettes   Quit date: 06/17/1994   Years since quitting: 28.5   Passive exposure: Past  Smokeless Tobacco Never  Attended 21 sessions/11 educational Fit testing: Cardio march: 225 to 220 Sit to stand (hands down): 7 to 6 Bicep curl: 21 to 16 Continued exercises for balance improvement encouraged daily. Pt may request a PT consult.  Recommended 150 min of cardio, strength train atleast 2 x wk, keep sodium and added sugar low  C/O LE ext edema bilaterally +1. Weight is up. Has not contacted MD. No SOB, able to exercise without issue. Wearing ankle braces-encouraged TEDS instead.  Is taking diuretic and avoiding salt.  Started new Rheum med last Tuesday.  Encouraged to reach out to Provider so they can adjust meds as needed.   Bonnye Fava 01/11/2023, 12:31 PM

## 2023-01-11 NOTE — Telephone Encounter (Signed)
Returned call to patient who reports increased swelling in legs since Saturday July 6th.  Patient denies shortness of breath, or swelling other than in legs ankles and feet.  Pt states she weighed 204 on July 6th and today she is 216 lbs. Pt reports a recent rheumatoid arthritis flare in her feet. Rheumatologist added Leflunomide to her treatment regimen.  Per protocol and conversation with coworker pt was instructed to take 40mg  extra lasix today.  Also encouraged patient to elevate her legs as much as possible, watch for edema in her face and other areas.  If she has increasing edema and/or shortness of breath she should go to the ED. Pt instructed to call the office tomorrow to let us know how she is doing after the extra lasix. Pt verbalized understanding of instructions as provided. Jim Like MHA RN CCM.

## 2023-01-12 ENCOUNTER — Telehealth: Payer: Self-pay | Admitting: Cardiovascular Disease

## 2023-01-12 DIAGNOSIS — M7989 Other specified soft tissue disorders: Secondary | ICD-10-CM

## 2023-01-12 DIAGNOSIS — R635 Abnormal weight gain: Secondary | ICD-10-CM

## 2023-01-12 DIAGNOSIS — Z5181 Encounter for therapeutic drug level monitoring: Secondary | ICD-10-CM

## 2023-01-12 NOTE — Telephone Encounter (Signed)
See updated phone note 01/13/23.   Alver Sorrow, NP

## 2023-01-12 NOTE — Telephone Encounter (Signed)
Discussed with Ronn Melena NP, ok to take an extra 1/2 Lasix today and get BNP/BMET tomorrow or Monday Advised patient, verbalized understanding  Confirmed patient not having any shortness of breath

## 2023-01-12 NOTE — Telephone Encounter (Signed)
Pt calling back regarding yesterday;s phone call stating that she is now down 7 lbs per taking another 1/2 of furosemide. Left foot, ankle, and leg are still a little swollen and pt would like to know what she can do for this.  Please advise.

## 2023-01-16 DIAGNOSIS — R635 Abnormal weight gain: Secondary | ICD-10-CM | POA: Diagnosis not present

## 2023-01-16 DIAGNOSIS — Z5181 Encounter for therapeutic drug level monitoring: Secondary | ICD-10-CM | POA: Diagnosis not present

## 2023-01-16 DIAGNOSIS — M7989 Other specified soft tissue disorders: Secondary | ICD-10-CM | POA: Diagnosis not present

## 2023-01-17 LAB — BRAIN NATRIURETIC PEPTIDE: BNP: 48 pg/mL (ref 0.0–100.0)

## 2023-01-17 LAB — BASIC METABOLIC PANEL
BUN/Creatinine Ratio: 13 (ref 12–28)
BUN: 12 mg/dL (ref 8–27)
CO2: 21 mmol/L (ref 20–29)
Calcium: 8.7 mg/dL (ref 8.7–10.3)
Chloride: 101 mmol/L (ref 96–106)
Creatinine, Ser: 0.94 mg/dL (ref 0.57–1.00)
Glucose: 101 mg/dL — ABNORMAL HIGH (ref 70–99)
Potassium: 3.5 mmol/L (ref 3.5–5.2)
Sodium: 140 mmol/L (ref 134–144)
eGFR: 64 mL/min/{1.73_m2} (ref >59)

## 2023-01-23 ENCOUNTER — Other Ambulatory Visit: Payer: Self-pay | Admitting: Cardiovascular Disease

## 2023-01-23 NOTE — Telephone Encounter (Signed)
Rx request sent to pharmacy.  

## 2023-01-30 ENCOUNTER — Telehealth: Payer: Self-pay | Admitting: Cardiovascular Disease

## 2023-01-30 NOTE — Telephone Encounter (Signed)
Spoke with patient, she will be here tomorrow at appointment  Advised to take and extra 1/2 Lasix today, verbalized understanding

## 2023-01-30 NOTE — Telephone Encounter (Signed)
She has had recurrent difficulties holding onto fluid recently. Please inquire how she has been taking Lasix most recently - we can readdress dosing based on how she is currently taking it.   To prevent her from holding onto fluid, recommend addition of SLGT2i. Start Farxiga 10mg  daily with BMP/BNP in 1 week.   Important to follow low salt diet, restrict to <2L fluid intake (64 oz) per day.   Erica Sorrow, NP

## 2023-01-30 NOTE — Telephone Encounter (Signed)
If she is available for appointment tomorrow, may proceed as scheduled. She may take an extra half tablet of Lasix at lunchtime.   If not, increase Lasix to 2 tablets daily x 2 days then return to 1.5 tablets daily. Add Farxiga 10mg  daily. Follow up visit in 2 weeks and can collect BMP at that time.   Alver Sorrow, NP

## 2023-01-30 NOTE — Telephone Encounter (Signed)
Pt c/o swelling/edema: STAT if pt has developed SOB within 24 hours  If swelling, where is the swelling located? Feet and left leg  How much weight have you gained and in what time span? 7 lbs within 4 days  Have you gained 2 pounds in a day or 5 pounds in a week? Yes  Do you have a log of your daily weights (if so, list)? No  Are you currently taking a fluid pill? Yes  Are you currently SOB? No  Have you traveled recently in a car or plane for an extended period of time? No

## 2023-01-30 NOTE — Telephone Encounter (Signed)
Spoke with patient, she is currently taking Lasix 80 mg 1 and 1/2 tablets daily  Having some shortness of breath with exertion Swelling in ankles, feet, and left leg Happened couple weeks ago, diuretic increased for couple of days and labs obtained. BNP 48  Will forward to [facility] NP for review

## 2023-01-31 ENCOUNTER — Ambulatory Visit (HOSPITAL_BASED_OUTPATIENT_CLINIC_OR_DEPARTMENT_OTHER): Payer: Medicare Other | Admitting: Family

## 2023-01-31 ENCOUNTER — Encounter (HOSPITAL_BASED_OUTPATIENT_CLINIC_OR_DEPARTMENT_OTHER): Payer: Self-pay | Admitting: Family

## 2023-01-31 ENCOUNTER — Telehealth (HOSPITAL_BASED_OUTPATIENT_CLINIC_OR_DEPARTMENT_OTHER): Payer: Self-pay

## 2023-01-31 VITALS — BP 130/66 | HR 94 | Ht 66.0 in | Wt 214.0 lb

## 2023-01-31 DIAGNOSIS — E782 Mixed hyperlipidemia: Secondary | ICD-10-CM | POA: Diagnosis not present

## 2023-01-31 DIAGNOSIS — M7989 Other specified soft tissue disorders: Secondary | ICD-10-CM

## 2023-01-31 DIAGNOSIS — I5032 Chronic diastolic (congestive) heart failure: Secondary | ICD-10-CM

## 2023-01-31 DIAGNOSIS — T17308A Unspecified foreign body in larynx causing other injury, initial encounter: Secondary | ICD-10-CM | POA: Insufficient documentation

## 2023-01-31 DIAGNOSIS — T17308D Unspecified foreign body in larynx causing other injury, subsequent encounter: Secondary | ICD-10-CM | POA: Diagnosis not present

## 2023-01-31 DIAGNOSIS — I10 Essential (primary) hypertension: Secondary | ICD-10-CM

## 2023-01-31 DIAGNOSIS — R1319 Other dysphagia: Secondary | ICD-10-CM | POA: Diagnosis not present

## 2023-01-31 MED ORDER — DAPAGLIFLOZIN PROPANEDIOL 10 MG PO TABS: 10.0000 mg | ORAL_TABLET | Freq: Every day | ORAL | Status: DC

## 2023-01-31 MED ORDER — DAPAGLIFLOZIN PROPANEDIOL 10 MG PO TABS
10.0000 mg | ORAL_TABLET | Freq: Every day | ORAL | 2 refills | Status: DC
Start: 2023-01-31 — End: 2023-03-03

## 2023-01-31 NOTE — Progress Notes (Signed)
Cardiology Office Note:  .   Date:  02/05/2023  ID:  [name], DOB 20-Feb-1951, MRN [id number] PCP: Georgianne Fick, MD  [facility] Providers Cardiologist:  Chilton Si, MD    History of Present Illness: .   [name] is a 72 y.o. female  hx of prior stroke, rheumatoid arthritis, lupus, interstitial lung disease, chronic diastolic heart failure, hyperlipidemia, hypertension, prior lymphoplasmacytoid lymphoma, asthma last seen 07/26/2022  Prior echocardiogram 01/2018 LVEF 60 to 65% with grade 2 diastolic dysfunction.  Seen in ED 10/22 with chest pain.  EKG revealed diffuse T wave inversion.  Cardiac enzymes negative X.3.  Outpatient Lexiscan Myoview 05/2018 LVEF 68%, no ischemia.  Episode of near-syncope 03/2019 metoprolol was discontinued.  Implantable loop recorder placed 12/2019 by Dr. Royann Shivers.  Carotid Dopplers revealed mild stenosis.  She did fall 08/2020 with no associated events on ILR.  Recurrent fall 02/2021 walking into her bathroom and turning her head.  No preceding lightheadedness, dizziness.  She had right hip replacement 03/2021 with some postoperative lower extremity edema.  Her loop recorder was extracted 12/2021.  Evaluated by Dr. Duke Salvia 05/31/2022 noting dyspnea, fatigue with minimal exertion. Subsequent echocardiogram revealed normal LVEF, mild MR, aortic valve sclerosis without stenosis, elevated LVEDP which was similar to previous.  Myoview 06/06/2022 low risk with no evidence of ischemia.  She saw Dr. 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  Renal duplex 07/05/2022 normal with no changes associated with amyloid.  Bone marrow biopsy performed 07/12/2022. No recurrent cancer found.  Seen 07/26/2022 given preop clearance provided for excision of conjunctival lesion.  Referred to and completed PREP exercise program. Last seen 12/20/22 doing well with no changes.   She presents today for follow-up  after MyChart messages with persistent edema despite increased dose of Lasix. Notes dyspnea with activities such as showering which is new for her. No chest pain, pressure, tightness. No palpitations, orthopnea, PND.   ROS: Please see the history of present illness.    All other systems reviewed and are negative.   Studies Reviewed: Marland Kitchen   EKG Interpretation Date/Time:  Tuesday January 31 2023 15:01:58 EDT Ventricular Rate:  94 PR Interval:  176 QRS Duration:  68 QT Interval:  348 QTC Calculation: 435 R Axis:   54  Text Interpretation: Normal sinus rhythm Inferolateral TWI Confirmed by Gillian Shields ([id number]) on 02/05/2023 6:40:12 PM    Cardiac Studies & Procedures     STRESS TESTS  MYOCARDIAL PERFUSION IMAGING 06/06/2022  Narrative   The study is normal. The study is low risk.   No ST deviation was noted. The ECG was not diagnostic due to pharmacologic protocol.   LV perfusion is normal. There is no evidence of ischemia. There is no evidence of infarction.   Left ventricular function is normal. Nuclear stress EF: 64 %. The left ventricular ejection fraction is normal (55-65%). End diastolic cavity size is normal. End systolic cavity size is normal.   Prior study available for comparison from 05/08/2018. No changes compared to prior study.   ECHOCARDIOGRAM  ECHOCARDIOGRAM COMPLETE 06/02/2022  Narrative ECHOCARDIOGRAM REPORT    Patient Name:   [name] Date of Exam: 06/02/2022 Medical Rec #:  [id number]       Height:       66.0 in Accession #:    [id number]      Weight:       211.2 lb Date of Birth:  1950-07-23  BSA:          2.047 m Patient Age:    71 years        BP:           110/78 mmHg Patient Gender: F               HR:           74 bpm. Exam Location:  Outpatient  Procedure: 2D Echo, 3D Echo, Color Doppler, Cardiac Doppler and Strain Analysis  Indications:    Dyspnea  History:        Patient has prior history of Echocardiogram examinations, most recent  08/21/2019. Stroke, Signs/Symptoms:Syncope and Chest Pain; Risk Factors:Hypertension, Former Smoker and Dyslipidemia. Carotid Stneosis.  Sonographer:    Jeryl Columbia RDCS Referring Phys: [id number] TIFFANY [place]  IMPRESSIONS   1. Left ventricular ejection fraction, by estimation, is 65 to 70%. The left ventricle has normal function. The left ventricle has no regional wall motion abnormalities. Left ventricular diastolic parameters are indeterminate. Elevated left ventricular end-diastolic pressure. The average left ventricular global longitudinal strain is -16.6 %. The global longitudinal strain is normal. 2. Right ventricular systolic function is normal. The right ventricular size is normal. There is normal pulmonary artery systolic pressure. 3. The mitral valve is normal in structure. Mild mitral valve regurgitation. No evidence of mitral stenosis. 4. The aortic valve is tricuspid. There is mild calcification of the aortic valve. Aortic valve regurgitation is not visualized. No aortic stenosis is present. 5. The inferior vena cava is normal in size with greater than 50% respiratory variability, suggesting right atrial pressure of 3 mmHg.  Comparison(s): No significant change from prior study.  FINDINGS Left Ventricle: Left ventricular ejection fraction, by estimation, is 65 to 70%. The left ventricle has normal function. The left ventricle has no regional wall motion abnormalities. The average left ventricular global longitudinal strain is -16.6 %. The global longitudinal strain is normal. The left ventricular internal cavity size was normal in size. There is borderline left ventricular hypertrophy. Left ventricular diastolic parameters are indeterminate. Elevated left ventricular end-diastolic pressure.  Right Ventricle: The right ventricular size is normal. No increase in right ventricular wall thickness. Right ventricular systolic function is normal. There is normal pulmonary artery  systolic pressure. The tricuspid regurgitant velocity is 1.99 m/s, and with an assumed right atrial pressure of 3 mmHg, the estimated right ventricular systolic pressure is 18.8 mmHg.  Left Atrium: Left atrial size was normal in size.  Right Atrium: Right atrial size was normal in size.  Pericardium: Trivial pericardial effusion is present.  Mitral Valve: The mitral valve is normal in structure. Mild mitral valve regurgitation. No evidence of mitral valve stenosis.  Tricuspid Valve: The tricuspid valve is normal in structure. Tricuspid valve regurgitation is trivial. No evidence of tricuspid stenosis.  Aortic Valve: The aortic valve is tricuspid. There is mild calcification of the aortic valve. Aortic valve regurgitation is not visualized. No aortic stenosis is present.  Pulmonic Valve: The pulmonic valve was not well visualized. Pulmonic valve regurgitation is not visualized. No evidence of pulmonic stenosis.  Aorta: The aortic root and ascending aorta are structurally normal, with no evidence of dilitation.  Venous: The inferior vena cava is normal in size with greater than 50% respiratory variability, suggesting right atrial pressure of 3 mmHg.  IAS/Shunts: The atrial septum is grossly normal.   LEFT VENTRICLE PLAX 2D LVIDd:         3.56 cm   Diastology LVIDs:  2.36 cm   LV e' medial:    6.31 cm/s LV PW:         1.21 cm   LV E/e' medial:  19.5 LV IVS:        0.97 cm   LV e' lateral:   6.85 cm/s LVOT diam:     1.80 cm   LV E/e' lateral: 18.0 LV SV:         58 LV SV Index:   28        2D Longitudinal Strain LVOT Area:     2.54 cm  2D Strain GLS (A2C):   -17.5 % 2D Strain GLS (A3C):   -14.5 % 2D Strain GLS (A4C):   -17.7 % 2D Strain GLS Avg:     -16.6 %  3D Volume EF: 3D EF:        67 % LV EDV:       103 ml LV ESV:       34 ml LV SV:        70 ml  RIGHT VENTRICLE RV Basal diam:  3.45 cm RV Mid diam:    2.31 cm RV S prime:     13.50 cm/s TAPSE (M-mode): 2.2  cm  LEFT ATRIUM             Index        RIGHT ATRIUM           Index LA diam:        3.20 cm 1.56 cm/m   RA Area:     12.80 cm LA Vol (A2C):   49.8 ml 24.33 ml/m  RA Volume:   26.70 ml  13.05 ml/m LA Vol (A4C):   61.3 ml 29.95 ml/m LA Biplane Vol: 55.8 ml 27.26 ml/m AORTIC VALVE LVOT Vmax:   103.00 cm/s LVOT Vmean:  69.200 cm/s LVOT VTI:    0.228 m  AORTA Ao Root diam: 2.40 cm Ao Asc diam:  3.00 cm  MITRAL VALVE                TRICUSPID VALVE MV Area (PHT): 4.31 cm     TR Peak grad:   15.8 mmHg MV Decel Time: 176 msec     TR Vmax:        199.00 cm/s MR Peak grad: 50.4 mmHg MR Vmax:      355.00 cm/s   SHUNTS MV E velocity: 123.00 cm/s  Systemic VTI:  0.23 m MV A velocity: 74.40 cm/s   Systemic Diam: 1.80 cm MV E/A ratio:  1.65  Jodelle Red MD Electronically signed by Jodelle Red MD Signature Date/Time: 06/02/2022/9:59:04 AM    Final    MONITORS  CARDIAC EVENT MONITOR 10/14/2019  Narrative 30 Day Event Monitor  Quality: Fair.  Baseline artifact. Predominant rhythm: sinus rhythm Average heart rate: 61 bpm Max heart rate: 106 bpm Min heart rate: 58 bpm  No arrhythmias  Tiffany C. Duke Salvia, MD, [facility] 10/23/2019 8:44 AM           Risk Assessment/Calculations:            Physical Exam:   VS:  BP 130/66   Pulse 94   Ht 5\' 6"  (1.676 m)   Wt 214 lb (97.1 kg)   BMI 34.54 kg/m    Wt Readings from Last 3 Encounters:  01/31/23 214 lb (97.1 kg)  01/11/23 216 lb (98 kg)  01/02/23 207 lb (93.9 kg)    GEN: Well nourished, well developed in no  acute distress NECK: No JVD; No carotid bruits CARDIAC: RRR, no murmurs, rubs, gallops RESPIRATORY:  Clear to auscultation without rales, wheezing or rhonchi  ABDOMEN: Soft, non-tender, non-distended EXTREMITIES:  No edema; No deformity   ASSESSMENT AND PLAN: .     HTN - BP well controlled. Continue current antihypertensive regimen.    Exertional dyspnea- Echo 06/02/22 normal LVEF, elevated  LVEDP, mild MR. Myoview 06/06/22 low risk.  Exertional dyspnea was improving as she was participating in PREP exercise program. Now exacerbated by volume overload, management below. If persistent dyspnea despite escalation of diastolic heart failure therapy, consider ischemic evaluation. No chest pain, pressure, tightness.   Chronic diastolic heart failure- Volume up symptomatic with LE edema, dyspnea.. Continue furosemide 120 mg daily. Add Farxiga 10mg  every day. CMP/BNP in 1-2 weeks. Low sodium diet, fluid restriction <2L, and daily weights encouraged. Educated to contact our office for weight gain of 2 lbs overnight or 5 lbs in one week.   Hyperlipidemia-05/2022 LDL 65.  Continue atorvastatin, aspirin due to history of CVA.       Dispo: follow up in 1 months Dr. Duke Salvia or Alver Sorrow, NP   Signed, Alver Sorrow, NP

## 2023-01-31 NOTE — Patient Instructions (Signed)
Medication Instructions:  Your physician has recommended you make the following change in your medication:   Start: [place] 10mg  daily   *If you need a refill on your cardiac medications before your next appointment, please call your pharmacy*   Lab Work: Your physician recommends that you return for lab work in 1-2 weeks for CMP and BNP   If you have labs (blood work) drawn today and your tests are completely normal, you will receive your results only by: MyChart Message (if you have MyChart) OR A paper copy in the mail If you have any lab test that is abnormal or we need to change your treatment, we will call you to review the results.  Follow-Up: At [facility], you and your health needs are our priority.  As part of our continuing mission to provide you with exceptional heart care, we have created designated Provider Care Teams.  These Care Teams include your primary Cardiologist (physician) and Advanced Practice Providers (APPs -  Physician Assistants and Nurse Practitioners) who all work together to provide you with the care you need, when you need it.  We recommend signing up for the patient portal called "MyChart".  Sign up information is provided on this After Visit Summary.  MyChart is used to connect with patients for Virtual Visits (Telemedicine).  Patients are able to view lab/test results, encounter notes, upcoming appointments, etc.  Non-urgent messages can be sent to your provider as well.   To learn more about what you can do with MyChart, go to [email or web address].    Your next appointment:   1 month with Gillian Shields, NP

## 2023-01-31 NOTE — Telephone Encounter (Signed)
Patient stated on [place] today in clinic, will likely need prior auth

## 2023-02-01 ENCOUNTER — Other Ambulatory Visit (HOSPITAL_COMMUNITY): Payer: Self-pay

## 2023-02-01 NOTE — Telephone Encounter (Signed)
Pharmacy Patient Advocate Encounter   Received notification from Physician's Office that prior authorization for [facility] is required/requested.   Insurance verification completed.   The patient is insured through [facility]  .   Per test claim: Refill too soon. PA is not needed at this time. Medication was filled 01/31/23. Next eligible fill date is 02/23/23.

## 2023-02-05 ENCOUNTER — Encounter (HOSPITAL_BASED_OUTPATIENT_CLINIC_OR_DEPARTMENT_OTHER): Payer: Self-pay | Admitting: Family

## 2023-02-10 DIAGNOSIS — B0089 Other herpesviral infection: Secondary | ICD-10-CM | POA: Diagnosis not present

## 2023-02-13 DIAGNOSIS — M3501 Sicca syndrome with keratoconjunctivitis: Secondary | ICD-10-CM | POA: Diagnosis not present

## 2023-02-13 DIAGNOSIS — M359 Systemic involvement of connective tissue, unspecified: Secondary | ICD-10-CM | POA: Diagnosis not present

## 2023-02-13 DIAGNOSIS — M79643 Pain in unspecified hand: Secondary | ICD-10-CM | POA: Diagnosis not present

## 2023-02-13 DIAGNOSIS — Z79899 Other long term (current) drug therapy: Secondary | ICD-10-CM | POA: Diagnosis not present

## 2023-02-16 DIAGNOSIS — Z9889 Other specified postprocedural states: Secondary | ICD-10-CM | POA: Diagnosis not present

## 2023-02-16 DIAGNOSIS — H16121 Filamentary keratitis, right eye: Secondary | ICD-10-CM | POA: Diagnosis not present

## 2023-02-16 DIAGNOSIS — H40003 Preglaucoma, unspecified, bilateral: Secondary | ICD-10-CM | POA: Diagnosis not present

## 2023-02-16 DIAGNOSIS — H1189 Other specified disorders of conjunctiva: Secondary | ICD-10-CM | POA: Diagnosis not present

## 2023-02-16 DIAGNOSIS — E854 Organ-limited amyloidosis: Secondary | ICD-10-CM | POA: Diagnosis not present

## 2023-02-16 DIAGNOSIS — Z79899 Other long term (current) drug therapy: Secondary | ICD-10-CM | POA: Diagnosis not present

## 2023-02-16 DIAGNOSIS — H119 Unspecified disorder of conjunctiva: Secondary | ICD-10-CM | POA: Diagnosis not present

## 2023-02-21 ENCOUNTER — Encounter (HOSPITAL_BASED_OUTPATIENT_CLINIC_OR_DEPARTMENT_OTHER): Payer: Self-pay | Admitting: Pulmonary Disease

## 2023-02-21 ENCOUNTER — Ambulatory Visit (HOSPITAL_BASED_OUTPATIENT_CLINIC_OR_DEPARTMENT_OTHER): Payer: Medicare Other | Admitting: Pulmonary Disease

## 2023-02-21 VITALS — BP 130/68 | HR 67 | Resp 14 | Ht 66.0 in | Wt 212.2 lb

## 2023-02-21 DIAGNOSIS — I5032 Chronic diastolic (congestive) heart failure: Secondary | ICD-10-CM

## 2023-02-21 DIAGNOSIS — I10 Essential (primary) hypertension: Secondary | ICD-10-CM | POA: Diagnosis not present

## 2023-02-21 DIAGNOSIS — J4540 Moderate persistent asthma, uncomplicated: Secondary | ICD-10-CM | POA: Diagnosis not present

## 2023-02-21 NOTE — Assessment & Plan Note (Signed)
She will continue using Advair on an as-needed basis for persistent symptoms she also has albuterol as needed for rescue

## 2023-02-21 NOTE — Patient Instructions (Addendum)
Refills will be provided as needed

## 2023-02-21 NOTE — Progress Notes (Signed)
   Subjective:    Patient ID: [name], female    DOB: [birth year], 72 y.o.   MRN: [id number]  HPI  72 yo remote smoker for FU of shortness of breath. Being treated as asthma due to  - relief with Symbicort  -reversibility on PFTs    PMH -hypertension, HFpEF Lymphoma 2008 SLE on Plaquenil ( sayed )  Syncope, has a loop recorder   She smoked less than 10 pack years starting in her 30s and quit in 1995. She reports a diagnosis of asthma many years ago Dr. Corinda Gubler gave her albuterol, triggers being odors, seasonal allergies.   Annual follow-up visit. She has not had any interim flareups of her chest colds.  In fact she has not taken Advair in the last 2 months and now only takes it on an as-needed basis. She continues on Plaquenil for lupus.  She underwent bone marrow biopsy which was negative for amyloid. She reports increasing pedal edema in spite of taking Lasix, she has follow-up with cardiologist. Chest x-ray 02/2022 was within normal limits    Significant tests/ events reviewed   BNP 213 RAST panel negative, IgE less than 2   08/2021 Spirometry -moderate restriction with some bronchodilator response.  Ratio 83, FEV1 61%, FVC 57%, 17% improvement in FVC with albuterol   CT abdomen 11/2016 in 2015 shows mild chronic bibasilar pulmonary scarring  Review of Systems neg for any significant sore throat, dysphagia, itching, sneezing, nasal congestion or excess/ purulent secretions, fever, chills, sweats, unintended wt loss, pleuritic or exertional cp, hempoptysis, orthopnea pnd or change in chronic leg swelling. Also denies presyncope, palpitations, heartburn, abdominal pain, nausea, vomiting, diarrhea or change in bowel or urinary habits, dysuria,hematuria, rash, arthralgias, visual complaints, headache, numbness weakness or ataxia.     Objective:   Physical Exam  Gen. Pleasant, obese, in no distress ENT - no lesions, no post nasal drip Neck: No JVD, no thyromegaly, no  carotid bruits Lungs: no use of accessory muscles, no dullness to percussion, decreased without rales or rhonchi  Cardiovascular: Rhythm regular, heart sounds  normal, no murmurs or gallops, 1+ peripheral edema Musculoskeletal: No deformities, no cyanosis or clubbing , no tremors       Assessment & Plan:

## 2023-02-21 NOTE — Assessment & Plan Note (Signed)
Continue Lasix. She is awaiting cardiology reassessment

## 2023-02-22 LAB — COMPREHENSIVE METABOLIC PANEL
Calcium: 8.7 mg/dL (ref 8.7–10.3)
Potassium: 3.6 mmol/L (ref 3.5–5.2)
Sodium: 142 mmol/L (ref 134–144)

## 2023-02-23 ENCOUNTER — Telehealth (HOSPITAL_BASED_OUTPATIENT_CLINIC_OR_DEPARTMENT_OTHER): Payer: Self-pay

## 2023-02-23 NOTE — Telephone Encounter (Addendum)
Results called to patient who verbalizes understanding! When inquired about farxiga, patient states the fluid goes down really well at night, but as soon as she gets up during the day the swelling starts coming back to her legs, ankles and feet.    ----- Message from Alver Sorrow sent at 02/23/2023  7:52 AM EDT ----- Normal kidneys, liver, electrolytes.  Alkaline phosphatase mildly elevated, ensure reducing intake of fried/fatty foods.  BNP with mild volume overload.   Can we inquire how she is feeling/how weights have been since starting [place]? TY!

## 2023-02-23 NOTE — Telephone Encounter (Signed)
Returned call to patient and reviewed provider recommendations, patient is agreeable and verbalizes understanding.

## 2023-02-23 NOTE — Telephone Encounter (Signed)
Important to restrict to <2L fluid intake (64 oz), follow low salt diet, elevate legs when sitting, wear compression stockings in daytime.   May take Furosemide 120mg  AM and 80mg  (1 tablet) in the afternoon x 3 days. Then return to just 120mg  QAM.  Continue Marcelline Deist.   follow up as scheduled.   Alver Sorrow, NP

## 2023-03-02 NOTE — Progress Notes (Signed)
Cardiology Office Note:  .   Date:  03/03/2023  ID:  [name], DOB May 15, 1951, MRN [id number] PCP: Georgianne Fick, MD  [facility] Providers Cardiologist:  Chilton Si, MD    History of Present Illness: .   [name] is a 72 y.o. female  hx of prior stroke, rheumatoid arthritis, lupus, interstitial lung disease, chronic diastolic heart failure, hyperlipidemia, hypertension, prior lymphoplasmacytoid lymphoma, asthma.  Prior echocardiogram 01/2018 LVEF 60 to 65% with grade 2 diastolic dysfunction.  Seen in ED 10/22 with chest pain.  EKG revealed diffuse T wave inversion.  Cardiac enzymes negative X.3.  Outpatient Lexiscan Myoview 05/2018 LVEF 68%, no ischemia.  Episode of near-syncope 03/2019 metoprolol was discontinued.  Implantable loop recorder placed 12/2019 by Dr. Royann Shivers.  Carotid Dopplers revealed mild stenosis.  She did fall 08/2020 with no associated events on ILR.  Recurrent fall 02/2021 walking into her bathroom and turning her head.  No preceding lightheadedness, dizziness.  She had right hip replacement 03/2021 with some postoperative lower extremity edema.  Her loop recorder was extracted 12/2021.  Evaluated by Dr. Duke Salvia 05/31/2022 noting dyspnea, fatigue with minimal exertion. Subsequent echocardiogram revealed normal LVEF, mild MR, aortic valve sclerosis without stenosis, elevated LVEDP which was similar to previous.  Myoview 06/06/2022 low risk with no evidence of ischemia.  She saw Dr. 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  Renal duplex 07/05/2022 normal with no changes associated with amyloid.  Bone marrow biopsy performed 07/12/2022. No recurrent cancer found.  Seen 07/26/2022 given preop clearance provided for excision of conjunctival lesion.  Referred to and completed PREP exercise program.   At visit 01/31/23 noted to be volume up with LE edema, dyspnea. Farxiga 10mg  daily added.   She  presents today for follow-up.  Weight down 6 pounds from last clinic visit. She notes her swelling is about the same. When she wakes in the morning no swelling then notes bilateral swelling by the time she finishes her shower. She is keeping her feet up. Tells me she can't stand compression socks so does not wear routinely as they bother her with toe cramping and bother her arthritis. Her exertional dyspnea is better since [place]. Does feel Lasix pulls her fluid off but then it recurs. Reports no lightheadedness, dizziness, palpitations, chest pain.   ROS: Please see the history of present illness.    All other systems reviewed and are negative.   Studies Reviewed: .        Cardiac Studies & Procedures     STRESS TESTS  MYOCARDIAL PERFUSION IMAGING 06/06/2022  Narrative   The study is normal. The study is low risk.   No ST deviation was noted. The ECG was not diagnostic due to pharmacologic protocol.   LV perfusion is normal. There is no evidence of ischemia. There is no evidence of infarction.   Left ventricular function is normal. Nuclear stress EF: 64 %. The left ventricular ejection fraction is normal (55-65%). End diastolic cavity size is normal. End systolic cavity size is normal.   Prior study available for comparison from 05/08/2018. No changes compared to prior study.   ECHOCARDIOGRAM  ECHOCARDIOGRAM COMPLETE 06/02/2022  Narrative ECHOCARDIOGRAM REPORT    Patient Name:   [name] Date of Exam: 06/02/2022 Medical Rec #:  [id number]       Height:       66.0 in Accession #:    [id number]      Weight:  211.2 lb Date of Birth:  March 31, 1951       BSA:          2.047 m Patient Age:    72 years        BP:           110/78 mmHg Patient Gender: F               HR:           74 bpm. Exam Location:  Outpatient  Procedure: 2D Echo, 3D Echo, Color Doppler, Cardiac Doppler and Strain Analysis  Indications:    Dyspnea  History:        Patient has prior history of  Echocardiogram examinations, most recent 08/21/2019. Stroke, Signs/Symptoms:Syncope and Chest Pain; Risk Factors:Hypertension, Former Smoker and Dyslipidemia. Carotid Stneosis.  Sonographer:    Jeryl Columbia RDCS Referring Phys: [id number] TIFFANY Saranap  IMPRESSIONS   1. Left ventricular ejection fraction, by estimation, is 65 to 70%. The left ventricle has normal function. The left ventricle has no regional wall motion abnormalities. Left ventricular diastolic parameters are indeterminate. Elevated left ventricular end-diastolic pressure. The average left ventricular global longitudinal strain is -16.6 %. The global longitudinal strain is normal. 2. Right ventricular systolic function is normal. The right ventricular size is normal. There is normal pulmonary artery systolic pressure. 3. The mitral valve is normal in structure. Mild mitral valve regurgitation. No evidence of mitral stenosis. 4. The aortic valve is tricuspid. There is mild calcification of the aortic valve. Aortic valve regurgitation is not visualized. No aortic stenosis is present. 5. The inferior vena cava is normal in size with greater than 50% respiratory variability, suggesting right atrial pressure of 3 mmHg.  Comparison(s): No significant change from prior study.  FINDINGS Left Ventricle: Left ventricular ejection fraction, by estimation, is 65 to 70%. The left ventricle has normal function. The left ventricle has no regional wall motion abnormalities. The average left ventricular global longitudinal strain is -16.6 %. The global longitudinal strain is normal. The left ventricular internal cavity size was normal in size. There is borderline left ventricular hypertrophy. Left ventricular diastolic parameters are indeterminate. Elevated left ventricular end-diastolic pressure.  Right Ventricle: The right ventricular size is normal. No increase in right ventricular wall thickness. Right ventricular systolic function is  normal. There is normal pulmonary artery systolic pressure. The tricuspid regurgitant velocity is 1.99 m/s, and with an assumed right atrial pressure of 3 mmHg, the estimated right ventricular systolic pressure is 18.8 mmHg.  Left Atrium: Left atrial size was normal in size.  Right Atrium: Right atrial size was normal in size.  Pericardium: Trivial pericardial effusion is present.  Mitral Valve: The mitral valve is normal in structure. Mild mitral valve regurgitation. No evidence of mitral valve stenosis.  Tricuspid Valve: The tricuspid valve is normal in structure. Tricuspid valve regurgitation is trivial. No evidence of tricuspid stenosis.  Aortic Valve: The aortic valve is tricuspid. There is mild calcification of the aortic valve. Aortic valve regurgitation is not visualized. No aortic stenosis is present.  Pulmonic Valve: The pulmonic valve was not well visualized. Pulmonic valve regurgitation is not visualized. No evidence of pulmonic stenosis.  Aorta: The aortic root and ascending aorta are structurally normal, with no evidence of dilitation.  Venous: The inferior vena cava is normal in size with greater than 50% respiratory variability, suggesting right atrial pressure of 3 mmHg.  IAS/Shunts: The atrial septum is grossly normal.   LEFT VENTRICLE PLAX 2D LVIDd:  3.56 cm   Diastology LVIDs:         2.36 cm   LV e' medial:    6.31 cm/s LV PW:         1.21 cm   LV E/e' medial:  19.5 LV IVS:        0.97 cm   LV e' lateral:   6.85 cm/s LVOT diam:     1.80 cm   LV E/e' lateral: 18.0 LV SV:         58 LV SV Index:   28        2D Longitudinal Strain LVOT Area:     2.54 cm  2D Strain GLS (A2C):   -17.5 % 2D Strain GLS (A3C):   -14.5 % 2D Strain GLS (A4C):   -17.7 % 2D Strain GLS Avg:     -16.6 %  3D Volume EF: 3D EF:        67 % LV EDV:       103 ml LV ESV:       34 ml LV SV:        70 ml  RIGHT VENTRICLE RV Basal diam:  3.45 cm RV Mid diam:    2.31 cm RV S prime:      13.50 cm/s TAPSE (M-mode): 2.2 cm  LEFT ATRIUM             Index        RIGHT ATRIUM           Index LA diam:        3.20 cm 1.56 cm/m   RA Area:     12.80 cm LA Vol (A2C):   49.8 ml 24.33 ml/m  RA Volume:   26.70 ml  13.05 ml/m LA Vol (A4C):   61.3 ml 29.95 ml/m LA Biplane Vol: 55.8 ml 27.26 ml/m AORTIC VALVE LVOT Vmax:   103.00 cm/s LVOT Vmean:  69.200 cm/s LVOT VTI:    0.228 m  AORTA Ao Root diam: 2.40 cm Ao Asc diam:  3.00 cm  MITRAL VALVE                TRICUSPID VALVE MV Area (PHT): 4.31 cm     TR Peak grad:   15.8 mmHg MV Decel Time: 176 msec     TR Vmax:        199.00 cm/s MR Peak grad: 50.4 mmHg MR Vmax:      355.00 cm/s   SHUNTS MV E velocity: 123.00 cm/s  Systemic VTI:  0.23 m MV A velocity: 74.40 cm/s   Systemic Diam: 1.80 cm MV E/A ratio:  1.65  Jodelle Red MD Electronically signed by Jodelle Red MD Signature Date/Time: 06/02/2022/9:59:04 AM    Final    MONITORS  CARDIAC EVENT MONITOR 10/14/2019  Narrative 30 Day Event Monitor  Quality: Fair.  Baseline artifact. Predominant rhythm: sinus rhythm Average heart rate: 61 bpm Max heart rate: 106 bpm Min heart rate: 58 bpm  No arrhythmias  Tiffany C. Duke Salvia, MD, [facility] 10/23/2019 8:44 AM           Risk Assessment/Calculations:            Physical Exam:   VS:  BP 120/70   Pulse 88   Ht 5\' 6"  (1.676 m)   Wt 206 lb (93.4 kg)   SpO2 96%   BMI 33.25 kg/m    Wt Readings from Last 3 Encounters:  03/03/23 206 lb (93.4 kg)  02/21/23 212 lb  3.2 oz (96.3 kg)  01/31/23 214 lb (97.1 kg)    GEN: Well nourished, well developed in no acute distress NECK: No JVD; No carotid bruits CARDIAC: RRR, no murmurs, rubs, gallops RESPIRATORY:  Clear to auscultation without rales, wheezing or rhonchi  ABDOMEN: Soft, non-tender, non-distended EXTREMITIES:  Nonpitting bilateral pedal edema; No deformity   ASSESSMENT AND PLAN: .     HTN - BP well controlled. Continue current  antihypertensive regimen.    Exertional dyspnea- Echo 06/02/22 normal LVEF, elevated LVEDP, mild MR. Myoview 06/06/22 low risk. Dyspnea improved since addition of [place].   Chronic diastolic heart failure-  Still with bothersome bilateral edema worse by end of day, venous insufficiency likely contributory. Continue Farxiga 10mg  daily. Stop Lasix, start Torsemide 60mg  daily. BMP/BNP in 1 week. If not improved, consdier addition of Spironolactone with reduction of potassium supplement. Continue elevating legs, does not tolerate compression socks.  Low sodium diet, fluid restriction <2L, and daily weights encouraged. Educated to contact our office for weight gain of 2 lbs overnight or 5 lbs in one week.   Hyperlipidemia-05/2022 LDL 65.  Continue atorvastatin, aspirin due to history of CVA.       Dispo: follow up in 6-8 weeks Dr. Duke Salvia or Alver Sorrow, NP   Signed, Alver Sorrow, NP

## 2023-03-03 ENCOUNTER — Ambulatory Visit (HOSPITAL_BASED_OUTPATIENT_CLINIC_OR_DEPARTMENT_OTHER): Payer: Medicare Other | Admitting: Family

## 2023-03-03 ENCOUNTER — Encounter (HOSPITAL_BASED_OUTPATIENT_CLINIC_OR_DEPARTMENT_OTHER): Payer: Self-pay | Admitting: Family

## 2023-03-03 VITALS — BP 120/70 | HR 88 | Ht 66.0 in | Wt 206.0 lb

## 2023-03-03 DIAGNOSIS — I10 Essential (primary) hypertension: Secondary | ICD-10-CM | POA: Diagnosis not present

## 2023-03-03 DIAGNOSIS — E782 Mixed hyperlipidemia: Secondary | ICD-10-CM

## 2023-03-03 DIAGNOSIS — I5032 Chronic diastolic (congestive) heart failure: Secondary | ICD-10-CM

## 2023-03-03 DIAGNOSIS — R0609 Other forms of dyspnea: Secondary | ICD-10-CM | POA: Diagnosis not present

## 2023-03-03 MED ORDER — DAPAGLIFLOZIN PROPANEDIOL 10 MG PO TABS
10.0000 mg | ORAL_TABLET | Freq: Every day | ORAL | 3 refills | Status: DC
Start: 2023-03-03 — End: 2023-04-14

## 2023-03-03 MED ORDER — TORSEMIDE 20 MG PO TABS
60.0000 mg | ORAL_TABLET | Freq: Every day | ORAL | 1 refills | Status: DC
Start: 2023-03-03 — End: 2023-05-01

## 2023-03-03 NOTE — Patient Instructions (Addendum)
Medication Instructions:  Your physician has recommended you make the following change in your medication:   STOP Furosemide   START Torsemide 60mg  (3 tablets) daily  *If you need a refill on your cardiac medications before your next appointment, please call your pharmacy*   Lab Work: Your physician recommends that you return for lab work in one week for BMP/BNP  If you have labs (blood work) drawn today and your tests are completely normal, you will receive your results only by: MyChart Message (if you have MyChart) OR A paper copy in the mail If you have any lab test that is abnormal or we need to change your treatment, we will call you to review the results.  Follow-Up: At [facility], you and your health needs are our priority.  As part of our continuing mission to provide you with exceptional heart care, we have created designated Provider Care Teams.  These Care Teams include your primary Cardiologist (physician) and Advanced Practice Providers (APPs -  Physician Assistants and Nurse Practitioners) who all work together to provide you with the care you need, when you need it.  We recommend signing up for the patient portal called "MyChart".  Sign up information is provided on this After Visit Summary.  MyChart is used to connect with patients for Virtual Visits (Telemedicine).  Patients are able to view lab/test results, encounter notes, upcoming appointments, etc.  Non-urgent messages can be sent to your provider as well.   To learn more about what you can do with MyChart, go to [email or web address].    Your next appointment:   6-8 week(s)  Provider:   Chilton Si, MD or Gillian Shields, NP    Other Instructions  To prevent or reduce lower extremity swelling: Eat a low salt diet. Salt makes the body hold onto extra fluid which causes swelling. Sit with legs elevated. For example, in the recliner or on an ottoman.  Wear knee-high compression stockings during  the daytime. Ones labeled 15-20 mmHg provide good compression.

## 2023-03-09 ENCOUNTER — Telehealth: Payer: Self-pay | Admitting: Family

## 2023-03-09 ENCOUNTER — Other Ambulatory Visit (HOSPITAL_COMMUNITY): Payer: Self-pay | Admitting: *Deleted

## 2023-03-09 ENCOUNTER — Telehealth (HOSPITAL_COMMUNITY): Payer: Self-pay | Admitting: *Deleted

## 2023-03-09 DIAGNOSIS — R1310 Dysphagia, unspecified: Secondary | ICD-10-CM

## 2023-03-09 DIAGNOSIS — N390 Urinary tract infection, site not specified: Secondary | ICD-10-CM

## 2023-03-09 NOTE — Telephone Encounter (Signed)
Please advise 

## 2023-03-09 NOTE — Telephone Encounter (Signed)
Attempted to contact patient to schedule OP MBS. Left VM. RKEEL 

## 2023-03-09 NOTE — Telephone Encounter (Signed)
UA ordered, advised patient of recommendations! She will come tomorrow for labs.

## 2023-03-09 NOTE — Telephone Encounter (Signed)
Add UA. Hold [place].  Alver Sorrow, NP

## 2023-03-09 NOTE — Telephone Encounter (Signed)
Pt c/o medication issue:  1. Name of Medication: dapagliflozin propanediol (FARXIGA) 10 MG TABS tablet   2. How are you currently taking this medication (dosage and times per day)? As written   3. Are you having a reaction (difficulty breathing--STAT)? No   4. What is your medication issue? Pt called in stating Cailitin, NP had told her she may get a UTI from this medication and she believes she has got something, either a yeast infection or UTI, but not quite sure what it is. She states she is coming in for blood work and wants to  know if a test can added to determine what the infection is. Please advise.

## 2023-03-10 DIAGNOSIS — I5032 Chronic diastolic (congestive) heart failure: Secondary | ICD-10-CM | POA: Diagnosis not present

## 2023-03-10 DIAGNOSIS — N390 Urinary tract infection, site not specified: Secondary | ICD-10-CM | POA: Diagnosis not present

## 2023-03-10 LAB — URINALYSIS
Bilirubin, UA: NEGATIVE
Ketones, UA: NEGATIVE
Nitrite, UA: NEGATIVE
Specific Gravity, UA: 1.02 (ref 1.005–1.030)
Urobilinogen, Ur: 0.2 mg/dL (ref 0.2–1.0)
pH, UA: 5.5 (ref 5.0–7.5)

## 2023-03-11 LAB — BASIC METABOLIC PANEL
BUN/Creatinine Ratio: 17 (ref 12–28)
BUN: 18 mg/dL (ref 8–27)
CO2: 23 mmol/L (ref 20–29)
Calcium: 8.8 mg/dL (ref 8.7–10.3)
Chloride: 106 mmol/L (ref 96–106)
Creatinine, Ser: 1.07 mg/dL — ABNORMAL HIGH (ref 0.57–1.00)
Glucose: 97 mg/dL (ref 70–99)
Potassium: 4.2 mmol/L (ref 3.5–5.2)
Sodium: 145 mmol/L — ABNORMAL HIGH (ref 134–144)
eGFR: 55 mL/min/{1.73_m2} — ABNORMAL LOW (ref >59)

## 2023-03-11 LAB — BRAIN NATRIURETIC PEPTIDE: BNP: 161 pg/mL — ABNORMAL HIGH (ref 0.0–100.0)

## 2023-03-13 ENCOUNTER — Telehealth (HOSPITAL_BASED_OUTPATIENT_CLINIC_OR_DEPARTMENT_OTHER): Payer: Self-pay | Admitting: Family

## 2023-03-13 ENCOUNTER — Telehealth (HOSPITAL_BASED_OUTPATIENT_CLINIC_OR_DEPARTMENT_OTHER): Payer: Self-pay

## 2023-03-13 MED ORDER — NITROFURANTOIN MONOHYD MACRO 100 MG PO CAPS: 100.0000 mg | ORAL_CAPSULE | Freq: Two times a day (BID) | ORAL | 0 refills | Status: DC

## 2023-03-13 NOTE — Telephone Encounter (Signed)
Hey there, thank your for helping with [name]'s basket while she is out, this is follow up from the labs you resulted this morning, please advise.

## 2023-03-13 NOTE — Telephone Encounter (Signed)
Pt c/o medication issue:  1. Name of Medication:  nitrofurantoin 100 mg   2. How are you currently taking this medication (dosage and times per day)?   3. Are you having a reaction (difficulty breathing--STAT)?   4. What is your medication issue? Patient wants to know if this medication will cause a yeast infection, and if it dose if something else can be called in that doesn't cause a yeast infection.     Pt c/o medication issue:  1. Name of Medication: farxiga  2. How are you currently taking this medication (dosage and times per day)?   3. Are you having a reaction (difficulty breathing--STAT)?   4. What is your medication issue?  She was also told last week to stop taking farxiga.  She states she just received on Saturday from the mail order pharmacy a 90 day supply of the medication she wants to know if she is to completely stop taking the medication. Please advise.

## 2023-03-13 NOTE — Telephone Encounter (Signed)
Returned call to patient, and provided provider recommendations.  "Unfortunately all antibiotics have the potential to cause a yeast infection. If it happens we'll have to deal with that as well. Her UTI needs treated foremost , thanks"

## 2023-03-13 NOTE — Telephone Encounter (Addendum)
Seen by patient [name] on 03/13/2023 10:24 AM; follow up mychart message sent to patient.   ----- Message from Jacolyn Reedy sent at 03/13/2023 10:09 AM EDT ----- I'm covering for Caitlyn. It looks like she has a UTI. Please send in nitrofurantoin 100 mg bid x 5 days thanks.

## 2023-03-21 ENCOUNTER — Ambulatory Visit (HOSPITAL_COMMUNITY)
Admission: RE | Admit: 2023-03-21 | Discharge: 2023-03-21 | Disposition: A | Discharge status: Home / Self Care | Payer: Medicare Other | Source: Ambulatory Visit | Attending: Internal Medicine | Admitting: Internal Medicine

## 2023-03-21 ENCOUNTER — Ambulatory Visit (HOSPITAL_COMMUNITY)
Admission: RE | Admit: 2023-03-21 | Discharge: 2023-03-21 | Disposition: A | Discharge status: Home / Self Care | Payer: Medicare Other | Source: Ambulatory Visit | Attending: Otolaryngology

## 2023-03-21 DIAGNOSIS — R09A2 Foreign body sensation, throat: Secondary | ICD-10-CM | POA: Insufficient documentation

## 2023-03-21 DIAGNOSIS — R058 Other specified cough: Secondary | ICD-10-CM | POA: Insufficient documentation

## 2023-03-21 DIAGNOSIS — R1319 Other dysphagia: Secondary | ICD-10-CM | POA: Diagnosis not present

## 2023-03-21 DIAGNOSIS — R638 Other symptoms and signs concerning food and fluid intake: Secondary | ICD-10-CM | POA: Diagnosis not present

## 2023-03-21 DIAGNOSIS — K219 Gastro-esophageal reflux disease without esophagitis: Secondary | ICD-10-CM | POA: Diagnosis not present

## 2023-03-21 DIAGNOSIS — R059 Cough, unspecified: Secondary | ICD-10-CM | POA: Diagnosis not present

## 2023-03-21 DIAGNOSIS — R1310 Dysphagia, unspecified: Secondary | ICD-10-CM

## 2023-03-21 DIAGNOSIS — T17208D Unspecified foreign body in pharynx causing other injury, subsequent encounter: Secondary | ICD-10-CM | POA: Insufficient documentation

## 2023-03-21 NOTE — Progress Notes (Signed)
Modified Barium Swallow Study  Patient Details  Name: [name] MRN: [id number] Date of Birth: October 08, 1950  Today's Date: 03/21/2023  Modified Barium Swallow completed.  Full report located under Chart Review in the Imaging Section.  History of Present Illness [name] is a 72 y.o. y.o.f. with PMHx of lymphoma plasmacytic lymphoma in remission since 2010, followed by oncology last seen December 13, 2022. She was referred for OP MBS due to c/o choking and coughing during meals.  Per ENT notes, she endorses food lodging in her chest, but this sensation is relieved by drinking water. Symptoms are intermittent. Sometimes, she can eat without any issues, but at other times, she starts coughing immediately after eating, which then subsides. Symptoms have been present since May 2024. No hx pna; no changes in voice nor SOB. Hx acid reflux and ACDF. Underwent flexible laryngoscopy 01/31/23 - normal findings.   Clinical Impression Pt presents with a likely esophageal dysphagia marked by esophageal retention with retrograde flow below pharyngoesophageal segment (PES).  Oropharyngeal swallow was [facility] with normal oral function, onset of pharyngeal swallow at level of pyriforms, occasional high penetration of thin liquids (Penetration/Aspiration score of 2 - material ejected from larynx upon completion of swallow; considered [facility]). Partway through the study, [name] began belching and coughing - scan of the esophagus revealed barium retention and retrograde flow. Results/recommendations reviewed with pt. Recommend esophagram.  Handout with recs specific to esophageal deficits was provided. Factors that may increase risk of adverse event in presence of aspiration [name] & [name] 2021):  n/a  Swallow Evaluation Recommendations Recommendations: PO diet PO Diet Recommendation: Regular;Thin liquids (Level 0) Liquid Administration via: Cup;Straw Medication Administration: Whole meds with liquid Supervision:  Patient able to self-feed Postural changes: Stay upright 30-60 min after meals Oral care recommendations: Oral care BID (2x/day) Recommended consults: Consider esophageal assessment    Erica Sima L. Samson Frederic, MA CCC/SLP Clinical Specialist - Acute Care SLP Acute Rehabilitation Services Office number [phone]   Blenda [name] Erica 03/21/2023,3:55 PM

## 2023-03-28 DIAGNOSIS — M47816 Spondylosis without myelopathy or radiculopathy, lumbar region: Secondary | ICD-10-CM | POA: Diagnosis not present

## 2023-03-28 NOTE — Progress Notes (Signed)
SPEECH PATHOLOGY: MBS. LATE ENTRY FROM 03/21/23  Unable to post note in imaging  03/21/23 1500  SLP Visit Information  SLP Received On 03/21/23  Pain Assessment  Pain Assessment No/denies pain  General Information  HPI Shria Booz is a 72 y.o. y.o.f. with PMHx of lymphoma plasmacytic lymphoma in remission since 2010, followed by oncology last seen December 13, 2022. She was referred for OP MBS due to c/o choking and coughing during meals.  Per ENT notes, she endorses food lodging in her chest, but this sensation is relieved by drinking water. Symptoms are intermittent. Sometimes, she can eat without any issues, but at other times, she starts coughing immediately after eating, which then subsides. Symptoms have been present since May 2024. No hx pna; no changes in voice nor SOB. Hx acid reflux and ACDF. Underwent flexible laryngoscopy 01/31/23 - normal findings.  Caregiver present No  Diet Prior to this Study Regular;Thin liquids (Level 0)  Respiratory Status WFL  Supplemental O2 None (Room air)  History of Recent Intubation No  Behavior/Cognition Alert;Cooperative;Pleasant mood  Self-Feeding Abilities Able to self-feed  Baseline vocal quality/speech Normal  Volitional Cough Able to elicit  Volitional Cough Assessment Appears WFL  Volitional Swallow Able to elicit  Anatomy Presence of cervical hardware  Orofacial Exam  Oral Cavity: Oral Hygiene WFL  Boluses Administered  Boluses Administered Thin liquids (Level 0);Mildly thick liquids (Level 2, nectar thick);Moderately thick liquids (Level 3, honey thick);Puree  Oral Impairment Domain  Lip Closure No labial escape  Tongue control during bolus hold Cohesive bolus between tongue to palatal seal  Bolus preparation/mastication Timely and efficient chewing and mashing  Bolus transport/lingual motion Brisk tongue motion  Oral residue Complete oral clearance  Location of oral residue  N/A  Initiation of pharyngeal swallow  Pyriform sinuses   Pharyngeal Impairment Domain  Soft palate elevation No bolus between soft palate (SP)/pharyngeal wall (PW)  Laryngeal elevation Complete superior movement of thyroid cartilage with complete approximation of arytenoids to epiglottic petiole  Anterior hyoid excursion Partial anterior movement  Epiglottic movement Complete inversion  Laryngeal vestibule closure Incomplete, narrow column air/contrast in laryngeal vestibule  Pharyngeal stripping wave  Present - complete  Pharyngeal contraction (A/P view only) N/A  Pharyngoesophageal segment opening Partial distention/partial duration, partial obstruction of flow  Tongue base retraction No contrast between tongue base and posterior pharyngeal wall (PPW)  Pharyngeal residue Complete pharyngeal clearance  Esophageal Impairment Domain  Esophageal clearance upright position Esophageal retention with retrograde flow below pharyngoesophageal segment (PES)  Pill  Consistency administered  (n/a)  Penetration/Aspiration Scale Score  1.  Material does not enter airway Mildly thick liquids (Level 2, nectar thick);Moderately thick liquids (Level 3, honey thick);Puree  2.  Material enters airway, remains ABOVE vocal cords then ejected out Thin liquids (Level 0)  Compensatory Strategies  Compensatory strategies No  Clinical Impression  Clinical Impression Pt presents with a likely esophageal dysphagia marked by esophageal retention with retrograde flow below pharyngoesophageal segment (PES).  Oropharyngeal swallow was [facility] with normal oral function, onset of pharyngeal swallow at level of pyriforms, occasional high penetration of thin liquids (Penetration/Aspiration score of 2 - material ejected from larynx upon completion of swallow; considered [facility]). Partway through the study, Ms. Cwik began belching and coughing - scan of the esophagus revealed barium retention and retrograde flow. Results/recommendations reviewed with pt. Recommend esophagram.  Handout with  recs specific to esophageal deficits was provided.  SLP Visit Diagnosis Dysphagia, pharyngoesophageal phase (R13.14)  Exam Limitations No limitations  Swallowing  Evaluation Recommendations  Recommendations PO diet  PO Diet Recommendation Regular;Thin liquids (Level 0)  Liquid Administration via Cup;Straw  Medication Administration Whole meds with liquid  Supervision Patient able to self-feed  Postural changes Stay upright 30-60 min after meals  Oral care recommendations Oral care BID (2x/day)  Recommended consults Consider esophageal assessment  Treatment Plan  Treatment recommendations No treatment recommended at this time  Follow-up recommendations No SLP follow up  Goal Planning  Consulted and agree with results and recommendations Patient  SLP Time Calculation  SLP Start Time (ACUTE ONLY) 1105  SLP Stop Time (ACUTE ONLY) 1125  SLP Time Calculation (min) (ACUTE ONLY) 20 min  SLP Evaluations  $ SLP Speech Visit 1 Visit  SLP Evaluations  $Outpatient MBS Swallow 1 Procedure

## 2023-04-07 DIAGNOSIS — H16121 Filamentary keratitis, right eye: Secondary | ICD-10-CM | POA: Diagnosis not present

## 2023-04-07 DIAGNOSIS — H1189 Other specified disorders of conjunctiva: Secondary | ICD-10-CM | POA: Diagnosis not present

## 2023-04-07 DIAGNOSIS — Z9889 Other specified postprocedural states: Secondary | ICD-10-CM | POA: Diagnosis not present

## 2023-04-07 DIAGNOSIS — H1132 Conjunctival hemorrhage, left eye: Secondary | ICD-10-CM | POA: Diagnosis not present

## 2023-04-07 DIAGNOSIS — H40003 Preglaucoma, unspecified, bilateral: Secondary | ICD-10-CM | POA: Diagnosis not present

## 2023-04-07 DIAGNOSIS — E854 Organ-limited amyloidosis: Secondary | ICD-10-CM | POA: Diagnosis not present

## 2023-04-07 DIAGNOSIS — Z79899 Other long term (current) drug therapy: Secondary | ICD-10-CM | POA: Diagnosis not present

## 2023-04-07 DIAGNOSIS — H119 Unspecified disorder of conjunctiva: Secondary | ICD-10-CM | POA: Diagnosis not present

## 2023-04-07 DIAGNOSIS — H2513 Age-related nuclear cataract, bilateral: Secondary | ICD-10-CM | POA: Diagnosis not present

## 2023-04-10 NOTE — Progress Notes (Unsigned)
Cardiology Office Note:  .   Date:  04/11/2023  ID:  [name], DOB April 30, 1951, MRN [id number] PCP: Georgianne Fick, MD  [facility] Providers Cardiologist:  Chilton Si, MD { Click to update primary MD,subspecialty MD or APP then REFRESH:1}   History of Present Illness: .   [name] is a 72 y.o. female with  hx of prior stroke, rheumatoid arthritis, lupus, interstitial lung disease, chronic diastolic heart failure, hyperlipidemia, hypertension, prior lymphoplasmacytoid lymphoma, asthma.   Prior echocardiogram 01/2018 LVEF 60 to 65% with grade 2 diastolic dysfunction.  Seen in ED 10/22 with chest pain.  EKG revealed diffuse T wave inversion.  Cardiac enzymes negative X.3.  Outpatient Lexiscan Myoview 05/2018 LVEF 68%, no ischemia.   Episode of near-syncope 03/2019 metoprolol was discontinued.  Implantable loop recorder placed 12/2019 by Dr. Royann Shivers.  Carotid Dopplers revealed mild stenosis.  She did fall 08/2020 with no associated events on ILR.  Recurrent fall 02/2021 walking into her bathroom and turning her head.  No preceding lightheadedness, dizziness.  She had right hip replacement 03/2021 with some postoperative lower extremity edema.  Her loop recorder was extracted 12/2021.   Evaluated by Dr. Duke Salvia 05/31/2022 noting dyspnea, fatigue with minimal exertion. Subsequent echocardiogram revealed normal LVEF, mild MR, aortic valve sclerosis without stenosis, elevated LVEDP which was similar to previous.  Myoview 06/06/2022 low risk with no evidence of ischemia.   She saw Dr. 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  Renal duplex 07/05/2022 normal with no changes associated with amyloid.  Bone marrow biopsy performed 07/12/2022. No recurrent cancer found.   Seen 07/26/2022 given preop clearance provided for excision of conjunctival lesion.  Referred to and completed PREP exercise program.    At visit 01/31/23  noted to be volume up with LE edema, dyspnea. Farxiga 10mg  daily added.  Last seen 03/03/2023.  Due to persistent lower extremity edema worse by end of day Lasix was stopped and she was transition to torsemide 60 mg daily.  Marcelline Deist to be discontinued due to UTI which was treated with nitrofurantoin.*** At visit *** Lasix was transitioned to Torsemide for improved volume control.    She presents today for follow-up.  *** Has lost 3 lbs since her last clinic visit with transition from Lasix to Torsemide. Notes "muscle spasm" like feeling in her feet. Swelling is worse if she is on her feet during the day. Previously declined to wear compression stockings. Notes stable mild exertional dyspnea with more than usual ***>   Waking up for 2 hours every night ongoing for years and feels it is more bothersome as she ages. Took some type of antidepressant years ago when she was initially diagnosed with lups but notes it made her feel sleepy even the next day if she did not take it early enough. PCP had her try Tylenol PM for 2 weeks which she has done twice and it did not help. Sleeps with the tv on and has done so for many year. Goes to bed between 9-11pm.   1-2 cups of coffee int he morning and then one pepsi during the day.   She has los  BP at home 120s  ROS: Please see the history of present illness.    All other systems reviewed and are negative.   Studies Reviewed: .         Risk Assessment/Calculations:             Physical Exam:  VS:  BP 118/74   Pulse 85   Ht 5\' 6"  (1.676 m)   Wt 203 lb 14.4 oz (92.5 kg)   SpO2 98%   BMI 32.91 kg/m    Wt Readings from Last 3 Encounters:  04/11/23 203 lb 14.4 oz (92.5 kg)  03/03/23 206 lb (93.4 kg)  02/21/23 212 lb 3.2 oz (96.3 kg)    GEN: Well nourished, overweight, well developed in no acute distress NECK: No JVD; No carotid bruits CARDIAC: RRR, no murmurs, rubs, gallops RESPIRATORY:  Clear to auscultation without rales, wheezing or rhonchi   ABDOMEN: Soft, non-tender, non-distended EXTREMITIES:  1+ pitting pedal edema; No deformity   ASSESSMENT AND PLAN: .    HTN - BP well controlled. Continue current antihypertensive regimen.  Home BP reading routinely in the 120s.    Exertional dyspnea- Echo 06/02/22 normal LVEF, elevated LVEDP, mild MR. Myoview 06/06/22 low risk. Dyspnea improved since addition of [place]. Further improved since transition from Lasix to Torsemide. ***   Chronic diastolic heart failure / Venous insufficiency-  Still with bothersome bilateral edema worse by end of day, venous insufficiency likely contributory. Continue Farxiga 10mg  daily. Stop Lasix, start Torsemide 60mg  daily. BMP/BNP in 1 week. If not improved, consdier addition of Spironolactone with reduction of potassium supplement. Continue elevating legs, does not tolerate compression socks.  Low sodium diet, fluid restriction <2L, and daily weights encouraged. Educated to contact our office for weight gain of 2 lbs overnight or 5 lbs in one week.   Insomnia - Reports longstanding history of difficulty sleeping. PCP recommended 2 week trial of Tylenol PM which did not help much. Given handout for lifestyle changes for sleep. Encouraged to discuss further with her PCP.    Hyperlipidemia-05/2022 LDL 65.  Continue atorvastatin, aspirin due to history of CVA.          Dispo: follow up as scheduled 08/2023 with Dr. Duke Salvia   Signed, Alver Sorrow, NP

## 2023-04-11 ENCOUNTER — Encounter (HOSPITAL_BASED_OUTPATIENT_CLINIC_OR_DEPARTMENT_OTHER): Payer: Self-pay | Admitting: Family

## 2023-04-11 ENCOUNTER — Telehealth: Payer: Self-pay | Admitting: Cardiovascular Disease

## 2023-04-11 ENCOUNTER — Ambulatory Visit (HOSPITAL_BASED_OUTPATIENT_CLINIC_OR_DEPARTMENT_OTHER): Payer: Medicare Other | Admitting: Family

## 2023-04-11 VITALS — BP 118/74 | HR 85 | Ht 66.0 in | Wt 203.9 lb

## 2023-04-11 DIAGNOSIS — I10 Essential (primary) hypertension: Secondary | ICD-10-CM

## 2023-04-11 DIAGNOSIS — R600 Localized edema: Secondary | ICD-10-CM | POA: Diagnosis not present

## 2023-04-11 DIAGNOSIS — E876 Hypokalemia: Secondary | ICD-10-CM

## 2023-04-11 DIAGNOSIS — E782 Mixed hyperlipidemia: Secondary | ICD-10-CM

## 2023-04-11 DIAGNOSIS — I5032 Chronic diastolic (congestive) heart failure: Secondary | ICD-10-CM

## 2023-04-11 DIAGNOSIS — I872 Venous insufficiency (chronic) (peripheral): Secondary | ICD-10-CM | POA: Diagnosis not present

## 2023-04-11 MED ORDER — ROSUVASTATIN CALCIUM 20 MG PO TABS: 20.0000 mg | ORAL_TABLET | Freq: Every day | ORAL | 11 refills | Status: DC

## 2023-04-11 NOTE — Telephone Encounter (Signed)
*  STAT* If patient is at the pharmacy, call can be transferred to refill team.   1. Which medications need to be refilled? (please list name of each medication and dose if known)   rosuvastatin (CRESTOR) 20 MG tablet     2. Would you like to learn more about the convenience, safety, & potential cost savings by using the [facility]? No   3. Are you open to using the [facility] (Type [facility].  ) No   4. Which pharmacy/location (including street and city if local pharmacy) is medication to be sent to? [facility] [address] ([address] [place] - [address]  Phone: [phone]          5. Do they need a 30 day or 90 day supply? 30 day

## 2023-04-11 NOTE — Telephone Encounter (Signed)
Rx sent 

## 2023-04-11 NOTE — Patient Instructions (Addendum)
Medication Instructions:   We will consider adding a medication called Spironolactone to help lower your swelling and increase your potassium based on your blood work today.   *If you need a refill on your cardiac medications before your next appointment, please call your pharmacy*   Lab Work: Your physician recommends that you return for lab work today: BMP, magnesium  If you have labs (blood work) drawn today and your tests are completely normal, you will receive your results only by: MyChart Message (if you have MyChart) OR A paper copy in the mail If you have any lab test that is abnormal or we need to change your treatment, we will call you to review the results.  Follow-Up: At [facility], you and your health needs are our priority.  As part of our continuing mission to provide you with exceptional heart care, we have created designated Provider Care Teams.  These Care Teams include your primary Cardiologist (physician) and Advanced Practice Providers (APPs -  Physician Assistants and Nurse Practitioners) who all work together to provide you with the care you need, when you need it.  We recommend signing up for the patient portal called "MyChart".  Sign up information is provided on this After Visit Summary.  MyChart is used to connect with patients for Virtual Visits (Telemedicine).  Patients are able to view lab/test results, encounter notes, upcoming appointments, etc.  Non-urgent messages can be sent to your provider as well.   To learn more about what you can do with MyChart, go to [email or web address].    Your next appointment:   As scheduled with Dr. Duke Salvia  Other Instructions

## 2023-04-12 LAB — MAGNESIUM: Magnesium: 2.2 mg/dL (ref 1.6–2.3)

## 2023-04-12 LAB — BASIC METABOLIC PANEL
BUN/Creatinine Ratio: 16 (ref 12–28)
BUN: 14 mg/dL (ref 8–27)
CO2: 21 mmol/L (ref 20–29)
Calcium: 9 mg/dL (ref 8.7–10.3)
Chloride: 107 mmol/L — ABNORMAL HIGH (ref 96–106)
Creatinine, Ser: 0.86 mg/dL (ref 0.57–1.00)
Glucose: 107 mg/dL — ABNORMAL HIGH (ref 70–99)
Potassium: 4 mmol/L (ref 3.5–5.2)
Sodium: 146 mmol/L — ABNORMAL HIGH (ref 134–144)
eGFR: 71 mL/min/{1.73_m2} (ref >59)

## 2023-04-13 ENCOUNTER — Encounter (HOSPITAL_BASED_OUTPATIENT_CLINIC_OR_DEPARTMENT_OTHER): Payer: Self-pay

## 2023-04-13 ENCOUNTER — Encounter (HOSPITAL_BASED_OUTPATIENT_CLINIC_OR_DEPARTMENT_OTHER): Payer: Self-pay | Admitting: Family

## 2023-04-13 ENCOUNTER — Telehealth (HOSPITAL_BASED_OUTPATIENT_CLINIC_OR_DEPARTMENT_OTHER): Payer: Self-pay

## 2023-04-13 DIAGNOSIS — I10 Essential (primary) hypertension: Secondary | ICD-10-CM

## 2023-04-13 DIAGNOSIS — I5032 Chronic diastolic (congestive) heart failure: Secondary | ICD-10-CM | POA: Diagnosis not present

## 2023-04-13 DIAGNOSIS — Z8673 Personal history of transient ischemic attack (TIA), and cerebral infarction without residual deficits: Secondary | ICD-10-CM | POA: Diagnosis not present

## 2023-04-13 DIAGNOSIS — E782 Mixed hyperlipidemia: Secondary | ICD-10-CM | POA: Diagnosis not present

## 2023-04-13 DIAGNOSIS — C8300 Small cell B-cell lymphoma, unspecified site: Secondary | ICD-10-CM | POA: Diagnosis not present

## 2023-04-13 MED ORDER — SPIRONOLACTONE 25 MG PO TABS
12.5000 mg | ORAL_TABLET | Freq: Every day | ORAL | 11 refills | Status: DC
Start: 2023-04-13 — End: 2024-05-24

## 2023-04-13 MED ORDER — POTASSIUM CHLORIDE CRYS ER 20 MEQ PO TBCR: 20.0000 meq | EXTENDED_RELEASE_TABLET | Freq: Two times a day (BID) | ORAL | 11 refills | Status: AC

## 2023-04-13 NOTE — Telephone Encounter (Addendum)
Seen by patient [name] on 04/12/2023  8:40 PM, follow up mychart message sent to patient.   ----- Message from Alver Sorrow sent at 04/12/2023 10:57 AM EDT ----- Kidney function improved from previous. Normal potassium, magnesium.   Start Spironolactone 12.5mg  daily. Reduce Potassium supplement to twice daily (presently taking AM and 20 mEq PM)  Repeat BMP in 1 weeks for monitoring.

## 2023-04-13 NOTE — Addendum Note (Signed)
Addended by: Marlene Lard on: 04/13/2023 07:56 AM   Modules accepted: Orders

## 2023-04-25 DIAGNOSIS — I10 Essential (primary) hypertension: Secondary | ICD-10-CM | POA: Diagnosis not present

## 2023-04-25 LAB — BASIC METABOLIC PANEL
BUN/Creatinine Ratio: 12 (ref 12–28)
BUN: 12 mg/dL (ref 8–27)
CO2: 22 mmol/L (ref 20–29)
Calcium: 8.9 mg/dL (ref 8.7–10.3)
Chloride: 105 mmol/L (ref 96–106)
Creatinine, Ser: 0.98 mg/dL (ref 0.57–1.00)
Glucose: 105 mg/dL — ABNORMAL HIGH (ref 70–99)
Potassium: 4.1 mmol/L (ref 3.5–5.2)
Sodium: 144 mmol/L (ref 134–144)
eGFR: 61 mL/min/{1.73_m2} (ref >59)

## 2023-04-28 ENCOUNTER — Other Ambulatory Visit (HOSPITAL_BASED_OUTPATIENT_CLINIC_OR_DEPARTMENT_OTHER): Payer: Self-pay | Admitting: Family

## 2023-04-28 DIAGNOSIS — I5032 Chronic diastolic (congestive) heart failure: Secondary | ICD-10-CM

## 2023-05-04 DIAGNOSIS — R197 Diarrhea, unspecified: Secondary | ICD-10-CM | POA: Diagnosis not present

## 2023-05-12 DIAGNOSIS — E854 Organ-limited amyloidosis: Secondary | ICD-10-CM | POA: Diagnosis not present

## 2023-05-12 DIAGNOSIS — H40003 Preglaucoma, unspecified, bilateral: Secondary | ICD-10-CM | POA: Diagnosis not present

## 2023-05-12 DIAGNOSIS — H119 Unspecified disorder of conjunctiva: Secondary | ICD-10-CM | POA: Diagnosis not present

## 2023-05-12 DIAGNOSIS — H16121 Filamentary keratitis, right eye: Secondary | ICD-10-CM | POA: Diagnosis not present

## 2023-05-12 DIAGNOSIS — Z79899 Other long term (current) drug therapy: Secondary | ICD-10-CM | POA: Diagnosis not present

## 2023-05-12 DIAGNOSIS — H1189 Other specified disorders of conjunctiva: Secondary | ICD-10-CM | POA: Diagnosis not present

## 2023-05-12 DIAGNOSIS — H2513 Age-related nuclear cataract, bilateral: Secondary | ICD-10-CM | POA: Diagnosis not present

## 2023-05-15 DIAGNOSIS — R1084 Generalized abdominal pain: Secondary | ICD-10-CM | POA: Diagnosis not present

## 2023-05-15 DIAGNOSIS — C8300 Small cell B-cell lymphoma, unspecified site: Secondary | ICD-10-CM | POA: Diagnosis not present

## 2023-05-15 DIAGNOSIS — M329 Systemic lupus erythematosus, unspecified: Secondary | ICD-10-CM | POA: Diagnosis not present

## 2023-05-15 DIAGNOSIS — R197 Diarrhea, unspecified: Secondary | ICD-10-CM | POA: Diagnosis not present

## 2023-05-17 ENCOUNTER — Encounter (INDEPENDENT_AMBULATORY_CARE_PROVIDER_SITE_OTHER): Payer: Self-pay | Admitting: Otolaryngology

## 2023-05-17 ENCOUNTER — Ambulatory Visit (INDEPENDENT_AMBULATORY_CARE_PROVIDER_SITE_OTHER): Payer: Medicare Other | Admitting: Otolaryngology

## 2023-05-17 VITALS — BP 159/84 | HR 87 | Ht 66.0 in | Wt 197.0 lb

## 2023-05-17 DIAGNOSIS — K219 Gastro-esophageal reflux disease without esophagitis: Secondary | ICD-10-CM | POA: Diagnosis not present

## 2023-05-17 DIAGNOSIS — M359 Systemic involvement of connective tissue, unspecified: Secondary | ICD-10-CM | POA: Diagnosis not present

## 2023-05-17 DIAGNOSIS — K449 Diaphragmatic hernia without obstruction or gangrene: Secondary | ICD-10-CM | POA: Diagnosis not present

## 2023-05-17 DIAGNOSIS — R1314 Dysphagia, pharyngoesophageal phase: Secondary | ICD-10-CM | POA: Diagnosis not present

## 2023-05-17 DIAGNOSIS — Z8572 Personal history of non-Hodgkin lymphomas: Secondary | ICD-10-CM | POA: Diagnosis not present

## 2023-05-17 DIAGNOSIS — Z9889 Other specified postprocedural states: Secondary | ICD-10-CM

## 2023-05-17 DIAGNOSIS — Z79899 Other long term (current) drug therapy: Secondary | ICD-10-CM | POA: Diagnosis not present

## 2023-05-17 DIAGNOSIS — Z23 Encounter for immunization: Secondary | ICD-10-CM | POA: Diagnosis not present

## 2023-05-17 DIAGNOSIS — M3501 Sicca syndrome with keratoconjunctivitis: Secondary | ICD-10-CM | POA: Diagnosis not present

## 2023-05-17 DIAGNOSIS — G5600 Carpal tunnel syndrome, unspecified upper limb: Secondary | ICD-10-CM | POA: Diagnosis not present

## 2023-05-17 NOTE — Progress Notes (Signed)
ENT CONSULT:  Reason for Consult: dysphagia   HPI: Discussed the use of AI scribe software for clinical note transcription with the patient, who gave verbal consent to proceed.  History of Present Illness   The patient is a 72 yoF with a remote hx of ACDF left-sided approach, history of lymphoma treated with chemotherapy in 2010 and currently in remission, presents with intermittent dysphagia since March or April 2024 (5-6 mo). They describe a sensation of food getting stuck in the throat, leading to coughing and requiring increased fluid intake to facilitate swallowing. This occurs not only with solid foods and pills but also with water. The patient denies any unintentional weight loss. They have been on Pantoprazole for a long time for reflux, which they take once daily, but deny any active heartburn symptoms. The patient also reports frequent belching post meals and drinks. They have a known hiatal hernia but deny any episodes of food impaction. The patient has a history of smoking but quit a long time ago. They underwent a swallow study (MBS) recently, which showed flash penetration, but no aspiration. The patient also reports waking up coughing at night, which they suspect might be due to reflux. No dyspnea or significant voice changes.      Records Reviewed:  Dr Ernestene Kiel office visit  [name] is a 72 y.o. year old female with the history of lymphoma plasmacytic lymphoma in remission since 2010, followed by oncology last seen December 13, 2022. She presents today due to complaints of dysphagia.  She experiences choking and coughing during meals, regardless of the type of food or drink consumed. Occasionally, she feels as if food is lodged in her chest, but this sensation subsides after drinking water. She does not regurgitate undigested food or vomit post-meals. Swallowing is not painful for her. Recently, she felt a knot-like sensation in the middle of her throat while drinking water, which she  attributes to possibly drinking too fast. Her symptoms are not constant; sometimes, she can eat without any issues, but at other times, she starts coughing immediately after eating, which then subsides.  These symptoms have been present since May 2024, with no other health changes noted around that time. She has not had any instances of pneumonia in the past year. She reports no changes in her voice or shortness of breath. She has not undergone a swallow study.  She has a history of smoking but quit many years ago. She does not consume alcohol. She experiences heartburn and acid reflux intermittently and is on medication for the same. She reports no facial numbness. She has a history of ACDF surgery years ago, left sided approach.  Despite her difficulty swallowing, she has gained weight.    MPRESSION: 72 year old female with a history of distant anterior cervical discectomy and fusion who presents with a 30-month history of recurring choking episodes and dysphagia of the esophagus. 1. Esophageal dysphagia  2. Choking, subsequent encounter  Past Medical History:  Diagnosis Date   Acid reflux    Arthritis    Asthma    Carotid stenosis 10/29/2020   Chronic diastolic heart failure (HCC) 08/30/2019   Gait instability 10/29/2020   Hypertension    Lupus 07/13/2011   Malignant lymphoma, 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   Near syncope 08/30/2019   PONV (postoperative nausea and vomiting)    Pure hypercholesterolemia 05/31/2022   Stroke (HCC)    lt side weakness    Past Surgical History:  Procedure Laterality Date   ABDOMINAL HYSTERECTOMY  BACK SURGERY     CHOLECYSTECTOMY     COLONOSCOPY     EYE SURGERY Right 08/31/2022   FOOT SURGERY     x2   JOINT REPLACEMENT Bilateral 2023   Hip replacements March 2023 - left and Sept. 2022 - right   MASS EXCISION     back   rortator     rotator cuff   TOTAL SHOULDER ARTHROPLASTY Left 08/16/2018   Procedure: LEFT SHOULDER REVERSE TOTAL  SHOULDER ARTHROPLASTY AND BICEPS TENOTOMY;  Surgeon: Jones Broom, MD;  Location: [facility] OR;  Service: Orthopedics;  Laterality: Left;    Family History  Problem Relation Age of Onset   Lung disease Father    Coronary artery disease Brother    Breast cancer Neg Hx     Social History:  reports that she quit smoking about 28 years ago. Her smoking use included cigarettes. She started smoking about 38 years ago. She has a 10 pack-year smoking history. She has been exposed to tobacco smoke. She has never used smokeless tobacco. She reports that she does not currently use alcohol. She reports that she does not use drugs.  Allergies:  Allergies  Allergen Reactions   Iodinated Contrast Media Other (See Comments)    Kidney failure.    Ciprofloxacin Itching and Rash   E-Mycin [Erythromycin Base] Nausea And Vomiting   Oxycodone Other (See Comments)    Hallucination.    Medications: I have reviewed the patient's current medications.  The PMH, PSH, Medications, Allergies, and SH were reviewed and updated.  ROS: Constitutional: Negative for fever, weight loss and weight gain. Cardiovascular: Negative for chest pain and dyspnea on exertion. Respiratory: Is not experiencing shortness of breath at rest. Gastrointestinal: Negative for nausea and vomiting. Neurological: Negative for headaches. Psychiatric: The patient is not nervous/anxious  Blood pressure (!) 159/84, pulse 87, height 5\' 6"  (1.676 m), weight 197 lb (89.4 kg), SpO2 97%.  PHYSICAL EXAM:  Exam: General: Well-developed, well-nourished Communication and Voice: Clear pitch and clarity Respiratory Respiratory effort: Equal inspiration and expiration without stridor Cardiovascular Peripheral Vascular: Warm extremities with equal color/perfusion Eyes: No nystagmus with equal extraocular motion bilaterally Neuro/Psych/Balance: Patient oriented to person, place, and time; Appropriate mood and affect; Gait is intact with no  imbalance; Cranial nerves I-XII are intact Head and Face Inspection: Normocephalic and atraumatic without mass or lesion Palpation: Facial skeleton intact without bony stepoffs Salivary Glands: No mass or tenderness Facial Strength: Facial motility symmetric and full bilaterally ENT Pinna: External ear intact and fully developed External canal: Canal is patent with intact skin Tympanic Membrane: Clear and mobile External Nose: No scar or anatomic deformity Internal Nose: Septum is deviated to the left. No polyp, or purulence. Mucosal edema and erythema present.  Bilateral inferior turbinate hypertrophy.  Lips, Teeth, and gums: Mucosa and teeth intact and viable TMJ: No pain to palpation with full mobility Oral cavity/oropharynx: No erythema or exudate, no lesions present Nasopharynx: No mass or lesion with intact mucosa Hypopharynx: Intact mucosa without pooling of secretions Larynx Glottic: Full true vocal cord mobility without lesion or mass Supraglottic: Normal appearing epiglottis and AE folds Interarytenoid Space: Moderate pachydermia edema Subglottic Space: Patent without lesion or edema Neck Neck and Trachea: Midline trachea without mass or lesion Thyroid: No mass or nodularity Lymphatics: No lymphadenopathy  Procedure:  Preoperative diagnosis: dysphagia  Postoperative diagnosis:   Same + GERD LPR  Procedure: Flexible fiberoptic laryngoscopy  Surgeon: Ashok Croon, MD  Anesthesia: Topical lidocaine and Afrin Complications: None Condition is  stable throughout exam  Indications and consent:  The patient presents to the clinic with Indirect laryngoscopy view was incomplete. Thus it was recommended that they undergo a flexible fiberoptic laryngoscopy. All of the risks, benefits, and potential complications were reviewed with the patient preoperatively and verbal informed consent was obtained.  Procedure: The patient was seated upright in the clinic. Topical  lidocaine and Afrin were applied to the nasal cavity. After adequate anesthesia had occurred, I then proceeded to pass the flexible telescope into the nasal cavity. The nasal cavity was patent without rhinorrhea or polyp. The nasopharynx was also patent without mass or lesion. The base of tongue was visualized and was normal. There were no signs of pooling of secretions in the piriform sinuses. The true vocal folds were mobile bilaterally. There were no signs of glottic or supraglottic mucosal lesion or mass. There was moderate interarytenoid pachydermia and post cricoid edema. The telescope was then slowly withdrawn and the patient tolerated the procedure throughout.  Studies Reviewed: MBS 03/21/23 EXAM: MODIFIED BARIUM SWALLOW   TECHNIQUE: Radiologist, not in attendance for the exam.   Different consistencies of barium were administered orally to the patient by the Speech Pathologist. Imaging of the pharynx was performed in the lateral projection. The radiologist was present in the fluoroscopy room for this study, providing personal supervision.   FLUOROSCOPY: Radiation Exposure Index (as provided by the fluoroscopic device): 44.85 mGy Kerma   COMPARISON:  None Available.   FINDINGS: Vestibular Penetration: Flash penetration noted without frank aspiration.   Aspiration:  None seen.   Other:  None.   IMPRESSION: *Flash vestibular penetration without frank aspiration.  MBS interpretation by SLP Pt presents with a likely esophageal dysphagia marked by esophageal retention with retrograde flow below pharyngoesophageal segment (PES). Oropharyngeal swallow was [facility] with normal oral function, onset of pharyngeal swallow at level of pyriforms, occasional high penetration of thin liquids (Penetration/Aspiration score of 2 - material ejected from larynx upon completion of swallow; considered [facility]). Partway through the study, [name] began belching and coughing - scan of the esophagus revealed  barium retention and retrograde flow. Results/recommendations reviewed with pt. Recommend esophagram. Handout with recs specific to esophageal deficits was provided.   Dx pharyngoesphageal   Assessment/Plan: Encounter Diagnoses  Name Primary?   Pharyngoesophageal dysphagia Yes   Chronic GERD    History of neck surgery     Assessment and Plan    Dysphagia - based on MBS esophageal dysphagia suspected Intermittent dysphagia since March/April with episodes of food getting stuck requiring significant effort to clear. Swallow study showed flash penetration without airway entry. MBS with intact oropharyngeal swallow. No GI evaluation or esophageal dilation history. No unintentional weight loss or active heartburn despite reflux history and pantoprazole use. Examination noted findings consistent with reflux changes, no pooling of secretions in pyriforms. Potential of esophageal strictures or hiatal hernia as a cause of esophageal dysphagia and reflux sx discussed. Esophagram to focus on esophageal structural issues. - Order esophagram to evaluate esophageal function and structure - Continue pantoprazole 40 mg daily - Recommend Reflux Gourmet supplement post-meals, especially after dinner - Continue diet and lifestyle modifications for reflux management  Gastroesophageal Reflux Disease (GERD) Reflux managed with pantoprazole. Symptoms include postprandial belching and nocturnal coughing, likely due to reflux. Examination showed laryngeal findings consistent with reflux changes. No active heartburn. Pantoprazole reduces stomach acid but does not prevent reflux; other stomach contents can cause irritation. Reflux Gourmet discussed to form a barrier and prevent nocturnal reflux. - Continue  pantoprazole 40 mg daily - Recommend Reflux Gourmet supplement post-meals, especially after dinner - Continue diet and lifestyle modifications for reflux management  Hiatal Hernia Known hiatal hernia potentially  contributing to reflux symptoms. Further evaluation pending esophagram results, we discussed that significant hernia and severe reflux may warrant surgical intervention. - Evaluate for hiatal hernia during esophagram - Consider further management based on esophagram results  Lymphoma (Remission) Lymphoma treated with chemotherapy, completed in 2010. Currently in remission. Recent bone marrow biopsy to check for recurrence. - Monitor for signs of recurrence   Follow-up - follow-up appointment after esophagram results are available.        Thank you for allowing me to participate in the care of this patient. Please do not hesitate to contact me with any questions or concerns.   Ashok Croon, MD Otolaryngology [facility] Phone: [phone] Fax: [phone]    05/17/2023, 5:55 PM

## 2023-05-17 NOTE — Patient Instructions (Signed)
-   schedule esophagram - start reflux gourmet in addition to Protonix  - Take Reflux Gourmet (natural supplement available on [employer]) to help with symptoms of chronic throat irritation

## 2023-05-18 DIAGNOSIS — M47816 Spondylosis without myelopathy or radiculopathy, lumbar region: Secondary | ICD-10-CM | POA: Diagnosis not present

## 2023-05-29 DIAGNOSIS — M329 Systemic lupus erythematosus, unspecified: Secondary | ICD-10-CM | POA: Diagnosis not present

## 2023-05-29 DIAGNOSIS — R197 Diarrhea, unspecified: Secondary | ICD-10-CM | POA: Diagnosis not present

## 2023-05-29 DIAGNOSIS — M3501 Sicca syndrome with keratoconjunctivitis: Secondary | ICD-10-CM | POA: Diagnosis not present

## 2023-05-29 DIAGNOSIS — C8300 Small cell B-cell lymphoma, unspecified site: Secondary | ICD-10-CM | POA: Diagnosis not present

## 2023-06-13 DIAGNOSIS — I5032 Chronic diastolic (congestive) heart failure: Secondary | ICD-10-CM | POA: Diagnosis not present

## 2023-06-13 DIAGNOSIS — R7303 Prediabetes: Secondary | ICD-10-CM | POA: Diagnosis not present

## 2023-06-13 DIAGNOSIS — I130 Hypertensive heart and chronic kidney disease with heart failure and stage 1 through stage 4 chronic kidney disease, or unspecified chronic kidney disease: Secondary | ICD-10-CM | POA: Diagnosis not present

## 2023-06-13 DIAGNOSIS — N182 Chronic kidney disease, stage 2 (mild): Secondary | ICD-10-CM | POA: Diagnosis not present

## 2023-06-14 DIAGNOSIS — I5032 Chronic diastolic (congestive) heart failure: Secondary | ICD-10-CM | POA: Diagnosis not present

## 2023-06-15 ENCOUNTER — Encounter: Payer: Self-pay | Admitting: Hematology & Oncology

## 2023-06-15 ENCOUNTER — Inpatient Hospital Stay: Payer: Medicare Other | Admitting: Hematology & Oncology

## 2023-06-15 ENCOUNTER — Inpatient Hospital Stay: Payer: Medicare Other | Attending: Hematology & Oncology

## 2023-06-15 VITALS — BP 136/53 | HR 85 | Temp 98.6°F | Resp 20 | Ht 66.0 in | Wt 199.0 lb

## 2023-06-15 DIAGNOSIS — Z79899 Other long term (current) drug therapy: Secondary | ICD-10-CM | POA: Insufficient documentation

## 2023-06-15 DIAGNOSIS — Z8572 Personal history of non-Hodgkin lymphomas: Secondary | ICD-10-CM | POA: Insufficient documentation

## 2023-06-15 DIAGNOSIS — E8589 Other amyloidosis: Secondary | ICD-10-CM | POA: Diagnosis not present

## 2023-06-15 DIAGNOSIS — L930 Discoid lupus erythematosus: Secondary | ICD-10-CM

## 2023-06-15 DIAGNOSIS — Z7982 Long term (current) use of aspirin: Secondary | ICD-10-CM | POA: Insufficient documentation

## 2023-06-15 DIAGNOSIS — C8300 Small cell B-cell lymphoma, unspecified site: Secondary | ICD-10-CM

## 2023-06-15 LAB — CBC WITH DIFFERENTIAL (CANCER CENTER ONLY)
Abs Immature Granulocytes: 0.01 10*3/uL (ref 0.00–0.07)
Basophils Absolute: 0 10*3/uL (ref 0.0–0.1)
Basophils Relative: 1 %
Eosinophils Absolute: 0.1 10*3/uL (ref 0.0–0.5)
Eosinophils Relative: 1 %
HCT: 35.4 % — ABNORMAL LOW (ref 36.0–46.0)
Hemoglobin: 11.3 g/dL — ABNORMAL LOW (ref 12.0–15.0)
Immature Granulocytes: 0 %
Lymphocytes Relative: 31 %
Lymphs Abs: 2.1 10*3/uL (ref 0.7–4.0)
MCH: 31 pg (ref 26.0–34.0)
MCHC: 31.9 g/dL (ref 30.0–36.0)
MCV: 97 fL (ref 80.0–100.0)
Monocytes Absolute: 0.5 10*3/uL (ref 0.1–1.0)
Monocytes Relative: 8 %
Neutro Abs: 3.9 10*3/uL (ref 1.7–7.7)
Neutrophils Relative %: 59 %
Platelet Count: 147 10*3/uL — ABNORMAL LOW (ref 150–400)
RBC: 3.65 MIL/uL — ABNORMAL LOW (ref 3.87–5.11)
RDW: 14.3 % (ref 11.5–15.5)
WBC Count: 6.5 10*3/uL (ref 4.0–10.5)
nRBC: 0 % (ref 0.0–0.2)

## 2023-06-15 LAB — CMP (CANCER CENTER ONLY)
ALT: 12 U/L (ref 0–44)
AST: 17 U/L (ref 15–41)
Albumin: 4 g/dL (ref 3.5–5.0)
Alkaline Phosphatase: 128 U/L — ABNORMAL HIGH (ref 38–126)
Anion gap: 8 (ref 5–15)
BUN: 15 mg/dL (ref 8–23)
CO2: 28 mmol/L (ref 22–32)
Calcium: 9.3 mg/dL (ref 8.9–10.3)
Chloride: 106 mmol/L (ref 98–111)
Creatinine: 0.9 mg/dL (ref 0.44–1.00)
GFR, Estimated: >60 mL/min (ref >60)
Glucose, Bld: 102 mg/dL — ABNORMAL HIGH (ref 70–99)
Potassium: 3.6 mmol/L (ref 3.5–5.1)
Sodium: 142 mmol/L (ref 135–145)
Total Bilirubin: 0.6 mg/dL (ref <1.2)
Total Protein: 6.8 g/dL (ref 6.5–8.1)

## 2023-06-15 LAB — LACTATE DEHYDROGENASE: LDH: 165 U/L (ref 98–192)

## 2023-06-15 NOTE — Progress Notes (Signed)
Hematology and Oncology Follow Up Visit  [name] [id number] 1951-03-03 72 y.o. 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 is back for her follow-up.  +She is doing okay.  She did have surgery for the right eye.  Everything seemed to go fairly well with this.  She said that there is still a little bit of irritation with the right eye.  Otherwise, she has had no problems with her back.  She actually drove to [place] to see her grandson graduate high school.  She was happy and proud that she could drive that far.  There has been no issues with nausea or vomiting.  She has had no change in bowel or bladder habits.  She has had no cough or shortness of breath.  She has had no problems with bleeding.  Overall, I would have said that her performance status is probably ECOG 1.   She has had no fever.  She has had no change in bowel or bladder habits.  There has been no cough or shortness of breath.  She has had no nausea or vomiting.  Overall, I would say that her performance status is probably ECOG 1.  Medications:  Current Outpatient Medications:  .  acetaminophen (TYLENOL) 500 MG tablet, Take 1,000 mg by mouth every 6 (six) hours as needed for mild pain or headache., Disp: , Rfl:  .  albuterol (VENTOLIN HFA) 108 (90 Base) MCG/ACT inhaler, Inhale 2 puffs into the lungs every 6 (six) hours as needed for wheezing or shortness of breath., Disp: 8 g, Rfl: 6 .  aspirin 81 MG chewable tablet, Chew by mouth daily., Disp: , Rfl:  .  cholecalciferol (VITAMIN D) 1000 UNITS tablet, Take 1,000 Units by mouth daily. , Disp: , Rfl:  .  fluticasone-salmeterol (ADVAIR HFA) 230-21 MCG/ACT inhaler, Inhale 2 puffs into the lungs 2 (two) times daily., Disp: 1 each, Rfl: 5 .  hydroxychloroquine (PLAQUENIL) 200 MG tablet, Take 200 mg by mouth daily., Disp: , Rfl:  .   latanoprost (XALATAN) 0.005 % ophthalmic solution, Place 1 drop into both eyes at bedtime., Disp: , Rfl:  .  losartan (COZAAR) 50 MG tablet, Take 1 tablet (50 mg total) by mouth daily., Disp: 90 tablet, Rfl: 3 .  pantoprazole (PROTONIX) 40 MG tablet, Take 40 mg by mouth daily., Disp: , Rfl:  .  potassium chloride SA (KLOR-CON M) 20 MEQ tablet, Take 1 tablet (20 mEq total) by mouth 2 (two) times daily. 2 in the morning and 1 in the evening (Patient taking differently: Take 20 mEq by mouth 2 (two) times daily. 06/15/2023 1 in the morning and 1 in the evening), Disp: 60 tablet, Rfl: 11 .  rosuvastatin (CRESTOR) 20 MG tablet, Take 1 tablet (20 mg total) by mouth daily., Disp: 30 tablet, Rfl: 11 .  Spacer/Aero-Holding Chambers (AEROCHAMBER MV) inhaler, Use as instructed, Disp: 1 each, Rfl: 0 .  spironolactone (ALDACTONE) 25 MG tablet, Take 0.5 tablets (12.5 mg total) by mouth daily., Disp: 30 tablet, Rfl: 11 .  torsemide (DEMADEX) 20 MG tablet, Take 3 tablets by mouth once daily, Disp: 90 tablet, Rfl: 3 .  Turmeric 500 MG CAPS, Take by mouth daily., Disp: , Rfl:  .  valACYclovir (VALTREX) 1000 MG tablet, Take 1,000 mg by mouth 2 (two) times daily as needed., Disp: , Rfl:   Current Facility-Administered Medications:  .  lidocaine-EPINEPHrine (XYLOCAINE W/EPI) 1 %-1:100000 (with pres) injection 10 mL, 10 mL, Infiltration, Once, Croitoru, Mihai, MD .  lidocaine-EPINEPHrine (XYLOCAINE W/EPI) 1 %-1:100000 (with pres) injection 15 mL, 15 mL, Infiltration, Once, Croitoru, Mihai, MD  Allergies:  Allergies  Allergen Reactions  . Iodinated Contrast Media Other (See Comments)    Kidney failure.   . Ciprofloxacin Itching and Rash  . E-Mycin [Erythromycin Base] Nausea And Vomiting  . Oxycodone Other (See Comments)    Hallucination.    Past Medical History, Surgical history, Social history, and Family History were reviewed and updated.  Review of Systems: Review of Systems  Constitutional: Negative.    HENT:  Negative.    Eyes:  Positive for eye problems.  Respiratory: Negative.    Cardiovascular: Negative.   Gastrointestinal: Negative.   Endocrine: Negative.   Genitourinary: Negative.    Musculoskeletal:  Positive for back pain.  Neurological: Negative.   Hematological: Negative.   Psychiatric/Behavioral: Negative.      Physical Exam:  height is 5\' 6"  (1.676 m) and weight is 199 lb (90.3 kg). Her oral temperature is 98.6 F (37 C). Her blood pressure is 136/53 (abnormal) and her pulse is 85. Her respiration is 20 and oxygen saturation is 97%.   Wt Readings from Last 3 Encounters:  06/15/23 199 lb (90.3 kg)  05/17/23 197 lb (89.4 kg)  04/11/23 203 lb 14.4 oz (92.5 kg)    Physical Exam Vitals reviewed.  HENT:     Head: Normocephalic and atraumatic.  Eyes:     Pupils: Pupils are equal, round, and reactive to light.     Comments: She has some slight ptosis of the right eye.  Her extraocular muscles are intact.  Cardiovascular:     Rate and Rhythm: Normal rate and regular rhythm.     Heart sounds: Normal heart sounds.  Pulmonary:     Effort: Pulmonary effort is normal.     Breath sounds: Normal breath sounds.  Abdominal:     General: Bowel sounds are normal.     Palpations: Abdomen is soft.  Musculoskeletal:        General: No tenderness or deformity. Normal range of motion.     Cervical back: Normal range of motion.  Lymphadenopathy:     Cervical: No cervical adenopathy.  Skin:    General: Skin is warm and dry.     Findings: No erythema or rash.  Neurological:     Mental Status: She is alert and oriented to person, place, and time.  Psychiatric:        Behavior: Behavior normal.        Thought Content: Thought content normal.        Judgment: Judgment normal.     Lab Results  Component Value Date   WBC 6.5 06/15/2023   HGB 11.3 (L) 06/15/2023   HCT 35.4 (L) 06/15/2023   MCV 97.0 06/15/2023   PLT 147 (L) 06/15/2023     Chemistry      Component Value  Date/Time   NA 142 06/15/2023 1151   NA 144 04/25/2023 1052   NA 142 05/18/2015 1257   K 3.6 06/15/2023 1151   K 3.6 05/18/2015 1257   CL 106 06/15/2023 1151   CL 104 01/15/2013 0844   CO2 28 06/15/2023 1151   CO2 26 05/18/2015 1257   BUN 15 06/15/2023 1151   BUN 12 04/25/2023 1052   BUN 12.6 05/18/2015 1257   CREATININE 0.90 06/15/2023 1151   CREATININE 0.8 05/18/2015 1257  Component Value Date/Time   CALCIUM 9.3 06/15/2023 1151   CALCIUM 9.1 05/18/2015 1257   ALKPHOS 128 (H) 06/15/2023 1151   ALKPHOS 122 05/18/2015 1257   AST 17 06/15/2023 1151   AST 17 05/18/2015 1257   ALT 12 06/15/2023 1151   ALT 13 05/18/2015 1257   BILITOT 0.6 06/15/2023 1151   BILITOT 0.72 05/18/2015 1257      Impression and Plan: Ms. Gieseking is a very nice 72 year old African-American female.  We had seen her in the remote past with a lymphoplasmacytic lymphoma.  She was treated for this back in March 2010.  She has been in remission since.  Again, I cannot find any malignancy that would account for the ocular amyloidosis.  At this point, I think we can just plan to get her back in 6 months.  I think if there is an issue with respect to this amyloid, I do not know if radiation might be a therapy since this is some that is localized and not systemic.     Josph Macho, MD 12/12/20241:17 PM

## 2023-06-16 LAB — KAPPA/LAMBDA LIGHT CHAINS
Kappa free light chain: 10.8 mg/L (ref 3.3–19.4)
Kappa, lambda light chain ratio: 0.74 (ref 0.26–1.65)
Lambda free light chains: 14.5 mg/L (ref 5.7–26.3)

## 2023-06-17 LAB — IGG, IGA, IGM
IgA: 44 mg/dL — ABNORMAL LOW (ref 64–422)
IgG (Immunoglobin G), Serum: 798 mg/dL (ref 586–1602)
IgM (Immunoglobulin M), Srm: <5 mg/dL — ABNORMAL LOW (ref 26–217)

## 2023-06-20 DIAGNOSIS — M329 Systemic lupus erythematosus, unspecified: Secondary | ICD-10-CM | POA: Diagnosis not present

## 2023-06-20 DIAGNOSIS — Z23 Encounter for immunization: Secondary | ICD-10-CM | POA: Diagnosis not present

## 2023-06-20 DIAGNOSIS — G609 Hereditary and idiopathic neuropathy, unspecified: Secondary | ICD-10-CM | POA: Diagnosis not present

## 2023-06-20 DIAGNOSIS — C8300 Small cell B-cell lymphoma, unspecified site: Secondary | ICD-10-CM | POA: Diagnosis not present

## 2023-06-20 DIAGNOSIS — R296 Repeated falls: Secondary | ICD-10-CM | POA: Diagnosis not present

## 2023-06-23 LAB — PROTEIN ELECTROPHORESIS, SERUM, WITH REFLEX
A/G Ratio: 1.3 (ref 0.7–1.7)
Albumin ELP: 3.6 g/dL (ref 2.9–4.4)
Alpha-1-Globulin: 0.3 g/dL (ref 0.0–0.4)
Alpha-2-Globulin: 1 g/dL (ref 0.4–1.0)
Beta Globulin: 0.9 g/dL (ref 0.7–1.3)
Gamma Globulin: 0.7 g/dL (ref 0.4–1.8)
Globulin, Total: 2.8 g/dL (ref 2.2–3.9)
M-Spike, %: 0.3 g/dL — ABNORMAL HIGH
SPEP Interpretation: 0
Total Protein ELP: 6.4 g/dL (ref 6.0–8.5)

## 2023-06-23 LAB — IMMUNOFIXATION REFLEX, SERUM
IgA: 50 mg/dL — ABNORMAL LOW (ref 64–422)
IgG (Immunoglobin G), Serum: 874 mg/dL (ref 586–1602)
IgM (Immunoglobulin M), Srm: <5 mg/dL — ABNORMAL LOW (ref 26–217)

## 2023-06-26 ENCOUNTER — Ambulatory Visit (HOSPITAL_COMMUNITY)
Admission: RE | Admit: 2023-06-26 | Discharge: 2023-06-26 | Disposition: A | Discharge status: Home / Self Care | Payer: Medicare Other | Source: Ambulatory Visit | Attending: Otolaryngology | Admitting: Otolaryngology

## 2023-06-26 DIAGNOSIS — R1314 Dysphagia, pharyngoesophageal phase: Secondary | ICD-10-CM | POA: Diagnosis not present

## 2023-06-26 DIAGNOSIS — K224 Dyskinesia of esophagus: Secondary | ICD-10-CM | POA: Diagnosis not present

## 2023-07-06 DIAGNOSIS — Z9181 History of falling: Secondary | ICD-10-CM | POA: Diagnosis not present

## 2023-07-17 ENCOUNTER — Ambulatory Visit (INDEPENDENT_AMBULATORY_CARE_PROVIDER_SITE_OTHER): Payer: Medicare Other | Admitting: Otolaryngology

## 2023-07-21 DIAGNOSIS — H2513 Age-related nuclear cataract, bilateral: Secondary | ICD-10-CM | POA: Insufficient documentation

## 2023-07-21 DIAGNOSIS — Z79899 Other long term (current) drug therapy: Secondary | ICD-10-CM | POA: Diagnosis not present

## 2023-07-21 DIAGNOSIS — H16121 Filamentary keratitis, right eye: Secondary | ICD-10-CM | POA: Diagnosis not present

## 2023-07-21 DIAGNOSIS — H40003 Preglaucoma, unspecified, bilateral: Secondary | ICD-10-CM | POA: Diagnosis not present

## 2023-07-21 DIAGNOSIS — H119 Unspecified disorder of conjunctiva: Secondary | ICD-10-CM | POA: Diagnosis not present

## 2023-07-21 DIAGNOSIS — E854 Organ-limited amyloidosis: Secondary | ICD-10-CM | POA: Diagnosis not present

## 2023-07-21 DIAGNOSIS — H16229 Keratoconjunctivitis sicca, not specified as Sjogren's, unspecified eye: Secondary | ICD-10-CM | POA: Diagnosis not present

## 2023-07-21 DIAGNOSIS — H1132 Conjunctival hemorrhage, left eye: Secondary | ICD-10-CM | POA: Diagnosis not present

## 2023-07-21 DIAGNOSIS — H1189 Other specified disorders of conjunctiva: Secondary | ICD-10-CM | POA: Diagnosis not present

## 2023-07-24 ENCOUNTER — Ambulatory Visit (INDEPENDENT_AMBULATORY_CARE_PROVIDER_SITE_OTHER): Payer: Medicare Other | Admitting: Otolaryngology

## 2023-07-24 ENCOUNTER — Encounter (INDEPENDENT_AMBULATORY_CARE_PROVIDER_SITE_OTHER): Payer: Self-pay | Admitting: Otolaryngology

## 2023-07-24 VITALS — BP 129/6 | HR 67

## 2023-07-24 DIAGNOSIS — Z9889 Other specified postprocedural states: Secondary | ICD-10-CM

## 2023-07-24 DIAGNOSIS — K224 Dyskinesia of esophagus: Secondary | ICD-10-CM

## 2023-07-24 DIAGNOSIS — K219 Gastro-esophageal reflux disease without esophagitis: Secondary | ICD-10-CM | POA: Diagnosis not present

## 2023-07-24 DIAGNOSIS — R1319 Other dysphagia: Secondary | ICD-10-CM

## 2023-07-24 NOTE — Progress Notes (Signed)
ENT Progress Note:  Update 07/24/23 Discussed the use of AI scribe software for clinical note transcription with the patient, who gave verbal consent to proceed.  History of Present Illness   The patient returns after esophagram which showed esophageal dysmotility, no hiatal hernia. She still has a sensation of food getting stuck during swallowing. This sensation is consistent and has been ongoing. The patient has been managing this condition with reflux medication and reflux gourmet, although the latter has been challenging to swallow due to its thick consistency. The esophagram did not reveal any evidence of stricture in the esophagus or hiatal hernia. Despite these challenges, the patient continues to maintain a regular diet.     Initial Consult 05/17/23 Reason for Consult: dysphagia   HPI: Discussed the use of AI scribe software for clinical note transcription with the patient, who gave verbal consent to proceed.  History of Present Illness   The patient is [name] with a remote hx of ACDF left-sided approach, history of lymphoma treated with chemotherapy in 2010 and currently in remission, presents with intermittent dysphagia since March or April 2024 (5-6 mo). They describe a sensation of food getting stuck in the throat, leading to coughing and requiring increased fluid intake to facilitate swallowing. This occurs not only with solid foods and pills but also with water. The patient denies any unintentional weight loss. They have been on Pantoprazole for a long time for reflux, which they take once daily, but deny any active heartburn symptoms. The patient also reports frequent belching post meals and drinks. They have a known hiatal hernia but deny any episodes of food impaction. The patient has a history of smoking but quit a long time ago. They underwent a swallow study (MBS) recently, which showed flash penetration, but no aspiration. The patient also reports waking up coughing at night,  which they suspect might be due to reflux. No dyspnea or significant voice changes.      Records Reviewed:  Dr Ernestene Kiel office visit  [name] is a 73 y.o. year old female with the history of lymphoma plasmacytic lymphoma in remission since 2010, followed by oncology last seen December 13, 2022. She presents today due to complaints of dysphagia.  She experiences choking and coughing during meals, regardless of the type of food or drink consumed. Occasionally, she feels as if food is lodged in her chest, but this sensation subsides after drinking water. She does not regurgitate undigested food or vomit post-meals. Swallowing is not painful for her. Recently, she felt a knot-like sensation in the middle of her throat while drinking water, which she attributes to possibly drinking too fast. Her symptoms are not constant; sometimes, she can eat without any issues, but at other times, she starts coughing immediately after eating, which then subsides.  These symptoms have been present since May 2024, with no other health changes noted around that time. She has not had any instances of pneumonia in the past year. She reports no changes in her voice or shortness of breath. She has not undergone a swallow study.  She has a history of smoking but quit many years ago. She does not consume alcohol. She experiences heartburn and acid reflux intermittently and is on medication for the same. She reports no facial numbness. She has a history of ACDF surgery years ago, left sided approach.  Despite her difficulty swallowing, she has gained weight.    MPRESSION: 73 year old female with a history of distant anterior cervical discectomy and fusion  who presents with a 75-year history of recurring choking episodes and dysphagia of the esophagus. 1. Esophageal dysphagia  2. 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 2022 - right   MASS EXCISION     back   rortator     rotator cuff   TOTAL SHOULDER ARTHROPLASTY Left 08/16/2018   Procedure: LEFT SHOULDER REVERSE TOTAL SHOULDER ARTHROPLASTY AND BICEPS TENOTOMY;  Surgeon: Jones Broom, MD;  Location: [facility] OR;  Service: Orthopedics;  Laterality: Left;    Family History  Problem Relation Age of Onset   Lung disease Father    Coronary artery disease Brother    Breast cancer Neg Hx     Social History:  reports that she quit smoking about 29 years ago. Her smoking use included cigarettes. She started smoking about 39 years ago. She has a 10 pack-year smoking history. She has been exposed to tobacco smoke. She has never used smokeless tobacco. She reports that she does not currently use alcohol. She reports that she does not use drugs.  Allergies:  Allergies  Allergen Reactions   Iodinated Contrast Media Other (See Comments)    Kidney failure.    Ciprofloxacin Itching and Rash   E-Mycin [Erythromycin Base] Nausea And Vomiting   Oxycodone Other (See Comments)    Hallucination.    Medications: I have reviewed the patient's current medications.  The PMH, PSH, Medications, Allergies, and SH were reviewed and updated.  ROS: Constitutional: Negative for fever, weight loss and weight gain. Cardiovascular: Negative for chest pain and dyspnea on  exertion. Respiratory: Is not experiencing shortness of breath at rest. Gastrointestinal: Negative for nausea and vomiting. Neurological: Negative for headaches. Psychiatric: The patient is not nervous/anxious  Blood pressure (!) 129/6, pulse 67, SpO2 100%.  PHYSICAL EXAM:  Exam: General: Well-developed, well-nourished Respiratory Respiratory effort: Equal inspiration and expiration without stridor Cardiovascular Peripheral Vascular: Warm extremities with equal color/perfusion Eyes: No nystagmus with equal extraocular motion bilaterally Neuro/Psych/Balance: Patient oriented to person, place, and time; Appropriate mood and affect; Gait is intact with no imbalance; Cranial nerves I-XII are intact Head and Face Inspection: Normocephalic and atraumatic without mass or lesion Facial Strength: Facial motility symmetric and full bilaterally ENT Pinna: External ear intact and fully developed External canal: Canal is patent with intact skin External Nose: No scar or anatomic deformity  Studies Reviewed: MBS 03/21/23 EXAM: MODIFIED BARIUM SWALLOW   TECHNIQUE: Radiologist, not in attendance for the exam.   Different consistencies of barium were administered orally to the patient by the Speech Pathologist. Imaging of the pharynx was performed in the lateral projection. The radiologist was present in the fluoroscopy room for this study, providing personal supervision.  FLUOROSCOPY: Radiation Exposure Index (as provided by the fluoroscopic device): 44.85 mGy Kerma   COMPARISON:  None Available.   FINDINGS: Vestibular Penetration: Flash penetration noted without frank aspiration.   Aspiration:  None seen.   Other:  None.   IMPRESSION: *Flash vestibular penetration without frank aspiration.  MBS interpretation by SLP Pt presents with a likely esophageal dysphagia marked by esophageal retention with retrograde flow below pharyngoesophageal segment (PES). Oropharyngeal swallow  was [facility] with normal oral function, onset of pharyngeal swallow at level of pyriforms, occasional high penetration of thin liquids (Penetration/Aspiration score of 2 - material ejected from larynx upon completion of swallow; considered [facility]). Partway through the study, [name] began belching and coughing - scan of the esophagus revealed barium retention and retrograde flow. Results/recommendations reviewed with pt. Recommend esophagram. Handout with recs specific to esophageal deficits was provided.   Dx pharyngoesphageal   Esophagram 06/26/23 FINDINGS: Swallowing: Appears normal. No vestibular penetration or aspiration seen.   Pharynx: Unremarkable.   Esophagus: Normal appearance. No mucosal lesions or strictures identified.   Esophageal motility: Evidence of dysmotility with incomplete clearing of the esophagus and presence of tertiary contractions.   Hiatal Hernia: No definitive hiatal hernia seen.   Gastroesophageal reflux: None demonstrated, even with provocative maneuvers such as cough and as water siphon test.   Ingested 13 mm barium tablet: Passed normally   Other: None.   IMPRESSION: Esophagus without mucosal lesions or strictures.   Evidence of dysmotility with incomplete clearing of the esophagus and presence of tertiary contractions. Contrast pooling present in the proximal esophagus, likely correlating with the patient's symptoms.   No definitive evidence of hiatal hernia, no evidence of gastroesophageal reflux.    Assessment/Plan: Encounter Diagnoses  Name Primary?   History of neck surgery Yes   Chronic GERD    Esophageal dysmotility    Esophageal dysphagia      Assessment and Plan    Dysphagia - based on MBS esophageal dysphagia suspected Intermittent dysphagia since March/April with episodes of food getting stuck requiring significant effort to clear. Swallow study showed flash penetration without airway entry. MBS with intact oropharyngeal swallow.  No GI evaluation or esophageal dilation history. No unintentional weight loss or active heartburn despite reflux history and pantoprazole use. Examination noted findings consistent with reflux changes, no pooling of secretions in pyriforms. Potential of esophageal strictures or hiatal hernia as a cause of esophageal dysphagia and reflux sx discussed. Esophagram to focus on esophageal structural issues. - Order esophagram to evaluate esophageal function and structure - Continue pantoprazole 40 mg daily - Recommend Reflux Gourmet supplement post-meals, especially after dinner - Continue diet and lifestyle modifications for reflux management  Gastroesophageal Reflux Disease (GERD) Reflux managed with pantoprazole. Symptoms include postprandial belching and nocturnal coughing, likely due to reflux. Examination showed laryngeal findings consistent with reflux changes. No active heartburn. Pantoprazole reduces stomach acid but does not prevent reflux; other stomach contents can cause irritation. Reflux Gourmet discussed to form a barrier and prevent nocturnal reflux. - Continue pantoprazole 40 mg daily - Recommend Reflux Gourmet supplement post-meals, especially after dinner - Continue diet and lifestyle modifications for reflux management  Hiatal Hernia Known hiatal hernia potentially contributing to reflux symptoms. Further evaluation pending esophagram results, we discussed that significant hernia and severe reflux may warrant surgical intervention. - Evaluate for hiatal hernia during esophagram - Consider further management based on esophagram results  Lymphoma (Remission) Lymphoma treated with chemotherapy, completed in 2010. Currently in remission. Recent bone marrow biopsy to check  for recurrence. - Monitor for signs of recurrence   Follow-up - follow-up appointment after esophagram results are available.     Update 07/24/23 Assessment and Plan    Esophageal Dysmotility and esophageal  dysphagia Esophagram indicated esophageal dysmotility without stricture or anatomical abnormalities. Modified barium swallow showed reduced pharyngeal muscle strength, common with aging. Discussed that there is no surgical solution. Swallow therapy was suggested but deferred by the patient. They can continue a regular diet with precautions. - Hold off on swallow therapy referral for now - Continue regular diet with precautions - Discontinue Reflux Gourmet if difficult to swallow and not providing relief  Gastroesophageal Reflux Disease (GERD) Currently on reflux medication. Reported difficulty swallowing Reflux Gourmet due to its thick consistency. Advised to discontinue if not providing relief. - Discontinue Reflux Gourmet if difficult to swallow and not providing relief.  - continue Protonix 40 mg daily  F/u  - RTC PRN       Ashok Croon, MD Otolaryngology [facility] Phone: [phone] Fax: [phone]    07/24/2023, 9:00 AM

## 2023-07-28 DIAGNOSIS — M170 Bilateral primary osteoarthritis of knee: Secondary | ICD-10-CM | POA: Diagnosis not present

## 2023-08-03 ENCOUNTER — Inpatient Hospital Stay: Payer: Medicare Other | Attending: Hematology & Oncology

## 2023-08-03 ENCOUNTER — Other Ambulatory Visit: Payer: Self-pay

## 2023-08-03 ENCOUNTER — Inpatient Hospital Stay (HOSPITAL_BASED_OUTPATIENT_CLINIC_OR_DEPARTMENT_OTHER): Payer: Medicare Other | Admitting: Hematology & Oncology

## 2023-08-03 VITALS — BP 127/45 | HR 69 | Temp 98.4°F | Resp 17 | Ht 66.0 in | Wt 193.0 lb

## 2023-08-03 DIAGNOSIS — E611 Iron deficiency: Secondary | ICD-10-CM | POA: Diagnosis not present

## 2023-08-03 DIAGNOSIS — C8300 Small cell B-cell lymphoma, unspecified site: Secondary | ICD-10-CM | POA: Diagnosis not present

## 2023-08-03 DIAGNOSIS — Z79899 Other long term (current) drug therapy: Secondary | ICD-10-CM | POA: Diagnosis not present

## 2023-08-03 DIAGNOSIS — E8589 Other amyloidosis: Secondary | ICD-10-CM | POA: Diagnosis not present

## 2023-08-03 DIAGNOSIS — Z8572 Personal history of non-Hodgkin lymphomas: Secondary | ICD-10-CM | POA: Diagnosis not present

## 2023-08-03 DIAGNOSIS — L930 Discoid lupus erythematosus: Secondary | ICD-10-CM

## 2023-08-03 DIAGNOSIS — E859 Amyloidosis, unspecified: Secondary | ICD-10-CM | POA: Diagnosis not present

## 2023-08-03 LAB — CMP (CANCER CENTER ONLY)
ALT: 9 U/L (ref 0–44)
AST: 15 U/L (ref 15–41)
Albumin: 4.3 g/dL (ref 3.5–5.0)
Alkaline Phosphatase: 104 U/L (ref 38–126)
Anion gap: 10 (ref 5–15)
BUN: 21 mg/dL (ref 8–23)
CO2: 26 mmol/L (ref 22–32)
Calcium: 9 mg/dL (ref 8.9–10.3)
Chloride: 103 mmol/L (ref 98–111)
Creatinine: 1.13 mg/dL — ABNORMAL HIGH (ref 0.44–1.00)
GFR, Estimated: 51 mL/min — ABNORMAL LOW (ref >60)
Glucose, Bld: 96 mg/dL (ref 70–99)
Potassium: 3.6 mmol/L (ref 3.5–5.1)
Sodium: 139 mmol/L (ref 135–145)
Total Bilirubin: 0.7 mg/dL (ref 0.0–1.2)
Total Protein: 7.2 g/dL (ref 6.5–8.1)

## 2023-08-03 LAB — CBC WITH DIFFERENTIAL (CANCER CENTER ONLY)
Abs Immature Granulocytes: 0.04 10*3/uL (ref 0.00–0.07)
Basophils Absolute: 0 10*3/uL (ref 0.0–0.1)
Basophils Relative: 1 %
Eosinophils Absolute: 0.1 10*3/uL (ref 0.0–0.5)
Eosinophils Relative: 1 %
HCT: 34.5 % — ABNORMAL LOW (ref 36.0–46.0)
Hemoglobin: 11.2 g/dL — ABNORMAL LOW (ref 12.0–15.0)
Immature Granulocytes: 1 %
Lymphocytes Relative: 28 %
Lymphs Abs: 2.1 10*3/uL (ref 0.7–4.0)
MCH: 30.9 pg (ref 26.0–34.0)
MCHC: 32.5 g/dL (ref 30.0–36.0)
MCV: 95.3 fL (ref 80.0–100.0)
Monocytes Absolute: 0.5 10*3/uL (ref 0.1–1.0)
Monocytes Relative: 6 %
Neutro Abs: 4.8 10*3/uL (ref 1.7–7.7)
Neutrophils Relative %: 63 %
Platelet Count: 149 10*3/uL — ABNORMAL LOW (ref 150–400)
RBC: 3.62 MIL/uL — ABNORMAL LOW (ref 3.87–5.11)
RDW: 13.4 % (ref 11.5–15.5)
WBC Count: 7.5 10*3/uL (ref 4.0–10.5)
nRBC: 0 % (ref 0.0–0.2)

## 2023-08-03 LAB — IRON AND IRON BINDING CAPACITY (CC-WL,HP ONLY)
Iron: 48 ug/dL (ref 28–170)
Saturation Ratios: 15 % (ref 10.4–31.8)
TIBC: 312 ug/dL (ref 250–450)
UIBC: 264 ug/dL (ref 148–442)

## 2023-08-03 LAB — RETICULOCYTES
Immature Retic Fract: 11.7 % (ref 2.3–15.9)
RBC.: 3.65 MIL/uL — ABNORMAL LOW (ref 3.87–5.11)
Retic Count, Absolute: 63.5 10*3/uL (ref 19.0–186.0)
Retic Ct Pct: 1.7 % (ref 0.4–3.1)

## 2023-08-03 LAB — VITAMIN B12: Vitamin B-12: 473 pg/mL (ref 180–914)

## 2023-08-03 LAB — TSH: TSH: 1.713 u[IU]/mL (ref 0.350–4.500)

## 2023-08-03 LAB — FERRITIN: Ferritin: 88 ng/mL (ref 11–307)

## 2023-08-03 NOTE — Progress Notes (Signed)
Hematology and Oncology Follow Up Visit  [name] [id number] 07/13/1950 73 y.o. 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  It sounds like she did not need to have cataract surgery in the Spring.  She is having more problems with her right eye.  I know that she developed this amyloid.  Again, this is not related to her having the past history of lymphoplasmacytic lymphoma.  She also has a knee problem.  She has a brace on the right knee.  So far, she does not need any surgery for this.  When we last saw her, she had a 0.3 g/dL monoclonal spike in her blood.  Her IgM level was pretty much nondetectable.  She had normal immunoglobulin light chains.  She has had no fever.  She did have a nice holiday season.  Her appetite is okay.  She is losing some weight.  She has had no bleeding.  She has had no rashes.  There has been no headache.  She has had no cough or shortness of breath.  A great grandson will be born soon.  She is looking forward to this.  Overall, I would have to say that her performance status is ECOG 1.   Medications:  Current Outpatient Medications:    acetaminophen (TYLENOL) 500 MG tablet, Take 1,000 mg by mouth every 6 (six) hours as needed for mild pain or headache., Disp: , Rfl:    albuterol (VENTOLIN HFA) 108 (90 Base) MCG/ACT inhaler, Inhale 2 puffs into the lungs every 6 (six) hours as needed for wheezing or shortness of breath., Disp: 8 g, Rfl: 6   aspirin 81 MG chewable tablet, Chew by mouth daily., Disp: , Rfl:    cholecalciferol (VITAMIN D) 1000 UNITS tablet, Take 1,000 Units by mouth daily. , Disp: , Rfl:    fluticasone-salmeterol (ADVAIR HFA) 230-21 MCG/ACT inhaler, Inhale 2 puffs into the lungs 2 (two) times daily., Disp: 1 each, Rfl: 5   hydroxychloroquine (PLAQUENIL) 200 MG tablet, Take  200 mg by mouth daily., Disp: , Rfl:    latanoprost (XALATAN) 0.005 % ophthalmic solution, Place 1 drop into both eyes at bedtime., Disp: , Rfl:    losartan (COZAAR) 50 MG tablet, Take 1 tablet (50 mg total) by mouth daily., Disp: 90 tablet, Rfl: 3   pantoprazole (PROTONIX) 40 MG tablet, Take 40 mg by mouth daily., Disp: , Rfl:    potassium chloride SA (KLOR-CON M) 20 MEQ tablet, Take 1 tablet (20 mEq total) by mouth 2 (two) times daily. 2 in the morning and 1 in the evening (Patient taking differently: Take 20 mEq by mouth 2 (two) times daily. 06/15/2023 1 in the morning and 1 in the evening), Disp: 60 tablet, Rfl: 11   rosuvastatin (CRESTOR) 20 MG tablet, Take 1 tablet (20 mg total) by mouth daily., Disp: 30 tablet, Rfl: 11   Spacer/Aero-Holding Chambers (AEROCHAMBER MV) inhaler, Use as instructed, Disp: 1 each, Rfl: 0   spironolactone (ALDACTONE) 25 MG tablet, Take 0.5 tablets (12.5 mg total) by mouth daily., Disp: 30 tablet, Rfl: 11   torsemide (DEMADEX) 20 MG tablet, Take 3 tablets by mouth once daily, Disp: 90 tablet, Rfl: 3   Turmeric 500 MG CAPS, Take by mouth daily., Disp: , Rfl:    valACYclovir (VALTREX) 1000 MG tablet, Take 1,000 mg by mouth 2 (two) times daily  as needed., Disp: , Rfl:   Current Facility-Administered Medications:    lidocaine-EPINEPHrine (XYLOCAINE W/EPI) 1 %-1:100000 (with pres) injection 10 mL, 10 mL, Infiltration, Once, Croitoru, Mihai, MD   lidocaine-EPINEPHrine (XYLOCAINE W/EPI) 1 %-1:100000 (with pres) injection 15 mL, 15 mL, Infiltration, Once, Croitoru, Mihai, MD  Allergies:  Allergies  Allergen Reactions   Iodinated Contrast Media Other (See Comments)    Kidney failure.    Ciprofloxacin Itching and Rash   E-Mycin [Erythromycin Base] Nausea And Vomiting   Oxycodone Other (See Comments)    Hallucination.    Past Medical History, Surgical history, Social history, and Family History were reviewed and updated.  Review of Systems: Review of Systems   Constitutional: Negative.   HENT:  Negative.    Eyes:  Positive for eye problems.  Respiratory: Negative.    Cardiovascular: Negative.   Gastrointestinal: Negative.   Endocrine: Negative.   Genitourinary: Negative.    Musculoskeletal:  Positive for back pain.  Neurological: Negative.   Hematological: Negative.   Psychiatric/Behavioral: Negative.      Physical Exam:  height is 5\' 6"  (1.676 m) and weight is 193 lb (87.5 kg). Her oral temperature is 98.4 F (36.9 C). Her blood pressure is 127/45 (abnormal) and her pulse is 69. Her respiration is 17 and oxygen saturation is 98%.   Wt Readings from Last 3 Encounters:  08/03/23 193 lb (87.5 kg)  06/15/23 199 lb (90.3 kg)  05/17/23 197 lb (89.4 kg)    Physical Exam Vitals reviewed.  HENT:     Head: Normocephalic and atraumatic.  Eyes:     Pupils: Pupils are equal, round, and reactive to light.     Comments: She has some slight ptosis of the right eye.  Her extraocular muscles are intact.  Cardiovascular:     Rate and Rhythm: Normal rate and regular rhythm.     Heart sounds: Normal heart sounds.  Pulmonary:     Effort: Pulmonary effort is normal.     Breath sounds: Normal breath sounds.  Abdominal:     General: Bowel sounds are normal.     Palpations: Abdomen is soft.  Musculoskeletal:        General: No tenderness or deformity. Normal range of motion.     Cervical back: Normal range of motion.  Lymphadenopathy:     Cervical: No cervical adenopathy.  Skin:    General: Skin is warm and dry.     Findings: No erythema or rash.  Neurological:     Mental Status: She is alert and oriented to person, place, and time.  Psychiatric:        Behavior: Behavior normal.        Thought Content: Thought content normal.        Judgment: Judgment normal.      Lab Results  Component Value Date   WBC 7.5 08/03/2023   HGB 11.2 (L) 08/03/2023   HCT 34.5 (L) 08/03/2023   MCV 95.3 08/03/2023   PLT 149 (L) 08/03/2023     Chemistry       Component Value Date/Time   NA 139 08/03/2023 1200   NA 144 04/25/2023 1052   NA 142 05/18/2015 1257   K 3.6 08/03/2023 1200   K 3.6 05/18/2015 1257   CL 103 08/03/2023 1200   CL 104 01/15/2013 0844   CO2 26 08/03/2023 1200   CO2 26 05/18/2015 1257   BUN 21 08/03/2023 1200   BUN 12 04/25/2023 1052   BUN 12.6 05/18/2015 1257  CREATININE 1.13 (H) 08/03/2023 1200   CREATININE 0.8 05/18/2015 1257      Component Value Date/Time   CALCIUM 9.0 08/03/2023 1200   CALCIUM 9.1 05/18/2015 1257   ALKPHOS 104 08/03/2023 1200   ALKPHOS 122 05/18/2015 1257   AST 15 08/03/2023 1200   AST 17 05/18/2015 1257   ALT 9 08/03/2023 1200   ALT 13 05/18/2015 1257   BILITOT 0.7 08/03/2023 1200   BILITOT 0.72 05/18/2015 1257      Impression and Plan: Ms. Sieh is a very nice 73 year old African-American female.  We had seen her in the remote past with a lymphoplasmacytic lymphoma.  She was treated for this back in March 2010.  She has been in remission since.  Again, I cannot find any malignancy that would account for the ocular amyloidosis.  I would like to get her back in a couple months.  I would like to see her back before she has the cataract surgery just to make sure that all of her counts are doing well.  I see no problem with her having cataract surgery.  I see no problem with her having knee surgery if that is necessary for her right knee.   Josph Macho, MD 1/30/20251:19 PM

## 2023-08-04 ENCOUNTER — Telehealth: Payer: Self-pay

## 2023-08-04 ENCOUNTER — Other Ambulatory Visit: Payer: Self-pay | Admitting: Hematology & Oncology

## 2023-08-04 ENCOUNTER — Encounter: Payer: Self-pay | Admitting: Hematology & Oncology

## 2023-08-04 DIAGNOSIS — D508 Other iron deficiency anemias: Secondary | ICD-10-CM

## 2023-08-04 DIAGNOSIS — D509 Iron deficiency anemia, unspecified: Secondary | ICD-10-CM | POA: Insufficient documentation

## 2023-08-04 HISTORY — DX: Iron deficiency anemia, unspecified: D50.9

## 2023-08-04 NOTE — Telephone Encounter (Signed)
 Advised via MyChart.

## 2023-08-09 DIAGNOSIS — M170 Bilateral primary osteoarthritis of knee: Secondary | ICD-10-CM | POA: Diagnosis not present

## 2023-08-15 ENCOUNTER — Inpatient Hospital Stay: Payer: Medicare Other | Attending: Hematology & Oncology

## 2023-08-15 VITALS — BP 112/45 | HR 53 | Temp 97.4°F | Resp 18

## 2023-08-15 DIAGNOSIS — D509 Iron deficiency anemia, unspecified: Secondary | ICD-10-CM | POA: Diagnosis not present

## 2023-08-15 DIAGNOSIS — D508 Other iron deficiency anemias: Secondary | ICD-10-CM

## 2023-08-15 MED ORDER — SODIUM CHLORIDE 0.9 % IV SOLN
INTRAVENOUS | Status: DC
Start: 2023-08-15 — End: 2023-08-15

## 2023-08-15 MED ORDER — SODIUM CHLORIDE 0.9 % IV SOLN
510.0000 mg | Freq: Once | INTRAVENOUS | Status: AC
  Administered 2023-08-15: 510 mg via INTRAVENOUS
  Filled 2023-08-15: qty 17

## 2023-08-15 NOTE — Patient Instructions (Signed)

## 2023-08-18 DIAGNOSIS — M170 Bilateral primary osteoarthritis of knee: Secondary | ICD-10-CM | POA: Diagnosis not present

## 2023-08-22 ENCOUNTER — Inpatient Hospital Stay: Payer: Medicare Other

## 2023-08-22 ENCOUNTER — Other Ambulatory Visit (HOSPITAL_BASED_OUTPATIENT_CLINIC_OR_DEPARTMENT_OTHER): Payer: Self-pay | Admitting: Family

## 2023-08-22 VITALS — BP 127/47 | HR 63 | Temp 97.9°F | Resp 18

## 2023-08-22 DIAGNOSIS — D508 Other iron deficiency anemias: Secondary | ICD-10-CM

## 2023-08-22 DIAGNOSIS — I5032 Chronic diastolic (congestive) heart failure: Secondary | ICD-10-CM

## 2023-08-22 DIAGNOSIS — D509 Iron deficiency anemia, unspecified: Secondary | ICD-10-CM | POA: Diagnosis not present

## 2023-08-22 MED ORDER — SODIUM CHLORIDE 0.9 % IV SOLN
510.0000 mg | Freq: Once | INTRAVENOUS | Status: AC
  Administered 2023-08-22: 510 mg via INTRAVENOUS
  Filled 2023-08-22: qty 17

## 2023-08-22 MED ORDER — SODIUM CHLORIDE 0.9 % IV SOLN: INTRAVENOUS | Status: DC

## 2023-08-22 NOTE — Patient Instructions (Signed)

## 2023-08-25 ENCOUNTER — Ambulatory Visit (HOSPITAL_BASED_OUTPATIENT_CLINIC_OR_DEPARTMENT_OTHER): Payer: Medicare Other | Admitting: Cardiovascular Disease

## 2023-08-28 DIAGNOSIS — M170 Bilateral primary osteoarthritis of knee: Secondary | ICD-10-CM | POA: Diagnosis not present

## 2023-08-30 ENCOUNTER — Ambulatory Visit (HOSPITAL_BASED_OUTPATIENT_CLINIC_OR_DEPARTMENT_OTHER): Payer: Medicare Other | Admitting: Cardiovascular Disease

## 2023-08-30 ENCOUNTER — Encounter (HOSPITAL_BASED_OUTPATIENT_CLINIC_OR_DEPARTMENT_OTHER): Payer: Self-pay | Admitting: Cardiovascular Disease

## 2023-08-30 VITALS — BP 110/60 | HR 64 | Ht 66.0 in | Wt 190.0 lb

## 2023-08-30 DIAGNOSIS — E7800 Pure hypercholesterolemia, unspecified: Secondary | ICD-10-CM

## 2023-08-30 DIAGNOSIS — I10 Essential (primary) hypertension: Secondary | ICD-10-CM | POA: Diagnosis not present

## 2023-08-30 DIAGNOSIS — I6523 Occlusion and stenosis of bilateral carotid arteries: Secondary | ICD-10-CM

## 2023-08-30 DIAGNOSIS — I5032 Chronic diastolic (congestive) heart failure: Secondary | ICD-10-CM | POA: Diagnosis not present

## 2023-08-30 NOTE — Progress Notes (Signed)
 Cardiology Office Note:  .   Date:  08/30/2023  ID:  [name], DOB [birth year], MRN [id number] PCP: Georgianne Fick, MD  [facility] Providers Cardiologist:  Chilton Si, MD    History of Present Illness: .    [name] is a 73 y.o. female with prior stroke, HFpEF, rheumatoid arthritis, lupus, interstitial lung disease, hypertension, prior lymphoplasmacytoid lymphoma, and asthma here for follow up.  She was initially seen in the ED 04/2018 with chest pain.  She reported L shoulder and chest wall pain.  EKG revealed diffuse T wave inversions.  Cardiac enzymes were negative x3.  She was referred for outpatient stress testing and had a Lexiscan Myoview 05/07/2018 that revealed LVEF 68%.  There was no ischemia.  She previously had an echo 01/2018 that revealed LVEF 60 to 65% with grade 2 diastolic dysfunction.     [name] had an episode of near syncope 03/2019.  Since then metoprolol was stopped.  She saw Dr. Salena Saner in and had an implantable loop monitor placed 12/2019.  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  She denied any recurrent syncope since that time. She had another fall 08/2020 and didn't know what happened.  There were no associated events on her ILR.  She had another fall 02/2021.  She was walking into the bathroom and turned her head.  When she turned her head back and when she started talking she fell.  There was no preceding lightheadedness or dizziness.  She had no chest pain or shortness  of breath and did not lose consciousness.  She had surgery on her right hip 03/2021.     She was recovering from her right hip surgery and in PT. She was able to walk with a cane and noted only some swelling in her right leg. She reported well controlled blood pressures at home. She has not had any arrhythmias on her ILR.  At the last visit, she was taking some extra lasix due to swelling after her hip replacement but was otherwise doing well. Her blood pressure was low at  times so losartan was reduced. She had her loop recorded extracted 12/2021.   At her visit 05/2022 she noted shortness of breath.  Echo 05/2022 revealed LVEF 65-70% with indeterminate diastolic function.  Nuclear stress test was negative for ischemia.  She was referred to our PREP program at the [facility].  She had renal Dopplers 07/2022 that were normal.  She had bone marrow biopsy that was negative for recurrent cancer.  She saw Gillian Shields, NP 12/2022 was doing well.  She had increased lower extremity edema was started on Farxiga.  Lasix was transitioned to torsemide.  She had to stop [place] due to UTI.  She last saw Gillian Shields, NP 04/2023 and spironolactone was added.  Discussed the use of AI scribe software for clinical note transcription with the patient, who gave verbal consent to proceed.  History of Present Illness   [name] experienced a single episode of palpitations one night last week while in bed, describing her heart as 'pounding really, really hard' and noting visible movement of her hand with the heartbeat. The episode lasted a few seconds and was accompanied by shortness of breath, but no lightheadedness or dizziness. No recent caffeine intake or eating before the episode. This was the first occurrence, and it has not recurred since. No chest pain, shortness of breath, or palpitations were noted during the review of symptoms.  She reports frequent  falls, stating that she falls 'a lot, a lot' and is afraid of hurting herself. She has an upcoming appointment with a neurologist on April 7th to investigate the cause of her falls. The falls are described as sudden, without any preceding lightheadedness or dizziness, and sometimes she is unaware of falling until she is already on the ground. She avoids stairs due to knee issues and the fear of falling.  She mentions unintentional weight loss despite not trying to lose weight, attributing it to a poor appetite. Her blood pressure has been  stable, typically in the 120s over 70s. She recently had an iron infusion, which has kept her blood counts stable.  She is currently on losartan and spironolactone for blood pressure, rosuvastatin for cholesterol, and takes potassium supplements. She experiences sharp pains mostly in her left breast, legs, feet, and sometimes hands. Her breathing is generally good, and she reports that the swelling she previously experienced is now gone.       ROS:  As per HPI  Studies Reviewed: .        Risk Assessment/Calculations:             Physical Exam:   VS:  BP 110/60 (BP Location: Left Arm, Patient Position: Sitting, Cuff Size: Normal)   Pulse 64   Ht 5\' 6"  (1.676 m)   Wt 190 lb (86.2 kg)   SpO2 98%   BMI 30.67 kg/m  , BMI Body mass index is 30.67 kg/m. GENERAL:  Well appearing HEENT: Pupils equal round and reactive, fundi not visualized, oral mucosa unremarkable NECK:  No jugular venous distention, waveform within normal limits, carotid upstroke brisk and symmetric, no bruits, no thyromegaly LUNGS:  Clear to auscultation bilaterally HEART:  RRR.  PMI not displaced or sustained,S1 and S2 within normal limits, no S3, no S4, no clicks, no rubs, no murmurs ABD:  Flat, positive bowel sounds normal in frequency in pitch, no bruits, no rebound, no guarding, no midline pulsatile mass, no hepatomegaly, no splenomegaly EXT:  2 plus pulses throughout, L LE edema to the ankle, no cyanosis no clubbing SKIN:  No rashes no nodules NEURO:  Cranial nerves II through XII grossly intact, motor grossly intact throughout PSYCH:  Cognitively intact, oriented to person place and time   ASSESSMENT AND PLAN: .    #  Palpitations Single episode of palpitations with associated shortness of breath. No associated lightheadedness or dizziness. No clear triggers identified. Labs including thyroid function and electrolytes are normal. -Monitor for recurrence of palpitations. If symptoms become more frequent,  consider ambulatory EKG monitoring.  # Frequent Falls Multiple falls with no clear precipitant. No associated lightheadedness or dizziness. Neurology consultation pending. -Continue with planned neurology consultation.  # Hypertension Well controlled on Losartan and Spironolactone. Recent blood pressure readings consistently in the 120s/70s. -Continue current antihypertensive regimen.   #CVA:  # Hyperlipidemia:  Lipids well controlled on Rosuvastatin. Recent LDL 71.  Continue aspirin.  -Continue Rosuvastatin.   # HFpEF:  Lower extremity edema Resolved. Currently on Torsemide 60mg  daily with good response. -Reduce Torsemide to 40-60mg  daily as needed. Monitor for recurrence of edema or shortness of breath. -Didn't tolerate SGLT2 inhibitor BP control as above  Follow-up Stable overall from a cardiac standpoint. -Plan for follow-up with nurse practitioner in 6 months and with physician in 1 year. If palpitations become more frequent or other concerns arise, patient to contact office for earlier appointment.       Signed, Chilton Si, MD

## 2023-08-30 NOTE — Patient Instructions (Addendum)
 Medication Instructions:  DECREASE YOUR TORSEMIDE TO 40 MG DAILY. OK TO USE 50 MG AS NEEDED   *If you need a refill on your cardiac medications before your next appointment, please call your pharmacy*  Lab Work: NONE  Testing/Procedures: NONE  Follow-Up: At [facility], you and your health needs are our priority.  As part of our continuing mission to provide you with exceptional heart care, we have created designated Provider Care Teams.  These Care Teams include your primary Cardiologist (physician) and Advanced Practice Providers (APPs -  Physician Assistants and Nurse Practitioners) who all work together to provide you with the care you need, when you need it.  We recommend signing up for the patient portal called "MyChart".  Sign up information is provided on this After Visit Summary.  MyChart is used to connect with patients for Virtual Visits (Telemedicine).  Patients are able to view lab/test results, encounter notes, upcoming appointments, etc.  Non-urgent messages can be sent to your provider as well.   To learn more about what you can do with MyChart, go to [email or web address].    Your next appointment:   6 month(s)  The format for your next appointment:   In Person  Provider:   Ronn Melena NP   1 YEAR WITH DR [facility]

## 2023-08-30 NOTE — Addendum Note (Signed)
 Addended by: Regis Bill B on: 08/30/2023 08:50 AM   Modules accepted: Orders

## 2023-09-08 ENCOUNTER — Other Ambulatory Visit: Payer: Self-pay

## 2023-09-08 MED ORDER — LOSARTAN POTASSIUM 50 MG PO TABS: 50.0000 mg | ORAL_TABLET | Freq: Every day | ORAL | 3 refills | Status: DC

## 2023-09-13 DIAGNOSIS — I5032 Chronic diastolic (congestive) heart failure: Secondary | ICD-10-CM | POA: Diagnosis not present

## 2023-09-14 DIAGNOSIS — H2513 Age-related nuclear cataract, bilateral: Secondary | ICD-10-CM | POA: Diagnosis not present

## 2023-09-14 DIAGNOSIS — H40003 Preglaucoma, unspecified, bilateral: Secondary | ICD-10-CM | POA: Diagnosis not present

## 2023-09-14 DIAGNOSIS — H16229 Keratoconjunctivitis sicca, not specified as Sjogren's, unspecified eye: Secondary | ICD-10-CM | POA: Diagnosis not present

## 2023-09-14 DIAGNOSIS — H16223 Keratoconjunctivitis sicca, not specified as Sjogren's, bilateral: Secondary | ICD-10-CM | POA: Diagnosis not present

## 2023-09-14 DIAGNOSIS — E854 Organ-limited amyloidosis: Secondary | ICD-10-CM | POA: Diagnosis not present

## 2023-09-14 DIAGNOSIS — H1189 Other specified disorders of conjunctiva: Secondary | ICD-10-CM | POA: Diagnosis not present

## 2023-09-14 DIAGNOSIS — Z79899 Other long term (current) drug therapy: Secondary | ICD-10-CM | POA: Diagnosis not present

## 2023-09-14 DIAGNOSIS — H1132 Conjunctival hemorrhage, left eye: Secondary | ICD-10-CM | POA: Diagnosis not present

## 2023-09-14 DIAGNOSIS — H16121 Filamentary keratitis, right eye: Secondary | ICD-10-CM | POA: Diagnosis not present

## 2023-09-14 DIAGNOSIS — H119 Unspecified disorder of conjunctiva: Secondary | ICD-10-CM | POA: Diagnosis not present

## 2023-09-15 DIAGNOSIS — Z79899 Other long term (current) drug therapy: Secondary | ICD-10-CM | POA: Diagnosis not present

## 2023-09-15 DIAGNOSIS — M359 Systemic involvement of connective tissue, unspecified: Secondary | ICD-10-CM | POA: Diagnosis not present

## 2023-09-15 DIAGNOSIS — R197 Diarrhea, unspecified: Secondary | ICD-10-CM | POA: Diagnosis not present

## 2023-09-15 DIAGNOSIS — M3501 Sicca syndrome with keratoconjunctivitis: Secondary | ICD-10-CM | POA: Diagnosis not present

## 2023-09-21 DIAGNOSIS — M25552 Pain in left hip: Secondary | ICD-10-CM | POA: Diagnosis not present

## 2023-09-22 ENCOUNTER — Other Ambulatory Visit (HOSPITAL_BASED_OUTPATIENT_CLINIC_OR_DEPARTMENT_OTHER): Payer: Self-pay | Admitting: Family

## 2023-09-22 DIAGNOSIS — I5032 Chronic diastolic (congestive) heart failure: Secondary | ICD-10-CM

## 2023-10-02 ENCOUNTER — Encounter: Payer: Self-pay | Admitting: Hematology & Oncology

## 2023-10-02 ENCOUNTER — Inpatient Hospital Stay: Payer: Medicare Other | Attending: Hematology & Oncology

## 2023-10-02 ENCOUNTER — Inpatient Hospital Stay: Payer: Medicare Other | Admitting: Hematology & Oncology

## 2023-10-02 ENCOUNTER — Other Ambulatory Visit: Payer: Self-pay

## 2023-10-02 VITALS — BP 122/51 | HR 66 | Temp 98.0°F | Resp 18 | Ht 66.0 in | Wt 190.0 lb

## 2023-10-02 DIAGNOSIS — M1990 Unspecified osteoarthritis, unspecified site: Secondary | ICD-10-CM | POA: Diagnosis not present

## 2023-10-02 DIAGNOSIS — D508 Other iron deficiency anemias: Secondary | ICD-10-CM | POA: Diagnosis not present

## 2023-10-02 DIAGNOSIS — C830A Small cell b-cell lymphoma, in remission: Secondary | ICD-10-CM | POA: Diagnosis not present

## 2023-10-02 DIAGNOSIS — E859 Amyloidosis, unspecified: Secondary | ICD-10-CM | POA: Diagnosis not present

## 2023-10-02 DIAGNOSIS — M3219 Other organ or system involvement in systemic lupus erythematosus: Secondary | ICD-10-CM | POA: Diagnosis not present

## 2023-10-02 DIAGNOSIS — C8300 Small cell B-cell lymphoma, unspecified site: Secondary | ICD-10-CM

## 2023-10-02 DIAGNOSIS — D509 Iron deficiency anemia, unspecified: Secondary | ICD-10-CM | POA: Insufficient documentation

## 2023-10-02 LAB — CBC WITH DIFFERENTIAL (CANCER CENTER ONLY)
Abs Immature Granulocytes: 0.02 10*3/uL (ref 0.00–0.07)
Basophils Absolute: 0 10*3/uL (ref 0.0–0.1)
Basophils Relative: 1 %
Eosinophils Absolute: 0.1 10*3/uL (ref 0.0–0.5)
Eosinophils Relative: 1 %
HCT: 37.7 % (ref 36.0–46.0)
Hemoglobin: 12.3 g/dL (ref 12.0–15.0)
Immature Granulocytes: 0 %
Lymphocytes Relative: 33 %
Lymphs Abs: 1.9 10*3/uL (ref 0.7–4.0)
MCH: 32.1 pg (ref 26.0–34.0)
MCHC: 32.6 g/dL (ref 30.0–36.0)
MCV: 98.4 fL (ref 80.0–100.0)
Monocytes Absolute: 0.5 10*3/uL (ref 0.1–1.0)
Monocytes Relative: 8 %
Neutro Abs: 3.4 10*3/uL (ref 1.7–7.7)
Neutrophils Relative %: 57 %
Platelet Count: 144 10*3/uL — ABNORMAL LOW (ref 150–400)
RBC: 3.83 MIL/uL — ABNORMAL LOW (ref 3.87–5.11)
RDW: 13 % (ref 11.5–15.5)
WBC Count: 5.9 10*3/uL (ref 4.0–10.5)
nRBC: 0 % (ref 0.0–0.2)

## 2023-10-02 LAB — CMP (CANCER CENTER ONLY)
ALT: 12 U/L (ref 0–44)
AST: 18 U/L (ref 15–41)
Albumin: 4.2 g/dL (ref 3.5–5.0)
Alkaline Phosphatase: 117 U/L (ref 38–126)
Anion gap: 9 (ref 5–15)
BUN: 14 mg/dL (ref 8–23)
CO2: 29 mmol/L (ref 22–32)
Calcium: 9.2 mg/dL (ref 8.9–10.3)
Chloride: 106 mmol/L (ref 98–111)
Creatinine: 1.15 mg/dL — ABNORMAL HIGH (ref 0.44–1.00)
GFR, Estimated: 50 mL/min — ABNORMAL LOW (ref >60)
Glucose, Bld: 110 mg/dL — ABNORMAL HIGH (ref 70–99)
Potassium: 3.7 mmol/L (ref 3.5–5.1)
Sodium: 144 mmol/L (ref 135–145)
Total Bilirubin: 0.7 mg/dL (ref 0.0–1.2)
Total Protein: 7.1 g/dL (ref 6.5–8.1)

## 2023-10-02 LAB — LACTATE DEHYDROGENASE: LDH: 156 U/L (ref 98–192)

## 2023-10-02 MED ORDER — TRAZODONE HCL 50 MG PO TABS: 50.0000 mg | ORAL_TABLET | Freq: Every evening | ORAL | 2 refills | Status: DC | PRN

## 2023-10-02 NOTE — Progress Notes (Signed)
 Hematology and Oncology Follow Up Visit  [name] [id number] 03-05-1951 73 y.o. 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  Unfortunately, she still does not feel all that well.  We felt she had a little bit of iron deficiency back when we last saw her in January.  We did go ahead and give her 2 doses of IV iron.  Her hemoglobin has come up nicely.  However, she still feels weak.  She feels tired.  She will see a neurologist in a week or so.  I think she has been having some falling episodes.  She will have cataract surgery.  The right leg is taking care of in about 3 weeks.  The left eye will get taken take care of 2 weeks after that.  She has had no problems with respect to the history of the lymphoplasmacytic lymphoma.  When we last saw her, the M spike was 0.3 g/dL.  Her IgM level was less than 5 mg/dL.  Currently, there is been no fever.  She has had no bleeding.  She has had no nausea or vomiting.  She has had no cough.  Currently, I would say her performance status is probably ECOG 1.    Medications:  Current Outpatient Medications:    acetaminophen (TYLENOL) 500 MG tablet, Take 1,000 mg by mouth every 6 (six) hours as needed for mild pain or headache., Disp: , Rfl:    albuterol (VENTOLIN HFA) 108 (90 Base) MCG/ACT inhaler, Inhale 2 puffs into the lungs every 6 (six) hours as needed for wheezing or shortness of breath., Disp: 8 g, Rfl: 6   aspirin 81 MG chewable tablet, Chew by mouth daily., Disp: , Rfl:    cholecalciferol (VITAMIN D) 1000 UNITS tablet, Take 1,000 Units by mouth daily. , Disp: , Rfl:    fluticasone-salmeterol (ADVAIR HFA) 230-21 MCG/ACT inhaler, Inhale 2 puffs into the lungs 2 (two) times daily., Disp: 1 each, Rfl: 5   hydroxychloroquine (PLAQUENIL)  200 MG tablet, Take 200 mg by mouth daily., Disp: , Rfl:    latanoprost (XALATAN) 0.005 % ophthalmic solution, Place 1 drop into both eyes at bedtime., Disp: , Rfl:    losartan (COZAAR) 50 MG tablet, Take 1 tablet (50 mg total) by mouth daily., Disp: 90 tablet, Rfl: 3   pantoprazole (PROTONIX) 40 MG tablet, Take 40 mg by mouth daily., Disp: , Rfl:    potassium chloride SA (KLOR-CON M) 20 MEQ tablet, Take 1 tablet (20 mEq total) by mouth 2 (two) times daily. 2 in the morning and 1 in the evening (Patient taking differently: Take 20 mEq by mouth 2 (two) times daily. 06/15/2023 1 in the morning and 1 in the evening), Disp: 60 tablet, Rfl: 11   rosuvastatin (CRESTOR) 20 MG tablet, Take 1 tablet (20 mg total) by mouth daily., Disp: 30 tablet, Rfl: 11   Spacer/Aero-Holding Chambers (AEROCHAMBER MV) inhaler, Use as instructed, Disp: 1 each, Rfl: 0   torsemide (DEMADEX) 20 MG tablet, Take 3 tablets by mouth once daily, Disp: 90 tablet, Rfl: 3   Turmeric 500 MG CAPS, Take by mouth daily., Disp: , Rfl:    valACYclovir (VALTREX) 1000 MG tablet, Take 1,000 mg by mouth 2 (two) times daily as needed., Disp: , Rfl:    spironolactone (ALDACTONE) 25 MG  tablet, Take 0.5 tablets (12.5 mg total) by mouth daily., Disp: 30 tablet, Rfl: 11  Current Facility-Administered Medications:    lidocaine-EPINEPHrine (XYLOCAINE W/EPI) 1 %-1:100000 (with pres) injection 10 mL, 10 mL, Infiltration, Once, Croitoru, Mihai, MD   lidocaine-EPINEPHrine (XYLOCAINE W/EPI) 1 %-1:100000 (with pres) injection 15 mL, 15 mL, Infiltration, Once, Croitoru, Mihai, MD  Allergies:  Allergies  Allergen Reactions   Iodinated Contrast Media Other (See Comments)    Kidney failure.    Ciprofloxacin Itching and Rash   E-Mycin [Erythromycin Base] Nausea And Vomiting   Oxycodone Other (See Comments)    Hallucination.    Past Medical History, Surgical history, Social history, and Family History were reviewed and updated.  Review of  Systems: Review of Systems  Constitutional: Negative.   HENT:  Negative.    Eyes:  Positive for eye problems.  Respiratory: Negative.    Cardiovascular: Negative.   Gastrointestinal: Negative.   Endocrine: Negative.   Genitourinary: Negative.    Musculoskeletal:  Positive for back pain.  Neurological: Negative.   Hematological: Negative.   Psychiatric/Behavioral: Negative.      Physical Exam:  height is 5\' 6"  (1.676 m) and weight is 190 lb (86.2 kg). Her oral temperature is 98 F (36.7 C). Her blood pressure is 122/51 (abnormal) and her pulse is 66. Her respiration is 18 and oxygen saturation is 100%.   Wt Readings from Last 3 Encounters:  10/02/23 190 lb (86.2 kg)  08/30/23 190 lb (86.2 kg)  08/03/23 193 lb (87.5 kg)    Physical Exam Vitals reviewed.  HENT:     Head: Normocephalic and atraumatic.  Eyes:     Pupils: Pupils are equal, round, and reactive to light.     Comments: She has some slight ptosis of the right eye.  Her extraocular muscles are intact.  Cardiovascular:     Rate and Rhythm: Normal rate and regular rhythm.     Heart sounds: Normal heart sounds.  Pulmonary:     Effort: Pulmonary effort is normal.     Breath sounds: Normal breath sounds.  Abdominal:     General: Bowel sounds are normal.     Palpations: Abdomen is soft.  Musculoskeletal:        General: No tenderness or deformity. Normal range of motion.     Cervical back: Normal range of motion.  Lymphadenopathy:     Cervical: No cervical adenopathy.  Skin:    General: Skin is warm and dry.     Findings: No erythema or rash.  Neurological:     Mental Status: She is alert and oriented to person, place, and time.  Psychiatric:        Behavior: Behavior normal.        Thought Content: Thought content normal.        Judgment: Judgment normal.      Lab Results  Component Value Date   WBC 5.9 10/02/2023   HGB 12.3 10/02/2023   HCT 37.7 10/02/2023   MCV 98.4 10/02/2023   PLT 144 (L)  10/02/2023     Chemistry      Component Value Date/Time   NA 144 10/02/2023 1027   NA 144 04/25/2023 1052   NA 142 05/18/2015 1257   K 3.7 10/02/2023 1027   K 3.6 05/18/2015 1257   CL 106 10/02/2023 1027   CL 104 01/15/2013 0844   CO2 29 10/02/2023 1027   CO2 26 05/18/2015 1257   BUN 14 10/02/2023 1027   BUN 12 04/25/2023  1052   BUN 12.6 05/18/2015 1257   CREATININE 1.15 (H) 10/02/2023 1027   CREATININE 0.8 05/18/2015 1257      Component Value Date/Time   CALCIUM 9.2 10/02/2023 1027   CALCIUM 9.1 05/18/2015 1257   ALKPHOS 117 10/02/2023 1027   ALKPHOS 122 05/18/2015 1257   AST 18 10/02/2023 1027   AST 17 05/18/2015 1257   ALT 12 10/02/2023 1027   ALT 13 05/18/2015 1257   BILITOT 0.7 10/02/2023 1027   BILITOT 0.72 05/18/2015 1257      Impression and Plan: [name] is a very nice 73 year old African-American female.  We had seen her in the remote past with a lymphoplasmacytic lymphoma.  She was treated for this back in March 2010.  She has been in remission since.  I am still not sure as to why she does not feel all that great.  Her blood counts certainly are not all that bad.  We we will check her iron again.  I suppose that 1 possibility is for another bone marrow biopsy.  She had 1 done actually back in January 2024.  Everything looked fine on that bone marrow.  Again, I do not see anything is obvious with respect to her lab work that would be explaining her fatigue and weakness.  We still have to watch her closely.  It will be interesting to see what the Neurologist has to say.  I would like to see her back in another couple months.   Josph Macho, MD 3/31/202511:38 AM

## 2023-10-02 NOTE — Addendum Note (Signed)
 Addended by: Arlan Organ R on: 10/02/2023 11:58 AM   Modules accepted: Orders

## 2023-10-03 LAB — KAPPA/LAMBDA LIGHT CHAINS
Kappa free light chain: 17.5 mg/L (ref 3.3–19.4)
Kappa, lambda light chain ratio: 0.96 (ref 0.26–1.65)
Lambda free light chains: 18.3 mg/L (ref 5.7–26.3)

## 2023-10-03 LAB — IGG, IGA, IGM
IgA: 50 mg/dL — ABNORMAL LOW (ref 64–422)
IgG (Immunoglobin G), Serum: 1039 mg/dL (ref 586–1602)
IgM (Immunoglobulin M), Srm: <5 mg/dL — ABNORMAL LOW (ref 26–217)

## 2023-10-06 LAB — IMMUNOFIXATION REFLEX, SERUM
IgA: 44 mg/dL — ABNORMAL LOW (ref 64–422)
IgG (Immunoglobin G), Serum: 1028 mg/dL (ref 586–1602)
IgM (Immunoglobulin M), Srm: <5 mg/dL — ABNORMAL LOW (ref 26–217)

## 2023-10-06 LAB — PROTEIN ELECTROPHORESIS, SERUM, WITH REFLEX
A/G Ratio: 1.1 (ref 0.7–1.7)
Albumin ELP: 3.5 g/dL (ref 2.9–4.4)
Alpha-1-Globulin: 0.3 g/dL (ref 0.0–0.4)
Alpha-2-Globulin: 1.1 g/dL — ABNORMAL HIGH (ref 0.4–1.0)
Beta Globulin: 0.8 g/dL (ref 0.7–1.3)
Gamma Globulin: 0.9 g/dL (ref 0.4–1.8)
Globulin, Total: 3.1 g/dL (ref 2.2–3.9)
M-Spike, %: 0.4 g/dL — ABNORMAL HIGH
SPEP Interpretation: 0
Total Protein ELP: 6.6 g/dL (ref 6.0–8.5)

## 2023-10-09 ENCOUNTER — Encounter: Payer: Self-pay | Admitting: Neurology

## 2023-10-09 ENCOUNTER — Ambulatory Visit: Payer: Medicare Other | Admitting: Neurology

## 2023-10-09 VITALS — BP 125/70 | HR 74 | Ht 66.0 in | Wt 194.8 lb

## 2023-10-09 DIAGNOSIS — R933 Abnormal findings on diagnostic imaging of other parts of digestive tract: Secondary | ICD-10-CM | POA: Insufficient documentation

## 2023-10-09 DIAGNOSIS — R269 Unspecified abnormalities of gait and mobility: Secondary | ICD-10-CM | POA: Diagnosis not present

## 2023-10-09 DIAGNOSIS — R110 Nausea: Secondary | ICD-10-CM | POA: Insufficient documentation

## 2023-10-09 DIAGNOSIS — K580 Irritable bowel syndrome with diarrhea: Secondary | ICD-10-CM | POA: Insufficient documentation

## 2023-10-09 DIAGNOSIS — R200 Anesthesia of skin: Secondary | ICD-10-CM | POA: Diagnosis not present

## 2023-10-09 DIAGNOSIS — M51369 Other intervertebral disc degeneration, lumbar region without mention of lumbar back pain or lower extremity pain: Secondary | ICD-10-CM

## 2023-10-09 DIAGNOSIS — R159 Full incontinence of feces: Secondary | ICD-10-CM | POA: Insufficient documentation

## 2023-10-09 DIAGNOSIS — R2689 Other abnormalities of gait and mobility: Secondary | ICD-10-CM | POA: Diagnosis not present

## 2023-10-09 DIAGNOSIS — Z9181 History of falling: Secondary | ICD-10-CM

## 2023-10-09 DIAGNOSIS — M4802 Spinal stenosis, cervical region: Secondary | ICD-10-CM | POA: Diagnosis not present

## 2023-10-09 DIAGNOSIS — K5730 Diverticulosis of large intestine without perforation or abscess without bleeding: Secondary | ICD-10-CM | POA: Insufficient documentation

## 2023-10-09 DIAGNOSIS — R1032 Left lower quadrant pain: Secondary | ICD-10-CM | POA: Insufficient documentation

## 2023-10-09 DIAGNOSIS — R197 Diarrhea, unspecified: Secondary | ICD-10-CM | POA: Insufficient documentation

## 2023-10-09 DIAGNOSIS — R141 Gas pain: Secondary | ICD-10-CM | POA: Insufficient documentation

## 2023-10-09 DIAGNOSIS — R7303 Prediabetes: Secondary | ICD-10-CM | POA: Diagnosis not present

## 2023-10-09 DIAGNOSIS — Z1211 Encounter for screening for malignant neoplasm of colon: Secondary | ICD-10-CM | POA: Insufficient documentation

## 2023-10-09 DIAGNOSIS — K2100 Gastro-esophageal reflux disease with esophagitis, without bleeding: Secondary | ICD-10-CM | POA: Insufficient documentation

## 2023-10-09 DIAGNOSIS — R140 Abdominal distension (gaseous): Secondary | ICD-10-CM | POA: Insufficient documentation

## 2023-10-09 DIAGNOSIS — R011 Cardiac murmur, unspecified: Secondary | ICD-10-CM

## 2023-10-09 DIAGNOSIS — I10 Essential (primary) hypertension: Secondary | ICD-10-CM | POA: Diagnosis not present

## 2023-10-09 DIAGNOSIS — R194 Change in bowel habit: Secondary | ICD-10-CM | POA: Insufficient documentation

## 2023-10-09 NOTE — Patient Instructions (Addendum)
  You may have a condition called peripheral neuropathy, i. e. nerve damage. Unfortunately, as I mentioned, there is no specific treatment for most neuropathies. The most common cause for neuropathy is diabetes in this [place], in which case, tight glucose control is key.  Some studies suggest that obesity and prediabetes can also cause nerve damage even in the absence of a formal diagnosis of diabetes.    Other causes include thyroid disease, and some vitamin deficiencies. Certain medications such as chemotherapy agents and other chemicals or toxins including alcohol can cause neuropathy. There are some genetic conditions or hereditary neuropathies. Typically patients will report a family history of neuropathy in those conditions. There are cases associated with cancers and autoimmune conditions. Most neuropathies are progressive unless a root cause can be found and treated, which is rare, as I explained. For most neuropathies there is no actual cure or reversing of symptoms. Painful neuropathy can be difficult to treat symptomatically, but there are some medications available to ease the symptoms.  Thankfully, you have no significant pain symptoms at this time and can be monitored for symptoms.    Electrophysiologic testing with nerve conduction velocity studies and EMG (muscle testing) do not always pick up neuropathies that affect the smallest fibers. Other common tests include different type of blood work, and rarely, spinal fluid testing, and sometimes we resort to asking for a nerve and muscle biopsy.  For now, as discussed, we will proceed with further work-up from my end of things: We will check blood work today and call you with the test results.  We will do an EMG and nerve conduction velocity test, which is an electrical nerve and muscle test, which we will schedule. We will call you with the results.  I recommend you start using a walker for gait safety, I am not sure that you are fully safe  to just use a cane any longer.  Please talk to your cardiologist about the heart murmur noticed today.  I recommend you reconsider physical therapy as recommended by your primary care.

## 2023-10-09 NOTE — Progress Notes (Signed)
 Subjective:    Patient ID: [name] is a 73 y.o. female.  HPI    Erica Foley, MD, PhD [name] [address], [address] [address] [place], [place] [address]  Dear Dr. Nicholos [name],  I saw your patient, [name], upon your kind request in my neurologic clinic today for evaluation of her leg weakness, concern for peripheral neuropathy.  The patient is unaccompanied today.  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  Symptoms have progressed over time.  She has had some numbness in her legs and associated pain in the past several months.  She has sometimes an aching sensation in her feet and sometimes sharp and shooting pains in her legs and even in her arms at times.  She has had some numbness in her right arm from the elbow down or sometimes in just the hand especially when opening onto the steering wheel or when writing.  She has had physical therapy twice for her balance and does not believe it helped.  She can bear the pain on most days.  She usually massages her legs and soaks them in Epsom salts which helps.  She walks with a cane.  She quit smoking over 30 years ago.  She does not currently drink any alcohol, has not had any in about 2 years, previously drank beer daily, 1 or 2/day on average.  She limits her caffeine to 2 cups of coffee and occasional soda.  She hydrates well with water, estimates that she drinks about 64 ounces of water per day.  Of note, she had low back surgery under Dr. Yevette [name] and also had both hips  replaced.  I reviewed your office note from 06/20/2023.  She declined a referral to physical therapy at the time.  She reported frequent falls.  She had blood work through your office including CBC with platelets, CMP, A1c, lipid panel, TSH, and she had a urinalysis.  I reviewed blood test results from 06/13/2023: CMP showed mildly elevated alk phos at 157, otherwise unremarkable, lipid panel showed LDL of 71, A1c was 5.4, CRP from 02/14/2023 was 10.  ESR was 22 at the time.  She sees rheumatology for her lupus. I reviewed her blood test results from January and March 2025, she had extensive blood work through oncology.  She sees hematology and oncology for anemia and also Hx of lymphoma.  I reviewed the office visit note from 10/02/2023.  She recently saw her cardiologist in February 2025.  I reviewed the office visit note from 08/30/2023.  She is not noticed to have a murmur at the time.  That evaluated her several years ago for dizziness and balance issues.  She had a baseline sleep study through our office on 03/16/2019, which did not show any significant sleep disordered breathing.  She had a brain MRI with and without contrast on 04/03/2019 and I reviewed the results:   IMPRESSION:    MRI brain (with and without) demonstrating: - Mild perisylvian atrophy. Mild chronic small vessel ischemic disease.  - No acute findings.   In addition, I personally and independently reviewed images through the PACS system.  Previously:  02/28/2019: 73 year old right-handed woman with an underlying medical history of asthma, arthritis, status post left shoulder replacement in February 2020, Sjogren's syndrome, asthma, history of lymphoma in remission, low back pain, acid reflux, hypertension, lupus, history of stroke, and obesity, who reports intermittent episodes of lightheadedness, feeling off balance, like she is going to fall.  She has had these pre-syncopal spells since January.  She had some work-up for  this, she had seen cardiology in the past.  She had a 2-week Holter monitor which showed mostly benign findings, some sinus tachycardia per your records.  She denies any actual spinning sensation.  She feels like she may veer backwards or forwards, sometimes it feels like a sway back-and-forth.  She has not actually fallen but one time last month she was veering backwards and was caught by her niece.  She walks with a cane for balance.  She has arthritis.  She has right more than left knee pain.  She has arthritis in her hands as well.  She denies any associated neurological symptoms such as one-sided weakness or numbness or tingling or droopy face or slurring of speech.  With her prior stroke in the distant past she had some residual left leg weakness, sometimes when she is fatigued she can feel it more.  She has no nausea or vomiting and no migrainous headaches but has had intermittent sharp shooting pains in the right side of her head, parasagittal area, lasting perhaps 10 seconds at a time.   She lives with her husband.  She does not sleep very well.  She goes to sleep but wakes up multiple times in the night.  She tries to be in bed around 11.  She wakes up a couple of hours later.  Rise time is variable, mostly around 9 AM.  She does not have night to night nocturia or morning headaches.  She is familiar with the diagnosis of sleep apnea as her husband has a CPAP machine.  She does snore intermittently.  She does not wake up rested, Epworth sleepiness score is 4 out of 24, fatigue severity score is 24 out of 63.  She has had some chest discomfort.  She quit smoking some 20 years ago and drinks alcohol rarely, caffeine in the form of 1 Pepsi per day and decaf tea 1 to 2 cups.  She reports having had a reaction with kidney failure in the past when she took MRI contrast.  She would be willing to consider a brain MRI with contrast but is reluctant at this time, her BUN and creatinine were normal when she had her  blood work on 02/01/2019.  She remembers seeing a kidney specialist some 10 years ago. I reviewed your office note from 02/22/2019 which you kindly included as well as 02/01/2019.  She had recent lab work in your office including magnesium which was 1.7, CBC with differential unremarkable with the exception of borderline low RBC of 4.0, comprehensive metabolic profile showed glucose of 102, BUN 11, creatinine 0.9, potassium was slightly low at 3.3, TSH normal at 2.86. She had a brain MRI without contrast on 11/28/2013 and I reviewed the results: IMPRESSION: Mild chronic microvascular ischemia.  No acute abnormality. She had a chronic infarct in the right external capsule and small chronic infarct in the central pons, the indication was history of lymphoma and vertigo at the time.      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 2022 - right   MASS EXCISION     back   REPLACEMENT TOTAL HIP W/  RESURFACING IMPLANTS Left 09/2022   rortator     rotator cuff   TOTAL SHOULDER ARTHROPLASTY Left 08/16/2018   Procedure: LEFT SHOULDER REVERSE TOTAL SHOULDER ARTHROPLASTY AND BICEPS TENOTOMY;  Surgeon: Jones Broom, MD;   Location: [facility] OR;  Service: Orthopedics;  Laterality: Left;    Her Family History Is Significant For: Family History  Problem Relation Age of Onset   Lung disease Father    Coronary artery disease Brother    Breast cancer Neg Hx     Her Social History Is Significant For: Social History   Socioeconomic History   Marital status: Married    Spouse name: Not on file   Number of children: Not on file   Years of education: Not on file   Highest education level: Not on file  Occupational History   Not on file  Tobacco Use   Smoking status: Former    Current packs/day: 0.00    Average packs/day: 1 pack/day for 10.0 years (10.0 ttl pk-yrs)    Types: Cigarettes    Start date: 06/17/1984    Quit date: 06/17/1994    Years since quitting: 29.3    Passive exposure: Past   Smokeless tobacco: Never  Vaping Use   Vaping status: Never Used  Substance and Sexual Activity   Alcohol use: Not Currently    Alcohol/week: 0.0 standard drinks of alcohol   Drug use: No   Sexual activity: Not Currently  Other Topics Concern   Not on file  Social History Narrative   Not on file   Social Drivers of Health   Financial Resource Strain: Not on file  Food Insecurity: Not on file  Transportation Needs: Not on file  Physical Activity: Not on file  Stress: Not on file  Social Connections: Not on file    Her Allergies Are:  Allergies  Allergen Reactions   Iodinated Contrast Media Other (See Comments)    Kidney failure.    Ciprofloxacin Itching and Rash   E-Mycin [Erythromycin Base] Nausea And Vomiting   Oxycodone Other (See Comments)    Hallucination.  :   Her Current Medications Are:  Outpatient Encounter Medications as of 10/09/2023  Medication Sig   acetaminophen (TYLENOL) 500 MG tablet Take 1,000 mg by mouth every 6 (six) hours as needed for mild pain or headache.   albuterol (VENTOLIN HFA) 108 (90 Base) MCG/ACT inhaler Inhale 2 puffs into the lungs every 6 (six) hours as needed for  wheezing or shortness of breath.   aspirin 81 MG chewable tablet  Chew by mouth daily.   cholecalciferol (VITAMIN D) 1000 UNITS tablet Take 1,000 Units by mouth daily.    fluorometholone (EFLONE) 0.1 % ophthalmic suspension Place 2 drops into the right eye 2 (two) times daily.   fluticasone-salmeterol (ADVAIR HFA) 230-21 MCG/ACT inhaler Inhale 2 puffs into the lungs 2 (two) times daily.   furosemide (LASIX) 80 MG tablet Take by mouth.   hydroxychloroquine (PLAQUENIL) 200 MG tablet Take 200 mg by mouth daily.   latanoprost (XALATAN) 0.005 % ophthalmic solution Place 1 drop into both eyes at bedtime.   losartan (COZAAR) 50 MG tablet Take 1 tablet (50 mg total) by mouth daily.   pantoprazole (PROTONIX) 40 MG tablet Take 40 mg by mouth daily.   potassium chloride SA (KLOR-CON M) 20 MEQ tablet Take 1 tablet (20 mEq total) by mouth 2 (two) times daily. 2 in the morning and 1 in the evening (Patient taking differently: Take 20 mEq by mouth 2 (two) times daily. 06/15/2023 1 in the morning and 1 in the evening)   rosuvastatin (CRESTOR) 20 MG tablet Take 1 tablet (20 mg total) by mouth daily.   Spacer/Aero-Holding Chambers (AEROCHAMBER MV) inhaler Use as instructed   torsemide (DEMADEX) 20 MG tablet Take 3 tablets by mouth once daily   traZODone (DESYREL) 50 MG tablet Take 1 tablet (50 mg total) by mouth at bedtime as needed for sleep.   Turmeric 500 MG CAPS Take by mouth daily.   valACYclovir (VALTREX) 1000 MG tablet Take 1,000 mg by mouth 2 (two) times daily as needed.   cycloSPORINE, PF, (CEQUA) 0.09 % SOLN Apply to eye. (Patient not taking: Reported on 10/09/2023)   losartan (COZAAR) 100 MG tablet Take 100 mg by mouth daily. (Patient not taking: Reported on 10/09/2023)   spironolactone (ALDACTONE) 25 MG tablet Take 0.5 tablets (12.5 mg total) by mouth daily.   torsemide (DEMADEX) 20 MG tablet Take 2 tablets by mouth daily. (Patient not taking: Reported on 10/09/2023)   Facility-Administered Encounter  Medications as of 10/09/2023  Medication   lidocaine-EPINEPHrine (XYLOCAINE W/EPI) 1 %-1:100000 (with pres) injection 10 mL   lidocaine-EPINEPHrine (XYLOCAINE W/EPI) 1 %-1:100000 (with pres) injection 15 mL  :   Review of Systems:  Out of a complete 14 point review of systems, all are reviewed and negative with the exception of these symptoms as listed below:   Review of Systems  Neurological:        Room 5 Pt is here Alone. 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 Pt states that her right hand will get numb. Pt states that her feet and hands stay cold all of the time. Pt states that her feet stay numb. Pt states that her last fall was Jan 22nd and she did hit her head, but didn't go to the hospital. Pt states that her ears ring and she has dizzy spells like her head is spinning and she gets real hot. Pt states that if she drinks water it helps with her spells. Pt states states that her balance is off as well. Pt states that if she stands up for a period of time she feels herself about to fall so she will step back to try to catch herself.     Objective:  Neurological Exam  Physical Exam Physical Examination:   Vitals:   10/09/23 0811  BP: 125/70  Pulse: 74    General Examination: The  patient is a very pleasant 73 y.o. female in no acute distress. She appears well-developed and well-nourished and well groomed.   HEENT: Normocephalic, atraumatic, pupils are equal, round and reactive to light and accommodation. She wears corrective eyeglasses.  Extraocular tracking is good, no nystagmus is seen.  Hearing is grossly intact but she reports hearing loss on the left side.  Face is symmetric, no facial abnormal movements, normal facial sensation to light touch, pinprick, temperature and vibration sense.  Speech is clear without dysarthria or hypophonia.  Neck is supple.  Airway examination reveals mild airway  crowding, moderate mouth dryness.     Chest: Clear to auscultation without wheezing, rhonchi or crackles noted.   Heart: S1+S2+0, regular, 2/6 systolic murmur noted.     Abdomen: Soft, non-tender and non-distended with normal bowel sounds appreciated on auscultation.   Extremities: There is no pitting edema in the distal lower extremities bilaterally. Pedal pulses are intact.   Skin: Warm and dry without trophic changes noted.   Musculoskeletal: exam reveals prominent arthritic changes in both hands, right more than left, bilateral decreased range of motion in both hips.    Neurologically:  Mental status: The patient is awake, alert and oriented in all 4 spheres. Her immediate and remote memory, attention, language skills and fund of knowledge are appropriate. There is no evidence of aphasia, agnosia, apraxia or anomia. Speech is clear with normal prosody and enunciation. Thought process is linear. Mood is normal and affect is normal.  Cranial nerves II - XII are as described above under HEENT exam. In addition: shoulder shrug is normal with equal shoulder height noted. Motor exam: Normal bulk, strength and tone is noted with the exception of mild hip flexor weakness bilaterally.  No focal atrophy, no obvious fasciculations in the distal lower extremities or upper extremities bilaterally.  Romberg is not tested for safety concerns.  Reflexes are 1+ in the upper extremities, trace in the knees and absent in both ankles.  Babinski: Toes are flexor bilaterally. Fine motor skills and coordination: intact in the upper and lower extremities.   Cerebellar testing: No dysmetria or intention tremor. There is no truncal or gait ataxia.  Sensory exam: intact to light touch in the upper and lower extremities, but decreased pinprick sensation in both feet up to the ankles and vibration decreased in both lower extremities up to the ankles.  Temperature sense decreased in both lower extremities up to above  ankle area.  Upper extremities normal sensation.  Gait, station and balance: She stands with mild difficulty and pushes herself up.  She stands wide-based.  Posture is age-appropriate.  She walks cautiously and slowly, turns slowly, no shuffling, she uses a multipronged cane.    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  She has had numbness for several years, pain in the past several months.  She has fallen, she uses a cane.  I had a long discussion with the patient regarding possible neuropathy, examination is in keeping with peripheral neuropathy but complicating factors are arthritis and joint replacements as well as back surgery.  Autoimmune related neuropathy is very possible in her  case as she has a history of lupus.  She previously consumed alcohol daily in the form of beer, stopped about 2 years ago, this can be a contributing factor as well.  She has regular checkup and blood work through hematology and rheumatology but I would like to add a few things today.  We will also go ahead and schedule her for an EMG and nerve conduction velocity test through our office on a separate date.  I noticed a systolic heart murmur today and she is advised to talk to her cardiologist about it as they did not notice a heart murmur at the exam in February 2025.  She is advised to stay well-hydrated and start using a walker for gait safety.  She is encouraged to consider physical therapy as you recommended.  We will keep her posted as to her test results by  phone call for now.  Will plan to follow-up accordingly.  This was an extended visit of over 70 minutes with copious record review involved including paper chart review, lab test results review in her electronic and paper charts and record review from her other specialists, addressing multiple issues and considerable counseling and coordination of care involved.  Thank you very much for allowing me to participate in the care of this nice patient. If I can be of any further assistance to you please do not hesitate to call me at [phone].   Sincerely,     Erica Foley, MD, PhD

## 2023-10-14 LAB — VITAMIN B6: Vitamin B6: 2.8 ug/L — ABNORMAL LOW (ref 3.4–65.2)

## 2023-10-14 LAB — HGB A1C W/O EAG: Hgb A1c MFr Bld: 5.4 % (ref 4.8–5.6)

## 2023-10-14 LAB — CK: Total CK: 175 U/L (ref 32–182)

## 2023-10-14 LAB — VITAMIN B1: Thiamine: 181.4 nmol/L (ref 66.5–200.0)

## 2023-10-14 LAB — RPR: RPR Ser Ql: NONREACTIVE

## 2023-10-17 DIAGNOSIS — H2513 Age-related nuclear cataract, bilateral: Secondary | ICD-10-CM | POA: Diagnosis not present

## 2023-10-18 ENCOUNTER — Telehealth: Payer: Self-pay | Admitting: *Deleted

## 2023-10-18 NOTE — Telephone Encounter (Signed)
-----   Message from Debbra Fairy sent at 10/17/2023  4:35 PM EDT ----- Labs are fine with the exception of mildly below normal vitamin B6.  She can start taking over-the-counter vitamin B6 and follow the instructions on the bottle, she can get it rechecked w PCP in about 3 months.

## 2023-10-18 NOTE — Telephone Encounter (Signed)
 Called pt & discussed lab results and recommendations as noted below by Dr Omar Bibber. Pt understands she can start OTC Vitamin B6 and follow-up with PCP in 3 months for lab recheck. Pt's questions were answered and she verbalized appreciation.

## 2023-10-21 ENCOUNTER — Encounter: Payer: Self-pay | Admitting: *Deleted

## 2023-10-21 ENCOUNTER — Ambulatory Visit
Admission: EM | Admit: 2023-10-21 | Discharge: 2023-10-21 | Disposition: A | Discharge status: Home / Self Care | Attending: Family Medicine | Admitting: Family Medicine

## 2023-10-21 ENCOUNTER — Other Ambulatory Visit: Payer: Self-pay

## 2023-10-21 DIAGNOSIS — J069 Acute upper respiratory infection, unspecified: Secondary | ICD-10-CM | POA: Diagnosis not present

## 2023-10-21 DIAGNOSIS — R0981 Nasal congestion: Secondary | ICD-10-CM | POA: Diagnosis not present

## 2023-10-21 MED ORDER — METHYLPREDNISOLONE ACETATE 80 MG/ML IJ SUSP
40.0000 mg | Freq: Once | INTRAMUSCULAR | Status: AC
  Administered 2023-10-21: 40 mg via INTRAMUSCULAR

## 2023-10-21 MED ORDER — METHYLPREDNISOLONE ACETATE 40 MG/ML IJ SUSP: 40.0000 mg | Freq: Once | INTRAMUSCULAR | Status: DC

## 2023-10-21 NOTE — Discharge Instructions (Signed)
 Meds ordered this encounter  Medications   methylPREDNISolone  acetate (DEPO-MEDROL ) injection 40 mg

## 2023-10-21 NOTE — ED Provider Notes (Signed)
 [facility]   [id number] 10/21/23 Arrival Time: 8295  ASSESSMENT & PLAN:  1. Viral URI with cough   2. Nasal congestion    Discussed typical duration of likely viral illness. This is day #2. OTC symptom care as needed. She really would like trial of IM steroid. Feels it has helped in the past. Given: Meds ordered this encounter  Medications   methylPREDNISolone  acetate (DEPO-MEDROL ) injection 40 mg     Follow-up Information     Virgle Grime, MD.   Specialty: Internal Medicine Why: As needed. Contact information: [address] [place] [address] [phone]         [facility]).   Specialty: Urgent Care Why: If worsening or failing to improve as anticipated. Contact information: [address] [place]  [address] [phone]                Reviewed expectations re: course of current medical issues. Questions answered. Outlined signs and symptoms indicating need for more acute intervention. Understanding verbalized. After Visit Summary given.   SUBJECTIVE: History from: Patient. [name] is a 73 y.o. female. Pt reports concern for sinus infection. She states she had seen some ants in her house on Thursday and sprayed them. After that her sinuses began bothering her and she has had a "hacking cough". States she has cataract surgery scheduled next week and needs to get her symptoms cleared up. Denies fever. Normal PO intake without n/v/d.  OBJECTIVE:  Vitals:   10/21/23 0920  BP: 126/74  Pulse: 73  Resp: 20  Temp: 98.5 F (36.9 C)  TempSrc: Oral  SpO2: 95%    General appearance: alert; no distress Eyes: PERRLA; EOMI; conjunctiva normal HENT: [place]; AT; with nasal congestion Neck: supple  Lungs: speaks full sentences without difficulty; unlabored; CTAB Extremities: no edema Skin: warm and dry Neurologic: normal gait Psychological:  alert and cooperative; normal mood and affect  Labs:  Labs Reviewed - No data to display  Imaging: No results found.  Allergies  Allergen Reactions   Iodinated Contrast Media Other (See Comments)    Kidney failure.    Erythromycin  Base Itching    Red streaks on arms   Ciprofloxacin Itching and Rash   Oxycodone Other (See Comments)    Hallucination.    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   Near syncope 08/30/2019   PONV (postoperative nausea and vomiting)    Pure hypercholesterolemia 05/31/2022   Stroke (HCC)    lt side weakness   Social History   Socioeconomic History   Marital status: Married    Spouse name: Not on file   Number of children: Not on file   Years of education: Not on file   Highest education level: Not on file  Occupational History   Not on file  Tobacco Use   Smoking status: Former    Current packs/day: 0.00    Average packs/day: 1 pack/day for 10.0 years (10.0 ttl pk-yrs)    Types: Cigarettes    Start date: 06/17/1984    Quit date: 06/17/1994    Years since quitting: 29.3    Passive exposure: Past  Smokeless tobacco: Never  Vaping Use   Vaping status: Never Used  Substance and Sexual Activity   Alcohol  use: Not Currently    Alcohol /week: 0.0 standard drinks of alcohol    Drug use: No   Sexual activity: Not Currently  Other Topics Concern   Not on file  Social History Narrative   Not on file   Social Drivers of Health   Financial Resource Strain: Not on file  Food Insecurity: Not on file  Transportation Needs: Not on file  Physical Activity: Not on file  Stress: Not on file  Social Connections: Not on file  Intimate Partner Violence: Not on file    Family History  Problem Relation Age of Onset   Lung disease Father    Coronary artery disease Brother    Breast cancer Neg Hx    Past Surgical History:  Procedure Laterality Date   ABDOMINAL HYSTERECTOMY     BACK SURGERY     CHOLECYSTECTOMY     COLONOSCOPY     EYE SURGERY Right 08/31/2022   FOOT SURGERY     x2   JOINT REPLACEMENT Bilateral 2023   Hip replacements March 2023 - left and Sept. 2022 - right   MASS EXCISION     back   REPLACEMENT TOTAL HIP W/  RESURFACING IMPLANTS Left 09/2022   rortator     rotator cuff   TOTAL SHOULDER ARTHROPLASTY Left 08/16/2018   Procedure: LEFT SHOULDER REVERSE TOTAL SHOULDER ARTHROPLASTY AND BICEPS TENOTOMY;  Surgeon: Sammye Cristal, MD;  Location: [facility] OR;  Service: Orthopedics;  Laterality: Left;     Afton Albright, MD 10/21/23 [phone]

## 2023-10-21 NOTE — ED Triage Notes (Signed)
 Pt reports concern for sinus infection. She states she had seen some ants in her house on Thursday and sprayed them. After that her sinuses began bothering her and she has had a "hacking cough". States she has cataract surgery scheduled next week and needs to get her symptoms cleared up.

## 2023-10-25 DIAGNOSIS — H2513 Age-related nuclear cataract, bilateral: Secondary | ICD-10-CM | POA: Diagnosis not present

## 2023-10-25 DIAGNOSIS — H269 Unspecified cataract: Secondary | ICD-10-CM | POA: Diagnosis not present

## 2023-10-25 DIAGNOSIS — H2511 Age-related nuclear cataract, right eye: Secondary | ICD-10-CM | POA: Diagnosis not present

## 2023-10-26 DIAGNOSIS — H16121 Filamentary keratitis, right eye: Secondary | ICD-10-CM | POA: Diagnosis not present

## 2023-10-26 DIAGNOSIS — H40003 Preglaucoma, unspecified, bilateral: Secondary | ICD-10-CM | POA: Diagnosis not present

## 2023-10-26 DIAGNOSIS — Z961 Presence of intraocular lens: Secondary | ICD-10-CM | POA: Diagnosis not present

## 2023-10-26 DIAGNOSIS — H16229 Keratoconjunctivitis sicca, not specified as Sjogren's, unspecified eye: Secondary | ICD-10-CM | POA: Diagnosis not present

## 2023-10-26 DIAGNOSIS — H1189 Other specified disorders of conjunctiva: Secondary | ICD-10-CM | POA: Diagnosis not present

## 2023-10-26 DIAGNOSIS — H1132 Conjunctival hemorrhage, left eye: Secondary | ICD-10-CM | POA: Diagnosis not present

## 2023-10-26 DIAGNOSIS — Z79899 Other long term (current) drug therapy: Secondary | ICD-10-CM | POA: Diagnosis not present

## 2023-10-26 DIAGNOSIS — Z9841 Cataract extraction status, right eye: Secondary | ICD-10-CM | POA: Diagnosis not present

## 2023-10-26 DIAGNOSIS — E854 Organ-limited amyloidosis: Secondary | ICD-10-CM | POA: Diagnosis not present

## 2023-10-26 DIAGNOSIS — H119 Unspecified disorder of conjunctiva: Secondary | ICD-10-CM | POA: Diagnosis not present

## 2023-10-26 DIAGNOSIS — H2512 Age-related nuclear cataract, left eye: Secondary | ICD-10-CM | POA: Diagnosis not present

## 2023-11-02 DIAGNOSIS — Z79899 Other long term (current) drug therapy: Secondary | ICD-10-CM | POA: Diagnosis not present

## 2023-11-02 DIAGNOSIS — H2512 Age-related nuclear cataract, left eye: Secondary | ICD-10-CM | POA: Diagnosis not present

## 2023-11-02 DIAGNOSIS — H40003 Preglaucoma, unspecified, bilateral: Secondary | ICD-10-CM | POA: Diagnosis not present

## 2023-11-02 DIAGNOSIS — H1189 Other specified disorders of conjunctiva: Secondary | ICD-10-CM | POA: Diagnosis not present

## 2023-11-02 DIAGNOSIS — H16229 Keratoconjunctivitis sicca, not specified as Sjogren's, unspecified eye: Secondary | ICD-10-CM | POA: Diagnosis not present

## 2023-11-02 DIAGNOSIS — Z961 Presence of intraocular lens: Secondary | ICD-10-CM | POA: Diagnosis not present

## 2023-11-02 DIAGNOSIS — E854 Organ-limited amyloidosis: Secondary | ICD-10-CM | POA: Diagnosis not present

## 2023-11-02 DIAGNOSIS — H119 Unspecified disorder of conjunctiva: Secondary | ICD-10-CM | POA: Diagnosis not present

## 2023-11-02 DIAGNOSIS — H1132 Conjunctival hemorrhage, left eye: Secondary | ICD-10-CM | POA: Diagnosis not present

## 2023-11-02 DIAGNOSIS — Z9841 Cataract extraction status, right eye: Secondary | ICD-10-CM | POA: Diagnosis not present

## 2023-11-02 DIAGNOSIS — H16121 Filamentary keratitis, right eye: Secondary | ICD-10-CM | POA: Diagnosis not present

## 2023-11-08 DIAGNOSIS — I6529 Occlusion and stenosis of unspecified carotid artery: Secondary | ICD-10-CM | POA: Diagnosis not present

## 2023-11-08 DIAGNOSIS — M170 Bilateral primary osteoarthritis of knee: Secondary | ICD-10-CM | POA: Diagnosis not present

## 2023-11-08 DIAGNOSIS — M064 Inflammatory polyarthropathy: Secondary | ICD-10-CM | POA: Diagnosis not present

## 2023-11-08 DIAGNOSIS — H2512 Age-related nuclear cataract, left eye: Secondary | ICD-10-CM | POA: Diagnosis not present

## 2023-11-08 DIAGNOSIS — H269 Unspecified cataract: Secondary | ICD-10-CM | POA: Diagnosis not present

## 2023-11-08 DIAGNOSIS — N182 Chronic kidney disease, stage 2 (mild): Secondary | ICD-10-CM | POA: Diagnosis not present

## 2023-11-08 DIAGNOSIS — K219 Gastro-esophageal reflux disease without esophagitis: Secondary | ICD-10-CM | POA: Diagnosis not present

## 2023-11-08 DIAGNOSIS — D696 Thrombocytopenia, unspecified: Secondary | ICD-10-CM | POA: Diagnosis not present

## 2023-11-08 DIAGNOSIS — I129 Hypertensive chronic kidney disease with stage 1 through stage 4 chronic kidney disease, or unspecified chronic kidney disease: Secondary | ICD-10-CM | POA: Diagnosis not present

## 2023-11-09 DIAGNOSIS — Z79899 Other long term (current) drug therapy: Secondary | ICD-10-CM | POA: Diagnosis not present

## 2023-11-09 DIAGNOSIS — H1132 Conjunctival hemorrhage, left eye: Secondary | ICD-10-CM | POA: Diagnosis not present

## 2023-11-09 DIAGNOSIS — E854 Organ-limited amyloidosis: Secondary | ICD-10-CM | POA: Diagnosis not present

## 2023-11-09 DIAGNOSIS — Z961 Presence of intraocular lens: Secondary | ICD-10-CM | POA: Diagnosis not present

## 2023-11-09 DIAGNOSIS — H16229 Keratoconjunctivitis sicca, not specified as Sjogren's, unspecified eye: Secondary | ICD-10-CM | POA: Diagnosis not present

## 2023-11-09 DIAGNOSIS — H40003 Preglaucoma, unspecified, bilateral: Secondary | ICD-10-CM | POA: Diagnosis not present

## 2023-11-09 DIAGNOSIS — H16121 Filamentary keratitis, right eye: Secondary | ICD-10-CM | POA: Diagnosis not present

## 2023-11-09 DIAGNOSIS — H1189 Other specified disorders of conjunctiva: Secondary | ICD-10-CM | POA: Diagnosis not present

## 2023-11-09 DIAGNOSIS — H119 Unspecified disorder of conjunctiva: Secondary | ICD-10-CM | POA: Diagnosis not present

## 2023-11-09 DIAGNOSIS — Z9842 Cataract extraction status, left eye: Secondary | ICD-10-CM | POA: Diagnosis not present

## 2023-11-09 DIAGNOSIS — Z9841 Cataract extraction status, right eye: Secondary | ICD-10-CM | POA: Diagnosis not present

## 2023-11-15 ENCOUNTER — Ambulatory Visit: Admitting: Neurology

## 2023-11-15 ENCOUNTER — Ambulatory Visit: Payer: Self-pay | Admitting: Neurology

## 2023-11-15 DIAGNOSIS — R200 Anesthesia of skin: Secondary | ICD-10-CM | POA: Diagnosis not present

## 2023-11-15 DIAGNOSIS — Z9181 History of falling: Secondary | ICD-10-CM | POA: Diagnosis not present

## 2023-11-15 DIAGNOSIS — R269 Unspecified abnormalities of gait and mobility: Secondary | ICD-10-CM

## 2023-11-15 DIAGNOSIS — R2689 Other abnormalities of gait and mobility: Secondary | ICD-10-CM | POA: Diagnosis not present

## 2023-11-15 DIAGNOSIS — M51369 Other intervertebral disc degeneration, lumbar region without mention of lumbar back pain or lower extremity pain: Secondary | ICD-10-CM

## 2023-11-15 NOTE — Procedures (Signed)
 Full Name: [name] Gender: Female MRN #: [id number] Date of Birth: 06/20/1951    Visit Date: 11/15/2023 10:01 Age: 73 Years Examining Physician: Guyann Leitz Referring Physician: Omar Bibber,  Height: 5 feet 6 inch History: 73 year old female presenting with slow worsening gait abnormality, lower extremity paresthesia  Summary of the Tests: Nerve conduction study: Right superficial peroneal sensory responses were absent.  Right sural sensory response showed significantly decreased snap amplitude.  Right peroneal to EDB motor responses were absent.  Right tibial motor responses showed mildly decreased CMAP amplitude.  Right ulnar sensory response showed mildly decreased snap amplitude.  Right ulnar motor responses were normal.  Right median sensory response showed moderately prolonged peak latency with mildly decreased snap amplitude.  Right median motor responses showed mildly prolonged prolonged distal latency, with well-preserved CMAP amplitude.  Electromyography: Selected needle examinations of bilateral lower extremity muscles, lumbosacral paraspinals; right upper extremity muscles and cervical paraspinal muscles were performed.  There is evidence of mildly chronic neuropathic changes involving bilateral L4-5 S1 myotomes.  There was no spontaneous activity at bilateral lumbosacral paraspinal muscles.  There was no significant abnormality on selected needle examination of right upper extremity and right cervical paraspinal muscles.  Conclusion: This is an abnormal study.  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  In addition, there is evidence of moderate right carpal tunnel syndromes.    -------------------------------  Phebe Brasil. M.D. Ph.D.   [facility] [address], [address] [place], [place] [address] Tel: [phone] Fax:  [phone]  Verbal informed consent was obtained from the patient, patient was informed of potential risk of procedure, including bruising, bleeding, hematoma formation, infection, muscle weakness, muscle pain, numbness, among others.        MNC    Nerve / Sites Muscle Latency Ref. Amplitude Ref. Rel Amp Segments Distance Velocity Ref. Area    ms ms mV mV %  cm m/s m/s mVms  R Median - APB     Wrist APB 4.7 <=4.4 5.4 >=4.0 100 Wrist - APB 7   20.8     Upper arm APB 9.1  5.7  106 Upper arm - Wrist 22 50 >=49 22.3  R Ulnar - ADM     Wrist ADM 2.7 <=3.3 9.6 >=6.0 100 Wrist - ADM 7   34.2     B.Elbow ADM 5.0  9.2  96.5 B.Elbow - Wrist 14 59 >=49 34.8     A.Elbow ADM 7.6  8.8  95 A.Elbow - B.Elbow 15 58 >=49 35.3  R Peroneal - EDB     Ankle EDB NR <=6.5 NR >=2.0 NR Ankle - EDB 9   NR         Pop fossa - Ankle      R Tibial - AH     Ankle AH 4.0 <=5.8 3.6 >=4.0 100 Ankle - AH 9   5.9     Pop fossa AH 14.6  2.7  75.8 Pop fossa - Ankle 46 43 >=41 7.6             SNC    Nerve / Sites Rec. Site Peak Lat Ref.  Amp Ref. Segments Distance    ms ms V V  cm  R Radial - Anatomical snuff box (Forearm)     Forearm Wrist 2.3 <=2.9 15 >=15 Forearm - Wrist 10  R Sural - Ankle (Calf)  Calf Ankle 4.3 <=4.4 2 >=6 Calf - Ankle 14  R Superficial peroneal - Ankle     Lat leg Ankle NR <=4.4 NR >=6 Lat leg - Ankle 14  R Median - Orthodromic (Dig II, Mid palm)     Dig II Wrist 4.5 <=3.4 8 >=10 Dig II - Wrist 13  R Ulnar - Orthodromic, (Dig V, Mid palm)     Dig V Wrist 3.0 <=3.1 3 >=5 Dig V - Wrist 89               F  Wave    Nerve F Lat Ref.   ms ms  R Ulnar - ADM 28.5 <=32.0  R Tibial - AH 58.8 <=56.0         EMG Summary Table    Spontaneous MUAP Recruitment  Muscle IA Fib PSW Fasc Other Amp Dur. Poly Pattern  R. Tibialis anterior Normal None None None _______ Normal Normal Normal Reduced  R. Tibialis posterior Normal None None None _______ Normal Normal Normal Reduced  R. Gastrocnemius  (Medial head) Normal None None None _______ Normal Normal Normal Reduced  R. Peroneus longus Normal None None None _______ Normal Normal Normal Reduced  R. Vastus lateralis Normal None None None _______ Normal Normal Normal Reduced  L. Vastus lateralis Normal None None None _______ Normal Normal Normal Reduced  L. Tibialis anterior Normal None None None _______ Normal Normal Normal Reduced  L. Tibialis posterior Normal None None None _______ Normal Normal Normal Reduced  L. Gastrocnemius (Medial head) Normal None None None _______ Normal Normal Normal Reduced  R. Lumbar paraspinals (low) Normal None None None _______ Normal Normal Normal Normal  R. Lumbar paraspinals (mid) Normal None None None _______ Normal Normal Normal Normal  L. Lumbar paraspinals (low) Normal None None None _______ Normal Normal Normal Normal  L. Lumbar paraspinals (mid) Normal None None None _______ Normal Normal Normal Normal  R. First dorsal interosseous Normal None None None _______ Normal Normal Normal Normal  R. Pronator teres Normal None None None _______ Normal Normal Normal Normal  R. Biceps brachii Normal None None None _______ Normal Normal Normal Normal  R. Deltoid Normal None None None _______ Normal Normal Normal Normal  R. Triceps brachii Normal None None None _______ Normal Normal Normal Normal  R. Extensor digitorum communis Normal None None None _______ Normal Normal Normal Normal  R. Cervical paraspinals Normal None None None _______ Normal Normal Normal Normal
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
## 2023-11-16 ENCOUNTER — Telehealth: Payer: Self-pay | Admitting: Neurology

## 2023-11-16 DIAGNOSIS — Z961 Presence of intraocular lens: Secondary | ICD-10-CM | POA: Diagnosis not present

## 2023-11-16 DIAGNOSIS — Z9842 Cataract extraction status, left eye: Secondary | ICD-10-CM | POA: Diagnosis not present

## 2023-11-16 DIAGNOSIS — Z9841 Cataract extraction status, right eye: Secondary | ICD-10-CM | POA: Diagnosis not present

## 2023-11-16 DIAGNOSIS — H16229 Keratoconjunctivitis sicca, not specified as Sjogren's, unspecified eye: Secondary | ICD-10-CM | POA: Diagnosis not present

## 2023-11-16 DIAGNOSIS — H16121 Filamentary keratitis, right eye: Secondary | ICD-10-CM | POA: Diagnosis not present

## 2023-11-16 DIAGNOSIS — H119 Unspecified disorder of conjunctiva: Secondary | ICD-10-CM | POA: Diagnosis not present

## 2023-11-16 DIAGNOSIS — H40003 Preglaucoma, unspecified, bilateral: Secondary | ICD-10-CM | POA: Diagnosis not present

## 2023-11-16 DIAGNOSIS — H1189 Other specified disorders of conjunctiva: Secondary | ICD-10-CM | POA: Diagnosis not present

## 2023-11-16 DIAGNOSIS — Z79899 Other long term (current) drug therapy: Secondary | ICD-10-CM | POA: Diagnosis not present

## 2023-11-16 DIAGNOSIS — H1132 Conjunctival hemorrhage, left eye: Secondary | ICD-10-CM | POA: Diagnosis not present

## 2023-11-16 DIAGNOSIS — E854 Organ-limited amyloidosis: Secondary | ICD-10-CM | POA: Diagnosis not present

## 2023-11-16 NOTE — Telephone Encounter (Signed)
 [employer] medicare [employer]: [id number] exp. 11/16/23-12/15/23 sent to [facility] [phone]

## 2023-11-20 NOTE — Telephone Encounter (Signed)
 Called pt @ [phone] and LVM (ok per DPR) asking for call back during office hours to discuss EMG results from Dr Omar Bibber.

## 2023-11-20 NOTE — Telephone Encounter (Signed)
-----   Message from Debbra Fairy sent at 11/15/2023  1:28 PM EDT ----- 1. Please advise patient that her recent EMG and nerve conduction velocity test showed evidence of mild neuropathy which is nerve damage.  This can have many reasons, so far we have not found a single cause other than mildly below normal vitamin B6 as we discussed after the blood work.    2. There was also mild evidence of pinched nerve type findings in the lumbar spine which may relate to her prior degenerative disc disease of the lower spine.  She can certainly consider going back to seeing her spine specialist.  I would recommend she use a walking cane or walker for gait safety consistently.  I would recommend close follow-up with her specialist and we can certainly arrange for follow-up in our clinic for recheck on her symptoms in about 6 months.  Certain chronic conditions such as autoimmune diseases can contribute to neuropathy and also prior exposure to toxins or medicines such as chemotherapy.  3. There was also evidence of moderate degree of carpal tunnel on the right side at the wrist.  If she would like a consultation with a hand surgeon, we can certainly facilitate.  Please go ahead and start a referral if she would like a consultation with hand surgery.

## 2023-11-21 NOTE — Telephone Encounter (Signed)
 Pt returned call.  I gave her the results of the [place]/EMG and list of recommendations as per list below.  Pt did not want to see hand surgeon at this time so will hold off on this.  She will have MRI cspine and then will decide relating to see her spine specilaist, (Dr. Jackee Marus).  She appreciated the information.

## 2023-11-21 NOTE — Telephone Encounter (Signed)
-----   Message from Debbra Fairy sent at 11/15/2023  1:28 PM EDT ----- 1. Please advise patient that her recent EMG and nerve conduction velocity test showed evidence of mild neuropathy which is nerve damage.  This can have many reasons, so far we have not found a single cause other than mildly below normal vitamin B6 as we discussed after the blood work.    2. There was also mild evidence of pinched nerve type findings in the lumbar spine which may relate to her prior degenerative disc disease of the lower spine.  She can certainly consider going back to seeing her spine specialist.  I would recommend she use a walking cane or walker for gait safety consistently.  I would recommend close follow-up with her specialist and we can certainly arrange for follow-up in our clinic for recheck on her symptoms in about 6 months.  Certain chronic conditions such as autoimmune diseases can contribute to neuropathy and also prior exposure to toxins or medicines such as chemotherapy.  3. There was also evidence of moderate degree of carpal tunnel on the right side at the wrist.  If she would like a consultation with a hand surgeon, we can certainly facilitate.  Please go ahead and start a referral if she would like a consultation with hand surgery.

## 2023-11-25 ENCOUNTER — Inpatient Hospital Stay (HOSPITAL_COMMUNITY)
Admission: EM | Admit: 2023-11-25 | Discharge: 2023-12-01 | DRG: 202 | Disposition: A | Discharge status: Home / Self Care | Attending: Internal Medicine | Admitting: Internal Medicine

## 2023-11-25 ENCOUNTER — Encounter (HOSPITAL_COMMUNITY): Payer: Self-pay

## 2023-11-25 ENCOUNTER — Other Ambulatory Visit: Payer: Self-pay

## 2023-11-25 ENCOUNTER — Emergency Department (HOSPITAL_COMMUNITY)

## 2023-11-25 DIAGNOSIS — T380X5A Adverse effect of glucocorticoids and synthetic analogues, initial encounter: Secondary | ICD-10-CM | POA: Diagnosis present

## 2023-11-25 DIAGNOSIS — Z881 Allergy status to other antibiotic agents status: Secondary | ICD-10-CM

## 2023-11-25 DIAGNOSIS — B9789 Other viral agents as the cause of diseases classified elsewhere: Secondary | ICD-10-CM | POA: Diagnosis present

## 2023-11-25 DIAGNOSIS — Z7982 Long term (current) use of aspirin: Secondary | ICD-10-CM

## 2023-11-25 DIAGNOSIS — R7989 Other specified abnormal findings of blood chemistry: Secondary | ICD-10-CM

## 2023-11-25 DIAGNOSIS — E7800 Pure hypercholesterolemia, unspecified: Secondary | ICD-10-CM | POA: Diagnosis not present

## 2023-11-25 DIAGNOSIS — Z8249 Family history of ischemic heart disease and other diseases of the circulatory system: Secondary | ICD-10-CM

## 2023-11-25 DIAGNOSIS — E663 Overweight: Secondary | ICD-10-CM | POA: Diagnosis present

## 2023-11-25 DIAGNOSIS — Z87891 Personal history of nicotine dependence: Secondary | ICD-10-CM | POA: Diagnosis not present

## 2023-11-25 DIAGNOSIS — M329 Systemic lupus erythematosus, unspecified: Secondary | ICD-10-CM | POA: Diagnosis present

## 2023-11-25 DIAGNOSIS — Z7951 Long term (current) use of inhaled steroids: Secondary | ICD-10-CM

## 2023-11-25 DIAGNOSIS — Z8673 Personal history of transient ischemic attack (TIA), and cerebral infarction without residual deficits: Secondary | ICD-10-CM | POA: Diagnosis not present

## 2023-11-25 DIAGNOSIS — J4541 Moderate persistent asthma with (acute) exacerbation: Principal | ICD-10-CM | POA: Diagnosis present

## 2023-11-25 DIAGNOSIS — R0602 Shortness of breath: Secondary | ICD-10-CM | POA: Diagnosis not present

## 2023-11-25 DIAGNOSIS — Z96612 Presence of left artificial shoulder joint: Secondary | ICD-10-CM | POA: Diagnosis not present

## 2023-11-25 DIAGNOSIS — Z79899 Other long term (current) drug therapy: Secondary | ICD-10-CM | POA: Diagnosis not present

## 2023-11-25 DIAGNOSIS — K219 Gastro-esophageal reflux disease without esophagitis: Secondary | ICD-10-CM | POA: Diagnosis not present

## 2023-11-25 DIAGNOSIS — D509 Iron deficiency anemia, unspecified: Secondary | ICD-10-CM | POA: Diagnosis present

## 2023-11-25 DIAGNOSIS — E876 Hypokalemia: Secondary | ICD-10-CM | POA: Diagnosis present

## 2023-11-25 DIAGNOSIS — D696 Thrombocytopenia, unspecified: Secondary | ICD-10-CM | POA: Diagnosis not present

## 2023-11-25 DIAGNOSIS — I5032 Chronic diastolic (congestive) heart failure: Secondary | ICD-10-CM | POA: Diagnosis present

## 2023-11-25 DIAGNOSIS — I959 Hypotension, unspecified: Secondary | ICD-10-CM | POA: Diagnosis not present

## 2023-11-25 DIAGNOSIS — Z90710 Acquired absence of both cervix and uterus: Secondary | ICD-10-CM

## 2023-11-25 DIAGNOSIS — G4700 Insomnia, unspecified: Secondary | ICD-10-CM | POA: Diagnosis not present

## 2023-11-25 DIAGNOSIS — C859A Non-Hodgkin lymphoma, unspecified, in remission: Secondary | ICD-10-CM | POA: Diagnosis present

## 2023-11-25 DIAGNOSIS — I110 Hypertensive heart disease with heart failure: Secondary | ICD-10-CM | POA: Diagnosis present

## 2023-11-25 DIAGNOSIS — I2489 Other forms of acute ischemic heart disease: Secondary | ICD-10-CM | POA: Diagnosis present

## 2023-11-25 DIAGNOSIS — Z6831 Body mass index (BMI) 31.0-31.9, adult: Secondary | ICD-10-CM

## 2023-11-25 DIAGNOSIS — Z885 Allergy status to narcotic agent status: Secondary | ICD-10-CM

## 2023-11-25 DIAGNOSIS — M069 Rheumatoid arthritis, unspecified: Secondary | ICD-10-CM | POA: Diagnosis not present

## 2023-11-25 DIAGNOSIS — Z1152 Encounter for screening for COVID-19: Secondary | ICD-10-CM | POA: Diagnosis not present

## 2023-11-25 DIAGNOSIS — D72828 Other elevated white blood cell count: Secondary | ICD-10-CM | POA: Diagnosis present

## 2023-11-25 DIAGNOSIS — R778 Other specified abnormalities of plasma proteins: Secondary | ICD-10-CM | POA: Diagnosis not present

## 2023-11-25 DIAGNOSIS — L930 Discoid lupus erythematosus: Secondary | ICD-10-CM | POA: Diagnosis present

## 2023-11-25 DIAGNOSIS — J45901 Unspecified asthma with (acute) exacerbation: Secondary | ICD-10-CM | POA: Diagnosis not present

## 2023-11-25 DIAGNOSIS — I10 Essential (primary) hypertension: Secondary | ICD-10-CM | POA: Diagnosis present

## 2023-11-25 DIAGNOSIS — Z91041 Radiographic dye allergy status: Secondary | ICD-10-CM

## 2023-11-25 DIAGNOSIS — R0600 Dyspnea, unspecified: Secondary | ICD-10-CM | POA: Diagnosis not present

## 2023-11-25 DIAGNOSIS — R0989 Other specified symptoms and signs involving the circulatory and respiratory systems: Secondary | ICD-10-CM | POA: Diagnosis not present

## 2023-11-25 LAB — CBC WITH DIFFERENTIAL/PLATELET
Abs Immature Granulocytes: 0.04 10*3/uL (ref 0.00–0.07)
Basophils Absolute: 0 10*3/uL (ref 0.0–0.1)
Basophils Relative: 0 %
Eosinophils Absolute: 0.1 10*3/uL (ref 0.0–0.5)
Eosinophils Relative: 1 %
HCT: 35.2 % — ABNORMAL LOW (ref 36.0–46.0)
Hemoglobin: 11.3 g/dL — ABNORMAL LOW (ref 12.0–15.0)
Immature Granulocytes: 0 %
Lymphocytes Relative: 13 %
Lymphs Abs: 1.5 10*3/uL (ref 0.7–4.0)
MCH: 32.2 pg (ref 26.0–34.0)
MCHC: 32.1 g/dL (ref 30.0–36.0)
MCV: 100.3 fL — ABNORMAL HIGH (ref 80.0–100.0)
Monocytes Absolute: 0.3 10*3/uL (ref 0.1–1.0)
Monocytes Relative: 3 %
Neutro Abs: 8.9 10*3/uL — ABNORMAL HIGH (ref 1.7–7.7)
Neutrophils Relative %: 83 %
Platelets: 143 10*3/uL — ABNORMAL LOW (ref 150–400)
RBC: 3.51 MIL/uL — ABNORMAL LOW (ref 3.87–5.11)
RDW: 12.8 % (ref 11.5–15.5)
WBC: 10.8 10*3/uL — ABNORMAL HIGH (ref 4.0–10.5)
nRBC: 0 % (ref 0.0–0.2)

## 2023-11-25 LAB — TROPONIN I (HIGH SENSITIVITY)
Troponin I (High Sensitivity): 38 ng/L — ABNORMAL HIGH (ref <18)
Troponin I (High Sensitivity): 43 ng/L — ABNORMAL HIGH (ref <18)

## 2023-11-25 LAB — RESP PANEL BY RT-PCR (RSV, FLU A&B, COVID)  RVPGX2
Influenza A by PCR: NEGATIVE
Influenza B by PCR: NEGATIVE
Resp Syncytial Virus by PCR: NEGATIVE
SARS Coronavirus 2 by RT PCR: NEGATIVE

## 2023-11-25 LAB — BASIC METABOLIC PANEL WITH GFR
Anion gap: 11 (ref 5–15)
BUN: 11 mg/dL (ref 8–23)
CO2: 22 mmol/L (ref 22–32)
Calcium: 8.6 mg/dL — ABNORMAL LOW (ref 8.9–10.3)
Chloride: 106 mmol/L (ref 98–111)
Creatinine, Ser: 0.94 mg/dL (ref 0.44–1.00)
GFR, Estimated: >60 mL/min (ref >60)
Glucose, Bld: 158 mg/dL — ABNORMAL HIGH (ref 70–99)
Potassium: 3 mmol/L — ABNORMAL LOW (ref 3.5–5.1)
Sodium: 139 mmol/L (ref 135–145)

## 2023-11-25 LAB — BRAIN NATRIURETIC PEPTIDE: B Natriuretic Peptide: 142.7 pg/mL — ABNORMAL HIGH (ref 0.0–100.0)

## 2023-11-25 MED ORDER — TRAZODONE HCL 50 MG PO TABS
50.0000 mg | ORAL_TABLET | Freq: Every evening | ORAL | Status: DC | PRN
  Administered 2023-11-26 – 2023-11-30 (×4): 50 mg via ORAL
  Filled 2023-11-25 (×4): qty 1

## 2023-11-25 MED ORDER — MAGNESIUM SULFATE 2 GM/50ML IV SOLN
2.0000 g | Freq: Once | INTRAVENOUS | Status: AC
  Administered 2023-11-25: 2 g via INTRAVENOUS
  Filled 2023-11-25: qty 50

## 2023-11-25 MED ORDER — ENOXAPARIN SODIUM 40 MG/0.4ML IJ SOSY
40.0000 mg | PREFILLED_SYRINGE | INTRAMUSCULAR | Status: DC
  Administered 2023-11-25 – 2023-11-30 (×6): 40 mg via SUBCUTANEOUS
  Filled 2023-11-25 (×6): qty 0.4

## 2023-11-25 MED ORDER — ONDANSETRON HCL 4 MG PO TABS: 4.0000 mg | ORAL_TABLET | Freq: Four times a day (QID) | ORAL | Status: DC | PRN

## 2023-11-25 MED ORDER — ONDANSETRON HCL 4 MG/2ML IJ SOLN: 4.0000 mg | Freq: Four times a day (QID) | INTRAMUSCULAR | Status: DC | PRN

## 2023-11-25 MED ORDER — LOSARTAN POTASSIUM 50 MG PO TABS
50.0000 mg | ORAL_TABLET | Freq: Every day | ORAL | Status: DC
  Administered 2023-11-26 – 2023-11-29 (×4): 50 mg via ORAL
  Filled 2023-11-25 (×4): qty 1

## 2023-11-25 MED ORDER — SPIRONOLACTONE 12.5 MG HALF TABLET
12.5000 mg | ORAL_TABLET | Freq: Every day | ORAL | Status: DC
  Administered 2023-11-26 – 2023-12-01 (×6): 12.5 mg via ORAL
  Filled 2023-11-25 (×6): qty 1

## 2023-11-25 MED ORDER — ASPIRIN 81 MG PO CHEW
81.0000 mg | CHEWABLE_TABLET | Freq: Every day | ORAL | Status: DC
Start: 2023-11-26 — End: 2023-12-01
  Administered 2023-11-26 – 2023-12-01 (×6): 81 mg via ORAL
  Filled 2023-11-25 (×6): qty 1

## 2023-11-25 MED ORDER — ACETAMINOPHEN 650 MG RE SUPP: 650.0000 mg | Freq: Four times a day (QID) | RECTAL | Status: DC | PRN

## 2023-11-25 MED ORDER — METHYLPREDNISOLONE SODIUM SUCC 125 MG IJ SOLR
125.0000 mg | Freq: Once | INTRAMUSCULAR | Status: AC
  Administered 2023-11-25: 125 mg via INTRAVENOUS
  Filled 2023-11-25: qty 2

## 2023-11-25 MED ORDER — BISACODYL 5 MG PO TBEC: 5.0000 mg | DELAYED_RELEASE_TABLET | Freq: Every day | ORAL | Status: DC | PRN

## 2023-11-25 MED ORDER — FUROSEMIDE 10 MG/ML IJ SOLN
40.0000 mg | Freq: Once | INTRAMUSCULAR | Status: AC
  Administered 2023-11-25: 40 mg via INTRAVENOUS
  Filled 2023-11-25: qty 4

## 2023-11-25 MED ORDER — BUDESONIDE 0.25 MG/2ML IN SUSP
0.2500 mg | Freq: Two times a day (BID) | RESPIRATORY_TRACT | Status: DC
  Administered 2023-11-25 – 2023-12-01 (×12): 0.25 mg via RESPIRATORY_TRACT
  Filled 2023-11-25 (×13): qty 2

## 2023-11-25 MED ORDER — SODIUM CHLORIDE 0.9% FLUSH
3.0000 mL | Freq: Two times a day (BID) | INTRAVENOUS | Status: DC
  Administered 2023-11-26 – 2023-12-01 (×11): 3 mL via INTRAVENOUS

## 2023-11-25 MED ORDER — TORSEMIDE 20 MG PO TABS
60.0000 mg | ORAL_TABLET | Freq: Every day | ORAL | Status: DC
  Administered 2023-11-26 – 2023-12-01 (×6): 60 mg via ORAL
  Filled 2023-11-25 (×6): qty 3

## 2023-11-25 MED ORDER — SENNOSIDES-DOCUSATE SODIUM 8.6-50 MG PO TABS: 1.0000 | ORAL_TABLET | Freq: Every evening | ORAL | Status: DC | PRN

## 2023-11-25 MED ORDER — PANTOPRAZOLE SODIUM 40 MG PO TBEC
40.0000 mg | DELAYED_RELEASE_TABLET | Freq: Every day | ORAL | Status: DC
  Administered 2023-11-26 – 2023-12-01 (×6): 40 mg via ORAL
  Filled 2023-11-25 (×6): qty 1

## 2023-11-25 MED ORDER — ROSUVASTATIN CALCIUM 10 MG PO TABS
20.0000 mg | ORAL_TABLET | Freq: Every day | ORAL | Status: DC
  Administered 2023-11-26 – 2023-12-01 (×6): 20 mg via ORAL
  Filled 2023-11-25 (×6): qty 2

## 2023-11-25 MED ORDER — IPRATROPIUM-ALBUTEROL 0.5-2.5 (3) MG/3ML IN SOLN
3.0000 mL | Freq: Four times a day (QID) | RESPIRATORY_TRACT | Status: DC | PRN
  Administered 2023-11-26 – 2023-11-28 (×2): 3 mL via RESPIRATORY_TRACT
  Filled 2023-11-25 (×2): qty 3

## 2023-11-25 MED ORDER — ACETAMINOPHEN 325 MG PO TABS: 650.0000 mg | ORAL_TABLET | Freq: Four times a day (QID) | ORAL | Status: DC | PRN

## 2023-11-25 MED ORDER — HYDROXYCHLOROQUINE SULFATE 200 MG PO TABS
200.0000 mg | ORAL_TABLET | Freq: Every day | ORAL | Status: DC
  Administered 2023-11-26 – 2023-12-01 (×6): 200 mg via ORAL
  Filled 2023-11-25 (×6): qty 1

## 2023-11-25 MED ORDER — POTASSIUM CHLORIDE CRYS ER 20 MEQ PO TBCR
40.0000 meq | EXTENDED_RELEASE_TABLET | Freq: Once | ORAL | Status: AC
  Administered 2023-11-25: 40 meq via ORAL
  Filled 2023-11-25: qty 2

## 2023-11-25 MED ORDER — METHYLPREDNISOLONE SODIUM SUCC 40 MG IJ SOLR
40.0000 mg | Freq: Two times a day (BID) | INTRAMUSCULAR | Status: DC
  Administered 2023-11-26 – 2023-11-29 (×7): 40 mg via INTRAVENOUS
  Filled 2023-11-25 (×7): qty 1

## 2023-11-25 MED ORDER — ARFORMOTEROL TARTRATE 15 MCG/2ML IN NEBU
15.0000 ug | INHALATION_SOLUTION | Freq: Two times a day (BID) | RESPIRATORY_TRACT | Status: DC
  Administered 2023-11-25 – 2023-12-01 (×12): 15 ug via RESPIRATORY_TRACT
  Filled 2023-11-25 (×13): qty 2

## 2023-11-25 MED ORDER — POTASSIUM CHLORIDE CRYS ER 20 MEQ PO TBCR
20.0000 meq | EXTENDED_RELEASE_TABLET | Freq: Two times a day (BID) | ORAL | Status: DC
  Administered 2023-11-26 – 2023-12-01 (×11): 20 meq via ORAL
  Filled 2023-11-25 (×11): qty 1

## 2023-11-25 MED ORDER — IPRATROPIUM-ALBUTEROL 0.5-2.5 (3) MG/3ML IN SOLN
3.0000 mL | Freq: Once | RESPIRATORY_TRACT | Status: AC
Start: 2023-11-25 — End: 2023-11-25
  Administered 2023-11-25: 3 mL via RESPIRATORY_TRACT
  Filled 2023-11-25: qty 3

## 2023-11-25 MED ORDER — IPRATROPIUM-ALBUTEROL 0.5-2.5 (3) MG/3ML IN SOLN
3.0000 mL | Freq: Once | RESPIRATORY_TRACT | Status: AC
  Administered 2023-11-25: 3 mL via RESPIRATORY_TRACT
  Filled 2023-11-25: qty 3

## 2023-11-25 MED ORDER — GUAIFENESIN ER 600 MG PO TB12
600.0000 mg | ORAL_TABLET | Freq: Two times a day (BID) | ORAL | Status: DC
Start: 2023-11-25 — End: 2023-12-01
  Administered 2023-11-25 – 2023-12-01 (×12): 600 mg via ORAL
  Filled 2023-11-25 (×12): qty 1

## 2023-11-25 NOTE — Hospital Course (Signed)
 [name] is a 73 y.o. female with medical history significant for chronic HFpEF, moderate persistent asthma, HTN, HLD, RA, SLE, iron deficiency anemia, leukocytoclastic lymphoma in remission who is admitted with acute asthma exacerbation.

## 2023-11-25 NOTE — ED Triage Notes (Signed)
 Pt drove up to station. SOB and hx CHF and Asthma. EMS reports wheezing on exhale 7.5 albuterol ,  atrovent 1mg , 94% RA upon arrival to station. Solumedrol given as well

## 2023-11-25 NOTE — ED Provider Notes (Signed)
 [facility] AT [facility] Provider Note   CSN: [id number] Arrival date & time: 11/25/23  1717     History Chief Complaint  Patient presents with   Shortness of Breath    [name] is a 73 y.o. female patient with history of asthma, hypertension, CHF who presents to the emergency room today for further evaluation of shortness of breath which has been constant and worsening for the last 3 days.  Patient states she was shopping when she walked by some wound with a very noxious perfume smell which then caused her to start having wheezing and subsequent shortness of breath and nasal congestion.  She reports associated chest pain primarily when she coughs.  She denies any new leg swelling, weight gain, fever, chills, abdominal pain, nausea, vomiting, diarrhea.   Shortness of Breath      Home Medications Prior to Admission medications   Medication Sig Start Date End Date Taking? Authorizing Provider  acetaminophen  (TYLENOL ) 500 MG tablet Take 1,000 mg by mouth every 6 (six) hours as needed for mild pain or headache.    [provider]  albuterol  (VENTOLIN  HFA) 108 (90 Base) MCG/ACT inhaler Inhale 2 puffs into the lungs every 6 (six) hours as needed for wheezing or shortness of breath. 10/27/21   Lind Repine, MD  aspirin  81 MG chewable tablet Chew by mouth daily.    [provider]  carboxymethylcellulose (REFRESH PLUS) 0.5 % SOLN Apply 1 drop to eye.    [provider]  Carboxymethylcellulose Sodium 1 % GEL Apply 1 drop to eye.    [provider]  cholecalciferol  (VITAMIN D ) 1000 UNITS tablet Take 1,000 Units by mouth daily.     [provider]  cycloSPORINE, PF, (CEQUA) 0.09 % SOLN Apply to eye. Patient not taking: Reported on 10/09/2023 09/14/23   [provider]  fluorometholone (EFLONE) 0.1 % ophthalmic suspension Place 2 drops into the right eye 2 (two) times daily.    [provider]   fluticasone -salmeterol (ADVAIR  HFA) 115-21 MCG/ACT inhaler Inhale 2 puffs into the lungs. 05/30/22   [provider]  fluticasone -salmeterol (ADVAIR  HFA) 230-21 MCG/ACT inhaler Inhale 2 puffs into the lungs 2 (two) times daily. 02/10/22   Cobb, Mariah Shines, NP  furosemide  (LASIX ) 80 MG tablet Take by mouth. Patient not taking: Reported on 10/21/2023 03/30/22   [provider]  hydroxychloroquine  (PLAQUENIL ) 200 MG tablet Take 200 mg by mouth daily.    [provider]  latanoprost  (XALATAN ) 0.005 % ophthalmic solution Place 1 drop into both eyes at bedtime. 04/13/18   [provider]  losartan  (COZAAR ) 100 MG tablet Take 100 mg by mouth daily. Patient not taking: Reported on 10/09/2023 09/12/23   [provider]  losartan  (COZAAR ) 50 MG tablet Take 1 tablet (50 mg total) by mouth daily. 09/08/23   Maudine Sos, MD  pantoprazole  (PROTONIX ) 40 MG tablet Take 40 mg by mouth daily.    [provider]  potassium chloride  SA (KLOR-CON  M) 20 MEQ tablet Take 1 tablet (20 mEq total) by mouth 2 (two) times daily. 2 in the morning and 1 in the evening Patient taking differently: Take 20 mEq by mouth 2 (two) times daily. 06/15/2023 1 in the morning and 1 in the evening 04/13/23   Clearnce Curia, NP  Probiotic, Lactobacillus, CAPS Take 1 capsule by mouth daily.    [provider]  rosuvastatin  (CRESTOR ) 20 MG tablet Take 1 tablet (20 mg total)  by mouth daily. 04/11/23   Clearnce Curia, NP  Spacer/Aero-Holding Chambers (AEROCHAMBER MV) inhaler Use as instructed 10/27/21   Lind Repine, MD  spironolactone  (ALDACTONE ) 25 MG tablet Take 0.5 tablets (12.5 mg total) by mouth daily. 04/13/23 08/30/23  Clearnce Curia, NP  spironolactone  (ALDACTONE ) 25 MG tablet Take 12.5 mg by mouth.    [provider]  torsemide  (DEMADEX ) 20 MG tablet Take 3 tablets by mouth once daily 09/22/23   Maudine Sos, MD  torsemide  (DEMADEX ) 20 MG tablet Take 2  tablets by mouth daily. Patient not taking: Reported on 10/09/2023 05/01/23   [provider]  torsemide  (DEMADEX ) 20 MG tablet Take 20 mg by mouth. 05/01/23   [provider]  traZODone  (DESYREL ) 50 MG tablet Take 1 tablet (50 mg total) by mouth at bedtime as needed for sleep. 10/02/23   Ivor Mars, MD  traZODone  (DESYREL ) 50 MG tablet Take 50 mg by mouth. 10/02/23   [provider]  triamcinolone  (NASACORT ) 55 MCG/ACT AERO nasal inhaler Place 2 sprays into the nose. 02/01/22   [provider]  Turmeric 500 MG CAPS Take by mouth daily.    [provider]  Turmeric, Fermin Hove, POWD Take 1 capsule by mouth daily.    [provider]  valACYclovir  (VALTREX ) 1000 MG tablet Take 1,000 mg by mouth 2 (two) times daily as needed. 02/10/23   [provider]      Allergies    Iodinated contrast media, Erythromycin  base, Ciprofloxacin, and Oxycodone    Review of Systems   Review of Systems  Respiratory:  Positive for shortness of breath.   All other systems reviewed and are negative.   Physical Exam Updated Vital Signs BP (!) 164/72   Pulse 88   Temp 98.7 F (37.1 C) (Oral)   Resp 19   Ht 5\' 6"  (1.676 m)   Wt 88.4 kg   SpO2 96%   BMI 31.46 kg/m  Physical Exam Vitals and nursing note reviewed.  Constitutional:      General: She is in acute distress.     Appearance: Normal appearance.  HENT:     Head: Normocephalic and atraumatic.  Eyes:     General:        Right eye: No discharge.        Left eye: No discharge.  Cardiovascular:     Comments: Regular rate and rhythm.  S1/S2 are distinct without any evidence of murmur, rubs, or gallops.  Radial pulses are 2+ bilaterally.  Dorsalis pedis pulses are 2+ bilaterally.  No evidence of pedal edema. Pulmonary:     Comments: Diffuse expiratory wheezing heard throughout all lung fields.  There is evidence of tachypnea and decreased breath sounds.  Mild respiratory  distress. Abdominal:     General: Abdomen is flat. Bowel sounds are normal. There is no distension.     Tenderness: There is no abdominal tenderness. There is no guarding or rebound.  Musculoskeletal:        General: Normal range of motion.     Cervical back: Neck supple.  Skin:    General: Skin is warm and dry.     Findings: No rash.  Neurological:     General: No focal deficit present.     Mental Status: She is alert.  Psychiatric:        Mood and Affect: Mood normal.        Behavior: Behavior normal.     ED Results / Procedures /  Treatments   Labs (all labs ordered are listed, but only abnormal results are displayed) Labs Reviewed  CBC WITH DIFFERENTIAL/PLATELET - Abnormal; Notable for the following components:      Result Value   WBC 10.8 (*)    RBC 3.51 (*)    Hemoglobin 11.3 (*)    HCT 35.2 (*)    MCV 100.3 (*)    Platelets 143 (*)    Neutro Abs 8.9 (*)    All other components within normal limits  BRAIN NATRIURETIC PEPTIDE - Abnormal; Notable for the following components:   B Natriuretic Peptide 142.7 (*)    All other components within normal limits  BASIC METABOLIC PANEL WITH GFR - Abnormal; Notable for the following components:   Potassium 3.0 (*)    Glucose, Bld 158 (*)    Calcium  8.6 (*)    All other components within normal limits  TROPONIN I (HIGH SENSITIVITY) - Abnormal; Notable for the following components:   Troponin I (High Sensitivity) 38 (*)    All other components within normal limits  TROPONIN I (HIGH SENSITIVITY) - Abnormal; Notable for the following components:   Troponin I (High Sensitivity) 43 (*)    All other components within normal limits  RESP PANEL BY RT-PCR (RSV, FLU A&B, COVID)  RVPGX2  MAGNESIUM   BASIC METABOLIC PANEL WITH GFR  CBC    EKG EKG Interpretation Date/Time:  Saturday Nov 25 2023 17:59:43 EDT Ventricular Rate:  102 PR Interval:  179 QRS Duration:  69 QT Interval:  317 QTC Calculation: 413 R Axis:   71  Text  Interpretation: Sinus or ectopic atrial tachycardia Repol abnrm suggests ischemia, anterolateral mildly worsened depression Confirmed by Russella Courts ([id number]) on 11/25/2023 7:03:58 PM  Radiology DG Chest 2 View Result Date: 11/25/2023 CLINICAL DATA:  Shortness of breath EXAM: CHEST - 2 VIEW COMPARISON:  Chest x-ray 02/10/2022 and older FINDINGS: Enlarged cardiopericardial silhouette with some prominence of central vasculature. No consolidation, pneumothorax or effusion. Mild interstitial prominence. Films are under penetrated. Degenerative changes along the spine. Overlapping cardiac leads. Left shoulder reverse arthroplasty. Degenerative changes of the right shoulder with surgical Burr. IMPRESSION: Enlarged cardiopericardial silhouette with some vascular congestion. Under penetrated radiographs. Electronically Signed   By: Adrianna Horde M.D.   On: 11/25/2023 19:11    Procedures .Critical Care  Performed by: Darletta Ehrich, PA-C Authorized by: Darletta Ehrich, PA-C   Critical care provider statement:    Critical care time (minutes):  35   Critical care time was exclusive of:  Separately billable procedures and treating other patients   Critical care was necessary to treat or prevent imminent or life-threatening deterioration of the following conditions:  Respiratory failure   Critical care was time spent personally by me on the following activities:  Blood draw for specimens, development of treatment plan with patient or surrogate, discussions with consultants, ordering and performing treatments and interventions, ordering and review of laboratory studies, ordering and review of radiographic studies, pulse oximetry and re-evaluation of patient's condition     Medications Ordered in ED Medications  enoxaparin  (LOVENOX ) injection 40 mg (has no administration in time range)  sodium chloride  flush (NS) 0.9 % injection 3 mL (has no administration in time range)  acetaminophen  (TYLENOL ) tablet 650  mg (has no administration in time range)    Or  acetaminophen  (TYLENOL ) suppository 650 mg (has no administration in time range)  ondansetron  (ZOFRAN ) tablet 4 mg (has no administration in time range)    Or  ondansetron  (ZOFRAN ) injection 4 mg (has no administration in time range)  senna-docusate (Senokot-S) tablet 1 tablet (has no administration in time range)  bisacodyl  (DULCOLAX) EC tablet 5 mg (has no administration in time range)  guaiFENesin (MUCINEX) 12 hr tablet 600 mg (has no administration in time range)  arformoterol (BROVANA) nebulizer solution 15 mcg (has no administration in time range)  budesonide (PULMICORT) nebulizer solution 0.25 mg (has no administration in time range)  ipratropium-albuterol  (DUONEB) 0.5-2.5 (3) MG/3ML nebulizer solution 3 mL (has no administration in time range)  methylPREDNISolone  sodium succinate (SOLU-MEDROL ) 40 mg/mL injection 40 mg (has no administration in time range)  furosemide  (LASIX ) injection 40 mg (has no administration in time range)  ipratropium-albuterol  (DUONEB) 0.5-2.5 (3) MG/3ML nebulizer solution 3 mL (3 mLs Nebulization Given 11/25/23 1833)  methylPREDNISolone  sodium succinate (SOLU-MEDROL ) 125 mg/2 mL injection 125 mg (125 mg Intravenous Given 11/25/23 1827)  magnesium  sulfate IVPB 2 g 50 mL (0 g Intravenous Stopped 11/25/23 1931)  potassium chloride  SA (KLOR-CON  M) CR tablet 40 mEq (40 mEq Oral Given 11/25/23 2134)  ipratropium-albuterol  (DUONEB) 0.5-2.5 (3) MG/3ML nebulizer solution 3 mL (3 mLs Nebulization Given 11/25/23 2135)    ED Course/ Medical Decision Making/ A&P Clinical Course as of 11/25/23 2200  Sat Nov 25, 2023  2059 On reevaluation, patient is still having a fair amount of expiratory wheezing.  Given that the patient is not entirely better after treatments, steroids, magnesium  in addition to elevated troponins and new EKG changes I do feel the patient would likely benefit from further evaluation inside the hospital. [CF]   2105 Resp panel by RT-PCR (RSV, Flu A&B, Covid) Anterior Nasal Swab Negative. [CF]  2105 Brain natriuretic peptide(!) Elevated but does not meet CHF criteria. [CF]  2105 Troponin I (High Sensitivity)(!) Initial delta troponin are elevated.  I do favor demand ischemia given the patient's work of breathing. [CF]  2105 CBC with Differential(!) There is evidence of leukocytosis with neutrophilic dominance. [CF]  2105 Basic metabolic panel with GFR(!) Mild hypokalemia. [CF]  2117 I spoke with Dr. Lydia Sams with [facility] hospitalist who agrees to admit the patient. [CF]    Clinical Course User Index [CF] Darletta Ehrich, PA-C   {   Click here for ABCD2, HEART and other calculators  Medical Decision Making SHATARIA CRIST is a 73 y.o. female patient presents to the emerged from today for further evaluation of shortness of breath.  This seems more in line with a asthma exacerbation though does a CHF exacerbation.  She does not clinically appear to be volume overloaded.  She does have some diffuse expiratory wheezing.  Will start off with a DuoNeb, steroids, and magnesium .  As highlighted in the ED course, patient has had mild improvement with medication regimen.  I am going to give her another round of nebulizer treatments.  However, given the new EKG findings, elevated troponins although likely demand ischemia, and diffuse wheezing with some mild respiratory distress I do feel the patient would likely benefit from further evaluation inside the hospital.  I will plan to get admission with the hospitalist service.  Amount and/or Complexity of Data Reviewed Labs: ordered. Decision-making details documented in ED Course. Radiology: ordered.  Risk Prescription drug management. Decision regarding hospitalization.    Final Clinical Impression(s) / ED Diagnoses Final diagnoses:  Severe asthma with exacerbation, unspecified whether persistent  Elevated troponin    Rx / DC Orders ED Discharge  Orders     None  Darletta Ehrich, PA-C 11/25/23 2200    Teddi Favors, DO 11/30/23 2349

## 2023-11-25 NOTE — H&P (Signed)
 History and Physical    [name] QIO:[id number] DOB: 06-26-1951 DOA: 11/25/2023  PCP: Virgle Grime, MD  Patient coming from: Home  I have personally briefly reviewed patient's old medical records in [facility]  Chief Complaint: Shortness of breath  HPI: [name] is a 73 y.o. female with medical history significant for chronic HFpEF, moderate persistent asthma, HTN, HLD, RA, SLE, iron deficiency anemia, lymphoplasmacytic lymphoma in remission who presented to the ED for evaluation of shortness of breath.  Patient reports that she has a history of allergic rhinitis that is easily triggered by noxious smells.  She says she was shopping 6 days ago when she smelled someone's very strong perfume.  She then developed sinus congestion with thick yellow rhinorrhea.  The following day she started to feel short of breath and wheezy consistent with exacerbation of her asthma.  She has had cough and chest congestion but has been unable to bring up any sputum.  She has been using her home inhalers without significant relief.  Dyspnea worsened and she called EMS.  She was given Solu-Medrol , albuterol /Atrovent nebulizers by EMS en route to the ED.  Her SpO2 was 94%.  Patient otherwise denies fevers, chills, diaphoresis, chest pain, dysuria, lower extremity edema.  ED Course  Labs/Imaging on admission: I have personally reviewed following labs and imaging studies.  Initial vitals showed BP 131/65, pulse 108, RR 25, temp 98.9 F, SpO2 97% on room air.  Labs showed sodium 139, potassium 3.0, bicarb 22, BUN 11, creatinine 0.94, serum glucose 158, BNP 142.7, troponin 38 > 43, WBC 10.8, hemoglobin 11.3, platelets 143.  SARS-CoV-2, influenza, RSV PCR negative.  2 view chest x-ray shows enlarged cardiopericardial silhouette with some vascular congestion.  No focal consolidation, pneumothorax, or effusion.  Patient was given IV Solu-Medrol  125 mg, IV magnesium  2 g, oral K 40 mEq, and  DuoNeb treatment x 2.  The hospitalist service was consulted to admit.  Review of Systems: All systems reviewed and are negative except as documented in history of present illness above.   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 2022 - right   MASS EXCISION     back   REPLACEMENT TOTAL HIP W/  RESURFACING IMPLANTS Left 09/2022   rortator     rotator cuff   TOTAL SHOULDER ARTHROPLASTY Left 08/16/2018   Procedure: LEFT SHOULDER REVERSE TOTAL SHOULDER ARTHROPLASTY AND BICEPS TENOTOMY;  Surgeon: Sammye Cristal, MD;  Location: [facility] OR;  Service: Orthopedics;  Laterality: Left;    Social History: Social History   Tobacco Use   Smoking status: Former    Current packs/day: 0.00    Average packs/day: 1 pack/day for  10.0 years (10.0 ttl pk-yrs)    Types: Cigarettes    Start date: 06/17/1984    Quit date: 06/17/1994    Years since quitting: 29.4    Passive exposure: Past   Smokeless tobacco: Never  Vaping Use   Vaping status: Never Used  Substance Use Topics   Alcohol  use: Not Currently    Alcohol /week: 0.0 standard drinks of alcohol    Drug use: No    Allergies  Allergen Reactions   Iodinated Contrast Media Other (See Comments)    Kidney failure.    Erythromycin  Base Itching    Red  streaks on arms   Ciprofloxacin Itching and Rash   Oxycodone Other (See Comments)    Hallucination.    Family History  Problem Relation Age of Onset   Lung disease Father    Coronary artery disease Brother    Breast cancer Neg Hx      Prior to Admission medications   Medication Sig Start Date End Date Taking? Authorizing Provider  acetaminophen  (TYLENOL ) 500 MG tablet Take 1,000 mg by mouth every 6 (six) hours as needed for mild pain or headache.    [provider]  albuterol  (VENTOLIN  HFA) 108 (90 Base) MCG/ACT inhaler Inhale 2 puffs into the lungs every 6 (six) hours as needed for wheezing or shortness of breath. 10/27/21   Lind Repine, MD  aspirin  81 MG chewable tablet Chew by mouth daily.    [provider]  carboxymethylcellulose (REFRESH PLUS) 0.5 % SOLN Apply 1 drop to eye.    [provider]  Carboxymethylcellulose Sodium 1 % GEL Apply 1 drop to eye.    [provider]  cholecalciferol  (VITAMIN D ) 1000 UNITS tablet Take 1,000 Units by mouth daily.     [provider]  cycloSPORINE, PF, (CEQUA) 0.09 % SOLN Apply to eye. Patient not taking: Reported on 10/09/2023 09/14/23   [provider]  fluorometholone (EFLONE) 0.1 % ophthalmic suspension Place 2 drops into the right eye 2 (two) times daily.    [provider]  fluticasone -salmeterol (ADVAIR  HFA) 115-21 MCG/ACT inhaler Inhale 2 puffs into the lungs. 05/30/22   [provider]  fluticasone -salmeterol (ADVAIR  HFA) 230-21 MCG/ACT inhaler Inhale 2 puffs into the lungs 2 (two) times daily. 02/10/22   Cobb, Mariah Shines, NP  furosemide  (LASIX ) 80 MG tablet Take by mouth. Patient not taking: Reported on 10/21/2023 03/30/22   [provider]  hydroxychloroquine  (PLAQUENIL ) 200 MG tablet Take 200 mg by mouth daily.    [provider]  latanoprost  (XALATAN ) 0.005 % ophthalmic solution Place 1 drop into both eyes at bedtime. 04/13/18   [provider]  losartan  (COZAAR ) 100 MG tablet Take 100 mg by mouth daily. Patient not taking: Reported on 10/09/2023 09/12/23   [provider]  losartan  (COZAAR ) 50 MG tablet Take 1 tablet (50 mg total) by mouth daily. 09/08/23   Maudine Sos, MD  pantoprazole  (PROTONIX ) 40 MG tablet Take 40 mg by mouth daily.    [provider]  potassium chloride  SA (KLOR-CON  M) 20 MEQ tablet Take 1 tablet (20 mEq total) by mouth 2 (two) times daily. 2 in the morning and 1 in the evening Patient taking differently: Take 20 mEq by mouth 2 (two) times daily. 06/15/2023 1 in the morning and 1 in the evening 04/13/23   Clearnce Curia, NP  Probiotic, Lactobacillus, CAPS Take 1 capsule by mouth daily.    [provider]  rosuvastatin  (CRESTOR ) 20 MG tablet  Take 1 tablet (20 mg total) by mouth daily. 04/11/23   Clearnce Curia, NP  Spacer/Aero-Holding Chambers (AEROCHAMBER MV) inhaler Use as instructed 10/27/21   Lind Repine, MD  spironolactone  (ALDACTONE ) 25 MG tablet Take 0.5 tablets (12.5 mg total) by mouth daily. 04/13/23 08/30/23  Clearnce Curia, NP  spironolactone  (ALDACTONE ) 25 MG tablet Take 12.5 mg by mouth.    [provider]  torsemide  (DEMADEX ) 20 MG tablet Take 3 tablets by mouth once daily 09/22/23   Maudine Sos, MD  torsemide  (DEMADEX ) 20 MG tablet Take 2 tablets by mouth daily. Patient not taking: Reported on 10/09/2023 05/01/23   [provider]  torsemide  (DEMADEX ) 20 MG tablet Take 20 mg by mouth. 05/01/23   [provider]  traZODone  (DESYREL ) 50 MG tablet Take 1 tablet (50 mg total) by mouth at bedtime as needed for sleep. 10/02/23   Ivor Mars, MD  traZODone  (DESYREL ) 50 MG tablet Take 50 mg by mouth. 10/02/23   [provider]  triamcinolone  (NASACORT ) 55 MCG/ACT AERO nasal inhaler Place 2 sprays into the nose. 02/01/22   [provider]  Turmeric 500 MG CAPS Take by mouth daily.    [provider]   Turmeric, Fermin Hove, POWD Take 1 capsule by mouth daily.    [provider]  valACYclovir  (VALTREX ) 1000 MG tablet Take 1,000 mg by mouth 2 (two) times daily as needed. 02/10/23   [provider]    Physical Exam: Vitals:   11/25/23 2115 11/25/23 2128 11/25/23 2130 11/25/23 2238  BP: (!) 170/74  (!) 164/72 126/74  Pulse: 85  88 (!) 102  Resp: 18  19 18   Temp:  98.7 F (37.1 C)  97.8 F (36.6 C)  TempSrc:  Oral  Oral  SpO2: 94%  96% 99%  Weight:      Height:       \Constitutional: Resting in bed with head elevated, appears fatigued and currently using DuoNeb nebulizer Eyes: EOMI, lids and conjunctivae normal ENMT: Mucous membranes are moist. Posterior pharynx clear of any exudate or lesions.Normal dentition.  Neck: normal, supple, no masses. Respiratory: Coarse expiratory wheezing throughout. Normal respiratory effort while using DuoNeb treatment. No accessory muscle use.  Cardiovascular: Regular rate and rhythm, no murmurs / rubs / gallops. No extremity edema. 2+ pedal pulses. Abdomen: no tenderness, no masses palpated. Musculoskeletal: no clubbing / cyanosis. No joint deformity upper and lower extremities. Good ROM, no contractures. Normal muscle tone.  Skin: no rashes, lesions, ulcers. No induration Neurologic: Sensation intact. Strength 5/5 in all 4.  Psychiatric: Normal judgment and insight. Alert and oriented x 3. Normal mood.   EKG: Personally reviewed.  Sinus rhythm, rate 102, inferolateral T wave inversions more pronounced when compared to prior.  Assessment/Plan Principal Problem:   Moderate persistent asthma with allergic rhinitis without status asthmaticus with acute exacerbation Active Problems:   Chronic heart failure with preserved ejection fraction (HFpEF, >= 50%) (HCC)   Lupus erythematosus   Essential hypertension   Pure hypercholesterolemia   Iron deficiency anemia   Thrombocytopenia (HCC)   Hypokalemia   [name] is a 73 y.o.  female with medical history significant for chronic HFpEF, moderate persistent asthma, HTN, HLD, RA, SLE, iron deficiency anemia, leukocytoclastic lymphoma in remission who is admitted with acute asthma exacerbation.  Assessment and Plan: Moderate persistent asthma with acute exacerbation: Still with significant wheezing throughout the lung fields after initial treatment with EMS and in the ED.  Currently saturating >  94% on room air.  COVID, influenza, RSV PCR negative.  Mild leukocytosis likely from steroids given PTA.  Mild troponin elevation likely demand ischemia from asthma exacerbation. - Start IV Solu-Medrol  40 mg twice daily - Start scheduled Brovana/Pulmicort twice daily - DuoNebs as needed - Supplemental oxygen as needed - Incentive spirometer, flutter valve, Mucinex  Chronic HFpEF: Last EF 65-70% by TTE 06/02/2022.  She has little bit of vascular congestion on CXR otherwise not significantly volume overloaded.  BNP mildly elevated 143.7. - IV Lasix  40 mg once tonight - Resume home torsemide  60 mg daily tomorrow - Continue spironolactone  12.5 mg daily - Continue losartan  50 mg daily - Continue potassium supplement  Hypokalemia: Supplementing.  Check magnesium  level.  Hypertension: Continue losartan , spironolactone , torsemide .  Iron deficiency anemia: Hemoglobin stable at 11.3.  Rheumatoid arthritis/SLE: Continue Plaquenil .  Mild thrombocytopenia: Chronic and stable.  History of CVA Hyperlipidemia: Continue aspirin  and rosuvastatin .  Insomnia: Continue trazodone  nightly as needed.   DVT prophylaxis: enoxaparin  (LOVENOX ) injection 40 mg Start: 11/25/23 2200 Code Status: Full code, confirmed with patient on admission Family Communication: Discussed with patient, she has discussed with family Disposition Plan: From home and likely discharge to home pending clinical progress Consults called: None Severity of Illness: The appropriate patient status for this patient  is OBSERVATION. Observation status is judged to be reasonable and necessary in order to provide the required intensity of service to ensure the patient's safety. The patient's presenting symptoms, physical exam findings, and initial radiographic and laboratory data in the context of their medical condition is felt to place them at decreased risk for further clinical deterioration. Furthermore, it is anticipated that the patient will be medically stable for discharge from the hospital within 2 midnights of admission.   Edith Gores MD [facility]  If 7PM-7AM, please contact night-coverage [email or web address]  11/25/2023, 10:40 PM

## 2023-11-26 DIAGNOSIS — M329 Systemic lupus erythematosus, unspecified: Secondary | ICD-10-CM | POA: Diagnosis present

## 2023-11-26 DIAGNOSIS — Z8673 Personal history of transient ischemic attack (TIA), and cerebral infarction without residual deficits: Secondary | ICD-10-CM | POA: Diagnosis not present

## 2023-11-26 DIAGNOSIS — D696 Thrombocytopenia, unspecified: Secondary | ICD-10-CM | POA: Diagnosis present

## 2023-11-26 DIAGNOSIS — Z7951 Long term (current) use of inhaled steroids: Secondary | ICD-10-CM | POA: Diagnosis not present

## 2023-11-26 DIAGNOSIS — Z7982 Long term (current) use of aspirin: Secondary | ICD-10-CM | POA: Diagnosis not present

## 2023-11-26 DIAGNOSIS — Z8249 Family history of ischemic heart disease and other diseases of the circulatory system: Secondary | ICD-10-CM | POA: Diagnosis not present

## 2023-11-26 DIAGNOSIS — R0600 Dyspnea, unspecified: Secondary | ICD-10-CM | POA: Diagnosis not present

## 2023-11-26 DIAGNOSIS — I2489 Other forms of acute ischemic heart disease: Secondary | ICD-10-CM | POA: Diagnosis present

## 2023-11-26 DIAGNOSIS — I5032 Chronic diastolic (congestive) heart failure: Secondary | ICD-10-CM

## 2023-11-26 DIAGNOSIS — I110 Hypertensive heart disease with heart failure: Secondary | ICD-10-CM | POA: Diagnosis present

## 2023-11-26 DIAGNOSIS — K219 Gastro-esophageal reflux disease without esophagitis: Secondary | ICD-10-CM | POA: Diagnosis present

## 2023-11-26 DIAGNOSIS — E876 Hypokalemia: Secondary | ICD-10-CM | POA: Diagnosis present

## 2023-11-26 DIAGNOSIS — E7800 Pure hypercholesterolemia, unspecified: Secondary | ICD-10-CM | POA: Diagnosis present

## 2023-11-26 DIAGNOSIS — I10 Essential (primary) hypertension: Secondary | ICD-10-CM | POA: Diagnosis not present

## 2023-11-26 DIAGNOSIS — J4541 Moderate persistent asthma with (acute) exacerbation: Secondary | ICD-10-CM | POA: Diagnosis not present

## 2023-11-26 DIAGNOSIS — Z96612 Presence of left artificial shoulder joint: Secondary | ICD-10-CM | POA: Diagnosis present

## 2023-11-26 DIAGNOSIS — Z6831 Body mass index (BMI) 31.0-31.9, adult: Secondary | ICD-10-CM | POA: Diagnosis not present

## 2023-11-26 DIAGNOSIS — R0602 Shortness of breath: Secondary | ICD-10-CM | POA: Diagnosis present

## 2023-11-26 DIAGNOSIS — Z87891 Personal history of nicotine dependence: Secondary | ICD-10-CM | POA: Diagnosis not present

## 2023-11-26 DIAGNOSIS — B9789 Other viral agents as the cause of diseases classified elsewhere: Secondary | ICD-10-CM | POA: Diagnosis present

## 2023-11-26 DIAGNOSIS — Z1152 Encounter for screening for COVID-19: Secondary | ICD-10-CM | POA: Diagnosis not present

## 2023-11-26 DIAGNOSIS — C859A Non-Hodgkin lymphoma, unspecified, in remission: Secondary | ICD-10-CM | POA: Diagnosis present

## 2023-11-26 DIAGNOSIS — G4700 Insomnia, unspecified: Secondary | ICD-10-CM | POA: Diagnosis present

## 2023-11-26 DIAGNOSIS — Z79899 Other long term (current) drug therapy: Secondary | ICD-10-CM | POA: Diagnosis not present

## 2023-11-26 DIAGNOSIS — T380X5A Adverse effect of glucocorticoids and synthetic analogues, initial encounter: Secondary | ICD-10-CM | POA: Diagnosis present

## 2023-11-26 DIAGNOSIS — D509 Iron deficiency anemia, unspecified: Secondary | ICD-10-CM | POA: Diagnosis present

## 2023-11-26 DIAGNOSIS — M069 Rheumatoid arthritis, unspecified: Secondary | ICD-10-CM | POA: Diagnosis present

## 2023-11-26 LAB — BASIC METABOLIC PANEL WITH GFR
Anion gap: 8 (ref 5–15)
BUN: 13 mg/dL (ref 8–23)
CO2: 24 mmol/L (ref 22–32)
Calcium: 9 mg/dL (ref 8.9–10.3)
Chloride: 106 mmol/L (ref 98–111)
Creatinine, Ser: 0.9 mg/dL (ref 0.44–1.00)
GFR, Estimated: >60 mL/min (ref >60)
Glucose, Bld: 149 mg/dL — ABNORMAL HIGH (ref 70–99)
Potassium: 3.8 mmol/L (ref 3.5–5.1)
Sodium: 138 mmol/L (ref 135–145)

## 2023-11-26 LAB — CBC
HCT: 34.6 % — ABNORMAL LOW (ref 36.0–46.0)
Hemoglobin: 11.2 g/dL — ABNORMAL LOW (ref 12.0–15.0)
MCH: 32.2 pg (ref 26.0–34.0)
MCHC: 32.4 g/dL (ref 30.0–36.0)
MCV: 99.4 fL (ref 80.0–100.0)
Platelets: 152 10*3/uL (ref 150–400)
RBC: 3.48 MIL/uL — ABNORMAL LOW (ref 3.87–5.11)
RDW: 12.7 % (ref 11.5–15.5)
WBC: 10.8 10*3/uL — ABNORMAL HIGH (ref 4.0–10.5)
nRBC: 0 % (ref 0.0–0.2)

## 2023-11-26 LAB — MAGNESIUM: Magnesium: 2.2 mg/dL (ref 1.7–2.4)

## 2023-11-26 MED ORDER — GUAIFENESIN-DM 100-10 MG/5ML PO SYRP
5.0000 mL | ORAL_SOLUTION | ORAL | Status: DC | PRN
  Administered 2023-11-26 (×2): 5 mL via ORAL
  Filled 2023-11-26 (×2): qty 5

## 2023-11-26 MED ORDER — BENZONATATE 100 MG PO CAPS
200.0000 mg | ORAL_CAPSULE | Freq: Three times a day (TID) | ORAL | Status: DC
  Administered 2023-11-26 – 2023-11-27 (×3): 200 mg via ORAL
  Filled 2023-11-26 (×4): qty 2

## 2023-11-26 NOTE — Plan of Care (Signed)

## 2023-11-26 NOTE — Progress Notes (Signed)
  Progress Note   Patient: [name] ZOX:[id number] DOB: September 28, 1950 DOA: 11/25/2023     0 DOS: the patient was seen and examined on 11/26/2023   Brief hospital course: 73 year old woman PMH including moderate persistent asthma, leukocytoclastic lymphoma in remission.  Presenting with cough congestion, dyspnea.  Admitted for acute asthma exacerbation.   Consultants None   Procedures/Events None   Assessment and Plan: Moderate persistent asthma with acute exacerbation: Not back to baseline, still wheezing.   Continue IV steroids, bronchodilators    Chronic HFpEF Last EF 65-70% by TTE 06/02/2022.  Appears compensated.  Continue torsemide , spironolactone , losartan    Hypokalemia Potassium within normal limits   Hypertension Stable.  Continue losartan , spironolactone , torsemide .   Iron deficiency anemia Hemoglobin stable    Rheumatoid arthritis/SLE: Continue Plaquenil .   Mild thrombocytopenia, resolved   History of CVA Hyperlipidemia Continue aspirin  and rosuvastatin .   Insomnia Continue trazodone  nightly as needed.       Subjective:  Still short of breath, wheezing, cough.  Not back to baseline.  Physical Exam: Vitals:   11/26/23 0609 11/26/23 0838 11/26/23 1511 11/26/23 1525  BP: (!) 132/59  (!) 135/55   Pulse: 68  69   Resp:   19   Temp: 98.8 F (37.1 C)  97.9 F (36.6 C)   TempSrc: Oral  Oral   SpO2: 99% 98% 97% 97%  Weight:      Height:       Physical Exam Vitals reviewed.  Constitutional:      General: She is not in acute distress.    Appearance: She is not ill-appearing or toxic-appearing.  Cardiovascular:     Rate and Rhythm: Normal rate and regular rhythm.     Heart sounds: No murmur heard. Pulmonary:     Effort: No respiratory distress.     Breath sounds: Wheezing present. No rhonchi or rales.     Comments: Mild increased respiratory effort and dyspnea on examination. Neurological:     Mental Status: She is alert.  Psychiatric:         Mood and Affect: Mood normal.        Behavior: Behavior normal.     Data Reviewed: Basic metabolic panel unremarkable Magnesium  within normal limits WBC stable 10.8 Hemoglobin stable 11.2  Family Communication: none  Disposition: Status is: Observation      Time spent: 35 minutes  Author: Jerline Moon, MD 11/26/2023 4:20 PM  For on call review [email or web address].

## 2023-11-26 NOTE — Care Management Obs Status (Signed)
 MEDICARE OBSERVATION STATUS NOTIFICATION   Patient Details  Name: SCHUYLER BEHAN MRN: [id number] Date of Birth: 12-26-1950   Medicare Observation Status Notification Given:  Yes    Levie Ream, RN 11/26/2023, 4:08 PM

## 2023-11-26 NOTE — TOC Initial Note (Signed)
 Transition of Care [facility]) - Initial/Assessment Note    Patient Details  Name: [name] MRN: [id number] Date of Birth: [birth year]  [facility] CM/SW Contact:    Levie Ream, RN Phone Number: 11/26/2023, 4:14 PM  Clinical Narrative:                 Eldora Greet w/ pt in room; pt says she lives at home; she plans to return at d/c; pt identified POC Rickie Oborn (spouse) [phone]; he will provide transportation; pt verified insurance/PCP; she denied SDOH risks; pt says she does not have DME, HH services, or home oxygen; TOC will follow.  Expected Discharge Plan: Home/Self Care Barriers to Discharge: Continued Medical Work up   Patient Goals and CMS Choice Patient states their goals for this hospitalization and ongoing recovery are:: home CMS Medicare.gov Compare Post Acute Care list provided to:: Patient   [place] ownership interest in [facility]provided to:: Patient    Expected Discharge Plan and Services   Discharge Planning Services: CM Consult   Living arrangements for the past 2 months: Single Family Home                                      Prior Living Arrangements/Services Living arrangements for the past 2 months: Single Family Home Lives with:: Spouse Patient language and need for interpreter reviewed:: Yes Do you feel safe going back to the place where you live?: Yes      Need for Family Participation in Patient Care: Yes (Comment) Care giver support system in place?: Yes (comment) Current home services:  (n/a) Criminal Activity/Legal Involvement Pertinent to Current Situation/Hospitalization: No - Comment as needed  Activities of Daily Living   ADL Screening (condition at time of admission) Independently performs ADLs?: Yes (appropriate for developmental age) Is the patient deaf or have difficulty hearing?: No Does the patient have difficulty seeing, even when wearing glasses/contacts?: No Does the patient have  difficulty concentrating, remembering, or making decisions?: No  Permission Sought/Granted Permission sought to share information with : Case Manager Permission granted to share information with : Yes, Verbal Permission Granted  Share Information with NAME: Case Manager     Permission granted to share info w Relationship: [employer] Teegarden (spouse) [phone]     Emotional Assessment Appearance:: Appears stated age Attitude/Demeanor/Rapport: Gracious Affect (typically observed): Accepting Orientation: : Oriented to Self, Oriented to Place, Oriented to  Time, Oriented to Situation Alcohol  / Substance Use: Not Applicable Psych Involvement: No (comment)  Admission diagnosis:  Elevated troponin [R79.89] Moderate persistent asthma with allergic rhinitis without status asthmaticus with acute exacerbation [J45.41] Severe asthma with exacerbation, unspecified whether persistent [J45.901] Patient Active Problem List   Diagnosis Date Noted   Moderate persistent asthma with allergic rhinitis without status asthmaticus with acute exacerbation 11/25/2023   Hypokalemia 11/25/2023   Abdominal bloating 10/09/2023   Abnormal findings on diagnostic imaging of other parts of digestive tract 10/09/2023   Change in bowel habit 10/09/2023   Colon cancer screening 10/09/2023   Diarrhea 10/09/2023   Irritable bowel syndrome with diarrhea 10/09/2023   Diverticula of intestine 10/09/2023   Diverticular disease of colon 10/09/2023   Diverticulosis of colon 10/09/2023   Diverticulosis of large intestine without perforation or abscess without bleeding 10/09/2023   Flatulence, eructation and gas pain 10/09/2023   Gastro-esophageal reflux disease with esophagitis 10/09/2023   Incontinence of feces 10/09/2023  Nausea 10/09/2023   Left lower quadrant abdominal pain 10/09/2023   Iron deficiency anemia 08/04/2023   Nuclear senile cataract of both eyes 07/21/2023   Choking 01/31/2023   Chronic kidney disease,  stage 2 (mild) 08/23/2022   Mild intermittent asthma without complication 08/23/2022   Moderate persistent asthma with exacerbation 08/23/2022   Thrombocytopenia (HCC) 08/23/2022   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   Allergic rhinitis 08/15/2022   Conjunctival lesion 07/11/2022   Pure hypercholesterolemia 05/31/2022   Acute sinusitis 02/10/2022   Bacterial conjunctivitis of right eye 01/20/2022   Exertional dyspnea 08/24/2021   Carotid stenosis 10/29/2020   Gait instability 10/29/2020   Near syncope 08/30/2019   Chronic heart failure with preserved ejection fraction (HFpEF, >= 50%) (HCC) 08/30/2019   Vaginal dryness 08/24/2018   S/P shoulder replacement, left 08/16/2018   Arthrosis of left shoulder 05/02/2018   Chest pain 05/01/2018   Asthma 05/01/2018   Essential hypertension 06/02/2017   Degenerative arthritis of knee, bilateral 02/22/2016   Patellar contusion 02/03/2016   Patellofemoral arthritis 10/22/2015   Radiculopathy 12/11/2014   Malignant lymphoma, 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 [place] ([place]) [place] [address] Phone: [phone] Fax: [phone]  [facility] - [place], [place] - [address] [place] [address] Phone: [phone] Fax: [phone]     Social Drivers of Health (SDOH) Social History: SDOH Screenings   Food Insecurity: No Food Insecurity (11/26/2023)  Housing: Low Risk  (11/26/2023)  Transportation Needs: No Transportation Needs (11/26/2023)  Utilities: Not At Risk (11/26/2023)  Social Connections: Moderately Integrated (11/25/2023)  Tobacco Use: Medium Risk (11/25/2023)   SDOH Interventions: Food Insecurity Interventions: Intervention Not Indicated,  Inpatient TOC Housing Interventions: Intervention Not Indicated, Inpatient TOC Transportation Interventions: Intervention Not Indicated, Inpatient TOC Utilities Interventions: Intervention Not Indicated, Inpatient TOC   Readmission Risk Interventions     No data to display

## 2023-11-27 DIAGNOSIS — J4541 Moderate persistent asthma with (acute) exacerbation: Secondary | ICD-10-CM | POA: Diagnosis not present

## 2023-11-27 DIAGNOSIS — I5032 Chronic diastolic (congestive) heart failure: Secondary | ICD-10-CM | POA: Diagnosis not present

## 2023-11-27 MED ORDER — HYDROCODONE BIT-HOMATROP MBR 5-1.5 MG/5ML PO SOLN
5.0000 mL | Freq: Four times a day (QID) | ORAL | Status: DC | PRN
  Administered 2023-11-27 – 2023-12-01 (×12): 5 mL via ORAL
  Filled 2023-11-27 (×12): qty 5

## 2023-11-27 MED ORDER — DOXYCYCLINE HYCLATE 100 MG PO TABS
100.0000 mg | ORAL_TABLET | Freq: Two times a day (BID) | ORAL | Status: DC
  Administered 2023-11-27 – 2023-12-01 (×9): 100 mg via ORAL
  Filled 2023-11-27 (×9): qty 1

## 2023-11-27 NOTE — Progress Notes (Signed)
  Progress Note   Patient: [name] ZOX:[id number] DOB: 1951/06/22 DOA: 11/25/2023     1 DOS: the patient was seen and examined on 11/27/2023   Brief hospital course: 73 year old woman PMH including moderate persistent asthma, leukocytoclastic lymphoma in remission.  Presenting with cough congestion, dyspnea.  Admitted for acute asthma exacerbation.   Consultants None   Procedures/Events None   Assessment and Plan: Moderate persistent asthma with acute exacerbation: Not back to baseline, still wheezing today, frequent cough.   Continue IV steroids, bronchodilators.  Add doxycycline .  Add Hycodan.   Chronic HFpEF Last EF 65-70% by TTE 06/02/2022.  Appears compensated.  Continue torsemide , spironolactone , losartan    Hypokalemia Potassium within normal limits on last check   Hypertension Stable.  Continue losartan , spironolactone , torsemide .   Iron deficiency anemia Hemoglobin stable on last check   Rheumatoid arthritis/SLE: Continue Plaquenil .   Mild thrombocytopenia, resolved   History of CVA Hyperlipidemia Continue aspirin  and rosuvastatin .   Insomnia Continue trazodone  nightly as needed.     Subjective:  Still coughing a lot, wheezy with cough, SOB with cough Not back to baseline  Physical Exam: Vitals:   11/27/23 0411 11/27/23 0500 11/27/23 0820 11/27/23 1255  BP: 127/69   (!) 147/56  Pulse: 72   72  Resp: 20   (!) 21  Temp: 98.1 F (36.7 C)   97.8 F (36.6 C)  TempSrc: Oral     SpO2: 94%  95% 94%  Weight:  86.4 kg    Height:       Physical Exam Vitals reviewed.  Constitutional:      General: She is not in acute distress.    Appearance: She is not ill-appearing or toxic-appearing.  Cardiovascular:     Rate and Rhythm: Normal rate and regular rhythm.     Heart sounds: No murmur heard. Pulmonary:     Effort: No respiratory distress.     Breath sounds: Wheezing present. No rhonchi or rales.     Comments: Mild increased respiratory effort.   Diffuse wheezes.  Frequent dry cough. Neurological:     Mental Status: She is alert.  Psychiatric:        Mood and Affect: Mood normal.        Behavior: Behavior normal.     Data Reviewed: No new data  Family Communication: none  Disposition: Status is: Inpatient Remains inpatient appropriate because: asthma exacerbation     Time spent: 20 minutes  Author: Jerline Moon, MD 11/27/2023 4:44 PM  For on call review [email or web address].

## 2023-11-27 NOTE — Progress Notes (Signed)
 Mobility Specialist - Progress Note   11/27/23 1157  Mobility  Activity Ambulated with assistance in hallway  Level of Assistance Standby assist, set-up cues, supervision of patient - no hands on  Assistive Device Front wheel walker  Distance Ambulated (ft) 40 ft  Activity Response Tolerated well  Mobility Referral Yes  Mobility visit 1 Mobility  Mobility Specialist Start Time (ACUTE ONLY) 1145  Mobility Specialist Stop Time (ACUTE ONLY) 1156  Mobility Specialist Time Calculation (min) (ACUTE ONLY) 11 min   Pt received in bed and agreeable to mobility. Pt some wheezing throughout session & SOB. Dyspnea with exertion. Upon returning to room, pt desat to 89%. With nurse approval, place pt on 1L [place] allowing SpO2 to come back up to 92%. No other complaints during session. Pt to bed after session with all needs met.    Pre-mobility: 93% SpO2 (RA) During mobility: 89%  SpO2 (RA) Post-mobility: 92% SPO2 (1L [place])  Chief Technology Officer

## 2023-11-27 NOTE — Plan of Care (Signed)

## 2023-11-28 ENCOUNTER — Telehealth: Payer: Self-pay | Admitting: Neurology

## 2023-11-28 DIAGNOSIS — I10 Essential (primary) hypertension: Secondary | ICD-10-CM | POA: Diagnosis not present

## 2023-11-28 DIAGNOSIS — I5032 Chronic diastolic (congestive) heart failure: Secondary | ICD-10-CM | POA: Diagnosis not present

## 2023-11-28 DIAGNOSIS — J4541 Moderate persistent asthma with (acute) exacerbation: Secondary | ICD-10-CM | POA: Diagnosis not present

## 2023-11-28 NOTE — Plan of Care (Signed)

## 2023-11-28 NOTE — Progress Notes (Signed)
 Mobility Specialist - Progress Note   11/28/23 1502  Oxygen Therapy  SpO2 91 %  O2 Device Nasal Cannula  O2 Flow Rate (L/min) 3 L/min  Patient Activity (if Appropriate) Ambulating  Mobility  Activity Ambulated with assistance in hallway  Level of Assistance Modified independent, requires aide device or extra time  Assistive Device Front wheel walker  Distance Ambulated (ft) 80 ft  Activity Response Tolerated well  Mobility Referral Yes  Mobility visit 1 Mobility  Mobility Specialist Start Time (ACUTE ONLY) 1442  Mobility Specialist Stop Time (ACUTE ONLY) 1502  Mobility Specialist Time Calculation (min) (ACUTE ONLY) 20 min   Pt received in bed and agreeable to mobility. Pt took x1 seated rest break d/t SOB. Audible wheezing throughout session. No complaints during session. Pt to bed after session with all needs met.    Pre-mobility: 100% SpO2 (3L [place]) During mobility: 91% SpO2 (3L [place]) Post-mobility: 99% SPO2 (3L )  Chief Technology Officer

## 2023-11-28 NOTE — Telephone Encounter (Signed)
 FYI-Pt called stating that she will be having to cx her MRI at GI and her PT due to being hospitalized. Pt wanted provider to be aware.

## 2023-11-28 NOTE — Progress Notes (Signed)
  Progress Note   Patient: [name] ZOX:[id number] DOB: Aug 04, 1950 DOA: 11/25/2023     2 DOS: the patient was seen and examined on 11/28/2023   Brief hospital course: 73 year old woman PMH including moderate persistent asthma, leukocytoclastic lymphoma in remission.  Presenting with cough congestion, dyspnea.  Admitted for acute asthma exacerbation. Slow to improve.   Consultants None   Procedures/Events None   Assessment and Plan: Moderate persistent asthma with acute exacerbation: Still not back to baseline, still wheezing today, frequent cough.   Continue IV steroids, bronchodilators, doxycycline , Hycodan   Chronic HFpEF Last EF 65-70% by TTE 06/02/2022.  Appears compensated.  Continue torsemide , spironolactone , losartan    Hypokalemia Potassium within normal limits on last check   Hypertension Stable.  Continue losartan , spironolactone , torsemide .   Iron deficiency anemia Hemoglobin stable on last check   Rheumatoid arthritis/SLE: Continue Plaquenil .   Mild thrombocytopenia, resolved   History of CVA Hyperlipidemia Continue aspirin  and rosuvastatin .   Insomnia Continue trazodone  nightly as needed.       Subjective:  Feels a little better, but still SOB and severe cough makes breathing worse  Physical Exam: Vitals:   11/28/23 0755 11/28/23 1353 11/28/23 1502 11/28/23 1518  BP:  (!) 133/95    Pulse:  69    Resp:  18    Temp:  97.6 F (36.4 C)    TempSrc:      SpO2: 95% 99% 91% 93%  Weight:      Height:       Physical Exam Vitals reviewed.  Constitutional:      General: She is not in acute distress.    Appearance: She is not ill-appearing or toxic-appearing.  Cardiovascular:     Rate and Rhythm: Normal rate and regular rhythm.     Heart sounds: No murmur heard. Pulmonary:     Effort: No respiratory distress.     Breath sounds: Wheezing present. No rales.     Comments: Mild increased respiratory effort. Neurological:     Mental Status:  She is alert.  Psychiatric:        Mood and Affect: Mood normal.        Behavior: Behavior normal.     Data Reviewed: No new data  Family Communication: Husband at bedside  Disposition: Status is: Inpatient Remains inpatient appropriate because: asthma     Time spent: 20 slow to improve.  Minutes  Author: Jerline Moon, MD 11/28/2023 6:09 PM  For on call review [email or web address].

## 2023-11-29 ENCOUNTER — Other Ambulatory Visit

## 2023-11-29 ENCOUNTER — Inpatient Hospital Stay (HOSPITAL_COMMUNITY)

## 2023-11-29 DIAGNOSIS — J4541 Moderate persistent asthma with (acute) exacerbation: Secondary | ICD-10-CM | POA: Diagnosis not present

## 2023-11-29 LAB — BASIC METABOLIC PANEL WITH GFR
Anion gap: 11 (ref 5–15)
BUN: 33 mg/dL — ABNORMAL HIGH (ref 8–23)
CO2: 26 mmol/L (ref 22–32)
Calcium: 8.9 mg/dL (ref 8.9–10.3)
Chloride: 102 mmol/L (ref 98–111)
Creatinine, Ser: 1.08 mg/dL — ABNORMAL HIGH (ref 0.44–1.00)
GFR, Estimated: 54 mL/min — ABNORMAL LOW (ref >60)
Glucose, Bld: 118 mg/dL — ABNORMAL HIGH (ref 70–99)
Potassium: 4 mmol/L (ref 3.5–5.1)
Sodium: 139 mmol/L (ref 135–145)

## 2023-11-29 LAB — RESPIRATORY PANEL BY PCR

## 2023-11-29 LAB — CBC
HCT: 36.6 % (ref 36.0–46.0)
Hemoglobin: 11.5 g/dL — ABNORMAL LOW (ref 12.0–15.0)
MCH: 32 pg (ref 26.0–34.0)
MCHC: 31.4 g/dL (ref 30.0–36.0)
MCV: 101.9 fL — ABNORMAL HIGH (ref 80.0–100.0)
Platelets: 189 10*3/uL (ref 150–400)
RBC: 3.59 MIL/uL — ABNORMAL LOW (ref 3.87–5.11)
RDW: 13 % (ref 11.5–15.5)
WBC: 10.9 10*3/uL — ABNORMAL HIGH (ref 4.0–10.5)
nRBC: 0 % (ref 0.0–0.2)

## 2023-11-29 MED ORDER — SALINE SPRAY 0.65 % NA SOLN
1.0000 | NASAL | Status: DC | PRN
  Administered 2023-11-30: 1 via NASAL
  Filled 2023-11-29: qty 44

## 2023-11-29 MED ORDER — LOSARTAN POTASSIUM 50 MG PO TABS
100.0000 mg | ORAL_TABLET | Freq: Every day | ORAL | Status: DC
  Administered 2023-11-30 – 2023-12-01 (×2): 100 mg via ORAL
  Filled 2023-11-29 (×2): qty 2

## 2023-11-29 MED ORDER — METHYLPREDNISOLONE SODIUM SUCC 125 MG IJ SOLR
81.2500 mg | Freq: Two times a day (BID) | INTRAMUSCULAR | Status: DC
  Administered 2023-11-29 – 2023-12-01 (×4): 81.25 mg via INTRAVENOUS
  Filled 2023-11-29 (×4): qty 2

## 2023-11-29 NOTE — Progress Notes (Signed)
 Mobility Specialist - Progress Note   11/29/23 1035  Oxygen Therapy  SpO2 97 %  O2 Device Room Air  Patient Activity (if Appropriate) Ambulating  Mobility  Activity Ambulated with assistance in hallway  Level of Assistance Standby assist, set-up cues, supervision of patient - no hands on  Assistive Device Front wheel walker  Distance Ambulated (ft) 80 ft  Activity Response Tolerated well  Mobility Referral Yes  Mobility visit 1 Mobility  Mobility Specialist Start Time (ACUTE ONLY) 1014  Mobility Specialist Stop Time (ACUTE ONLY) 1032  Mobility Specialist Time Calculation (min) (ACUTE ONLY) 18 min   Pt received in bed and agreeable to mobility. Despite being on room air, pt SOB & wheezing throughout session. After ambulating, ~18ft pt c/o feeling dizzy. Wheeled pt back to room. BP checked & recorded below. No other complaints during session.  Pt to bed after session with all needs met.     Pre-mobility:  98%  SpO2 (RA) During mobility:  97% SpO2 (RA) Post-mobility: 147/65 BP, 97% SPO2 (RA)  Chief Technology Officer

## 2023-11-29 NOTE — Progress Notes (Addendum)
 PROGRESS NOTE    KORINE WINTON  ZOX:[id number] DOB: 1951/05/24 DOA: 11/25/2023 PCP: Virgle Grime, MD    Brief Narrative:  73 year old woman PMH including moderate persistent asthma, leukocytoclastic lymphoma in remission.  Presenting with cough congestion, dyspnea.  Admitted for acute asthma exacerbation. Slow to improve.     Assessment and Plan:  Moderate persistent asthma with acute exacerbation: -Still not back to baseline, still wheezing today, frequent cough- dyspnea worse with exertion -Continue IV steroids, bronchodilators, doxycycline , Hycodan -added NP swab and humidified O2 - Recheck x-ray of lungs   Chronic HFpEF -Last EF 65-70% by TTE 06/02/2022.  -Appears compensated.  Continue torsemide , spironolactone , losartan  -check labs   Hypokalemia -repleted   Hypertension - Continue losartan , spironolactone , torsemide .   Iron deficiency anemia -Hemoglobin stable on last check   Rheumatoid arthritis/SLE: -Continue Plaquenil .   Mild thrombocytopenia, resolved   History of CVA Hyperlipidemia -Continue aspirin  and rosuvastatin .   Insomnia -Continue trazodone  nightly as needed.    DVT prophylaxis: enoxaparin  (LOVENOX ) injection 40 mg Start: 11/25/23 2200    Code Status: Full Code   Disposition Plan:  Level of care: Telemetry Status is: Inpatient       Subjective: Still very SOB at rest and worse with ambulation  Objective: Vitals:   11/28/23 1518 11/28/23 1948 11/29/23 0447 11/29/23 0905  BP:  (!) 120/47 (!) 156/68   Pulse:  73 63   Resp:  18 18   Temp:  98.1 F (36.7 C) 97.8 F (36.6 C)   TempSrc:  Oral    SpO2: 93% 99% 97% 98%  Weight:      Height:       No intake or output data in the 24 hours ending 11/29/23 1010 Filed Weights   11/26/23 0500 11/27/23 0500 11/28/23 0500  Weight: 88.5 kg 86.4 kg 83 kg    Examination:   General: Appearance:     Overweight female who appears SOB     Lungs:     On [place], wheezing/rhonchi    Heart:    Normal heart rate.     MS:   All extremities are intact.    Neurologic:   Awake, alert       Data Reviewed: I have personally reviewed following labs and imaging studies  CBC: Recent Labs  Lab 11/25/23 1835 11/26/23 0634  WBC 10.8* 10.8*  NEUTROABS 8.9*  --   HGB 11.3* 11.2*  HCT 35.2* 34.6*  MCV 100.3* 99.4  PLT 143* 152   Basic Metabolic Panel: Recent Labs  Lab 11/25/23 2000 11/26/23 0634  NA 139 138  K 3.0* 3.8  CL 106 106  CO2 22 24  GLUCOSE 158* 149*  BUN 11 13  CREATININE 0.94 0.90  CALCIUM  8.6* 9.0  MG  --  2.2   GFR: Estimated Creatinine Clearance: 58.7 mL/min (by C-G formula based on SCr of 0.9 mg/dL). Liver Function Tests: No results for input(s): "AST", "ALT", "ALKPHOS", "BILITOT", "PROT", "ALBUMIN" in the last 168 hours. No results for input(s): "LIPASE", "AMYLASE" in the last 168 hours. No results for input(s): "AMMONIA" in the last 168 hours. Coagulation Profile: No results for input(s): "INR", "PROTIME" in the last 168 hours. Cardiac Enzymes: No results for input(s): "CKTOTAL", "CKMB", "CKMBINDEX", "TROPONINI" in the last 168 hours. BNP (last 3 results) No results for input(s): "PROBNP" in the last 8760 hours. HbA1C: No results for input(s): "HGBA1C" in the last 72 hours. CBG: No results for input(s): "GLUCAP" in the last 168 hours. Lipid Profile: No  results for input(s): "CHOL", "HDL", "LDLCALC", "TRIG", "CHOLHDL", "LDLDIRECT" in the last 72 hours. Thyroid  Function Tests: No results for input(s): "TSH", "T4TOTAL", "FREET4", "T3FREE", "THYROIDAB" in the last 72 hours. Anemia Panel: No results for input(s): "VITAMINB12", "FOLATE", "FERRITIN", "TIBC", "IRON", "RETICCTPCT" in the last 72 hours. Sepsis Labs: No results for input(s): "PROCALCITON", "LATICACIDVEN" in the last 168 hours.  Recent Results (from the past 240 hours)  Resp panel by RT-PCR (RSV, Flu A&B, Covid) Anterior Nasal Swab     Status: None   Collection Time:  11/25/23  6:34 PM   Specimen: Anterior Nasal Swab  Result Value Ref Range Status   SARS Coronavirus 2 by RT PCR NEGATIVE NEGATIVE Final    Comment: (NOTE) SARS-CoV-2 target nucleic acids are NOT DETECTED.  The SARS-CoV-2 RNA is generally detectable in upper respiratory specimens during the acute phase of infection. The lowest concentration of SARS-CoV-2 viral copies this assay can detect is 138 copies/mL. A negative result does not preclude SARS-Cov-2 infection and should not be used as the sole basis for treatment or other patient management decisions. A negative result may occur with  improper specimen collection/handling, submission of specimen other than nasopharyngeal swab, presence of viral mutation(s) within the areas targeted by this assay, and inadequate number of viral copies(<138 copies/mL). A negative result must be combined with clinical observations, patient history, and epidemiological information. The expected result is Negative.  Fact Sheet for Patients:  BloggerCourse.com  Fact Sheet for Healthcare Providers:  SeriousBroker.it  This test is no t yet approved or cleared by the [place]  FDA and  has been authorized for detection and/or diagnosis of SARS-CoV-2 by FDA under an Emergency Use Authorization (EUA). This EUA will remain  in effect (meaning this test can be used) for the duration of the COVID-19 declaration under Section 564(b)(1) of the Act, 21 U.S.C.section 360bbb-3(b)(1), unless the authorization is terminated  or revoked sooner.       Influenza A by PCR NEGATIVE NEGATIVE Final   Influenza B by PCR NEGATIVE NEGATIVE Final    Comment: (NOTE) The Xpert Xpress SARS-CoV-2/FLU/RSV plus assay is intended as an aid in the diagnosis of influenza from Nasopharyngeal swab specimens and should not be used as a sole basis for treatment. Nasal washings and aspirates are unacceptable for Xpert Xpress  SARS-CoV-2/FLU/RSV testing.  Fact Sheet for Patients: BloggerCourse.com  Fact Sheet for Healthcare Providers: SeriousBroker.it  This test is not yet approved or cleared by the [place]  FDA and has been authorized for detection and/or diagnosis of SARS-CoV-2 by FDA under an Emergency Use Authorization (EUA). This EUA will remain in effect (meaning this test can be used) for the duration of the COVID-19 declaration under Section 564(b)(1) of the Act, 21 U.S.C. section 360bbb-3(b)(1), unless the authorization is terminated or revoked.     Resp Syncytial Virus by PCR NEGATIVE NEGATIVE Final    Comment: (NOTE) Fact Sheet for Patients: BloggerCourse.com  Fact Sheet for Healthcare Providers: SeriousBroker.it  This test is not yet approved or cleared by the [place]  FDA and has been authorized for detection and/or diagnosis of SARS-CoV-2 by FDA under an Emergency Use Authorization (EUA). This EUA will remain in effect (meaning this test can be used) for the duration of the COVID-19 declaration under Section 564(b)(1) of the Act, 21 U.S.C. section 360bbb-3(b)(1), unless the authorization is terminated or revoked.  Performed at [facility], [address] [place], [place] [address]          Radiology Studies:  No results found.      Scheduled Meds:  arformoterol  15 mcg Nebulization BID   aspirin   81 mg Oral Daily   budesonide (PULMICORT) nebulizer solution  0.25 mg Nebulization BID   doxycycline   100 mg Oral Q12H   enoxaparin  (LOVENOX ) injection  40 mg Subcutaneous Q24H   guaiFENesin  600 mg Oral BID   hydroxychloroquine   200 mg Oral Daily   [START ON 11/30/2023] losartan   100 mg Oral Daily   methylPREDNISolone  (SOLU-MEDROL ) injection  40 mg Intravenous Q12H   pantoprazole   40 mg Oral Daily   potassium chloride  SA  20 mEq Oral BID    rosuvastatin   20 mg Oral Daily   sodium chloride  flush  3 mL Intravenous Q12H   spironolactone   12.5 mg Oral Daily   torsemide   60 mg Oral Daily   Continuous Infusions:   LOS: 3 days    Time spent: 45 minutes spent on chart review, discussion with nursing staff, consultants, updating family and interview/physical exam; more than 50% of that time was spent in counseling and/or coordination of care.    Enrigue Harvard, DO [facility] Available via Epic secure chat 7am-7pm After these hours, please refer to coverage provider listed on [email or web address] 11/29/2023, 10:10 AM

## 2023-11-29 NOTE — Plan of Care (Signed)

## 2023-11-30 DIAGNOSIS — J4541 Moderate persistent asthma with (acute) exacerbation: Secondary | ICD-10-CM | POA: Diagnosis not present

## 2023-11-30 NOTE — Plan of Care (Signed)

## 2023-11-30 NOTE — Progress Notes (Signed)
 Mobility Specialist - Progress Note   11/30/23 1521  Mobility  Activity Ambulated with assistance in hallway  Level of Assistance Modified independent, requires aide device or extra time  Assistive Device Front wheel walker  Distance Ambulated (ft) 250 ft  Activity Response Tolerated well  Mobility Referral Yes  Mobility visit 1 Mobility  Mobility Specialist Start Time (ACUTE ONLY) 1442  Mobility Specialist Stop Time (ACUTE ONLY) 1520  Mobility Specialist Time Calculation (min) (ACUTE ONLY) 38 min   Pt received in bed and agreeable to mobility. No complaints during session. Pt to bed after session with all needs met.    During mobility: 98%SpO2 (RA) Post-mobility: 100% SPO2 (RA)  Chief Technology Officer

## 2023-11-30 NOTE — Progress Notes (Signed)
 PROGRESS NOTE    [name]  LKG:[id number] DOB: 05-25-1951 DOA: 11/25/2023 PCP: Virgle Grime, MD    Brief Narrative:  73 year old woman PMH including moderate persistent asthma, leukocytoclastic lymphoma in remission.  Presenting with cough congestion, dyspnea.  Admitted for acute asthma exacerbation. Slow to improve.     Assessment and Plan:  Moderate persistent asthma with acute exacerbation: due to entero/rhinovirus -Still not back to baseline, still wheezing today, frequent cough- dyspnea worse with exertion -Continue IV steroids, bronchodilators, doxycycline , Hycodan - x ray stable -wean O2 as able   Chronic HFpEF -Last EF 65-70% by TTE 06/02/2022.  -Appears compensated.  Continue torsemide , spironolactone , losartan  -labs rechecked   Hypokalemia -repleted   Hypertension - Continue losartan , spironolactone , torsemide .   Iron deficiency anemia -Hemoglobin stable on last check   Rheumatoid arthritis/SLE: -Continue Plaquenil .   Mild thrombocytopenia, resolved   History of CVA Hyperlipidemia -Continue aspirin  and rosuvastatin .   Insomnia -Continue trazodone  nightly as needed.    DVT prophylaxis: enoxaparin  (LOVENOX ) injection 40 mg Start: 11/25/23 2200    Code Status: Full Code   Disposition Plan:  Level of care: Telemetry Status is: Inpatient   Suspect home in the AM      Subjective: Breathing better today- cough is still very disruptive with movement  Objective: Vitals:   11/29/23 1935 11/30/23 0500 11/30/23 0508 11/30/23 0850  BP:   (!) 148/69   Pulse:   65   Resp:   18   Temp:   98.2 F (36.8 C)   TempSrc:      SpO2: 99%  97% 97%  Weight:  86.3 kg    Height:       No intake or output data in the 24 hours ending 11/30/23 1127 Filed Weights   11/27/23 0500 11/28/23 0500 11/30/23 0500  Weight: 86.4 kg 83 kg 86.3 kg    Examination:   General: Appearance:     Overweight female who appears SOB     Lungs:     On [place],  diminished, no wheezing  Heart:    Normal heart rate.     MS:   All extremities are intact.    Neurologic:   Awake, alert       Data Reviewed: I have personally reviewed following labs and imaging studies  CBC: Recent Labs  Lab 11/25/23 1835 11/26/23 0634 11/29/23 0950  WBC 10.8* 10.8* 10.9*  NEUTROABS 8.9*  --   --   HGB 11.3* 11.2* 11.5*  HCT 35.2* 34.6* 36.6  MCV 100.3* 99.4 101.9*  PLT 143* 152 189   Basic Metabolic Panel: Recent Labs  Lab 11/25/23 2000 11/26/23 0634 11/29/23 0950  NA 139 138 139  K 3.0* 3.8 4.0  CL 106 106 102  CO2 22 24 26   GLUCOSE 158* 149* 118*  BUN 11 13 33*  CREATININE 0.94 0.90 1.08*  CALCIUM  8.6* 9.0 8.9  MG  --  2.2  --    GFR: Estimated Creatinine Clearance: 49.8 mL/min (A) (by C-G formula based on SCr of 1.08 mg/dL (H)). Liver Function Tests: No results for input(s): "AST", "ALT", "ALKPHOS", "BILITOT", "PROT", "ALBUMIN" in the last 168 hours. No results for input(s): "LIPASE", "AMYLASE" in the last 168 hours. No results for input(s): "AMMONIA" in the last 168 hours. Coagulation Profile: No results for input(s): "INR", "PROTIME" in the last 168 hours. Cardiac Enzymes: No results for input(s): "CKTOTAL", "CKMB", "CKMBINDEX", "TROPONINI" in the last 168 hours. BNP (last 3 results) No results for input(s): "PROBNP"  in the last 8760 hours. HbA1C: No results for input(s): "HGBA1C" in the last 72 hours. CBG: No results for input(s): "GLUCAP" in the last 168 hours. Lipid Profile: No results for input(s): "CHOL", "HDL", "LDLCALC", "TRIG", "CHOLHDL", "LDLDIRECT" in the last 72 hours. Thyroid  Function Tests: No results for input(s): "TSH", "T4TOTAL", "FREET4", "T3FREE", "THYROIDAB" in the last 72 hours. Anemia Panel: No results for input(s): "VITAMINB12", "FOLATE", "FERRITIN", "TIBC", "IRON", "RETICCTPCT" in the last 72 hours. Sepsis Labs: No results for input(s): "PROCALCITON", "LATICACIDVEN" in the last 168 hours.  Recent  Results (from the past 240 hours)  Resp panel by RT-PCR (RSV, Flu A&B, Covid) Anterior Nasal Swab     Status: None   Collection Time: 11/25/23  6:34 PM   Specimen: Anterior Nasal Swab  Result Value Ref Range Status   SARS Coronavirus 2 by RT PCR NEGATIVE NEGATIVE Final    Comment: (NOTE) SARS-CoV-2 target nucleic acids are NOT DETECTED.  The SARS-CoV-2 RNA is generally detectable in upper respiratory specimens during the acute phase of infection. The lowest concentration of SARS-CoV-2 viral copies this assay can detect is 138 copies/mL. A negative result does not preclude SARS-Cov-2 infection and should not be used as the sole basis for treatment or other patient management decisions. A negative result may occur with  improper specimen collection/handling, submission of specimen other than nasopharyngeal swab, presence of viral mutation(s) within the areas targeted by this assay, and inadequate number of viral copies(<138 copies/mL). A negative result must be combined with clinical observations, patient history, and epidemiological information. The expected result is Negative.  Fact Sheet for Patients:  BloggerCourse.com  Fact Sheet for Healthcare Providers:  SeriousBroker.it  This test is no t yet approved or cleared by the [place]  FDA and  has been authorized for detection and/or diagnosis of SARS-CoV-2 by FDA under an Emergency Use Authorization (EUA). This EUA will remain  in effect (meaning this test can be used) for the duration of the COVID-19 declaration under Section 564(b)(1) of the Act, 21 U.S.C.section 360bbb-3(b)(1), unless the authorization is terminated  or revoked sooner.       Influenza A by PCR NEGATIVE NEGATIVE Final   Influenza B by PCR NEGATIVE NEGATIVE Final    Comment: (NOTE) The Xpert Xpress SARS-CoV-2/FLU/RSV plus assay is intended as an aid in the diagnosis of influenza from Nasopharyngeal swab  specimens and should not be used as a sole basis for treatment. Nasal washings and aspirates are unacceptable for Xpert Xpress SARS-CoV-2/FLU/RSV testing.  Fact Sheet for Patients: BloggerCourse.com  Fact Sheet for Healthcare Providers: SeriousBroker.it  This test is not yet approved or cleared by the [place]  FDA and has been authorized for detection and/or diagnosis of SARS-CoV-2 by FDA under an Emergency Use Authorization (EUA). This EUA will remain in effect (meaning this test can be used) for the duration of the COVID-19 declaration under Section 564(b)(1) of the Act, 21 U.S.C. section 360bbb-3(b)(1), unless the authorization is terminated or revoked.     Resp Syncytial Virus by PCR NEGATIVE NEGATIVE Final    Comment: (NOTE) Fact Sheet for Patients: BloggerCourse.com  Fact Sheet for Healthcare Providers: SeriousBroker.it  This test is not yet approved or cleared by the [place]  FDA and has been authorized for detection and/or diagnosis of SARS-CoV-2 by FDA under an Emergency Use Authorization (EUA). This EUA will remain in effect (meaning this test can be used) for the duration of the COVID-19 declaration under Section 564(b)(1) of the Act, 21 U.S.C. section 360bbb-3(b)(1), unless the  authorization is terminated or revoked.  Performed at [facility], [address], [place], [place] [address]   Respiratory (~20 pathogens) panel by PCR     Status: Abnormal   Collection Time: 11/29/23  9:35 AM   Specimen: Nasopharyngeal Swab; Respiratory  Result Value Ref Range Status   Adenovirus NOT DETECTED NOT DETECTED Final   Coronavirus 229E NOT DETECTED NOT DETECTED Final    Comment: (NOTE) The Coronavirus on the Respiratory Panel, DOES NOT test for the novel  Coronavirus (2019 nCoV)    Coronavirus HKU1 NOT DETECTED NOT DETECTED Final   Coronavirus  NL63 NOT DETECTED NOT DETECTED Final   Coronavirus OC43 NOT DETECTED NOT DETECTED Final   Metapneumovirus NOT DETECTED NOT DETECTED Final   Rhinovirus / Enterovirus DETECTED (A) NOT DETECTED Final   Influenza A NOT DETECTED NOT DETECTED Final   Influenza B NOT DETECTED NOT DETECTED Final   Parainfluenza Virus 1 NOT DETECTED NOT DETECTED Final   Parainfluenza Virus 2 NOT DETECTED NOT DETECTED Final   Parainfluenza Virus 3 NOT DETECTED NOT DETECTED Final   Parainfluenza Virus 4 NOT DETECTED NOT DETECTED Final   Respiratory Syncytial Virus NOT DETECTED NOT DETECTED Final   Bordetella pertussis NOT DETECTED NOT DETECTED Final   Bordetella Parapertussis NOT DETECTED NOT DETECTED Final   Chlamydophila pneumoniae NOT DETECTED NOT DETECTED Final   Mycoplasma pneumoniae NOT DETECTED NOT DETECTED Final    Comment: Performed at [facility], [address] [place], [place] [address]         Radiology Studies: DG Chest 2 View Result Date: 11/29/2023 CLINICAL DATA:  Dyspnea EXAM: CHEST - 2 VIEW COMPARISON:  11/25/2023, 01/20/2022 8 FINDINGS: Hardware in the cervical spine and left shoulder. No acute airspace disease, pleural effusion or pneumothorax. Upper normal cardiac size. IMPRESSION: No active cardiopulmonary disease. Electronically Signed   By: Esmeralda Hedge M.D.   On: 11/29/2023 18:22        Scheduled Meds:  arformoterol  15 mcg Nebulization BID   aspirin   81 mg Oral Daily   budesonide (PULMICORT) nebulizer solution  0.25 mg Nebulization BID   doxycycline   100 mg Oral Q12H   enoxaparin  (LOVENOX ) injection  40 mg Subcutaneous Q24H   guaiFENesin  600 mg Oral BID   hydroxychloroquine   200 mg Oral Daily   losartan   100 mg Oral Daily   methylPREDNISolone  (SOLU-MEDROL ) injection  81.25 mg Intravenous Q12H   pantoprazole   40 mg Oral Daily   potassium chloride  SA  20 mEq Oral BID   rosuvastatin   20 mg Oral Daily   sodium chloride  flush  3 mL Intravenous Q12H   spironolactone    12.5 mg Oral Daily   torsemide   60 mg Oral Daily   Continuous Infusions:   LOS: 4 days    Time spent: 45 minutes spent on chart review, discussion with nursing staff, consultants, updating family and interview/physical exam; more than 50% of that time was spent in counseling and/or coordination of care.    Enrigue Harvard, DO [facility] Available via Epic secure chat 7am-7pm After these hours, please refer to coverage provider listed on [email or web address] 11/30/2023, 11:27 AM

## 2023-11-30 NOTE — Plan of Care (Signed)
  Problem: Safety: Goal: Ability to remain free from injury will improve Outcome: Progressing   Problem: Skin Integrity: Goal: Risk for impaired skin integrity will decrease Outcome: Progressing   Problem: Elimination: Goal: Will not experience complications related to bowel motility Outcome: Progressing   Problem: Nutrition: Goal: Adequate nutrition will be maintained Outcome: Progressing   Problem: Activity: Goal: Risk for activity intolerance will decrease Outcome: Progressing

## 2023-12-01 ENCOUNTER — Encounter: Payer: Self-pay | Admitting: Hematology & Oncology

## 2023-12-01 ENCOUNTER — Ambulatory Visit

## 2023-12-01 ENCOUNTER — Other Ambulatory Visit (HOSPITAL_COMMUNITY): Payer: Self-pay

## 2023-12-01 DIAGNOSIS — J4541 Moderate persistent asthma with (acute) exacerbation: Secondary | ICD-10-CM | POA: Diagnosis not present

## 2023-12-01 MED ORDER — ALBUTEROL SULFATE (2.5 MG/3ML) 0.083% IN NEBU
2.5000 mg | INHALATION_SOLUTION | RESPIRATORY_TRACT | 0 refills | Status: DC | PRN
  Filled 2023-12-01: qty 75, 5d supply, fill #0

## 2023-12-01 MED ORDER — ARFORMOTEROL TARTRATE 15 MCG/2ML IN NEBU
15.0000 ug | INHALATION_SOLUTION | Freq: Two times a day (BID) | RESPIRATORY_TRACT | 0 refills | Status: DC
  Filled 2023-12-01: qty 120, 30d supply, fill #0

## 2023-12-01 MED ORDER — MENTHOL 3 MG MT LOZG: 1.0000 | LOZENGE | OROMUCOSAL | Status: DC | PRN

## 2023-12-01 MED ORDER — PREDNISONE 50 MG PO TABS
50.0000 mg | ORAL_TABLET | Freq: Every day | ORAL | 0 refills | Status: AC
  Filled 2023-12-01: qty 5, 5d supply, fill #0

## 2023-12-01 MED ORDER — GUAIFENESIN ER 600 MG PO TB12: 600.0000 mg | ORAL_TABLET | Freq: Two times a day (BID) | ORAL | Status: DC

## 2023-12-01 MED ORDER — HYDROCODONE BIT-HOMATROP MBR 5-1.5 MG/5ML PO SOLN
5.0000 mL | Freq: Four times a day (QID) | ORAL | 0 refills | Status: DC | PRN
  Filled 2023-12-01: qty 120, 6d supply, fill #0

## 2023-12-01 MED ORDER — BUDESONIDE 0.25 MG/2ML IN SUSP
0.2500 mg | Freq: Two times a day (BID) | RESPIRATORY_TRACT | 0 refills | Status: DC
  Filled 2023-12-01: qty 60, 15d supply, fill #0

## 2023-12-01 NOTE — Plan of Care (Signed)

## 2023-12-01 NOTE — Progress Notes (Signed)
 Discharge medication delivered to patient at bedside [facility]

## 2023-12-01 NOTE — Care Management Important Message (Signed)
 Important Message  Patient Details IM Letter given. Name: [name] MRN: [id number] Date of Birth: 1950/07/28   Important Message Given:  Yes - Medicare IM     Alfonzo Arca 12/01/2023, 11:40 AM

## 2023-12-01 NOTE — TOC Transition Note (Signed)
 Transition of Care [facility]) - Discharge Note   Patient Details  Name: [name] MRN: [id number] Date of Birth: [birth year]  [facility]) CM/SW Contact:  Tessie Fila, RN Phone Number: 12/01/2023, 12:13 PM   Clinical Narrative:    Pt is discharging home. Pt spouse will provide transportation at time of discharge. Nebulizer ordered through [employer]. Spoke with Mitch and he will deliver nebulizer to pt room before discharge. There are no other TOC needs at this time.     Barriers to Discharge: Continued Medical Work up   Patient Goals and CMS Choice Patient states their goals for this hospitalization and ongoing recovery are:: home CMS Medicare.gov Compare Post Acute Care list provided to:: Patient   [place] ownership interest in [facility]provided to:: Patient    Discharge Placement                       Discharge Plan and Services Additional resources added to the After Visit Summary for     Discharge Planning Services: CM Consult                                 Social Drivers of Health (SDOH) Interventions SDOH Screenings   Food Insecurity: No Food Insecurity (11/26/2023)  Housing: Low Risk  (11/26/2023)  Transportation Needs: No Transportation Needs (11/26/2023)  Utilities: Not At Risk (11/26/2023)  Social Connections: Moderately Integrated (11/25/2023)  Tobacco Use: Medium Risk (11/25/2023)     Readmission Risk Interventions     No data to display

## 2023-12-01 NOTE — Discharge Summary (Signed)
 Physician Discharge Summary  [name] ZOX:[id number] DOB: 08-10-1950 DOA: 11/25/2023  PCP: Virgle Grime, MD  Admit date: 11/25/2023 Discharge date: 12/01/2023  Admitted From: home Discharge disposition: home   Recommendations for Outpatient Follow-Up:      Discharge Diagnosis:   Principal Problem:   Moderate persistent asthma with allergic rhinitis without status asthmaticus with acute exacerbation Active Problems:   Chronic heart failure with preserved ejection fraction (HFpEF, >= 50%) (HCC)   Lupus erythematosus   Essential hypertension   Pure hypercholesterolemia   Iron deficiency anemia   Hypokalemia    Discharge Condition: Improved.  Diet recommendation: Low sodium, heart healthy.  Wound care: None.  Code status: Full.   History of Present Illness:   [name] is a 73 y.o. female with medical history significant for chronic HFpEF, moderate persistent asthma, HTN, HLD, RA, SLE, iron deficiency anemia, lymphoplasmacytic lymphoma in remission who presented to the ED for evaluation of shortness of breath.   Patient reports that she has a history of allergic rhinitis that is easily triggered by noxious smells.  She says she was shopping 6 days ago when she smelled someone's very strong perfume.  She then developed sinus congestion with thick yellow rhinorrhea.  The following day she started to feel short of breath and wheezy consistent with exacerbation of her asthma.  She has had cough and chest congestion but has been unable to bring up any sputum.  She has been using her home inhalers without significant relief.   Dyspnea worsened and she called EMS.  She was given Solu-Medrol , albuterol /Atrovent nebulizers by EMS en route to the ED.  Her SpO2 was 94%.   Patient otherwise denies fevers, chills, diaphoresis, chest pain, dysuria, lower extremity edema.   ED Course  Labs/Imaging on admission: I have personally reviewed following labs and  imaging studies.   Initial vitals showed BP 131/65, pulse 108, RR 25, temp 98.9 F, SpO2 97% on room air.   Labs showed sodium 139, potassium 3.0, bicarb 22, BUN 11, creatinine 0.94, serum glucose 158, BNP 142.7, troponin 38 > 43, WBC 10.8, hemoglobin 11.3, platelets 143.   SARS-CoV-2, influenza, RSV PCR negative.   2 view chest x-ray shows enlarged cardiopericardial silhouette with some vascular congestion.  No focal consolidation, pneumothorax, or effusion.   Patient was given IV Solu-Medrol  125 mg, IV magnesium  2 g, oral K 40 mEq, and DuoNeb treatment x 2.  The hospitalist service was consulted to admit.     Hospital Course by Problem:   Moderate persistent asthma with acute exacerbation: due to entero/rhinovirus - Much improved on day of discharge but still not back to baseline in regards to cough-suspect cough may linger for several weeks -P.o. steroids and nebulizers (changed back to inhalers once exacerbation over) - x ray stable - off O2   Chronic HFpEF -Last EF 65-70% by TTE 06/02/2022.  -Appears compensated.  Continue torsemide , spironolactone , losartan  -labs rechecked   Hypokalemia -repleted   Hypertension - Continue losartan , spironolactone , torsemide .   Iron deficiency anemia -Hemoglobin stable on last check   Rheumatoid arthritis/SLE: -Continue Plaquenil .   Mild thrombocytopenia, resolved   History of CVA Hyperlipidemia -Continue aspirin  and rosuvastatin .   Insomnia -Continue trazodone  nightly as needed.      Medical Consultants:      Discharge Exam:   Vitals:   12/01/23 0523 12/01/23 0842  BP: 131/63   Pulse: 62   Resp: 18   Temp: 97.6 F (36.4 C)  SpO2: 95% 95%   Vitals:   11/30/23 2038 12/01/23 0500 12/01/23 0523 12/01/23 0842  BP: (!) 127/53  131/63   Pulse: 60  62   Resp: 18  18   Temp:   97.6 F (36.4 C)   TempSrc:      SpO2: 100%  95% 95%  Weight:  86.5 kg    Height:        General exam: Appears calm and  comfortable.   The results of significant diagnostics from this hospitalization (including imaging, microbiology, ancillary and laboratory) are listed below for reference.     Procedures and Diagnostic Studies:   DG Chest 2 View Result Date: 11/25/2023 CLINICAL DATA:  Shortness of breath EXAM: CHEST - 2 VIEW COMPARISON:  Chest x-ray 02/10/2022 and older FINDINGS: Enlarged cardiopericardial silhouette with some prominence of central vasculature. No consolidation, pneumothorax or effusion. Mild interstitial prominence. Films are under penetrated. Degenerative changes along the spine. Overlapping cardiac leads. Left shoulder reverse arthroplasty. Degenerative changes of the right shoulder with surgical Burr. IMPRESSION: Enlarged cardiopericardial silhouette with some vascular congestion. Under penetrated radiographs. Electronically Signed   By: Adrianna Horde M.D.   On: 11/25/2023 19:11     Labs:   Basic Metabolic Panel: Recent Labs  Lab 11/25/23 2000 11/26/23 0634 11/29/23 0950  NA 139 138 139  K 3.0* 3.8 4.0  CL 106 106 102  CO2 22 24 26   GLUCOSE 158* 149* 118*  BUN 11 13 33*  CREATININE 0.94 0.90 1.08*  CALCIUM  8.6* 9.0 8.9  MG  --  2.2  --    GFR Estimated Creatinine Clearance: 49.9 mL/min (A) (by C-G formula based on SCr of 1.08 mg/dL (H)). Liver Function Tests: No results for input(s): "AST", "ALT", "ALKPHOS", "BILITOT", "PROT", "ALBUMIN" in the last 168 hours. No results for input(s): "LIPASE", "AMYLASE" in the last 168 hours. No results for input(s): "AMMONIA" in the last 168 hours. Coagulation profile No results for input(s): "INR", "PROTIME" in the last 168 hours.  CBC: Recent Labs  Lab 11/25/23 1835 11/26/23 0634 11/29/23 0950  WBC 10.8* 10.8* 10.9*  NEUTROABS 8.9*  --   --   HGB 11.3* 11.2* 11.5*  HCT 35.2* 34.6* 36.6  MCV 100.3* 99.4 101.9*  PLT 143* 152 189   Cardiac Enzymes: No results for input(s): "CKTOTAL", "CKMB", "CKMBINDEX", "TROPONINI" in the last  168 hours. BNP: Invalid input(s): "POCBNP" CBG: No results for input(s): "GLUCAP" in the last 168 hours. D-Dimer No results for input(s): "DDIMER" in the last 72 hours. Hgb A1c No results for input(s): "HGBA1C" in the last 72 hours. Lipid Profile No results for input(s): "CHOL", "HDL", "LDLCALC", "TRIG", "CHOLHDL", "LDLDIRECT" in the last 72 hours. Thyroid  function studies No results for input(s): "TSH", "T4TOTAL", "T3FREE", "THYROIDAB" in the last 72 hours.  Invalid input(s): "FREET3" Anemia work up No results for input(s): "VITAMINB12", "FOLATE", "FERRITIN", "TIBC", "IRON", "RETICCTPCT" in the last 72 hours. Microbiology Recent Results (from the past 240 hours)  Resp panel by RT-PCR (RSV, Flu A&B, Covid) Anterior Nasal Swab     Status: None   Collection Time: 11/25/23  6:34 PM   Specimen: Anterior Nasal Swab  Result Value Ref Range Status   SARS Coronavirus 2 by RT PCR NEGATIVE NEGATIVE Final    Comment: (NOTE) SARS-CoV-2 target nucleic acids are NOT DETECTED.  The SARS-CoV-2 RNA is generally detectable in upper respiratory specimens during the acute phase of infection. The lowest concentration of SARS-CoV-2 viral copies this assay can detect is 138 copies/mL.  A negative result does not preclude SARS-Cov-2 infection and should not be used as the sole basis for treatment or other patient management decisions. A negative result may occur with  improper specimen collection/handling, submission of specimen other than nasopharyngeal swab, presence of viral mutation(s) within the areas targeted by this assay, and inadequate number of viral copies(<138 copies/mL). A negative result must be combined with clinical observations, patient history, and epidemiological information. The expected result is Negative.  Fact Sheet for Patients:  BloggerCourse.com  Fact Sheet for Healthcare Providers:  SeriousBroker.it  This test is no t  yet approved or cleared by the [place]  FDA and  has been authorized for detection and/or diagnosis of SARS-CoV-2 by FDA under an Emergency Use Authorization (EUA). This EUA will remain  in effect (meaning this test can be used) for the duration of the COVID-19 declaration under Section 564(b)(1) of the Act, 21 U.S.C.section 360bbb-3(b)(1), unless the authorization is terminated  or revoked sooner.       Influenza A by PCR NEGATIVE NEGATIVE Final   Influenza B by PCR NEGATIVE NEGATIVE Final    Comment: (NOTE) The Xpert Xpress SARS-CoV-2/FLU/RSV plus assay is intended as an aid in the diagnosis of influenza from Nasopharyngeal swab specimens and should not be used as a sole basis for treatment. Nasal washings and aspirates are unacceptable for Xpert Xpress SARS-CoV-2/FLU/RSV testing.  Fact Sheet for Patients: BloggerCourse.com  Fact Sheet for Healthcare Providers: SeriousBroker.it  This test is not yet approved or cleared by the [place]  FDA and has been authorized for detection and/or diagnosis of SARS-CoV-2 by FDA under an Emergency Use Authorization (EUA). This EUA will remain in effect (meaning this test can be used) for the duration of the COVID-19 declaration under Section 564(b)(1) of the Act, 21 U.S.C. section 360bbb-3(b)(1), unless the authorization is terminated or revoked.     Resp Syncytial Virus by PCR NEGATIVE NEGATIVE Final    Comment: (NOTE) Fact Sheet for Patients: BloggerCourse.com  Fact Sheet for Healthcare Providers: SeriousBroker.it  This test is not yet approved or cleared by the [place]  FDA and has been authorized for detection and/or diagnosis of SARS-CoV-2 by FDA under an Emergency Use Authorization (EUA). This EUA will remain in effect (meaning this test can be used) for the duration of the COVID-19 declaration under Section 564(b)(1)  of the Act, 21 U.S.C. section 360bbb-3(b)(1), unless the authorization is terminated or revoked.  Performed at [facility], [address], [place], [place] [address]   Respiratory (~20 pathogens) panel by PCR     Status: Abnormal   Collection Time: 11/29/23  9:35 AM   Specimen: Nasopharyngeal Swab; Respiratory  Result Value Ref Range Status   Adenovirus NOT DETECTED NOT DETECTED Final   Coronavirus 229E NOT DETECTED NOT DETECTED Final    Comment: (NOTE) The Coronavirus on the Respiratory Panel, DOES NOT test for the novel  Coronavirus (2019 nCoV)    Coronavirus HKU1 NOT DETECTED NOT DETECTED Final   Coronavirus NL63 NOT DETECTED NOT DETECTED Final   Coronavirus OC43 NOT DETECTED NOT DETECTED Final   Metapneumovirus NOT DETECTED NOT DETECTED Final   Rhinovirus / Enterovirus DETECTED (A) NOT DETECTED Final   Influenza A NOT DETECTED NOT DETECTED Final   Influenza B NOT DETECTED NOT DETECTED Final   Parainfluenza Virus 1 NOT DETECTED NOT DETECTED Final   Parainfluenza Virus 2 NOT DETECTED NOT DETECTED Final   Parainfluenza Virus 3 NOT DETECTED NOT DETECTED Final   Parainfluenza Virus 4 NOT  DETECTED NOT DETECTED Final   Respiratory Syncytial Virus NOT DETECTED NOT DETECTED Final   Bordetella pertussis NOT DETECTED NOT DETECTED Final   Bordetella Parapertussis NOT DETECTED NOT DETECTED Final   Chlamydophila pneumoniae NOT DETECTED NOT DETECTED Final   Mycoplasma pneumoniae NOT DETECTED NOT DETECTED Final    Comment: Performed at [facility], [address] [place], [place] [address]     Discharge Instructions:   Discharge Instructions     Diet - low sodium heart healthy   Complete by: As directed    Increase activity slowly   Complete by: As directed       Allergies as of 12/01/2023       Reactions   Iodinated Contrast Media Other (See Comments)   Kidney failure.    Erythromycin  Base Itching   Red streaks on arms   Other Cough   Smells of  perfume, body wash, lotion, gas, and paint.  Wheezing    Ciprofloxacin Itching, Rash   Oxycodone Other (See Comments)   Hallucination.        Medication List     PAUSE taking these medications    albuterol  108 (90 Base) MCG/ACT inhaler Wait to take this until your doctor or other care provider tells you to start again. Commonly known as: VENTOLIN  HFA Inhale 2 puffs into the lungs every 6 (six) hours as needed for wheezing or shortness of breath. You also have another medication with the same name that you may need to continue taking.   fluticasone -salmeterol 230-21 MCG/ACT inhaler Wait to take this until your doctor or other care provider tells you to start again. Commonly known as: Advair  HFA Inhale 2 puffs into the lungs 2 (two) times daily.       STOP taking these medications    brompheniramine-pseudoephedrine-DM 30-2-10 MG/5ML syrup       TAKE these medications    acetaminophen  500 MG tablet Commonly known as: TYLENOL  Take 1,000 mg by mouth every 6 (six) hours as needed for mild pain or headache.   albuterol  (2.5 MG/3ML) 0.083% nebulizer solution Commonly known as: PROVENTIL  Take 3 mLs (2.5 mg total) by nebulization every 4 (four) hours as needed for wheezing or shortness of breath. What changed: Another medication with the same name was paused. Ask your nurse or doctor if you should take this medication.   arformoterol 15 MCG/2ML Nebu Commonly known as: BROVANA Take 2 mLs (15 mcg total) by nebulization 2 (two) times daily.   aspirin  81 MG chewable tablet Chew 81 mg by mouth daily.   budesonide 0.25 MG/2ML nebulizer solution Commonly known as: PULMICORT Take 2 mLs (0.25 mg total) by nebulization 2 (two) times daily.   carboxymethylcellulose 0.5 % Soln Commonly known as: REFRESH PLUS Place 2 drops into both eyes daily as needed (dry eye).   cholecalciferol  1000 units tablet Commonly known as: VITAMIN D  Take 1,000 Units by mouth daily.    fluorometholone 0.1 % ophthalmic suspension Commonly known as: EFLONE Place 2 drops into the left eye 2 (two) times daily.   guaiFENesin 600 MG 12 hr tablet Commonly known as: MUCINEX Take 1 tablet (600 mg total) by mouth 2 (two) times daily.   HYDROcodone  bit-homatropine 5-1.5 MG/5ML syrup Commonly known as: HYCODAN Take 5 mLs by mouth every 6 (six) hours as needed for cough.   hydroxychloroquine  200 MG tablet Commonly known as: PLAQUENIL  Take 200 mg by mouth daily.   latanoprost  0.005 % ophthalmic solution Commonly known as: XALATAN  Place 1  drop into both eyes at bedtime.   losartan  100 MG tablet Commonly known as: COZAAR  Take 100 mg by mouth daily.   pantoprazole  40 MG tablet Commonly known as: PROTONIX  Take 40 mg by mouth daily.   potassium chloride  SA 20 MEQ tablet Commonly known as: KLOR-CON  M Take 1 tablet (20 mEq total) by mouth 2 (two) times daily. 2 in the morning and 1 in the evening What changed: additional instructions   predniSONE  50 MG tablet Commonly known as: DELTASONE  Take 1 tablet (50 mg total) by mouth daily with breakfast for 5 days.   Probiotic (Lactobacillus) Caps Take 1 capsule by mouth daily.   rosuvastatin  20 MG tablet Commonly known as: CRESTOR  Take 1 tablet (20 mg total) by mouth daily.   spironolactone  25 MG tablet Commonly known as: ALDACTONE  Take 0.5 tablets (12.5 mg total) by mouth daily.   torsemide  20 MG tablet Commonly known as: DEMADEX  Take 3 tablets by mouth once daily   traZODone  50 MG tablet Commonly known as: DESYREL  Take 1 tablet (50 mg total) by mouth at bedtime as needed for sleep. What changed: when to take this   Turmeric 500 MG Caps Take 500 mg by mouth daily.   valACYclovir  1000 MG tablet Commonly known as: VALTREX  Take 1,000 mg by mouth 2 (two) times daily as needed (breakouts).               Durable Medical Equipment  (From admission, onward)           Start     Ordered   12/01/23 0925   For home use only DME Nebulizer machine  Once       Question Answer Comment  Patient needs a nebulizer to treat with the following condition Asthma   Length of Need 6 Months   Additional equipment included Administration kit      12/01/23 0924              Time coordinating discharge: 45 min  Signed:  Enrigue Harvard DO  [facility] 12/01/2023, 11:19 AM

## 2023-12-04 ENCOUNTER — Telehealth: Payer: Self-pay | Admitting: *Deleted

## 2023-12-04 NOTE — Transitions of Care (Post Inpatient/ED Visit) (Signed)
 12/04/2023  Name: [name] MRN: [id number] DOB: 1950-10-28  Today's TOC FU Call Status: Today's TOC FU Call Status:: Successful TOC FU Call Completed TOC FU Call Complete Date: 12/04/23 Patient's Name and Date of Birth confirmed.  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 with acute exacerbation How have you been since you were released from the hospital?: Better Any questions or concerns?: No  Items Reviewed: Did you receive and understand the discharge instructions provided?: Yes Medications obtained,verified, and reconciled?: Yes (Medications Reviewed) Any new allergies since your discharge?: No Dietary orders reviewed?: Yes Type of Diet Ordered:: low sodium heart healthy Do you have support at home?: Yes People in Home [RPT]: spouse Name of Support/Comfort Primary Source: Rickie  Medications Reviewed Today: Medications Reviewed Today     Reviewed by Eilene Grater, RN (Case Manager) on 12/04/23 at 1158  Med List Status: <None>   Medication Order Taking? Sig Documenting Provider Last Dose Status Informant  acetaminophen  (TYLENOL ) 500 MG tablet [id number] Yes Take 1,000 mg by mouth every 6 (six) hours as needed for mild pain or headache. [provider] Taking Active Self, Pharmacy Records  albuterol  (PROVENTIL ) (2.5 MG/3ML) 0.083% nebulizer solution [id number] Yes Take 3 mLs (2.5 mg total) by nebulization every 4 (four) hours as needed for wheezing or shortness of breath. Vann, Jessica U, DO Taking Active   albuterol  (VENTOLIN  HFA) 108 (90 Base) MCG/ACT inhaler [id number]  Inhale 2 puffs into the lungs every 6 (six) hours as needed for wheezing or shortness of breath. Lind Repine, MD  Active Self, Pharmacy Records  arformoterol   (BROVANA ) 15 MCG/2ML NEBU [id number] Yes Take 2 mLs (15 mcg total) by nebulization 2 (two) times daily. Vann, Jessica U, DO Taking Active   aspirin  81 MG chewable tablet [id number] Yes Chew 81 mg by mouth daily. [provider] Taking Active Self, Pharmacy Records  budesonide  (PULMICORT ) 0.25 MG/2ML nebulizer solution [id number] Yes Take 2 mLs (0.25 mg total) by nebulization 2 (two) times daily. Vann, Jessica U, DO Taking Active   carboxymethylcellulose (REFRESH PLUS) 0.5 % SOLN [id number] Yes Place 2 drops into both eyes daily as needed (dry eye). [provider] Taking Active Self, Pharmacy Records  cholecalciferol  (VITAMIN D ) 1000 UNITS tablet [id number] Yes Take 1,000 Units by mouth daily.  [provider] Taking Active Self, Pharmacy Records  fluorometholone (EFLONE) 0.1 % ophthalmic suspension [id number] Yes Place 2 drops into the left eye 2 (two) times daily. [provider] Taking Active Self, Pharmacy Records  fluticasone -salmeterol (ADVAIR  HFA) 230-21 MCG/ACT inhaler [id number]  Inhale 2 puffs into the lungs 2 (two) times daily. Roetta Clarke, NP  Active Self, Pharmacy Records  guaiFENesin  (MUCINEX ) 600 MG 12 hr tablet [id number] Yes Take 1 tablet (600 mg total) by mouth 2 (two) times daily. Vann, Jessica U, DO Taking Active   HYDROcodone  bit-homatropine (HYCODAN) 5-1.5 MG/5ML syrup [id number] Yes Take 5 mLs by mouth every 6 (six) hours as needed for cough. Vann, Jessica U, DO Taking Active   hydroxychloroquine  (PLAQUENIL ) 200 MG tablet [id number] Yes Take 200 mg by mouth daily. [provider] Taking Active Self, Pharmacy Records  latanoprost  (XALATAN ) 0.005 % ophthalmic solution [id number] Yes Place 1 drop into both eyes at bedtime. [provider] Taking Active Self, Pharmacy Records  losartan  (COZAAR ) 100 MG tablet [id number]  Take 100 mg  by mouth daily. [provider]  Active Self, Pharmacy Records  pantoprazole  (PROTONIX ) 40 MG tablet  [id number] Yes Take 40 mg by mouth daily. [provider] Taking Active Self, Pharmacy Records  potassium chloride  SA (KLOR-CON  M) 20 MEQ tablet [id number] Yes Take 1 tablet (20 mEq total) by mouth 2 (two) times daily. 2 in the morning and 1 in the evening  Patient taking differently: Take 20 mEq by mouth 2 (two) times daily.   Clearnce Curia, NP Taking Active Self, Pharmacy Records  predniSONE  (DELTASONE ) 50 MG tablet [id number] Yes Take 1 tablet (50 mg total) by mouth daily with breakfast for 5 days. Vann, Jessica U, DO Taking Active   Probiotic, Lactobacillus, CAPS [id number] Yes Take 1 capsule by mouth daily. [provider] Taking Active Self, Pharmacy Records  rosuvastatin  (CRESTOR ) 20 MG tablet [id number] Yes Take 1 tablet (20 mg total) by mouth daily. Clearnce Curia, NP Taking Active Self, Pharmacy Records  spironolactone  (ALDACTONE ) 25 MG tablet [id number]  Take 0.5 tablets (12.5 mg total) by mouth daily. Clearnce Curia, NP  Expired 11/27/23 2359 Self, Pharmacy Records  torsemide  (DEMADEX ) 20 MG tablet [id number] Yes Take 3 tablets by mouth once daily Maudine Sos, MD Taking Active Self, Pharmacy Records  traZODone  (DESYREL ) 50 MG tablet [id number] Yes Take 1 tablet (50 mg total) by mouth at bedtime as needed for sleep.  Patient taking differently: Take 50 mg by mouth at bedtime.   Ivor Mars, MD Taking Active Self, Pharmacy Records  Turmeric 500 MG CAPS [id number] Yes Take 500 mg by mouth daily. [provider] Taking Active Self, Pharmacy Records  valACYclovir  (VALTREX ) 1000 MG tablet [id number] Yes Take 1,000 mg by mouth 2 (two) times daily as needed (breakouts). [provider] Taking Active Self, Pharmacy Records            Home Care and Equipment/Supplies: Were Home Health Services Ordered?: NA Any new equipment or medical supplies ordered?: Yes Name of Medical supply agency?: [employer] Were you able to get the equipment/medical  supplies?: Yes Do you have any questions related to the use of the equipment/supplies?: No  Functional Questionnaire: Do you need assistance with bathing/showering or dressing?: No Do you need assistance with meal preparation?: Yes Do you need assistance with eating?: No Do you have difficulty maintaining continence: No Do you need assistance with getting out of bed/getting out of a chair/moving?: No Do you have difficulty managing or taking your medications?: No  Follow up appointments reviewed: PCP Follow-up appointment confirmed?: Yes Date of PCP follow-up appointment?: 12/07/23 Follow-up Provider: Dr Doyle Generous [facility] Follow-up appointment confirmed?: NA Do you need transportation to your follow-up appointment?: No Do you understand care options if your condition(s) worsen?: Yes-patient verbalized understanding  SDOH Interventions Today    Flowsheet Row Most Recent Value  SDOH Interventions   Food Insecurity Interventions Intervention Not Indicated  Housing Interventions Intervention Not Indicated  Transportation Interventions Intervention Not Indicated, Patient Resources (Friends/Family)  Utilities Interventions Intervention Not Indicated       Una Ganser BSN RN [facility] Care Management Coordinator [email or web address] Direct Dial: [phone]  Fax: [phone] Website: [email or web address]

## 2023-12-04 NOTE — Transitions of Care (Post Inpatient/ED Visit) (Signed)
   12/04/2023  Name: [name] MRN: [id number] DOB: 09-03-1950  Today's TOC FU Call Status: Today's TOC FU Call Status:: Unsuccessful Call (1st Attempt) Unsuccessful Call (1st Attempt) Date: 12/04/23  Attempted to reach the patient regarding the most recent Inpatient/ED visit.  Follow Up Plan: Additional outreach attempts will be made to reach the patient to complete the Transitions of Care (Post Inpatient/ED visit) call.   Una Ganser BSN RN [facility] Health Care Management Coordinator [email or web address] Direct Dial: [phone]  Fax: [phone] Website: [email or web address]

## 2023-12-06 ENCOUNTER — Telehealth: Payer: Self-pay | Admitting: Neurology

## 2023-12-06 DIAGNOSIS — Z9181 History of falling: Secondary | ICD-10-CM | POA: Diagnosis not present

## 2023-12-06 DIAGNOSIS — M4802 Spinal stenosis, cervical region: Secondary | ICD-10-CM | POA: Diagnosis not present

## 2023-12-06 DIAGNOSIS — R7303 Prediabetes: Secondary | ICD-10-CM | POA: Diagnosis not present

## 2023-12-06 DIAGNOSIS — I10 Essential (primary) hypertension: Secondary | ICD-10-CM | POA: Diagnosis not present

## 2023-12-06 DIAGNOSIS — J4541 Moderate persistent asthma with (acute) exacerbation: Secondary | ICD-10-CM | POA: Diagnosis not present

## 2023-12-06 NOTE — Telephone Encounter (Signed)
 Patient called in, stated Dr. Gracie Lav referred her for physical therapy next door, she had an appt scheduled, but had to cancel due to being in the hospital. She stated she tried to call them to r/s but they told her they need a new referral since she was in the hospital for some reason. Can a new referral be placed for this patient? Thank you

## 2023-12-07 DIAGNOSIS — J4541 Moderate persistent asthma with (acute) exacerbation: Secondary | ICD-10-CM | POA: Diagnosis not present

## 2023-12-11 ENCOUNTER — Inpatient Hospital Stay: Admitting: Family

## 2023-12-11 ENCOUNTER — Inpatient Hospital Stay: Attending: Hematology & Oncology

## 2023-12-11 VITALS — BP 104/59 | HR 61 | Temp 97.9°F | Resp 20 | Wt 187.0 lb

## 2023-12-11 DIAGNOSIS — D509 Iron deficiency anemia, unspecified: Secondary | ICD-10-CM | POA: Insufficient documentation

## 2023-12-11 DIAGNOSIS — E854 Organ-limited amyloidosis: Secondary | ICD-10-CM | POA: Diagnosis not present

## 2023-12-11 DIAGNOSIS — C830A Small cell b-cell lymphoma, in remission: Secondary | ICD-10-CM | POA: Insufficient documentation

## 2023-12-11 DIAGNOSIS — E859 Amyloidosis, unspecified: Secondary | ICD-10-CM | POA: Insufficient documentation

## 2023-12-11 DIAGNOSIS — D508 Other iron deficiency anemias: Secondary | ICD-10-CM | POA: Diagnosis not present

## 2023-12-11 DIAGNOSIS — B3731 Acute candidiasis of vulva and vagina: Secondary | ICD-10-CM

## 2023-12-11 DIAGNOSIS — C8300 Small cell B-cell lymphoma, unspecified site: Secondary | ICD-10-CM

## 2023-12-11 DIAGNOSIS — M1990 Unspecified osteoarthritis, unspecified site: Secondary | ICD-10-CM

## 2023-12-11 DIAGNOSIS — Z79899 Other long term (current) drug therapy: Secondary | ICD-10-CM | POA: Insufficient documentation

## 2023-12-11 LAB — CMP (CANCER CENTER ONLY)
ALT: 18 U/L (ref 0–44)
AST: 23 U/L (ref 15–41)
Albumin: 3.9 g/dL (ref 3.5–5.0)
Alkaline Phosphatase: 83 U/L (ref 38–126)
Anion gap: 8 (ref 5–15)
BUN: 19 mg/dL (ref 8–23)
CO2: 29 mmol/L (ref 22–32)
Calcium: 8.8 mg/dL — ABNORMAL LOW (ref 8.9–10.3)
Chloride: 105 mmol/L (ref 98–111)
Creatinine: 1.18 mg/dL — ABNORMAL HIGH (ref 0.44–1.00)
GFR, Estimated: 48 mL/min — ABNORMAL LOW (ref >60)
Glucose, Bld: 114 mg/dL — ABNORMAL HIGH (ref 70–99)
Potassium: 4.3 mmol/L (ref 3.5–5.1)
Sodium: 142 mmol/L (ref 135–145)
Total Bilirubin: 0.9 mg/dL (ref 0.0–1.2)
Total Protein: 6.4 g/dL — ABNORMAL LOW (ref 6.5–8.1)

## 2023-12-11 LAB — CBC WITH DIFFERENTIAL (CANCER CENTER ONLY)
Abs Immature Granulocytes: 0.04 10*3/uL (ref 0.00–0.07)
Basophils Absolute: 0 10*3/uL (ref 0.0–0.1)
Basophils Relative: 0 %
Eosinophils Absolute: 0.1 10*3/uL (ref 0.0–0.5)
Eosinophils Relative: 2 %
HCT: 37.1 % (ref 36.0–46.0)
Hemoglobin: 12 g/dL (ref 12.0–15.0)
Immature Granulocytes: 1 %
Lymphocytes Relative: 24 %
Lymphs Abs: 1.9 10*3/uL (ref 0.7–4.0)
MCH: 31.7 pg (ref 26.0–34.0)
MCHC: 32.3 g/dL (ref 30.0–36.0)
MCV: 97.9 fL (ref 80.0–100.0)
Monocytes Absolute: 0.4 10*3/uL (ref 0.1–1.0)
Monocytes Relative: 5 %
Neutro Abs: 5.5 10*3/uL (ref 1.7–7.7)
Neutrophils Relative %: 68 %
Platelet Count: 122 10*3/uL — ABNORMAL LOW (ref 150–400)
RBC: 3.79 MIL/uL — ABNORMAL LOW (ref 3.87–5.11)
RDW: 13.5 % (ref 11.5–15.5)
WBC Count: 8 10*3/uL (ref 4.0–10.5)
nRBC: 0 % (ref 0.0–0.2)

## 2023-12-11 LAB — VITAMIN B12: Vitamin B-12: 922 pg/mL — ABNORMAL HIGH (ref 180–914)

## 2023-12-11 LAB — IRON AND IRON BINDING CAPACITY (CC-WL,HP ONLY)
Iron: 63 ug/dL (ref 28–170)
Saturation Ratios: 29 % (ref 10.4–31.8)
TIBC: 220 ug/dL — ABNORMAL LOW (ref 250–450)
UIBC: 157 ug/dL (ref 148–442)

## 2023-12-11 LAB — RETICULOCYTES
Immature Retic Fract: 9.5 % (ref 2.3–15.9)
RBC.: 3.76 MIL/uL — ABNORMAL LOW (ref 3.87–5.11)
Retic Count, Absolute: 82.3 10*3/uL (ref 19.0–186.0)
Retic Ct Pct: 2.2 % (ref 0.4–3.1)

## 2023-12-11 LAB — TSH: TSH: 2.17 u[IU]/mL (ref 0.350–4.500)

## 2023-12-11 LAB — FERRITIN: Ferritin: 724 ng/mL — ABNORMAL HIGH (ref 11–307)

## 2023-12-11 MED ORDER — FLUCONAZOLE 150 MG PO TABS: 150.0000 mg | ORAL_TABLET | Freq: Every day | ORAL | 0 refills | Status: DC

## 2023-12-11 NOTE — Progress Notes (Signed)
 Hematology and Oncology Follow Up Visit  [name] [id number] 04/28/1951 73 y.o. 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 She is recuperating from her recent hospitalization for exacerbation of asthma after being exposed to someone wearing heavy perfume. She was treated with antibiotics in the hospital and has developed vaginal candidiasis.  No fever, chills, n/v, cough, dizziness, SOB, chest pain, palpitations, abdominal pain or changes in bowel or bladder habits at this time.  No swelling in her extremities.  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  No falls or syncope reported.  She ambulates with a cane for added support.  Appetite is slowly improving and she is doing her best to stay well hydrated. Her weight is stable at 187 lbs.  No blood loss, abnormal bruising or petechiae noted.   ECOG Performance Status: 1 - Symptomatic but completely ambulatory  Medications:  Allergies as of 12/11/2023       Reactions   Iodinated Contrast Media Other (See Comments)   Kidney failure.    Erythromycin  Base Itching   Red streaks on arms   Other Cough   Smells of perfume, body wash, lotion, gas, and paint.  Wheezing    Ciprofloxacin Itching, Rash   Oxycodone Other (See Comments)   Hallucination.        Medication List        Accurate as of December 11, 2023 10:50 AM. If you have any questions, ask your nurse or doctor.          PAUSE taking these medications    albuterol  108 (90 Base) MCG/ACT inhaler Wait to take this until your doctor or other care provider tells you to start again. Commonly known as: VENTOLIN  HFA Inhale 2 puffs into the lungs every 6 (six) hours as needed for wheezing or shortness of breath. You also  have another medication with the same name that you may need to continue taking.   fluticasone -salmeterol 230-21 MCG/ACT inhaler Wait to take this until your doctor or other care provider tells you to start again. Commonly known as: Advair  HFA Inhale 2 puffs into the lungs 2 (two) times daily.       TAKE these medications    acetaminophen  500 MG tablet Commonly known as: TYLENOL  Take 1,000 mg by mouth every 6 (six) hours as needed for mild pain or headache.   albuterol  (2.5 MG/3ML) 0.083% nebulizer solution Commonly known as: PROVENTIL  Take 3 mLs (2.5 mg total) by nebulization every 4 (four) hours as needed for wheezing or shortness of breath. What changed: Another medication with the same name was paused. Ask your nurse or doctor if you should take this medication.   arformoterol  15 MCG/2ML Nebu Commonly known as: BROVANA  Take 2 mLs (15 mcg total) by nebulization 2 (two) times daily.   aspirin  81 MG chewable tablet Chew 81 mg by mouth daily.   budesonide  0.25 MG/2ML nebulizer solution Commonly known as: PULMICORT  Take 2 mLs (0.25 mg total) by nebulization 2 (two) times daily.   carboxymethylcellulose 0.5 % Soln Commonly known as: REFRESH PLUS Place 2 drops into both eyes daily as needed (dry eye).   cholecalciferol  1000 units tablet Commonly known as: VITAMIN D  Take 1,000 Units by mouth daily.  fluorometholone 0.1 % ophthalmic suspension Commonly known as: EFLONE Place 2 drops into the left eye 2 (two) times daily.   guaiFENesin  600 MG 12 hr tablet Commonly known as: MUCINEX  Take 1 tablet (600 mg total) by mouth 2 (two) times daily.   HYDROcodone  bit-homatropine 5-1.5 MG/5ML syrup Commonly known as: HYCODAN Take 5 mLs by mouth every 6 (six) hours as needed for cough.   hydroxychloroquine  200 MG tablet Commonly known as: PLAQUENIL  Take 200 mg by mouth daily.   latanoprost  0.005 % ophthalmic solution Commonly known as: XALATAN  Place 1 drop into both eyes at  bedtime.   losartan  100 MG tablet Commonly known as: COZAAR  Take 100 mg by mouth daily.   pantoprazole  40 MG tablet Commonly known as: PROTONIX  Take 40 mg by mouth daily.   potassium chloride  SA 20 MEQ tablet Commonly known as: KLOR-CON  M Take 1 tablet (20 mEq total) by mouth 2 (two) times daily. 2 in the morning and 1 in the evening What changed: additional instructions   Probiotic (Lactobacillus) Caps Take 1 capsule by mouth daily.   rosuvastatin  20 MG tablet Commonly known as: CRESTOR  Take 1 tablet (20 mg total) by mouth daily.   spironolactone  25 MG tablet Commonly known as: ALDACTONE  Take 0.5 tablets (12.5 mg total) by mouth daily.   torsemide  20 MG tablet Commonly known as: DEMADEX  Take 3 tablets by mouth once daily   traZODone  50 MG tablet Commonly known as: DESYREL  Take 1 tablet (50 mg total) by mouth at bedtime as needed for sleep. What changed: when to take this   Turmeric 500 MG Caps Take 500 mg by mouth daily.   valACYclovir  1000 MG tablet Commonly known as: VALTREX  Take 1,000 mg by mouth 2 (two) times daily as needed (breakouts).        Allergies:  Allergies  Allergen Reactions   Iodinated Contrast Media Other (See Comments)    Kidney failure.    Erythromycin  Base Itching    Red streaks on arms   Other Cough    Smells of perfume, body wash, lotion, gas, and paint.  Wheezing    Ciprofloxacin Itching and Rash   Oxycodone Other (See Comments)    Hallucination.    Past Medical History, Surgical history, Social history, and Family History were reviewed and updated.  Review of Systems: All other 10 point review of systems is negative.   Physical Exam:  vitals were not taken for this visit.   Wt Readings from Last 3 Encounters:  12/01/23 190 lb 11.2 oz (86.5 kg)  11/15/23 194 lb 14.2 oz (88.4 kg)  10/09/23 194 lb 12.8 oz (88.4 kg)    Ocular: Sclerae unicteric, pupils equal, round and reactive to light Ear-nose-throat: Oropharynx clear,  dentition fair Lymphatic: No cervical or supraclavicular adenopathy Lungs no rales or rhonchi, good excursion bilaterally Heart regular rate and rhythm, no murmur appreciated Abd soft, nontender, positive bowel sounds MSK no focal spinal tenderness, no joint edema Neuro: non-focal, well-oriented, appropriate affect Breasts: Deferred   Lab Results  Component Value Date   WBC 10.9 (H) 11/29/2023   HGB 11.5 (L) 11/29/2023   HCT 36.6 11/29/2023   MCV 101.9 (H) 11/29/2023   PLT 189 11/29/2023   Lab Results  Component Value Date   FERRITIN 88 08/03/2023   IRON 48 08/03/2023   TIBC 312 08/03/2023   UIBC 264 08/03/2023   IRONPCTSAT 15 08/03/2023   Lab Results  Component Value Date   RETICCTPCT 2.2 12/11/2023   RBC 3.76 (  L) 12/11/2023   RETICCTABS 70.0 03/09/2011   Lab Results  Component Value Date   KPAFRELGTCHN 17.5 10/02/2023   LAMBDASER 18.3 10/02/2023   KAPLAMBRATIO 0.96 10/02/2023   Lab Results  Component Value Date   IGGSERUM 1,028 10/02/2023   IGA 44 (L) 10/02/2023   IGMSERUM <5 (L) 10/02/2023   Lab Results  Component Value Date   TOTALPROTELP 6.6 10/02/2023   ALBUMINELP 3.5 10/02/2023   A1GS 0.3 10/02/2023   A2GS 1.1 (H) 10/02/2023   BETS 0.8 10/02/2023   BETA2SER 0.3 05/18/2015   GAMS 0.9 10/02/2023   MSPIKE 0.4 (H) 10/02/2023   SPEI * 05/18/2015     Chemistry      Component Value Date/Time   NA 139 11/29/2023 0950   NA 144 04/25/2023 1052   NA 142 05/18/2015 1257   K 4.0 11/29/2023 0950   K 3.6 05/18/2015 1257   CL 102 11/29/2023 0950   CL 104 01/15/2013 0844   CO2 26 11/29/2023 0950   CO2 26 05/18/2015 1257   BUN 33 (H) 11/29/2023 0950   BUN 12 04/25/2023 1052   BUN 12.6 05/18/2015 1257   CREATININE 1.08 (H) 11/29/2023 0950   CREATININE 1.15 (H) 10/02/2023 1027   CREATININE 0.8 05/18/2015 1257      Component Value Date/Time   CALCIUM  8.9 11/29/2023 0950   CALCIUM  9.1 05/18/2015 1257   ALKPHOS 117 10/02/2023 1027   ALKPHOS 122 05/18/2015  1257   AST 18 10/02/2023 1027   AST 17 05/18/2015 1257   ALT 12 10/02/2023 1027   ALT 13 05/18/2015 1257   BILITOT 0.7 10/02/2023 1027   BILITOT 0.72 05/18/2015 1257       Impression and Plan: [name] is a very pleasant 73 yo African American female with 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 She has been in remission since then.  Her counts today on CBC with diff are stable.  Iron, B 12 and TSH studies are pending. We will replace if needed.  Diflucan 150 mg Po once sent to her pharmacy for yeast infection.  Follow-up in 4 months.   Kennard Pea, NP 6/9/202510:50 AM

## 2023-12-12 ENCOUNTER — Other Ambulatory Visit

## 2023-12-12 ENCOUNTER — Ambulatory Visit
Admission: RE | Admit: 2023-12-12 | Discharge: 2023-12-12 | Discharge status: Home / Self Care | Source: Ambulatory Visit | Attending: Neurology | Admitting: Neurology

## 2023-12-12 DIAGNOSIS — R269 Unspecified abnormalities of gait and mobility: Secondary | ICD-10-CM

## 2023-12-12 DIAGNOSIS — R200 Anesthesia of skin: Secondary | ICD-10-CM

## 2023-12-12 DIAGNOSIS — R2689 Other abnormalities of gait and mobility: Secondary | ICD-10-CM

## 2023-12-12 DIAGNOSIS — M51369 Other intervertebral disc degeneration, lumbar region without mention of lumbar back pain or lower extremity pain: Secondary | ICD-10-CM

## 2023-12-12 DIAGNOSIS — M47812 Spondylosis without myelopathy or radiculopathy, cervical region: Secondary | ICD-10-CM | POA: Diagnosis not present

## 2023-12-12 DIAGNOSIS — M4802 Spinal stenosis, cervical region: Secondary | ICD-10-CM | POA: Diagnosis not present

## 2023-12-12 DIAGNOSIS — Z981 Arthrodesis status: Secondary | ICD-10-CM | POA: Diagnosis not present

## 2023-12-12 DIAGNOSIS — Z9181 History of falling: Secondary | ICD-10-CM

## 2023-12-14 ENCOUNTER — Other Ambulatory Visit: Payer: Self-pay | Admitting: Cardiovascular Disease

## 2023-12-18 ENCOUNTER — Telehealth: Payer: Self-pay | Admitting: Neurology

## 2023-12-18 DIAGNOSIS — G959 Disease of spinal cord, unspecified: Secondary | ICD-10-CM | POA: Insufficient documentation

## 2023-12-18 DIAGNOSIS — R269 Unspecified abnormalities of gait and mobility: Secondary | ICD-10-CM

## 2023-12-18 NOTE — Telephone Encounter (Signed)
 Please call patient, MRI of cervical spine showed multilevel degenerative changes most noticeable at C4-5, with evidence of moderate canal stenosis, variable degree of foraminal narrowing  Above findings could potentially contributing her gait abnormality I would like to refer her to neurosurgeon to evaluation  If she has a lot of question may put up a virtual visit with me MRI cervical spine without contrast demonstrating: - At C4-5 disc bulging and facet hypertrophy with moderate spinal stenosis and severe bilateral foraminal stenosis. - At C6-7 disc bulging with mild spinal stenosis and no foraminal stenosis. - Spinal cord is notable for subtle increased T2 hyperintense signal at C3-4 and C6-7 on sagittal views, not well-visualized on axial views. Could represent myelomalacia vs artifact.  - ACDF C5-C6.

## 2023-12-19 ENCOUNTER — Telehealth: Payer: Self-pay | Admitting: Neurology

## 2023-12-19 DIAGNOSIS — R7303 Prediabetes: Secondary | ICD-10-CM | POA: Diagnosis not present

## 2023-12-19 DIAGNOSIS — I5032 Chronic diastolic (congestive) heart failure: Secondary | ICD-10-CM | POA: Diagnosis not present

## 2023-12-19 DIAGNOSIS — I130 Hypertensive heart and chronic kidney disease with heart failure and stage 1 through stage 4 chronic kidney disease, or unspecified chronic kidney disease: Secondary | ICD-10-CM | POA: Diagnosis not present

## 2023-12-19 DIAGNOSIS — M064 Inflammatory polyarthropathy: Secondary | ICD-10-CM | POA: Diagnosis not present

## 2023-12-19 DIAGNOSIS — I10 Essential (primary) hypertension: Secondary | ICD-10-CM | POA: Diagnosis not present

## 2023-12-19 DIAGNOSIS — N182 Chronic kidney disease, stage 2 (mild): Secondary | ICD-10-CM | POA: Diagnosis not present

## 2023-12-19 DIAGNOSIS — E782 Mixed hyperlipidemia: Secondary | ICD-10-CM | POA: Diagnosis not present

## 2023-12-19 NOTE — Telephone Encounter (Signed)
 Called and spoke to pt and relayed information and she is agreeable to neurosurgery

## 2023-12-19 NOTE — Addendum Note (Signed)
 Addended by: Pailynn Vahey on: 12/19/2023 11:08 AM   Modules accepted: Orders

## 2023-12-19 NOTE — Telephone Encounter (Signed)
 Referral for neurosurgery fax to [facility]. Phone: [phone], Fax: [phone]

## 2023-12-19 NOTE — Telephone Encounter (Signed)
 Orders Placed This Encounter  Procedures   Ambulatory referral to Neurosurgery   Order placed for cervical myelopathy, slow worsening gait abnormality

## 2023-12-20 NOTE — Telephone Encounter (Signed)
 Please sent her to [facility] on [address]

## 2023-12-20 NOTE — Telephone Encounter (Signed)
 Good afternoon Dr.Yan, I just spoke to the patient again. She does not want to come to [facility] it's too far for her to drive. She would like to see someone in [place].

## 2023-12-20 NOTE — Telephone Encounter (Signed)
 Left message for patient to call the office

## 2023-12-21 ENCOUNTER — Encounter (HOSPITAL_BASED_OUTPATIENT_CLINIC_OR_DEPARTMENT_OTHER): Payer: Self-pay | Admitting: Adult Health

## 2023-12-21 ENCOUNTER — Ambulatory Visit (HOSPITAL_BASED_OUTPATIENT_CLINIC_OR_DEPARTMENT_OTHER): Admitting: Adult Health

## 2023-12-21 VITALS — BP 108/54 | HR 99 | Ht 66.0 in | Wt 188.0 lb

## 2023-12-21 DIAGNOSIS — J4541 Moderate persistent asthma with (acute) exacerbation: Secondary | ICD-10-CM | POA: Diagnosis not present

## 2023-12-21 DIAGNOSIS — J3089 Other allergic rhinitis: Secondary | ICD-10-CM | POA: Diagnosis not present

## 2023-12-21 MED ORDER — ALBUTEROL SULFATE HFA 108 (90 BASE) MCG/ACT IN AERS: 2.0000 | INHALATION_SPRAY | Freq: Four times a day (QID) | RESPIRATORY_TRACT | 6 refills | Status: AC | PRN

## 2023-12-21 MED ORDER — FLUTICASONE-SALMETEROL 230-21 MCG/ACT IN AERO: 2.0000 | INHALATION_SPRAY | Freq: Two times a day (BID) | RESPIRATORY_TRACT | 5 refills | Status: DC

## 2023-12-21 NOTE — Progress Notes (Signed)
 @Patient  ID: [name], female    DOB: 12-10-1950, 73 y.o.   MRN: [id number]  Chief Complaint  Patient presents with   Hospitalization Follow-up    Asthma exacerbation    Referring provider: Virgle Grime, MD  HPI: 73 year old female former smoker followed for asthma Medical history significant for congestive heart failure, lymphoma and lupus (on Plaquenil ).  Previous bone marrow biopsy negative for amyloid  TEST/EVENTS :  11/2016 CT abdomen: Shows mild chronic bibasilar pulmonary scarring, likely postinflammatory 08/2021 PFTs: FEV1 61%, FVC 57%, ratio 83%, 17% improvement in FVC with albuterol . 01/20/2022 CXR 2 view: lungs are clear.   12/21/2023 Post hospital follow up  Discussed the use of AI scribe software for clinical note transcription with the patient, who gave verbal consent to proceed.  History of Present Illness    HAZELL [name] is a 73 year old female with asthma who presents for a post-hospital follow-up after an asthma flare-up.  Last month, she was hospitalized due to an asthma flare-up triggered by exposure to strong odors, including a heavy perfume scent in a store. She experienced worsening symptoms of cough, wheezing, and shortness of breath, which led to her hospitalization. During her hospital stay, testing ruled out viral infections such as flu and COVID-19, all of which were negative. A chest x-ray was performed to check for pneumonia, which was also negative. She was treated with nebulizer medications and a prednisone  taper, and subsequently discharged home.    Following her discharge, her asthma management was adjusted from Advair  to nebulized Brovana  and budesonide . She finds the nebulizer effective but less convenient than the inhaler form and prefers returning to Advair  due to its ease of use. She continues to use an albuterol  inhaler and nebulizer as needed for acute symptoms.  Since discharge she is doing better.  In addition to asthma, she has  a history of lupus, for which she is currently taking Plaquenil .  Says overall she is doing well with no recent flares.  No significant coughing but ongoing hoarseness and occasional sniffing due to allergies. She uses over-the-counter medications for allergy relief, although she is not currently on a specific antihistamine regimen.         Allergies  Allergen Reactions   Iodinated Contrast Media Other (See Comments)    Kidney failure.    Erythromycin  Base Itching    Red streaks on arms   Other Cough    Smells of perfume, body wash, lotion, gas, and paint.  Wheezing    Ciprofloxacin Itching and Rash   Oxycodone Other (See Comments)    Hallucination.    Immunization History  Administered Date(s) Administered   Fluad Quad(high Dose 65+) 04/12/2022   Influenza, High Dose Seasonal PF 06/11/2014, 04/14/2015, 05/16/2016   Influenza, Quadrivalent, Recombinant, Inj, Pf 03/21/2017, 04/10/2018, 02/22/2019, 04/10/2020, 05/04/2021   Influenza-Unspecified 06/08/2011, 04/05/2012, 05/18/2012, 05/30/2012, 05/24/2013   PFIZER(Purple Top)SARS-COV-2 Vaccination 08/16/2019, 09/10/2019, 04/03/2020   PNEUMOCOCCAL CONJUGATE-20 11/09/2021   Pneumococcal Conjugate-13 09/12/2017   Pneumococcal Polysaccharide-23 05/27/2002, 10/17/2012, 08/17/2018, 10/11/2019   Tdap 04/05/2012   Zoster Recombinant(Shingrix) 11/02/2021, 03/22/2022    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   Near syncope 08/30/2019   PONV (postoperative nausea and  vomiting)    Pure hypercholesterolemia 05/31/2022   Stroke (HCC)    lt side weakness    Tobacco History: Social History   Tobacco Use  Smoking Status Former   Current packs/day: 0.00    Average packs/day: 1 pack/day for 10.0 years (10.0 ttl pk-yrs)   Types: Cigarettes   Start date: 06/17/1984   Quit date: 06/17/1994   Years since quitting: 29.5   Passive exposure: Past  Smokeless Tobacco Never   Counseling given: Not Answered   Outpatient Medications Prior to Visit  Medication Sig Dispense Refill   acetaminophen  (TYLENOL ) 500 MG tablet Take 1,000 mg by mouth every 6 (six) hours as needed for mild pain or headache.     albuterol  (PROVENTIL ) (2.5 MG/3ML) 0.083% nebulizer solution Take 3 mLs (2.5 mg total) by nebulization every 4 (four) hours as needed for wheezing or shortness of breath. 75 mL 0   aspirin  81 MG chewable tablet Chew 81 mg by mouth daily.     carboxymethylcellulose (REFRESH PLUS) 0.5 % SOLN Place 2 drops into both eyes daily as needed (dry eye).     cholecalciferol  (VITAMIN D ) 1000 UNITS tablet Take 1,000 Units by mouth daily.      fluconazole  (DIFLUCAN ) 150 MG tablet Take 1 tablet (150 mg total) by mouth daily. 1 tablet 0   fluorometholone (EFLONE) 0.1 % ophthalmic suspension Place 2 drops into the left eye 2 (two) times daily.     guaiFENesin  (MUCINEX ) 600 MG 12 hr tablet Take 1 tablet (600 mg total) by mouth 2 (two) times daily.     hydroxychloroquine  (PLAQUENIL ) 200 MG tablet Take 200 mg by mouth daily.     latanoprost  (XALATAN ) 0.005 % ophthalmic solution Place 1 drop into both eyes at bedtime.     losartan  (COZAAR ) 100 MG tablet Take 100 mg by mouth daily.     pantoprazole  (PROTONIX ) 40 MG tablet Take 40 mg by mouth daily.     potassium chloride  SA (KLOR-CON  M) 20 MEQ tablet Take 1 tablet (20 mEq total) by mouth 2 (two) times daily. 2 in the morning and 1 in the evening 60 tablet 11   Probiotic, Lactobacillus, CAPS Take 1 capsule by mouth daily.     rosuvastatin  (CRESTOR ) 20 MG tablet TAKE 1 TABLET BY MOUTH DAILY 90 tablet 2   torsemide  (DEMADEX ) 20 MG tablet Take 3 tablets by mouth once daily 90 tablet 3   traZODone  (DESYREL ) 50 MG tablet Take 1  tablet (50 mg total) by mouth at bedtime as needed for sleep. 30 tablet 2   Turmeric 500 MG CAPS Take 500 mg by mouth daily.     valACYclovir  (VALTREX ) 1000 MG tablet Take 1,000 mg by mouth 2 (two) times daily as needed (breakouts).     albuterol  (VENTOLIN  HFA) 108 (90 Base) MCG/ACT inhaler Inhale 2 puffs into the lungs every 6 (six) hours as needed for wheezing or shortness of breath. 8 g 6   arformoterol  (BROVANA ) 15 MCG/2ML NEBU Take 2 mLs (15 mcg total) by nebulization 2 (two) times daily. 120 mL 0   budesonide  (PULMICORT ) 0.25 MG/2ML nebulizer solution Take 2 mLs (0.25 mg total) by nebulization 2 (two) times daily. 60 mL 0   HYDROcodone  bit-homatropine (HYCODAN) 5-1.5 MG/5ML syrup Take 5 mLs by mouth every 6 (six) hours as needed for cough. (Patient not taking: Reported on 12/21/2023) 120 mL 0   spironolactone  (ALDACTONE ) 25 MG tablet Take 0.5 tablets (12.5 mg total) by mouth daily. 30 tablet 11  fluticasone -salmeterol (ADVAIR  HFA) 230-21 MCG/ACT inhaler Inhale 2 puffs into the lungs 2 (two) times daily. (Patient not taking: Reported on 12/21/2023) 1 each 5   No facility-administered medications prior to visit.     Review of Systems:   Constitutional:   No  weight loss, night sweats,  Fevers, chills, fatigue, or  lassitude.  HEENT:   No headaches,  Difficulty swallowing,  Tooth/dental problems, or  Sore throat,                No sneezing, itching, ear ache, +nasal congestion, post nasal drip,   CV:  No chest pain,  Orthopnea, PND, swelling in lower extremities, anasarca, dizziness, palpitations, syncope.   GI  No heartburn, indigestion, abdominal pain, nausea, vomiting, diarrhea, change in bowel habits, loss of appetite, bloody stools.   Resp:   No chest wall deformity  Skin: no rash or lesions.  GU: no dysuria, change in color of urine, no urgency or frequency.  No flank pain, no hematuria   MS:  No joint pain or swelling.  No decreased range of motion.  No back  pain.    Physical Exam  BP (!) 108/54   Pulse 99   Ht 5' 6 (1.676 m)   Wt 188 lb (85.3 kg)   SpO2 99%   BMI 30.34 kg/m   GEN: A/Ox3; pleasant , NAD, well nourished    HEENT:  [place]/AT,  NOSE-clear, THROAT-clear, no lesions, no postnasal drip or exudate noted.   NECK:  Supple w/ fair ROM; no JVD; normal carotid impulses w/o bruits; no thyromegaly or nodules palpated; no lymphadenopathy.    RESP  Clear  P & A; w/o, wheezes/ rales/ or rhonchi. no accessory muscle use, no dullness to percussion  CARD:  RRR, no m/r/g, no peripheral edema, pulses intact, no cyanosis or clubbing.  GI:   Soft & nt; nml bowel sounds; no organomegaly or masses detected.   Musco: Warm bil, no deformities or joint swelling noted.   Neuro: alert, no focal deficits noted.    Skin: Warm, no lesions or rashes    Lab Results:  CBC       ProBNP   Administration History     None          Latest Ref Rng & Units 09/03/2021    9:55 AM  PFT Results  FVC-Pre L 1.47   FVC-Predicted Pre % 57   FVC-Post L 1.65   FVC-Predicted Post % 65   Pre FEV1/FVC % % 83   Post FEV1/FCV % % 86   FEV1-Pre L 1.22   FEV1-Predicted Pre % 61   FEV1-Post L 1.42     Lab Results  Component Value Date   NITRICOXIDE 31 02/10/2022        Assessment & Plan:   No problem-specific Assessment & Plan notes found for this encounter.  Assessment and Plan    Asthma  - A recent exacerbation triggered by strong odors led to hospitalization, with symptoms of cough, wheezing, and dyspnea. Viral panel and chest x-ray were negative for infections. Current treatment includes Brovana  and budesonide  nebulizers, but she prefers the convenience of the Advair  inhaler. Asthma is generally well-controlled, and this episode is considered a temporary flare-up. Discussed potential escalation to Trelegy if future exacerbations occur. Advised on proper inhaler technique to prevent hoarseness. Discontinue Brovana  and budesonide   nebulizers. Prescribe Advair  inhaler, two puffs twice daily, and ensure proper rinsing after use. Prescribe albuterol  inhaler for as-needed use. Recommend over-the-counter Delsym   or Mucinex  DM for cough if needed. Advise against using Benadryl due to side effects and suggest alternatives like Claritin, Zyrtec, or Allegra for allergies.  AR : Claritin As needed    Systemic lupus erythematosus   Lupus is well-managed with ongoing Plaquenil  treatment.   Plan  Patient Instructions  Stop Brovana  and Budesonide   Restart Advair  2 puffs Twice daily, rinse after use.  Delsym  2 tsp Twice daily  As needed  cough  May try Claritin 10mg  daily As needed  drainage.  Activity as tolerated  Follow up with Dr. Villa Greaser  in 3 months and As needed           I spent  31  minutes dedicated to the care of this patient on the date of this encounter to include pre-visit review of records, face-to-face time with the patient discussing conditions above, post visit ordering of testing, clinical documentation with the electronic health record, making appropriate referrals as documented, and communicating necessary findings to members of the patients care team.    Roena Clark, NP 12/21/2023

## 2023-12-21 NOTE — Patient Instructions (Addendum)
 Stop Brovana  and Budesonide   Restart Advair  2 puffs Twice daily, rinse after use.  Delsym  2 tsp Twice daily  As needed  cough  May try Claritin 10mg  daily As needed  drainage.  Activity as tolerated  Follow up with Dr. Villa Greaser  in 3 months and As needed

## 2023-12-26 DIAGNOSIS — R7303 Prediabetes: Secondary | ICD-10-CM | POA: Diagnosis not present

## 2023-12-26 DIAGNOSIS — M3501 Sicca syndrome with keratoconjunctivitis: Secondary | ICD-10-CM | POA: Diagnosis not present

## 2023-12-26 DIAGNOSIS — Z0000 Encounter for general adult medical examination without abnormal findings: Secondary | ICD-10-CM | POA: Diagnosis not present

## 2023-12-26 DIAGNOSIS — C8300 Small cell B-cell lymphoma, unspecified site: Secondary | ICD-10-CM | POA: Diagnosis not present

## 2023-12-26 DIAGNOSIS — M329 Systemic lupus erythematosus, unspecified: Secondary | ICD-10-CM | POA: Diagnosis not present

## 2023-12-29 ENCOUNTER — Other Ambulatory Visit: Payer: Self-pay | Admitting: Hematology & Oncology

## 2023-12-29 DIAGNOSIS — Z79899 Other long term (current) drug therapy: Secondary | ICD-10-CM | POA: Diagnosis not present

## 2023-12-29 DIAGNOSIS — H16229 Keratoconjunctivitis sicca, not specified as Sjogren's, unspecified eye: Secondary | ICD-10-CM | POA: Diagnosis not present

## 2023-12-29 DIAGNOSIS — E854 Organ-limited amyloidosis: Secondary | ICD-10-CM | POA: Diagnosis not present

## 2023-12-29 DIAGNOSIS — H40003 Preglaucoma, unspecified, bilateral: Secondary | ICD-10-CM | POA: Diagnosis not present

## 2023-12-29 DIAGNOSIS — H1189 Other specified disorders of conjunctiva: Secondary | ICD-10-CM | POA: Diagnosis not present

## 2023-12-29 DIAGNOSIS — Z9841 Cataract extraction status, right eye: Secondary | ICD-10-CM | POA: Diagnosis not present

## 2023-12-29 DIAGNOSIS — Z961 Presence of intraocular lens: Secondary | ICD-10-CM | POA: Diagnosis not present

## 2023-12-29 DIAGNOSIS — H119 Unspecified disorder of conjunctiva: Secondary | ICD-10-CM | POA: Diagnosis not present

## 2023-12-29 DIAGNOSIS — H1132 Conjunctival hemorrhage, left eye: Secondary | ICD-10-CM | POA: Diagnosis not present

## 2023-12-29 DIAGNOSIS — Z9842 Cataract extraction status, left eye: Secondary | ICD-10-CM | POA: Diagnosis not present

## 2023-12-29 DIAGNOSIS — H16121 Filamentary keratitis, right eye: Secondary | ICD-10-CM | POA: Diagnosis not present

## 2024-01-01 ENCOUNTER — Ambulatory Visit (HOSPITAL_BASED_OUTPATIENT_CLINIC_OR_DEPARTMENT_OTHER): Payer: Self-pay | Admitting: Pulmonary Disease

## 2024-01-01 ENCOUNTER — Encounter (HOSPITAL_BASED_OUTPATIENT_CLINIC_OR_DEPARTMENT_OTHER): Payer: Self-pay | Admitting: Pulmonary Disease

## 2024-01-01 ENCOUNTER — Ambulatory Visit (HOSPITAL_BASED_OUTPATIENT_CLINIC_OR_DEPARTMENT_OTHER): Admitting: Pulmonary Disease

## 2024-01-01 VITALS — BP 92/42 | HR 82 | Temp 97.8°F | Ht 66.0 in | Wt 187.0 lb

## 2024-01-01 DIAGNOSIS — J4541 Moderate persistent asthma with (acute) exacerbation: Secondary | ICD-10-CM | POA: Diagnosis not present

## 2024-01-01 MED ORDER — METHYLPREDNISOLONE ACETATE 80 MG/ML IJ SUSP
80.0000 mg | Freq: Once | INTRAMUSCULAR | Status: AC
  Administered 2024-01-01: 80 mg via INTRAMUSCULAR

## 2024-01-01 MED ORDER — DOXYCYCLINE HYCLATE 100 MG PO TABS: 100.0000 mg | ORAL_TABLET | Freq: Two times a day (BID) | ORAL | 0 refills | Status: DC

## 2024-01-01 MED ORDER — PREDNISONE 10 MG PO TABS: ORAL_TABLET | ORAL | 0 refills | Status: DC

## 2024-01-01 NOTE — Progress Notes (Signed)
 Subjective:    Patient ID: [name], female    DOB: December 07, 1950, 73 y.o.   MRN: [id number]  HPI  73 yo remote smoker for FU of shortness of breath. Being treated as asthma due to  - relief with Symbicort  -reversibility on PFTs    PMH -hypertension, HFpEF Lymphoma 2008 SLE on Plaquenil  ( sayed ) , bone marrow biopsy  negative for amyloid.  Syncope, has a loop recorder   She smoked less than 10 pack years starting in her 30s and quit in 1995. She reports a diagnosis of asthma many years ago Dr. Cloretta gave her albuterol , triggers being odors, seasonal allergies.   Last month, she was hospitalized due to an asthma flare-up triggered by exposure to strong odors, including a heavy perfume scent in a store. She experienced worsening symptoms of cough, wheezing, and shortness of breath, which led to her hospitalization. During her hospital stay, testing ruled out viral infections such as flu and COVID-19, all of which were negative. A chest x-ray was performed to check for pneumonia, which was also negative. She was treated with nebulizer medications and a prednisone  taper, and subsequently discharged home.     Following her discharge, her asthma management was adjusted from Advair  to nebulized Brovana  and budesonide . Switched back to advair  due to convenience on post hosp FU visit 12/21/23 . She experienced symptom improvement after her last visit but symptoms worsened after exposure to grass being cut. Despite closed windows and wearing a mask, she developed a dry throat, progressing to a dry hacking cough and wheezing. Her sputum is mostly yellow with occasional dark green discoloration.  She is using Advair , albuterol , and Mucinex . She completed a course of cough syrup from her hospital stay.  She has increased sensitivity to smells, which she believes triggers sinus issues and exacerbates her asthma. She recalls a previous incident where strong perfume exposure triggered symptoms. No  changes in her home environment or recent illnesses in her surroundings.  Significant tests/ events reviewed   BNP 213 RAST panel negative, IgE less than 2   09/2021 Spirometry -moderate restriction with some bronchodilator response.  Ratio 83, FEV1 61%, FVC 57%, 17% improvement in FVC with albuterol    CT abdomen 11/2016 in 2015 shows mild chronic bibasilar pulmonary scarring  06/2023 Esophagram Evidence of dysmotility with incomplete clearing of the esophagus , no GERD  Review of Systems neg for any significant sore throat, dysphagia, itching, sneezing, nasal congestion or excess/ purulent secretions, fever, chills, sweats, unintended wt loss, pleuritic or exertional cp, hempoptysis, orthopnea pnd or change in chronic leg swelling. Also denies presyncope, palpitations, heartburn, abdominal pain, nausea, vomiting, diarrhea or change in bowel or urinary habits, dysuria,hematuria, rash, arthralgias, visual complaints, headache, numbness weakness or ataxia.     Objective:   Physical Exam  Gen. Pleasant, well-nourished, in no distress ENT - no thrush, no pallor/icterus,no post nasal drip Neck: No JVD, no thyromegaly, no carotid bruits Lungs: no use of accessory muscles, no dullness to percussion, BL diffuse rhonchi  Cardiovascular: Rhythm regular, heart sounds  normal, no murmurs or gallops, no peripheral edema Musculoskeletal: No deformities, no cyanosis or clubbing        Assessment & Plan:  Asthma exacerbation Acute exacerbation of asthma triggered by environmental allergens, including grass and perfume, with symptoms of dry throat, dry hacking cough, wheezing, and production of yellow to dark green sputum. Increased sensitivity to smells. Wheezing on examination. Previous hospitalization in May for asthma exacerbation. Current treatment  includes Advair , albuterol , and Mucinex . No recent changes in home environment or exposure to sick contacts. - Administer Solu-Medrol  80 mg IM once. -  Prescribe prednisone  40 mg with a taper over two weeks. - Prescribe doxy for yellow-green sputum. - Continue Advair  twice daily as maintenance therapy. - Use albuterol  inhaler as a rescue medication when active. - Use albuterol  nebulizer twice daily for the next two to three days. - Monitor symptoms and contact the office if no improvement in two to three days.  Systemic lupus erythematosus Systemic lupus erythematosus managed with Plaquenil .

## 2024-01-01 NOTE — Patient Instructions (Signed)
 VISIT SUMMARY:  Today, you were seen for an asthma exacerbation. Your symptoms worsened after exposure to grass being cut, despite taking precautions. You experienced a dry throat, dry hacking cough, wheezing, and yellow to dark green sputum. You also have increased sensitivity to smells, which can trigger your asthma. Your current medications include Advair , albuterol , and Mucinex .  YOUR PLAN:  -ASTHMA EXACERBATION: An asthma exacerbation is a worsening of asthma symptoms, often triggered by environmental factors like allergens. For your treatment, you received a Solu-Medrol  injection today. You will start a prednisone  taper over two weeks and take azithromycin for the yellow-green sputum. Continue using Advair  twice daily, use your albuterol  inhaler as needed, and use the albuterol  nebulizer twice daily for the next two to three days. Monitor your symptoms and contact our office if you do not see improvement in two to three days.

## 2024-01-01 NOTE — Telephone Encounter (Signed)
 FYI Only or Action Required?: FYI only for provider.  Patient is followed in Pulmonology for Asthma, last seen on 12/21/2023 by Parrett, Madelin RAMAN, NP. Called Nurse Triage reporting Shortness of Breath. Symptoms began 5-6 days ago. Interventions attempted: OTC medications: Mucinex , Prescription medications: Albuterol  inhaler, Advair , Rescue inhaler, and Maintenance inhaler. Symptoms are: gradually worsening.  Triage Disposition: Go to ED Now (Notify PCP)  Patient/caregiver understands and will follow disposition?: No, wishes to speak with PCP  Appointment made today 01/01/2024 at 10 AM with Dr Jude at [facility] after speaking with pulmonary staff                 Copied from CRM [id number]. Topic: Clinical - Red Word Triage >> Jan 01, 2024  8:04 AM Leila C wrote: Red Word that prompted transfer to Nurse Triage: Patient [phone] states asthma is flaring up, wheezing, shortness of breath when moving around, dizziness, coughing sometimes phlegm dark color yellowish green. Patient denies a fever nor pain. Patient wants to see Dr. Jude or NP, Parrett today, please advise. Reason for Disposition  Wheezing can be heard across the room  Answer Assessment - Initial Assessment Questions E2C2 Pulmonary Triage - Initial Assessment Questions Chief Complaint (e.g., cough, sob, wheezing, fever, chills, sweat or additional symptoms) *Go to specific symptom protocol after initial questions. Shortness of breath, cough  How long have symptoms been present? Started 6 days ago  Have you tested for COVID or Flu? Note: If not, ask patient if a home test can be taken. If so, instruct patient to call back for positive results. Yes  MEDICINES:   Have you used any OTC meds to help with symptoms? Yes If yes, ask What medications? Mucinex   Have you used your inhalers/maintenance medication? Yes If yes, What medications? Albuterol  Inhaler--Inhale 2 puffs into the lungs every 6 (six)  hours as needed for wheezing or shortness of breath. Advair --Inhale 2 puffs into the lungs 2 (two) times daily.  If inhaler, ask How many puffs and how often? Note: Review instructions on medication in the chart. Albuterol  Inhaler--Inhale 2 puffs into the lungs every 6 (six) hours as needed for wheezing or shortness of breath. Advair --Inhale 2 puffs into the lungs 2 (two) times daily.   OXYGEN: Do you wear supplemental oxygen? No If yes, How many liters are you supposed to use? N/a  Do you monitor your oxygen levels? Yes If yes, What is your reading (oxygen level) today? 98% mostly   98-99% today  What is your usual oxygen saturation reading?  (Note: Pulmonary O2 sats should be 90% or greater) -------     3. PATTERN Does the difficult breathing come and go, or has it been constant since it started?      Constant since 5-6 days ago 4. SEVERITY: How bad is your breathing? (e.g., mild, moderate, severe)    - MILD: No SOB at rest, mild SOB with walking, speaks normally in sentences, can lie down, no retractions, pulse < 100.    - MODERATE: SOB at rest, SOB with minimal exertion and prefers to sit, cannot lie down flat, speaks in phrases, mild retractions, audible wheezing, pulse 100-120.    - SEVERE: Very SOB at rest, speaks in single words, struggling to breathe, sitting hunched forward, retractions, pulse > 120      Only on exertion 5. RECURRENT SYMPTOM: Have you had difficulty breathing before? If Yes, ask: When was the last time? and What happened that time?      In hospital  24th through the 30th of May 6. CARDIAC HISTORY: Do you have any history of heart disease? (e.g., heart attack, angina, bypass surgery, angioplasty)      Heart failure 7. LUNG HISTORY: Do you have any history of lung disease?  (e.g., pulmonary embolus, asthma, emphysema)     Asthma 8. CAUSE: What do you think is causing the breathing problem?      Grass cutting that she rode by 6  days ago 9. OTHER SYMPTOMS: Do you have any other symptoms? (e.g., dizziness, runny nose, cough, chest pain, fever)     Dry hacking cough one day, productive cough with yellow mucous, greenish yellow yesterday 10. O2 SATURATION MONITOR:  Do you use an oxygen saturation monitor (pulse oximeter) at home? If Yes, ask: What is your reading (oxygen level) today? What is your usual oxygen saturation reading? (e.g., 95%)       -----    Patient was advised at last visit to use both inhalers and not the nebulizer anymore at her last pulmonary office visit. Patient was wheezing on expiration audible through this telephonic triage call with RN. Patient used both her Advair  and albuterol  inhalers while on the phone with this RN and her wheezing on expiration audibly got better. Patient was advised that the Emergency Room was recommended at this time for immediate medical attention. Patient did not want to go to the ER if at all possible. She states that she is only short of breath on exertion and she was hoping to come in to the pulmonary office for a steroid injection/evaluation. This is discussed with pulmonary staff during triage call and advised that there was an acute appt with Dr Jude today at 10 AM. Patient was booked for this appointment, her husband is there with her, and she is advised that if anything worsens at all to go to the Emergency Room. She verbalized understanding.  Protocols used: Breathing Difficulty-A-AH

## 2024-01-03 DIAGNOSIS — M4802 Spinal stenosis, cervical region: Secondary | ICD-10-CM | POA: Diagnosis not present

## 2024-01-03 DIAGNOSIS — Z6829 Body mass index (BMI) 29.0-29.9, adult: Secondary | ICD-10-CM | POA: Diagnosis not present

## 2024-01-14 ENCOUNTER — Other Ambulatory Visit (HOSPITAL_BASED_OUTPATIENT_CLINIC_OR_DEPARTMENT_OTHER): Payer: Self-pay | Admitting: Cardiovascular Disease

## 2024-01-14 DIAGNOSIS — I5032 Chronic diastolic (congestive) heart failure: Secondary | ICD-10-CM

## 2024-01-25 DIAGNOSIS — R2681 Unsteadiness on feet: Secondary | ICD-10-CM | POA: Diagnosis not present

## 2024-01-25 DIAGNOSIS — Z9181 History of falling: Secondary | ICD-10-CM | POA: Diagnosis not present

## 2024-01-26 ENCOUNTER — Other Ambulatory Visit: Payer: Self-pay | Admitting: Hematology & Oncology

## 2024-01-30 DIAGNOSIS — E782 Mixed hyperlipidemia: Secondary | ICD-10-CM | POA: Diagnosis not present

## 2024-01-30 DIAGNOSIS — N182 Chronic kidney disease, stage 2 (mild): Secondary | ICD-10-CM | POA: Diagnosis not present

## 2024-01-31 DIAGNOSIS — N182 Chronic kidney disease, stage 2 (mild): Secondary | ICD-10-CM | POA: Diagnosis not present

## 2024-01-31 DIAGNOSIS — J4540 Moderate persistent asthma, uncomplicated: Secondary | ICD-10-CM | POA: Diagnosis not present

## 2024-01-31 DIAGNOSIS — Z9181 History of falling: Secondary | ICD-10-CM | POA: Diagnosis not present

## 2024-01-31 DIAGNOSIS — I5032 Chronic diastolic (congestive) heart failure: Secondary | ICD-10-CM | POA: Diagnosis not present

## 2024-02-06 DIAGNOSIS — M329 Systemic lupus erythematosus, unspecified: Secondary | ICD-10-CM | POA: Diagnosis not present

## 2024-02-06 DIAGNOSIS — N182 Chronic kidney disease, stage 2 (mild): Secondary | ICD-10-CM | POA: Diagnosis not present

## 2024-02-06 DIAGNOSIS — M3501 Sicca syndrome with keratoconjunctivitis: Secondary | ICD-10-CM | POA: Diagnosis not present

## 2024-02-06 DIAGNOSIS — C8300 Small cell B-cell lymphoma, unspecified site: Secondary | ICD-10-CM | POA: Diagnosis not present

## 2024-02-08 DIAGNOSIS — Z9181 History of falling: Secondary | ICD-10-CM | POA: Diagnosis not present

## 2024-02-08 DIAGNOSIS — R2681 Unsteadiness on feet: Secondary | ICD-10-CM | POA: Diagnosis not present

## 2024-02-13 DIAGNOSIS — Z9181 History of falling: Secondary | ICD-10-CM | POA: Diagnosis not present

## 2024-02-13 DIAGNOSIS — R2681 Unsteadiness on feet: Secondary | ICD-10-CM | POA: Diagnosis not present

## 2024-02-15 DIAGNOSIS — Z9181 History of falling: Secondary | ICD-10-CM | POA: Diagnosis not present

## 2024-02-15 DIAGNOSIS — R2681 Unsteadiness on feet: Secondary | ICD-10-CM | POA: Diagnosis not present

## 2024-02-19 DIAGNOSIS — Z9181 History of falling: Secondary | ICD-10-CM | POA: Diagnosis not present

## 2024-02-19 DIAGNOSIS — R2681 Unsteadiness on feet: Secondary | ICD-10-CM | POA: Diagnosis not present

## 2024-02-21 DIAGNOSIS — Z9181 History of falling: Secondary | ICD-10-CM | POA: Diagnosis not present

## 2024-02-21 DIAGNOSIS — R2681 Unsteadiness on feet: Secondary | ICD-10-CM | POA: Diagnosis not present

## 2024-02-26 ENCOUNTER — Encounter (HOSPITAL_BASED_OUTPATIENT_CLINIC_OR_DEPARTMENT_OTHER): Payer: Self-pay | Admitting: Adult Health

## 2024-02-26 ENCOUNTER — Ambulatory Visit (HOSPITAL_BASED_OUTPATIENT_CLINIC_OR_DEPARTMENT_OTHER): Admitting: Adult Health

## 2024-02-26 VITALS — BP 112/52 | HR 71 | Ht 66.0 in | Wt 183.0 lb

## 2024-02-26 DIAGNOSIS — R053 Chronic cough: Secondary | ICD-10-CM

## 2024-02-26 DIAGNOSIS — J4540 Moderate persistent asthma, uncomplicated: Secondary | ICD-10-CM

## 2024-02-26 MED ORDER — MONTELUKAST SODIUM 10 MG PO TABS: 10.0000 mg | ORAL_TABLET | Freq: Every day | ORAL | 11 refills | Status: AC

## 2024-02-26 MED ORDER — TRELEGY ELLIPTA 200-62.5-25 MCG/ACT IN AEPB: 1.0000 | INHALATION_SPRAY | RESPIRATORY_TRACT | Status: DC | PRN

## 2024-02-26 MED ORDER — TRELEGY ELLIPTA 200-62.5-25 MCG/ACT IN AEPB: 1.0000 | INHALATION_SPRAY | Freq: Every day | RESPIRATORY_TRACT | 5 refills | Status: AC

## 2024-02-26 NOTE — Patient Instructions (Addendum)
 Set up for HRCT chest.  Stop Advair  , Begin Trelegy 1 puff daily  Albuterol  inhaler As needed   Begin Singulair  10mg   At bedtime   Begin Claritin daily for 2 weeks and then as needed  Activity as tolerated.  Follow up in 3 months with Dr. Jude  with PFT and As needed

## 2024-02-26 NOTE — Addendum Note (Signed)
 Addended by: ALINDA WINN KIDD on: 02/26/2024 12:01 PM   Modules accepted: Orders

## 2024-02-26 NOTE — Progress Notes (Signed)
 @Patient  ID: [name], female    DOB: 1951/01/17, 73 y.o.   MRN: [id number]  Chief Complaint  Patient presents with   Follow-up    Referring provider: Verdia Lombard, MD  HPI: 73 yo female former smoker followed for Asthma  Medical history significant for CHF, Lymphoma, and Lupus (on Plaquenil ).  Previous bone marrow biopsy negative for Amyloid  TEST/EVENTS :  11/2016 CT abdomen: Shows mild chronic bibasilar pulmonary scarring, likely postinflammatory 08/2021 PFTs: FEV1 61%, FVC 57%, ratio 83%, 17% improvement in FVC with albuterol . 01/20/2022 CXR 2 view: lungs are clear.  Allergen panel 08/2021 neg , IgE <2  Hospitalization-52025 Asthma flare (+Rhinovirus)  02/26/2024 Follow up ; Asthma  Discussed the use of AI scribe software for clinical note transcription with the patient, who gave verbal consent to proceed.  History of Present Illness [name] is a 73 year old female with asthma and lupus who presents for a two-month follow-up after an asthma flare-up.  She was hospitalized in May due to an asthma flare-up triggered by a viral infection(Rhinovirus+). Shortly after discharge experienced another asthma flare. Treated with antibiotics and steroid taper.  She is feeling better feels that she is back to baseline.  Remains on Advair  twice daily.  Endorses compliance.  Complains of intermittent hoarseness mainly when she goes outside.. She experiences asthma symptoms such as cough and wheezing, particularly when exposed to triggers like smells, grass, gas, paint, and perfume. These exposures also affect her sinuses, which she feels contributes to her asthma symptoms.  Previous CT abdomen showed some mild bibasilar pulmonary scarring likely postinflammatory.  Chest x-ray May 2025 showed no acute process.  Her past medical history includes lupus, for which she is on Plaquenil . previously undergone chemotherapy for lymphoma.   She is physically active within her home but  does not walk outside. She has a dog that stays outside and no other pets or environmental factors at home that could trigger her symptoms.  No cough at present.    Allergies  Allergen Reactions   Iodinated Contrast Media Other (See Comments)    Kidney failure.    Erythromycin  Base Itching    Red streaks on arms   Other Cough    Smells of perfume, body wash, lotion, gas, and paint.  Wheezing    Ciprofloxacin Itching and Rash   Oxycodone Other (See Comments)    Hallucination.    Immunization History  Administered Date(s) Administered   Fluad Quad(high Dose 65+) 04/12/2022   INFLUENZA, HIGH DOSE SEASONAL PF 06/11/2014, 04/14/2015, 05/16/2016   Influenza, Quadrivalent, Recombinant, Inj, Pf 03/21/2017, 04/10/2018, 02/22/2019, 04/10/2020, 05/04/2021   Influenza-Unspecified 06/08/2011, 04/05/2012, 05/18/2012, 05/30/2012, 05/24/2013   PFIZER(Purple Top)SARS-COV-2 Vaccination 08/16/2019, 09/10/2019, 04/03/2020   PNEUMOCOCCAL CONJUGATE-20 11/09/2021   Pneumococcal Conjugate-13 09/12/2017   Pneumococcal Polysaccharide-23 05/27/2002, 10/17/2012, 08/17/2018, 10/11/2019   Tdap 04/05/2012   Zoster Recombinant(Shingrix) 11/02/2021, 03/22/2022    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   Near syncope 08/30/2019   PONV (postoperative nausea and vomiting)    Pure hypercholesterolemia 05/31/2022   Stroke (HCC)    lt side weakness    Tobacco History: Social History   Tobacco Use  Smoking Status Former  Current packs/day: 0.00   Average packs/day: 1 pack/day for 10.0 years (10.0 ttl pk-yrs)   Types: Cigarettes   Start date: 06/17/1984   Quit date: 06/17/1994   Years since quitting: 29.7   Passive  exposure: Past  Smokeless Tobacco Never   Counseling given: Not Answered   Outpatient Medications Prior to Visit  Medication Sig Dispense Refill   acetaminophen  (TYLENOL ) 500 MG tablet Take 1,000 mg by mouth every 6 (six) hours as needed for mild pain or headache.     albuterol  (PROVENTIL ) (2.5 MG/3ML) 0.083% nebulizer solution Take 3 mLs (2.5 mg total) by nebulization every 4 (four) hours as needed for wheezing or shortness of breath. 75 mL 0   albuterol  (VENTOLIN  HFA) 108 (90 Base) MCG/ACT inhaler Inhale 2 puffs into the lungs every 6 (six) hours as needed for wheezing or shortness of breath. 8 g 6   aspirin  81 MG chewable tablet Chew 81 mg by mouth daily.     carboxymethylcellulose (REFRESH PLUS) 0.5 % SOLN Place 2 drops into both eyes daily as needed (dry eye).     cholecalciferol  (VITAMIN D ) 1000 UNITS tablet Take 1,000 Units by mouth daily.      doxycycline  (VIBRA -TABS) 100 MG tablet Take 1 tablet (100 mg total) by mouth 2 (two) times daily. 14 tablet 0   fluconazole  (DIFLUCAN ) 150 MG tablet Take 1 tablet (150 mg total) by mouth daily. 1 tablet 0   fluorometholone (EFLONE) 0.1 % ophthalmic suspension Place 2 drops into the left eye 2 (two) times daily.     guaiFENesin  (MUCINEX ) 600 MG 12 hr tablet Take 1 tablet (600 mg total) by mouth 2 (two) times daily.     HYDROcodone  bit-homatropine (HYCODAN) 5-1.5 MG/5ML syrup Take 5 mLs by mouth every 6 (six) hours as needed for cough. 120 mL 0   hydroxychloroquine  (PLAQUENIL ) 200 MG tablet Take 200 mg by mouth daily.     latanoprost  (XALATAN ) 0.005 % ophthalmic solution Place 1 drop into both eyes at bedtime.     losartan  (COZAAR ) 100 MG tablet Take 100 mg by mouth daily.     pantoprazole  (PROTONIX ) 40 MG tablet Take 40 mg by mouth daily.     potassium chloride  SA (KLOR-CON  M) 20 MEQ tablet Take 1 tablet (20 mEq total) by mouth 2 (two) times daily. 2 in the morning and 1 in the evening 60 tablet 11   predniSONE  (DELTASONE ) 10 MG tablet Take 4  tabs  daily with food x 4 days, then 3 tabs daily x 4 days, then 2 tabs daily x 4 days, then 1 tab daily x4 days then stop. #40 40 tablet 0   Probiotic, Lactobacillus, CAPS Take 1 capsule by mouth daily.     rosuvastatin  (CRESTOR ) 20 MG tablet TAKE 1 TABLET BY MOUTH DAILY 90 tablet 2   spironolactone  (ALDACTONE ) 25 MG tablet Take 0.5 tablets (12.5 mg total) by mouth daily. 30 tablet 11   torsemide  (DEMADEX ) 20 MG tablet Take 3 tablets by mouth once daily 270 tablet 2   traZODone  (DESYREL ) 50 MG tablet TAKE 1 TABLET BY MOUTH AT BEDTIME AS NEEDED FOR SLEEP 30 tablet 0   Turmeric 500 MG CAPS Take 500 mg by mouth daily.     valACYclovir  (VALTREX ) 1000 MG tablet Take 1,000 mg by mouth 2 (two) times daily as needed (breakouts).     fluticasone -salmeterol (ADVAIR  HFA) 230-21 MCG/ACT inhaler Inhale 2 puffs into the lungs 2 (two) times daily. 1 each 5   No facility-administered  medications prior to visit.     Review of Systems:   Constitutional:   No  weight loss, night sweats,  Fevers, chills, +fatigue, or  lassitude.  HEENT:   No headaches,  Difficulty swallowing,  Tooth/dental problems, or  Sore throat,                No sneezing, itching, ear ache, +nasal congestion, post nasal drip,   CV:  No chest pain,  Orthopnea, PND, swelling in lower extremities, anasarca, dizziness, palpitations, syncope.   GI  No heartburn, indigestion, abdominal pain, nausea, vomiting, diarrhea, change in bowel habits, loss of appetite, bloody stools.   Resp:    No wheezing.  No chest wall deformity  Skin: no rash or lesions.  GU: no dysuria, change in color of urine, no urgency or frequency.  No flank pain, no hematuria   MS:  No joint pain or swelling.  No decreased range of motion.  No back pain.    Physical Exam  BP (!) 112/52 (BP Location: Left Arm, Patient Position: Sitting, Cuff Size: Large)   Pulse 71   Ht 5' 6 (1.676 m)   Wt 183 lb (83 kg)   SpO2 98%   BMI 29.54 kg/m   GEN: A/Ox3; pleasant ,  NAD, well nourished    HEENT:  [place]olin/AT,   NOSE-clear, THROAT-clear, no lesions, no postnasal drip or exudate noted.   NECK:  Supple w/ fair ROM; no JVD; normal carotid impulses w/o bruits; no thyromegaly or nodules palpated; no lymphadenopathy.    RESP  Clear  P & A; w/o, wheezes/ rales/ or rhonchi. no accessory muscle use, no dullness to percussion  CARD:  RRR, no m/r/g, no peripheral edema, pulses intact, no cyanosis or clubbing.  GI:   Soft & nt; nml bowel sounds; no organomegaly or masses detected.   Musco: Warm bil, no deformities or joint swelling noted.   Neuro: alert, no focal deficits noted.    Skin: Warm, no lesions or rashes    Lab Results:  CBC     Imaging: No results found.  methylPREDNISolone  acetate (DEPO-MEDROL ) injection 80 mg     Date Action Dose Route User   01/01/2024 1027 Given 80 mg Intramuscular (Left Quadriceps) Trudy Warren CROME, CMA          Latest Ref Rng & Units 09/03/2021    9:55 AM  PFT Results  FVC-Pre L 1.47   FVC-Predicted Pre % 57   FVC-Post L 1.65   FVC-Predicted Post % 65   Pre FEV1/FVC % % 83   Post FEV1/FCV % % 86   FEV1-Pre L 1.22   FEV1-Predicted Pre % 61   FEV1-Post L 1.42     Lab Results  Component Value Date   NITRICOXIDE 31 02/10/2022        Assessment & Plan:   No problem-specific Assessment & Plan notes found for this encounter.  Assessment and Plan Assessment & Plan Moderate persistent asthma with recurrent exacerbations   Her moderate persistent asthma is exacerbated by viral infections and environmental triggers like smells and allergens. Recent flare-ups required hospitalization and treatment with antibiotics and prednisone . Increase Inhaler to Trelegy inhaler, . She will switch to Trelegy, one puff daily, with a sample provided. If cost is an issue, formulary options will be explored. Singulair  is considered for managing allergic responses, especially environmental triggers. She will take Singulair   daily . Add Claritin daily for two weeks, then as needed. A HRCT scan of the lungs  is ordered to assess for worsening scarring and evaluate potential causes of recurrent exacerbations. Check full PFT on return visit.   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 Plaquenil , the current medication, may lower immune response, increasing susceptibility to infections. Monitoring for lupus-related complications and considering the impact on respiratory health is necessary. Fall flu shot recommended   Allergic Rhinitis -Add Singulair  and Claritin   Plan Patient Instructions  Set up for HRCT chest.  Stop Advair  , Begin Trelegy 1 puff daily  Albuterol  inhaler As needed   Begin Singulair  10mg   At bedtime   Begin Claritin daily for 2 weeks and then as needed  Activity as tolerated.  Follow up in 3 months with Dr. Jude  with PFT and As needed             Madelin Stank, NP 02/26/2024

## 2024-02-27 ENCOUNTER — Other Ambulatory Visit: Payer: Self-pay | Admitting: Hematology & Oncology

## 2024-02-27 DIAGNOSIS — R2681 Unsteadiness on feet: Secondary | ICD-10-CM | POA: Diagnosis not present

## 2024-02-27 DIAGNOSIS — Z9181 History of falling: Secondary | ICD-10-CM | POA: Diagnosis not present

## 2024-02-29 DIAGNOSIS — Z9181 History of falling: Secondary | ICD-10-CM | POA: Diagnosis not present

## 2024-02-29 DIAGNOSIS — R2681 Unsteadiness on feet: Secondary | ICD-10-CM | POA: Diagnosis not present

## 2024-03-05 DIAGNOSIS — Z8579 Personal history of other malignant neoplasms of lymphoid, hematopoietic and related tissues: Secondary | ICD-10-CM | POA: Diagnosis not present

## 2024-03-05 DIAGNOSIS — Z79899 Other long term (current) drug therapy: Secondary | ICD-10-CM | POA: Diagnosis not present

## 2024-03-05 DIAGNOSIS — M3501 Sicca syndrome with keratoconjunctivitis: Secondary | ICD-10-CM | POA: Diagnosis not present

## 2024-03-05 DIAGNOSIS — M329 Systemic lupus erythematosus, unspecified: Secondary | ICD-10-CM | POA: Diagnosis not present

## 2024-03-05 DIAGNOSIS — M150 Primary generalized (osteo)arthritis: Secondary | ICD-10-CM | POA: Diagnosis not present

## 2024-03-06 ENCOUNTER — Telehealth: Payer: Self-pay | Admitting: *Deleted

## 2024-03-06 ENCOUNTER — Telehealth: Payer: Self-pay

## 2024-03-06 DIAGNOSIS — Z9181 History of falling: Secondary | ICD-10-CM | POA: Diagnosis not present

## 2024-03-06 DIAGNOSIS — M4712 Other spondylosis with myelopathy, cervical region: Secondary | ICD-10-CM | POA: Diagnosis not present

## 2024-03-06 DIAGNOSIS — Z6830 Body mass index (BMI) 30.0-30.9, adult: Secondary | ICD-10-CM | POA: Diagnosis not present

## 2024-03-06 DIAGNOSIS — R2681 Unsteadiness on feet: Secondary | ICD-10-CM | POA: Diagnosis not present

## 2024-03-06 NOTE — Telephone Encounter (Signed)
   Pre-operative Risk Assessment    Patient Name: [name]  DOB: 01/26/1951 MRN: [id number]   Date of last office visit: 08/30/23 [facility]  Date of next office visit: NONE   Request for Surgical Clearance    Procedure:  THORACIC LAMINECTOMY   Date of Surgery:  Clearance TBD                                Surgeon:  DR. JONATHAN THOMAS Surgeon's Group or Practice Name:  [facility] Phone number:  [phone] Fax number:  [phone] NIKKI    Type of Clearance Requested:   - Medical  - Pharmacy:  Hold Aspirin      Type of Anesthesia:  General    Additional requests/questions:    Bonney Niels Jest   03/06/2024, 4:01 PM

## 2024-03-06 NOTE — Telephone Encounter (Signed)
 Preop tele appt now scheduled, med rec and consent done.

## 2024-03-06 NOTE — Telephone Encounter (Signed)
  Patient Consent for Virtual Visit        [name] has provided verbal consent on 03/06/2024 for a virtual visit (video or telephone).   CONSENT FOR VIRTUAL VISIT FOR:  [name]  By participating in this virtual visit I agree to the following:  I hereby voluntarily request, consent and authorize [facility] and its employed or contracted physicians, physician assistants, nurse practitioners or other licensed health care professionals (the Practitioner), to provide me with telemedicine health care services (the "Services) as deemed necessary by the treating Practitioner. I acknowledge and consent to receive the Services by the Practitioner via telemedicine. I understand that the telemedicine visit will involve communicating with the Practitioner through live audiovisual communication technology and the disclosure of certain medical information by electronic transmission. I acknowledge that I have been given the opportunity to request an in-person assessment or other available alternative prior to the telemedicine visit and am voluntarily participating in the telemedicine visit.  I understand that I have the right to withhold or withdraw my consent to the use of telemedicine in the course of my care at any time, without affecting my right to future care or treatment, and that the Practitioner or I may terminate the telemedicine visit at any time. I understand that I have the right to inspect all information obtained and/or recorded in the course of the telemedicine visit and may receive copies of available information for a reasonable fee.  I understand that some of the potential risks of receiving the Services via telemedicine include:  Delay or interruption in medical evaluation due to technological equipment failure or disruption; Information transmitted may not be sufficient (e.g. poor resolution of images) to allow for appropriate medical decision making by the  Practitioner; and/or  In rare instances, security protocols could fail, causing a breach of personal health information.  Furthermore, I acknowledge that it is my responsibility to provide information about my medical history, conditions and care that is complete and accurate to the best of my ability. I acknowledge that Practitioner's advice, recommendations, and/or decision may be based on factors not within their control, such as incomplete or inaccurate data provided by me or distortions of diagnostic images or specimens that may result from electronic transmissions. I understand that the practice of medicine is not an exact science and that Practitioner makes no warranties or guarantees regarding treatment outcomes. I acknowledge that a copy of this consent can be made available to me via my patient portal [facility] MyChart), or I can request a printed copy by calling the office of [facility].    I understand that my insurance will be billed for this visit.   I have read or had this consent read to me. I understand the contents of this consent, which adequately explains the benefits and risks of the Services being provided via telemedicine.  I have been provided ample opportunity to ask questions regarding this consent and the Services and have had my questions answered to my satisfaction. I give my informed consent for the services to be provided through the use of telemedicine in my medical care

## 2024-03-06 NOTE — Telephone Encounter (Signed)
   Name: [name]  DOB: [birth year]  MRN: [id number]  Primary Cardiologist: Annabella Scarce, MD   Preoperative team, please contact this patient and set up a phone call appointment for further preoperative risk assessment. Please obtain consent and complete medication review. Thank you for your help.  I confirm that guidance regarding antiplatelet and oral anticoagulation therapy has been completed and, if necessary, noted below  Patient is on Aspirin  for history of CVA. Does not appear to have any cardiac indication for aspirin .   I also confirmed the patient resides in the state of [place] . As per [facility] Medical Board telemedicine laws, the patient must reside in the state in which the provider is licensed.   [name], PA-C 03/06/2024, 4:15 PM  [facility]

## 2024-03-14 ENCOUNTER — Ambulatory Visit: Attending: Cardiology

## 2024-03-14 DIAGNOSIS — Z01810 Encounter for preprocedural cardiovascular examination: Secondary | ICD-10-CM | POA: Diagnosis not present

## 2024-03-14 DIAGNOSIS — R2681 Unsteadiness on feet: Secondary | ICD-10-CM | POA: Diagnosis not present

## 2024-03-14 DIAGNOSIS — Z9181 History of falling: Secondary | ICD-10-CM | POA: Diagnosis not present

## 2024-03-14 NOTE — Progress Notes (Signed)
 Virtual Visit via Telephone Note   Because of BRYNLEY CUDDEBACK co-morbid illnesses, she is at least at moderate risk for complications without adequate follow up.  This format is felt to be most appropriate for this patient at this time.  Due to technical limitations with video connection (technology), today's appointment will be conducted as an audio only telehealth visit, and [name] verbally agreed to proceed in this manner.   All issues noted in this document were discussed and addressed.  No physical exam could be performed with this format.  Evaluation Performed:  Preoperative cardiovascular risk assessment _____________   Date:  03/14/2024   Patient ID:  [name], DOB February 08, 1951, MRN [id number] Patient Location:  Home Provider location:   Office  Primary Care Provider:  Verdia Lombard, MD Primary Cardiologist:  Annabella Scarce, MD  Chief Complaint / Patient Profile   73 y.o. y/o female with a h/o prior stroke, HFpEF, rheumatoid arthritis, lupus, interstitial lung disease, hypertension, prior lymphoplasmacytic lymphoma and asthma who is pending thoracic laminectomy and presents today for telephonic preoperative cardiovascular risk assessment.  History of Present Illness    [name] is a 73 y.o. female who presents via audio/video conferencing for a telehealth visit today.  Pt was last seen in cardiology clinic on 08/30/2023 by Dr. Scarce.  At that time [name] was doing well.  The patient is now pending procedure as outlined above. Since her last visit, she feels good. She has not had any symptoms lately. No swelling other than when its really hot. No fast or racing heart beats, no chest pain or SOB. She is able to walk 1-2 blocks. She is unsteady with her walking. She does meet 4 mets with her activity.  Patient is on Aspirin  for history of CVA. Does not appear to have any cardiac indication for aspirin .   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 2022 - right   MASS EXCISION     back   REPLACEMENT TOTAL HIP W/  RESURFACING IMPLANTS Left 09/2022   rortator     rotator cuff   TOTAL SHOULDER ARTHROPLASTY Left 08/16/2018   Procedure: LEFT SHOULDER REVERSE TOTAL SHOULDER ARTHROPLASTY AND BICEPS TENOTOMY;  Surgeon: Dozier Soulier, MD;  Location: [facility] OR;  Service: Orthopedics;  Laterality: Left;    Allergies  Allergies  Allergen Reactions   Iodinated Contrast Media Other (See Comments)    Kidney failure.    Erythromycin  Base Itching    Red streaks on arms   Other Cough    Smells of perfume, body wash, lotion, gas, and paint.  Wheezing  Ciprofloxacin Itching and Rash   Oxycodone Other (See Comments)    Hallucination.    Home Medications    Prior to Admission medications   Medication Sig Start Date End Date Taking? Authorizing Provider  acetaminophen  (TYLENOL ) 500 MG tablet Take 1,000 mg by mouth every 6 (six) hours as needed for mild pain or headache.    [provider]  albuterol  (PROVENTIL ) (2.5 MG/3ML) 0.083% nebulizer solution Take 3 mLs (2.5 mg total) by nebulization every 4 (four) hours as needed for wheezing or shortness of breath. 12/01/23 11/30/24  Vann, Jessica U, DO  albuterol  (VENTOLIN   HFA) 108 (90 Base) MCG/ACT inhaler Inhale 2 puffs into the lungs every 6 (six) hours as needed for wheezing or shortness of breath. 12/21/23   Parrett, Madelin RAMAN, NP  aspirin  81 MG chewable tablet Chew 81 mg by mouth daily.    [provider]  carboxymethylcellulose (REFRESH PLUS) 0.5 % SOLN Place 2 drops into both eyes daily as needed (dry eye).    [provider]  cholecalciferol  (VITAMIN D ) 1000 UNITS tablet Take 1,000 Units by mouth daily.     [provider]  doxycycline  (VIBRA -TABS) 100 MG tablet Take 1 tablet (100 mg total) by mouth 2 (two) times daily. 01/01/24   Jude Harden GAILS, MD  fluconazole  (DIFLUCAN ) 150 MG tablet Take 1 tablet (150 mg total) by mouth daily. 12/11/23   Franchot Lauraine HERO, NP  fluorometholone (EFLONE) 0.1 % ophthalmic suspension Place 2 drops into the left eye 2 (two) times daily.    [provider]  Fluticasone -Umeclidin-Vilant (TRELEGY ELLIPTA ) 200-62.5-25 MCG/ACT AEPB Inhale 1 puff into the lungs daily. 02/26/24   Parrett, Madelin RAMAN, NP  Fluticasone -Umeclidin-Vilant (TRELEGY ELLIPTA ) 200-62.5-25 MCG/ACT AEPB Inhale 1 puff into the lungs as needed. 02/26/24   Parrett, Madelin RAMAN, NP  guaiFENesin  (MUCINEX ) 600 MG 12 hr tablet Take 1 tablet (600 mg total) by mouth 2 (two) times daily. 12/01/23   Vann, Jessica U, DO  HYDROcodone  bit-homatropine (HYCODAN) 5-1.5 MG/5ML syrup Take 5 mLs by mouth every 6 (six) hours as needed for cough. 12/01/23   Vann, Jessica U, DO  hydroxychloroquine  (PLAQUENIL ) 200 MG tablet Take 200 mg by mouth daily.    [provider]  latanoprost  (XALATAN ) 0.005 % ophthalmic solution Place 1 drop into both eyes at bedtime. 04/13/18   [provider]  losartan  (COZAAR ) 100 MG tablet Take 100 mg by mouth daily.    [provider]  montelukast  (SINGULAIR ) 10 MG tablet Take 1 tablet (10 mg total) by mouth at bedtime. 02/26/24   Parrett, Madelin RAMAN, NP  pantoprazole  (PROTONIX ) 40 MG tablet Take 40 mg by mouth daily.     [provider]  potassium chloride  SA (KLOR-CON  M) 20 MEQ tablet Take 1 tablet (20 mEq total) by mouth 2 (two) times daily. 2 in the morning and 1 in the evening 04/13/23   Vannie Reche RAMAN, NP  predniSONE  (DELTASONE ) 10 MG tablet Take 4 tabs  daily with food x 4 days, then 3 tabs daily x 4 days, then 2 tabs daily x 4 days, then 1 tab daily x4 days then stop. #40 01/01/24   Jude Harden GAILS, MD  Probiotic, Lactobacillus, CAPS Take 1 capsule by mouth daily.    [provider]  rosuvastatin  (CRESTOR ) 20 MG tablet TAKE 1 TABLET BY MOUTH DAILY 12/14/23   Raford Riggs, MD  spironolactone  (ALDACTONE ) 25 MG tablet Take 0.5 tablets (12.5 mg total) by mouth daily. 04/13/23 03/06/24  Vannie,  Caitlin S, NP  torsemide  (DEMADEX ) 20 MG tablet Take 3 tablets by mouth once daily 01/16/24   Raford Riggs, MD  traZODone  (DESYREL ) 50 MG tablet TAKE 1 TABLET BY MOUTH AT BEDTIME AS NEEDED FOR SLEEP 02/27/24   Timmy Maude SAUNDERS, MD  Turmeric 500 MG CAPS Take 500 mg by mouth daily.    [provider]  valACYclovir  (VALTREX ) 1000 MG tablet Take 1,000 mg by mouth 2 (two) times daily as needed (breakouts). 02/10/23   [provider]    Physical Exam    Vital Signs:  LUVINA POIRIER does not have vital signs available for review today.  Given telephonic nature of communication, physical exam is limited. AAOx3. NAD. Normal affect.  Speech and respirations are unlabored.  Accessory Clinical Findings    None  Assessment & Plan    1.  Preoperative Cardiovascular Risk Assessment:  Ms. Goon perioperative risk of a major cardiac event is 0.9% according to the Revised Cardiac Risk Index (RCRI).  Therefore, she is at low risk for perioperative complications.   Her functional capacity is good at 5.07 METs according to the Duke Activity Status Index (DASI). Recommendations: According to ACC/AHA guidelines, no further cardiovascular testing needed.  The patient may proceed to surgery at  acceptable risk.    The patient was advised that if she develops new symptoms prior to surgery to contact our office to arrange for a follow-up visit, and she verbalized understanding.   A copy of this note will be routed to requesting surgeon.  Time:   Today, I have spent 7 minutes with the patient with telehealth technology discussing medical history, symptoms, and management plan.     Orren LOISE Fabry, PA-C  03/14/2024, 12:48 PM

## 2024-03-18 DIAGNOSIS — R2681 Unsteadiness on feet: Secondary | ICD-10-CM | POA: Diagnosis not present

## 2024-03-18 DIAGNOSIS — Z9181 History of falling: Secondary | ICD-10-CM | POA: Diagnosis not present

## 2024-03-21 DIAGNOSIS — H16121 Filamentary keratitis, right eye: Secondary | ICD-10-CM | POA: Diagnosis not present

## 2024-03-21 DIAGNOSIS — H119 Unspecified disorder of conjunctiva: Secondary | ICD-10-CM | POA: Diagnosis not present

## 2024-03-21 DIAGNOSIS — Z961 Presence of intraocular lens: Secondary | ICD-10-CM | POA: Diagnosis not present

## 2024-03-21 DIAGNOSIS — H1132 Conjunctival hemorrhage, left eye: Secondary | ICD-10-CM | POA: Diagnosis not present

## 2024-03-21 DIAGNOSIS — Z9842 Cataract extraction status, left eye: Secondary | ICD-10-CM | POA: Diagnosis not present

## 2024-03-21 DIAGNOSIS — H40003 Preglaucoma, unspecified, bilateral: Secondary | ICD-10-CM | POA: Diagnosis not present

## 2024-03-21 DIAGNOSIS — Z79899 Other long term (current) drug therapy: Secondary | ICD-10-CM | POA: Diagnosis not present

## 2024-03-21 DIAGNOSIS — H16223 Keratoconjunctivitis sicca, not specified as Sjogren's, bilateral: Secondary | ICD-10-CM | POA: Diagnosis not present

## 2024-03-21 DIAGNOSIS — H16229 Keratoconjunctivitis sicca, not specified as Sjogren's, unspecified eye: Secondary | ICD-10-CM | POA: Diagnosis not present

## 2024-03-21 DIAGNOSIS — Z9841 Cataract extraction status, right eye: Secondary | ICD-10-CM | POA: Diagnosis not present

## 2024-03-26 ENCOUNTER — Other Ambulatory Visit: Payer: Self-pay | Admitting: Hematology & Oncology

## 2024-03-29 ENCOUNTER — Other Ambulatory Visit: Payer: Self-pay | Admitting: Neurosurgery

## 2024-03-29 ENCOUNTER — Ambulatory Visit (HOSPITAL_BASED_OUTPATIENT_CLINIC_OR_DEPARTMENT_OTHER)
Admission: RE | Admit: 2024-03-29 | Discharge: 2024-03-29 | Disposition: A | Discharge status: Home / Self Care | Source: Ambulatory Visit | Attending: Adult Health | Admitting: Adult Health

## 2024-03-29 DIAGNOSIS — R918 Other nonspecific abnormal finding of lung field: Secondary | ICD-10-CM | POA: Diagnosis not present

## 2024-03-29 DIAGNOSIS — Z8572 Personal history of non-Hodgkin lymphomas: Secondary | ICD-10-CM | POA: Diagnosis not present

## 2024-03-29 DIAGNOSIS — J4540 Moderate persistent asthma, uncomplicated: Secondary | ICD-10-CM | POA: Insufficient documentation

## 2024-03-29 DIAGNOSIS — R053 Chronic cough: Secondary | ICD-10-CM | POA: Diagnosis not present

## 2024-03-29 DIAGNOSIS — J45901 Unspecified asthma with (acute) exacerbation: Secondary | ICD-10-CM | POA: Diagnosis not present

## 2024-04-01 DIAGNOSIS — H16121 Filamentary keratitis, right eye: Secondary | ICD-10-CM | POA: Diagnosis not present

## 2024-04-01 DIAGNOSIS — Z79899 Other long term (current) drug therapy: Secondary | ICD-10-CM | POA: Diagnosis not present

## 2024-04-01 DIAGNOSIS — Z961 Presence of intraocular lens: Secondary | ICD-10-CM | POA: Diagnosis not present

## 2024-04-01 DIAGNOSIS — Z9841 Cataract extraction status, right eye: Secondary | ICD-10-CM | POA: Diagnosis not present

## 2024-04-01 DIAGNOSIS — H1132 Conjunctival hemorrhage, left eye: Secondary | ICD-10-CM | POA: Diagnosis not present

## 2024-04-01 DIAGNOSIS — H40003 Preglaucoma, unspecified, bilateral: Secondary | ICD-10-CM | POA: Diagnosis not present

## 2024-04-01 DIAGNOSIS — H119 Unspecified disorder of conjunctiva: Secondary | ICD-10-CM | POA: Diagnosis not present

## 2024-04-01 DIAGNOSIS — Z9842 Cataract extraction status, left eye: Secondary | ICD-10-CM | POA: Diagnosis not present

## 2024-04-01 DIAGNOSIS — H16229 Keratoconjunctivitis sicca, not specified as Sjogren's, unspecified eye: Secondary | ICD-10-CM | POA: Diagnosis not present

## 2024-04-01 DIAGNOSIS — H16223 Keratoconjunctivitis sicca, not specified as Sjogren's, bilateral: Secondary | ICD-10-CM | POA: Diagnosis not present

## 2024-04-02 ENCOUNTER — Ambulatory Visit: Payer: Self-pay | Admitting: Adult Health

## 2024-04-04 ENCOUNTER — Other Ambulatory Visit: Payer: Self-pay | Admitting: Internal Medicine

## 2024-04-04 DIAGNOSIS — R2681 Unsteadiness on feet: Secondary | ICD-10-CM | POA: Diagnosis not present

## 2024-04-04 DIAGNOSIS — Z9181 History of falling: Secondary | ICD-10-CM | POA: Diagnosis not present

## 2024-04-04 DIAGNOSIS — Z1231 Encounter for screening mammogram for malignant neoplasm of breast: Secondary | ICD-10-CM

## 2024-04-08 ENCOUNTER — Inpatient Hospital Stay: Admitting: Family

## 2024-04-08 ENCOUNTER — Inpatient Hospital Stay: Attending: Hematology & Oncology

## 2024-04-08 VITALS — BP 130/56 | HR 61 | Temp 98.2°F | Resp 17 | Ht 66.0 in | Wt 185.1 lb

## 2024-04-08 DIAGNOSIS — M542 Cervicalgia: Secondary | ICD-10-CM | POA: Diagnosis not present

## 2024-04-08 DIAGNOSIS — C830A Small cell b-cell lymphoma, in remission: Secondary | ICD-10-CM | POA: Diagnosis not present

## 2024-04-08 DIAGNOSIS — D508 Other iron deficiency anemias: Secondary | ICD-10-CM

## 2024-04-08 DIAGNOSIS — M549 Dorsalgia, unspecified: Secondary | ICD-10-CM | POA: Diagnosis not present

## 2024-04-08 DIAGNOSIS — E859 Amyloidosis, unspecified: Secondary | ICD-10-CM | POA: Diagnosis not present

## 2024-04-08 DIAGNOSIS — Z7952 Long term (current) use of systemic steroids: Secondary | ICD-10-CM | POA: Diagnosis not present

## 2024-04-08 DIAGNOSIS — D509 Iron deficiency anemia, unspecified: Secondary | ICD-10-CM | POA: Diagnosis not present

## 2024-04-08 DIAGNOSIS — E854 Organ-limited amyloidosis: Secondary | ICD-10-CM

## 2024-04-08 DIAGNOSIS — C8300 Small cell B-cell lymphoma, unspecified site: Secondary | ICD-10-CM

## 2024-04-08 LAB — CBC WITH DIFFERENTIAL (CANCER CENTER ONLY)
Abs Immature Granulocytes: 0.02 K/uL (ref 0.00–0.07)
Basophils Absolute: 0 K/uL (ref 0.0–0.1)
Basophils Relative: 1 %
Eosinophils Absolute: 0.1 K/uL (ref 0.0–0.5)
Eosinophils Relative: 2 %
HCT: 34.8 % — ABNORMAL LOW (ref 36.0–46.0)
Hemoglobin: 11.5 g/dL — ABNORMAL LOW (ref 12.0–15.0)
Immature Granulocytes: 0 %
Lymphocytes Relative: 29 %
Lymphs Abs: 1.8 K/uL (ref 0.7–4.0)
MCH: 31.5 pg (ref 26.0–34.0)
MCHC: 33 g/dL (ref 30.0–36.0)
MCV: 95.3 fL (ref 80.0–100.0)
Monocytes Absolute: 0.4 K/uL (ref 0.1–1.0)
Monocytes Relative: 7 %
Neutro Abs: 3.9 K/uL (ref 1.7–7.7)
Neutrophils Relative %: 61 %
Platelet Count: 149 K/uL — ABNORMAL LOW (ref 150–400)
RBC: 3.65 MIL/uL — ABNORMAL LOW (ref 3.87–5.11)
RDW: 12.6 % (ref 11.5–15.5)
WBC Count: 6.4 K/uL (ref 4.0–10.5)
nRBC: 0 % (ref 0.0–0.2)

## 2024-04-08 LAB — CMP (CANCER CENTER ONLY)
ALT: 21 U/L (ref 0–44)
AST: 26 U/L (ref 15–41)
Albumin: 4.2 g/dL (ref 3.5–5.0)
Alkaline Phosphatase: 105 U/L (ref 38–126)
Anion gap: 11 (ref 5–15)
BUN: 13 mg/dL (ref 8–23)
CO2: 27 mmol/L (ref 22–32)
Calcium: 9.3 mg/dL (ref 8.9–10.3)
Chloride: 101 mmol/L (ref 98–111)
Creatinine: 1.05 mg/dL — ABNORMAL HIGH (ref 0.44–1.00)
GFR, Estimated: 55 mL/min — ABNORMAL LOW (ref >60)
Glucose, Bld: 108 mg/dL — ABNORMAL HIGH (ref 70–99)
Potassium: 3.8 mmol/L (ref 3.5–5.1)
Sodium: 139 mmol/L (ref 135–145)
Total Bilirubin: 0.6 mg/dL (ref 0.0–1.2)
Total Protein: 7.2 g/dL (ref 6.5–8.1)

## 2024-04-08 LAB — IRON AND IRON BINDING CAPACITY (CC-WL,HP ONLY)
Iron: 48 ug/dL (ref 28–170)
Saturation Ratios: 20 % (ref 10.4–31.8)
TIBC: 239 ug/dL — ABNORMAL LOW (ref 250–450)
UIBC: 191 ug/dL

## 2024-04-08 LAB — RETICULOCYTES
Immature Retic Fract: 12.6 % (ref 2.3–15.9)
RBC.: 3.68 MIL/uL — ABNORMAL LOW (ref 3.87–5.11)
Retic Count, Absolute: 59.6 K/uL (ref 19.0–186.0)
Retic Ct Pct: 1.6 % (ref 0.4–3.1)

## 2024-04-08 LAB — FERRITIN: Ferritin: 528 ng/mL — ABNORMAL HIGH (ref 11–307)

## 2024-04-08 NOTE — Progress Notes (Signed)
 Hematology and Oncology Follow Up Visit  [name] [id number] December 11, 1950 73 y.o. 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 She is doing fairly well but having a lot of back and neck pain.  She is schedule for posterior cervical fusion on 04/16/2024 and is hoping this will give her some relief.  She has not had any new falls or syncope.  No swelling in her extremities at this time.  She has positional tingling and numbness in her right hand and both feet while laying down.  No fever, chills, n/v, cough, rash, dizziness, SOB, chest pain, palpitations, abdominal pain or changes in bowel or bladder habits.  No obvious blood loss. No bruising or petechiae.  Appetite and hydration are good. Weight is stable at 185 lbs.   ECOG Performance Status: 1 - Symptomatic but completely ambulatory  Medications:  Allergies as of 04/08/2024       Reactions   Iodinated Contrast Media Other (See Comments)   Kidney failure.    Erythromycin  Base Itching   Red streaks on arms   Other Cough   Smells of perfume, body wash, lotion, gas, and paint.  Wheezing    Ciprofloxacin Itching, Rash   Oxycodone Other (See Comments)   Hallucination.        Medication List        Accurate as of April 08, 2024 10:19 AM. If you have any questions, ask your nurse or doctor.          acetaminophen  500 MG tablet Commonly known as: TYLENOL  Take 1,000 mg by mouth every 6 (six) hours as needed for mild pain or headache.   albuterol  (2.5 MG/3ML) 0.083% nebulizer solution Commonly known as: PROVENTIL  Take 3 mLs (2.5 mg total) by nebulization every 4 (four) hours as needed for wheezing or shortness of breath.   albuterol  108 (90 Base) MCG/ACT inhaler Commonly known as: VENTOLIN  HFA Inhale 2 puffs into  the lungs every 6 (six) hours as needed for wheezing or shortness of breath.   aspirin  81 MG chewable tablet Chew 81 mg by mouth daily.   carboxymethylcellulose 0.5 % Soln Commonly known as: REFRESH PLUS Place 2 drops into both eyes daily as needed (dry eye).   cholecalciferol  1000 units tablet Commonly known as: VITAMIN D  Take 1,000 Units by mouth daily.   doxycycline  100 MG tablet Commonly known as: VIBRA -TABS Take 1 tablet (100 mg total) by mouth 2 (two) times daily.   fluconazole  150 MG tablet Commonly known as: Diflucan  Take 1 tablet (150 mg total) by mouth daily.   fluorometholone 0.1 % ophthalmic suspension Commonly known as: EFLONE Place 2 drops into the left eye 2 (two) times daily.   guaiFENesin  600 MG 12 hr tablet Commonly known as: MUCINEX  Take 1 tablet (600 mg total) by mouth 2 (two) times daily.   HYDROcodone  bit-homatropine 5-1.5 MG/5ML syrup Commonly known as: HYCODAN Take 5 mLs by mouth every 6 (six) hours as needed for cough.   hydroxychloroquine  200 MG tablet Commonly known as: PLAQUENIL  Take 200 mg by mouth daily.   latanoprost  0.005 % ophthalmic solution Commonly known as: XALATAN  Place 1 drop into both eyes at bedtime.   losartan  100 MG tablet Commonly known as: COZAAR  Take 100 mg by mouth daily.   montelukast  10  MG tablet Commonly known as: SINGULAIR  Take 1 tablet (10 mg total) by mouth at bedtime.   pantoprazole  40 MG tablet Commonly known as: PROTONIX  Take 40 mg by mouth daily.   potassium chloride  SA 20 MEQ tablet Commonly known as: KLOR-CON  M Take 1 tablet (20 mEq total) by mouth 2 (two) times daily. 2 in the morning and 1 in the evening   predniSONE  10 MG tablet Commonly known as: DELTASONE  Take 4 tabs  daily with food x 4 days, then 3 tabs daily x 4 days, then 2 tabs daily x 4 days, then 1 tab daily x4 days then stop. #40   Probiotic (Lactobacillus) Caps Take 1 capsule by mouth daily.   rosuvastatin  20 MG tablet Commonly  known as: CRESTOR  TAKE 1 TABLET BY MOUTH DAILY   spironolactone  25 MG tablet Commonly known as: ALDACTONE  Take 0.5 tablets (12.5 mg total) by mouth daily.   torsemide  20 MG tablet Commonly known as: DEMADEX  Take 3 tablets by mouth once daily   traZODone  50 MG tablet Commonly known as: DESYREL  TAKE 1 TABLET BY MOUTH AT BEDTIME AS NEEDED FOR SLEEP   Trelegy Ellipta  200-62.5-25 MCG/ACT Aepb Generic drug: Fluticasone -Umeclidin-Vilant Inhale 1 puff into the lungs daily.   Trelegy Ellipta  200-62.5-25 MCG/ACT Aepb Generic drug: Fluticasone -Umeclidin-Vilant Inhale 1 puff into the lungs as needed.   Turmeric 500 MG Caps Take 500 mg by mouth daily.   valACYclovir  1000 MG tablet Commonly known as: VALTREX  Take 1,000 mg by mouth 2 (two) times daily as needed (breakouts).        Allergies:  Allergies  Allergen Reactions   Iodinated Contrast Media Other (See Comments)    Kidney failure.    Erythromycin  Base Itching    Red streaks on arms   Other Cough    Smells of perfume, body wash, lotion, gas, and paint.  Wheezing    Ciprofloxacin Itching and Rash   Oxycodone Other (See Comments)    Hallucination.    Past Medical History, Surgical history, Social history, and Family History were reviewed and updated.  Review of Systems: All other 10 point review of systems is negative.   Physical Exam:  vitals were not taken for this visit.   Wt Readings from Last 3 Encounters:  02/26/24 183 lb (83 kg)  01/01/24 187 lb (84.8 kg)  12/21/23 188 lb (85.3 kg)    Ocular: Sclerae unicteric, pupils equal, round and reactive to light Ear-nose-throat: Oropharynx clear, dentition fair Lymphatic: No cervical or supraclavicular adenopathy Lungs no rales or rhonchi, good excursion bilaterally Heart regular rate and rhythm, no murmur appreciated Abd soft, nontender, positive bowel sounds MSK no focal spinal tenderness, no joint edema Neuro: non-focal, well-oriented, appropriate  affect Breasts: Deferred   Lab Results  Component Value Date   WBC 8.0 12/11/2023   HGB 12.0 12/11/2023   HCT 37.1 12/11/2023   MCV 97.9 12/11/2023   PLT 122 (L) 12/11/2023   Lab Results  Component Value Date   FERRITIN 724 (H) 12/11/2023   IRON 63 12/11/2023   TIBC 220 (L) 12/11/2023   UIBC 157 12/11/2023   IRONPCTSAT 29 12/11/2023   Lab Results  Component Value Date   RETICCTPCT 2.2 12/11/2023   RBC 3.76 (L) 12/11/2023   RETICCTABS 70.0 03/09/2011   Lab Results  Component Value Date   KPAFRELGTCHN 17.5 10/02/2023   LAMBDASER 18.3 10/02/2023   KAPLAMBRATIO 0.96 10/02/2023   Lab Results  Component Value Date   IGGSERUM 1,028 10/02/2023   IGA 44 (  L) 10/02/2023   IGMSERUM <5 (L) 10/02/2023   Lab Results  Component Value Date   TOTALPROTELP 6.6 10/02/2023   ALBUMINELP 3.5 10/02/2023   A1GS 0.3 10/02/2023   A2GS 1.1 (H) 10/02/2023   BETS 0.8 10/02/2023   BETA2SER 0.3 05/18/2015   GAMS 0.9 10/02/2023   MSPIKE 0.4 (H) 10/02/2023   SPEI * 05/18/2015     Chemistry      Component Value Date/Time   NA 142 12/11/2023 1008   NA 144 04/25/2023 1052   NA 142 05/18/2015 1257   K 4.3 12/11/2023 1008   K 3.6 05/18/2015 1257   CL 105 12/11/2023 1008   CL 104 01/15/2013 0844   CO2 29 12/11/2023 1008   CO2 26 05/18/2015 1257   BUN 19 12/11/2023 1008   BUN 12 04/25/2023 1052   BUN 12.6 05/18/2015 1257   CREATININE 1.18 (H) 12/11/2023 1008   CREATININE 0.8 05/18/2015 1257      Component Value Date/Time   CALCIUM  8.8 (L) 12/11/2023 1008   CALCIUM  9.1 05/18/2015 1257   ALKPHOS 83 12/11/2023 1008   ALKPHOS 122 05/18/2015 1257   AST 23 12/11/2023 1008   AST 17 05/18/2015 1257   ALT 18 12/11/2023 1008   ALT 13 05/18/2015 1257   BILITOT 0.9 12/11/2023 1008   BILITOT 0.72 05/18/2015 1257       Impression and Plan:  Ms. 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 She has been in  remission since then.  Her counts today on CBC with diff are stable.  CBC and CMP were forwarded to her rheumatologist Dr. Leeroy A. Sockman per patient's request.  Iron studies are pending. We will replace if needed.  Follow-up in 4 months.   Lauraine Pepper, NP 10/6/202510:19 AM

## 2024-04-10 ENCOUNTER — Encounter (HOSPITAL_COMMUNITY): Payer: Self-pay

## 2024-04-10 NOTE — Progress Notes (Signed)
 PCP - Dr Lequita Flor Cardiologist - Dr Annabella Scarce (last ov 08/30/23) Neurology - Dr Modena Callander (last ov 10/09/23) Oncology - Dr Maude Crease Pulmonology - Dr Harden Staff (last ov 02/26/24)  Chest x-ray - 11/29/23 EKG - 11/25/23 Stress Test - 06/06/22 ECHO - 06/02/22 Cardiac Cath - n/a  Loop - Yes, last remote check was on 12/07/21.  Sleep Study -  n/a  Diabetes - n/a  Blood Thinner Instructions:  n/a  Aspirin  & Plaquenil  Instructions: Follow your surgeon's instructions on when to stop aspirin  & plaquenil  prior to surgery,  If no instructions were given by your surgeon then you will need to call the office for those instructions.  NPO  Anesthesia review: Yes   STOP now taking any Aspirin  (unless otherwise instructed by your surgeon), Aleve, Naproxen, Ibuprofen, Motrin, Advil, Goody's, BC's, all herbal medications, fish oil, and all vitamins.   Coronavirus Screening Do you have any of the following symptoms:  Cough yes/no: No Fever (>100.61F)  yes/no: No Runny nose yes/no: No Sore throat yes/no: No Difficulty breathing/shortness of breath  yes/no: No  Have you traveled in the last 14 days and where? yes/no: No  Patient verbalized understanding of instructions that were given via phone.

## 2024-04-10 NOTE — Pre-Procedure Instructions (Signed)
 Surgical Instructions   Your procedure is scheduled on April 16, 2024. Report to [facility] Main Entrance A at 5:30 A.M., then check in with the Admitting office. Any questions or running late day of surgery: call [phone]  Questions prior to your surgery date: call [phone], Monday-Friday, 8am-4pm. If you experience any cold or flu symptoms such as cough, fever, chills, shortness of breath, etc. between now and your scheduled surgery, please notify us  at the above number.     Remember:  Do not eat or drink after midnight the night before your surgery    Take these medicines the morning of surgery with A SIP OF WATER : fluorometholone (EFLONE) ophthalmic suspension  Fluticasone -Umeclidin-Vilant (TRELEGY ELLIPTA )  pantoprazole  (PROTONIX )    May take these medicines IF NEEDED: acetaminophen  (TYLENOL )  albuterol  (VENTOLIN  HFA) inhaler - please bring inhaler with you morning of surgery Polyethyl Glycol-Propyl Glycol (SYSTANE) eye drops valACYclovir  (VALTREX )    Follow your surgeon's instructions on when to stop Aspirin .  If no instructions were given by your surgeon then you will need to call the office to get those instructions.    Please follow your prescribing physician's instructions on if/when to stop taking your hydroxychloroquine  (PLAQUENIL ).   One week prior to surgery, STOP taking any Aleve, Naproxen, Ibuprofen, Motrin, Advil, Goody's, BC's, all herbal medications, fish oil, and non-prescription vitamins.                     Do NOT Smoke (Tobacco/Vaping) for 24 hours prior to your procedure.  If you use a CPAP at night, you may bring your mask/headgear for your overnight stay.   You will be asked to remove any contacts, glasses, piercing's, hearing aid's, dentures/partials prior to surgery. Please bring cases for these items if needed.    Patients discharged the day of surgery will not be allowed to drive home, and someone needs to stay with them for 24  hours.  SURGICAL WAITING ROOM VISITATION Patients may have no more than 2 support people in the waiting area - these visitors may rotate.   Pre-op nurse will coordinate an appropriate time for 1 ADULT support person, who may not rotate, to accompany patient in pre-op.  Children under the age of 20 must have an adult with them who is not the patient and must remain in the main waiting area with an adult.  If the patient needs to stay at the hospital during part of their recovery, the visitor guidelines for inpatient rooms apply.  Please refer to the [facility] website for the visitor guidelines for any additional information.   If you received a COVID test during your pre-op visit  it is requested that you wear a mask when out in public, stay away from anyone that may not be feeling well and notify your surgeon if you develop symptoms. If you have been in contact with anyone that has tested positive in the last 10 days please notify you surgeon.      Pre-operative 4 CHG Bathing Instructions   You can play a key role in reducing the risk of infection after surgery. Your skin needs to be as free of germs as possible. You can reduce the number of germs on your skin by washing with CHG (chlorhexidine  gluconate) soap before surgery. CHG is an antiseptic soap that kills germs and continues to kill germs even after washing.   DO NOT use if you have an allergy to chlorhexidine /CHG or antibacterial soaps. If your skin  becomes reddened or irritated, stop using the CHG and notify one of our RNs at [phone].   Please shower with the CHG soap starting 4 days before surgery using the following schedule:     Please keep in mind the following:  DO NOT shave, including legs and underarms, starting the day of your first shower.   You may shave your face at any point before/day of surgery.  Place clean sheets on your bed the day you start using CHG soap. Use a clean washcloth (not used since being  washed) for each shower. DO NOT sleep with pets once you start using the CHG.   CHG Shower Instructions:  Wash your face and private area with normal soap. If you choose to wash your hair, wash first with your normal shampoo.  After you use shampoo/soap, rinse your hair and body thoroughly to remove shampoo/soap residue.  Turn the water  OFF and apply  bottle of CHG soap to a CLEAN washcloth.  Apply CHG soap ONLY FROM YOUR NECK DOWN TO YOUR TOES (washing for 3-5 minutes)  DO NOT use CHG soap on face, private areas, open wounds, or sores.  Pay special attention to the area where your surgery is being performed.  If you are having back surgery, having someone wash your back for you may be helpful. Wait 2 minutes after CHG soap is applied, then you may rinse off the CHG soap.  Pat dry with a clean towel  Put on clean clothes/pajamas   If you choose to wear lotion, please use ONLY the CHG-compatible lotions that are listed below.  Additional instructions for the day of surgery:  If you choose, you may shower the morning of surgery with an antibacterial soap.  DO NOT APPLY any lotions, deodorants, cologne, or perfumes.   Do not bring valuables to the hospital. [facility] is not responsible for any belongings/valuables. Do not wear nail polish, gel polish, artificial nails, or any other type of covering on natural nails (fingers and toes) Do not wear jewelry or makeup Put on clean/comfortable clothes.  Please brush your teeth.  Ask your nurse before applying any prescription medications to the skin.     CHG Compatible Lotions   Aveeno Moisturizing lotion  Cetaphil Moisturizing Cream  Cetaphil Moisturizing Lotion  Clairol Herbal Essence Moisturizing Lotion, Dry Skin  Clairol Herbal Essence Moisturizing Lotion, Extra Dry Skin  Clairol Herbal Essence Moisturizing Lotion, Normal Skin  Curel Age Defying Therapeutic Moisturizing Lotion with Alpha Hydroxy  Curel Extreme Care Body Lotion   Curel Soothing Hands Moisturizing Hand Lotion  Curel Therapeutic Moisturizing Cream, Fragrance-Free  Curel Therapeutic Moisturizing Lotion, Fragrance-Free  Curel Therapeutic Moisturizing Lotion, Original Formula  Eucerin Daily Replenishing Lotion  Eucerin Dry Skin Therapy Plus Alpha Hydroxy Crme  Eucerin Dry Skin Therapy Plus Alpha Hydroxy Lotion  Eucerin Original Crme  Eucerin Original Lotion  Eucerin Plus Crme Eucerin Plus Lotion  Eucerin TriLipid Replenishing Lotion  Keri Anti-Bacterial Hand Lotion  Keri Deep Conditioning Original Lotion Dry Skin Formula Softly Scented  Keri Deep Conditioning Original Lotion, Fragrance Free Sensitive Skin Formula  Keri Lotion Fast Absorbing Fragrance Free Sensitive Skin Formula  Keri Lotion Fast Absorbing Softly Scented Dry Skin Formula  Keri Original Lotion  Keri Skin Renewal Lotion Keri Silky Smooth Lotion  Keri Silky Smooth Sensitive Skin Lotion  Nivea Body Creamy Conditioning Oil  Nivea Body Extra Enriched Teacher, adult education Moisturizing Lotion Nivea Crme  Nivea Skin Firming Lotion  NutraDerm 30 Skin Lotion  NutraDerm Skin Lotion  NutraDerm Therapeutic Skin Cream  NutraDerm Therapeutic Skin Lotion  ProShield Protective Hand Cream  Provon moisturizing lotion  Please read over the following fact sheets that you were given.

## 2024-04-11 ENCOUNTER — Encounter (HOSPITAL_COMMUNITY): Payer: Self-pay

## 2024-04-11 ENCOUNTER — Other Ambulatory Visit: Payer: Self-pay

## 2024-04-11 ENCOUNTER — Encounter (HOSPITAL_COMMUNITY)
Admission: RE | Admit: 2024-04-11 | Discharge: 2024-04-11 | Disposition: A | Discharge status: Home / Self Care | Source: Ambulatory Visit | Attending: Neurosurgery | Admitting: Neurosurgery

## 2024-04-11 VITALS — BP 131/52 | HR 69 | Temp 97.6°F | Resp 18 | Ht 66.0 in | Wt 184.4 lb

## 2024-04-11 DIAGNOSIS — J45909 Unspecified asthma, uncomplicated: Secondary | ICD-10-CM | POA: Insufficient documentation

## 2024-04-11 DIAGNOSIS — N186 End stage renal disease: Secondary | ICD-10-CM | POA: Insufficient documentation

## 2024-04-11 DIAGNOSIS — E7800 Pure hypercholesterolemia, unspecified: Secondary | ICD-10-CM | POA: Insufficient documentation

## 2024-04-11 DIAGNOSIS — Z01818 Encounter for other preprocedural examination: Secondary | ICD-10-CM

## 2024-04-11 DIAGNOSIS — Z992 Dependence on renal dialysis: Secondary | ICD-10-CM | POA: Diagnosis not present

## 2024-04-11 DIAGNOSIS — Z79899 Other long term (current) drug therapy: Secondary | ICD-10-CM | POA: Insufficient documentation

## 2024-04-11 DIAGNOSIS — I5032 Chronic diastolic (congestive) heart failure: Secondary | ICD-10-CM | POA: Diagnosis not present

## 2024-04-11 DIAGNOSIS — I132 Hypertensive heart and chronic kidney disease with heart failure and with stage 5 chronic kidney disease, or end stage renal disease: Secondary | ICD-10-CM | POA: Diagnosis not present

## 2024-04-11 DIAGNOSIS — Z01812 Encounter for preprocedural laboratory examination: Secondary | ICD-10-CM | POA: Insufficient documentation

## 2024-04-11 DIAGNOSIS — M329 Systemic lupus erythematosus, unspecified: Secondary | ICD-10-CM | POA: Insufficient documentation

## 2024-04-11 DIAGNOSIS — Z87891 Personal history of nicotine dependence: Secondary | ICD-10-CM | POA: Diagnosis not present

## 2024-04-11 DIAGNOSIS — K219 Gastro-esophageal reflux disease without esophagitis: Secondary | ICD-10-CM | POA: Insufficient documentation

## 2024-04-11 DIAGNOSIS — M3500 Sicca syndrome, unspecified: Secondary | ICD-10-CM | POA: Diagnosis not present

## 2024-04-11 DIAGNOSIS — Z8673 Personal history of transient ischemic attack (TIA), and cerebral infarction without residual deficits: Secondary | ICD-10-CM | POA: Diagnosis not present

## 2024-04-11 DIAGNOSIS — M4712 Other spondylosis with myelopathy, cervical region: Secondary | ICD-10-CM | POA: Insufficient documentation

## 2024-04-11 DIAGNOSIS — D631 Anemia in chronic kidney disease: Secondary | ICD-10-CM | POA: Diagnosis not present

## 2024-04-11 HISTORY — DX: Headache, unspecified: R51.9

## 2024-04-11 HISTORY — DX: Dependence on other enabling machines and devices: Z99.89

## 2024-04-11 HISTORY — DX: Sjogren syndrome, unspecified: M35.00

## 2024-04-11 HISTORY — DX: Peripheral vascular disease, unspecified: I73.9

## 2024-04-11 HISTORY — DX: Chronic kidney disease, unspecified: N18.9

## 2024-04-11 HISTORY — DX: Herpesviral infection, unspecified: B00.9

## 2024-04-11 HISTORY — DX: Unspecified cataract: H26.9

## 2024-04-11 HISTORY — DX: Organ-limited amyloidosis: E85.4

## 2024-04-11 LAB — SURGICAL PCR SCREEN
MRSA, PCR: NEGATIVE
Staphylococcus aureus: NEGATIVE

## 2024-04-11 NOTE — Progress Notes (Signed)
 Unable to collect Type and Screen at pre-op appointment. Will need to be collect on DOS. Order modified.

## 2024-04-12 ENCOUNTER — Encounter (HOSPITAL_COMMUNITY): Payer: Self-pay

## 2024-04-12 DIAGNOSIS — H16223 Keratoconjunctivitis sicca, not specified as Sjogren's, bilateral: Secondary | ICD-10-CM | POA: Diagnosis not present

## 2024-04-12 DIAGNOSIS — Z9842 Cataract extraction status, left eye: Secondary | ICD-10-CM | POA: Diagnosis not present

## 2024-04-12 DIAGNOSIS — Z79899 Other long term (current) drug therapy: Secondary | ICD-10-CM | POA: Diagnosis not present

## 2024-04-12 DIAGNOSIS — H16121 Filamentary keratitis, right eye: Secondary | ICD-10-CM | POA: Diagnosis not present

## 2024-04-12 DIAGNOSIS — H16229 Keratoconjunctivitis sicca, not specified as Sjogren's, unspecified eye: Secondary | ICD-10-CM | POA: Diagnosis not present

## 2024-04-12 DIAGNOSIS — H119 Unspecified disorder of conjunctiva: Secondary | ICD-10-CM | POA: Diagnosis not present

## 2024-04-12 DIAGNOSIS — Z9841 Cataract extraction status, right eye: Secondary | ICD-10-CM | POA: Diagnosis not present

## 2024-04-12 DIAGNOSIS — Z961 Presence of intraocular lens: Secondary | ICD-10-CM | POA: Diagnosis not present

## 2024-04-12 DIAGNOSIS — H40003 Preglaucoma, unspecified, bilateral: Secondary | ICD-10-CM | POA: Diagnosis not present

## 2024-04-12 DIAGNOSIS — H1132 Conjunctival hemorrhage, left eye: Secondary | ICD-10-CM | POA: Diagnosis not present

## 2024-04-12 NOTE — Progress Notes (Signed)
 Anesthesia Chart Review:  Case: [id number] Date/Time: 04/16/24 0715   Procedures:      POSTERIOR CERVICAL FUSION/FORAMINOTOMY LEVEL 5 - Posterior Cervical Decompression and Instrumented Fusion C3-C4, C4-C-5, C5-C6, C6-C7, C7-T1     APPLICATION OF O-ARM   Anesthesia type: General   Diagnosis: Cervical spondylosis with myelopathy [M47.12]   Pre-op diagnosis: Cervical spondylosis with myelopathy   [facility] ROOM [facility] / [facility]   Surgeons: Debby Dorn MATSU, MD       DISCUSSION: Patient is a 73 year old female scheduled for the above procedure.  History includes former smoker (quit 06/17/1994), postoperative  N/V, HTN, hypercholesterolemia, chronic diastolic CHF, asthma, CVA (evidence of remote CVA on 2007 MRI), near syncope (2021; s/p Loop recorder 12/16/2019-12/06/2021, no significant arrhythmias on ILR), carotid stenosis (mild 08/2019), GERD, non-Hodgkin's lymphoma (s/p laparoscopic right iliac retroperitoneal dissection 03/29/2007; s/p 6 cycles of FCR followed by maintenance Rituxan), SLE, Sjogren syndrome, iron deficiency anemia, GERD, CKD (with AKI/contrast nephropathy 03/2007), gait instability, osteoarthritis (left reverse TSA 08/16/2018), cholecystectomy, spinal surgery (C5-6 ACDF 11/17/2006; L4-5 fusion 12/11/2014), right eye conjunctival lesion (s/p excision with biopsy +amyloidosis 08/31/2022), cataracts (left eys surgery 11/08/2023, right eye 10/25/2023),   Last evaluation at [facility] was on 04/08/2024 by Franchot Domino, NP.  She noted plans for upcoming posterior cervical fusion.  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 Labs stable. Follow-up in 4 months planned.  Preoperative telephonic evaluation by Lucien Colander, PA-C on 03/14/2024.  Reassuring cardiac testing in 2023 (see below). ILR removed 12/06/2021. She wrote, Ms. Northup's perioperative risk of a major cardiac event is 0.9% according to the Revised Cardiac Risk Index (RCRI).  Therefore, she is at low risk for  perioperative complications.   Her functional capacity is good at 5.07 METs according to the Duke Activity Status Index (DASI). Recommendations: According to ACC/AHA guidelines, no further cardiovascular testing needed.  The patient may proceed to surgery at acceptable risk...  Last visit with pulmonology was on 02/26/2024 with Parrett, Madelin, NP for moderate persistent asthma follow-up.  Hospitalization for asthma flare in setting of positive rhinovirus in May 2025.  Shortly after discharge she experienced another asthma flare and was treated with antibiotics and a steroid taper.  She now felt back to baseline.  She endorses compliance with Advair  twice daily.  Previous CT of the abdomen showed mild bibasilar pulmonary scarring likely postinflammatory, but fuutre HRCT chest planned to better assess. Chest x-ray in May 2025 showed no acute process. Given her exacerbations in May, she was switched to Trelegy, and Singulair  and Claritin added for allergic rhinitis. Follow-up in 3 months planned with repeat PFTs.   She had ophthalmology follow-up with Dr. Lemond on 04/12/2024. Full note is not yet available to review in [facility] Everywhere.   Last rheumatology visit noted with Dr. Mikel was on 03/05/2024 for follow-up Sjogren's disease.  She is on Plaquenil .  Not felt to have active inflammatory arthritis so did not feel she would benefit from further immunosuppression.  She previously had severe side effects on leflunomide.  Physician noted that lupus diagnosis had been more remote and potentially her initial diagnosis was a lupus and then with better understanding about Sjogren's.  Patient felt her last flare was about 4 years ago.  Additional labs ordered with plan for 69-month follow-up.  Anesthesia team to evaluate on the day of surgery.  Last aspirin  reported as 04/08/2024.   VS: BP (!) 131/52   Pulse 69   Temp 36.4 C  Resp 18   Ht 5' 6 (1.676 m)   Wt 83.6 kg   SpO2 100%   BMI 29.76  kg/m    PROVIDERS: Verdia Lombard, MD is PCP Raford Riggs, MD is cardiologist Onita Gully, MD is neurologist Timmy Coy, MD is DOUGLASS Jude Donning, MD is neurologist Mikel Czar, MD is rheumatologist Elester Credit, MD is ophthalmologist   LABS: Most recent lab results in [facility] include: Lab Results  Component Value Date   WBC 6.4 04/08/2024   HGB 11.5 (L) 04/08/2024   HCT 34.8 (L) 04/08/2024   PLT 149 (L) 04/08/2024   GLUCOSE 108 (H) 04/08/2024   CHOL 167 05/31/2022   TRIG 66 05/31/2022   HDL 89 05/31/2022   LDLCALC 65 05/31/2022   ALT 21 04/08/2024   AST 26 04/08/2024   NA 139 04/08/2024   K 3.8 04/08/2024   CL 101 04/08/2024   CREATININE 1.05 (H) 04/08/2024   BUN 13 04/08/2024   CO2 27 04/08/2024   TSH 2.170 12/11/2023   INR 1.13 08/09/2018   HGBA1C 5.4 10/09/2023   She is for T&S on the day of surgery.    PFTs 09/03/2021: FEV1 61%, FVC 57%, ratio 83%, 17% improvement in FVC with albuterol .   IMAGES: CT Chest High Resolution 03/29/2024: IMPRESSION: - No pleural or pericardial effusion. - Mosaic ground-glass attenuation suggestive of small airway disease/air trapping which can be seen the setting of hypersensitivity pneumonitis. Correlate with clinical findings. - No suspicious findings to suggest lymphoma involvement. - Scattered few pulmonary micro nodules. Follow-up according to oncologic protocols. In this patient with known malignancy Fleischner guidelines can not be utilized. - Atherosclerotic calcifications of coronary arteries.   MRI C-spine 12/12/2023: IMPRESSION: - At C4-5 disc bulging and facet hypertrophy with moderate spinal stenosis and severe bilateral foraminal stenosis. - At C6-7 disc bulging with mild spinal stenosis and no foraminal stenosis. - Spinal cord is notable for subtle increased T2 hyperintense signal at C3-4 and C6-7 on sagittal views, not well-visualized on axial views. Could represent myelomalacia vs artifact.   - ACDF C5-C6.     EKG: EKG 5/242025: Sinus or ectopic atrial tachycardia Repol abnrm suggests ischemia, anterolateral mildly worsened depression Confirmed by Elnor Savant ([id number]) on 11/25/2023 7:03:58 PM   CV: Nuclear stress test 06/05/2022:   The study is normal. The study is low risk.   No ST deviation was noted. The ECG was not diagnostic due to pharmacologic protocol.   LV perfusion is normal. There is no evidence of ischemia. There is no evidence of infarction.   Left ventricular function is normal. Nuclear stress EF: 64 %. The left ventricular ejection fraction is normal (55-65%). End diastolic cavity size is normal. End systolic cavity size is normal.   Prior study available for comparison from 05/08/2018. No changes compared to prior study.    Echo 06/02/2022: IMPRESSIONS   1. Left ventricular ejection fraction, by estimation, is 65 to 70%. The  left ventricle has normal function. The left ventricle has no regional  wall motion abnormalities. Left ventricular diastolic parameters are  indeterminate. Elevated left ventricular  end-diastolic pressure. The average left ventricular global longitudinal  strain is -16.6 %. The global longitudinal strain is normal.   2. Right ventricular systolic function is normal. The right ventricular  size is normal. There is normal pulmonary artery systolic pressure.   3. The mitral valve is normal in structure. Mild mitral valve  regurgitation. No evidence of mitral stenosis.   4. The aortic valve is  tricuspid. There is mild calcification of the  aortic valve. Aortic valve regurgitation is not visualized. No aortic  stenosis is present.   5. The inferior vena cava is normal in size with greater than 50%  respiratory variability, suggesting right atrial pressure of 3 mmHg.  - Comparison(s): No significant change from prior study.    30 Day Event Monitor 10/14/2019: Quality: Fair.  Baseline artifact. Predominant rhythm: sinus  rhythm Average heart rate: 61 bpm Max heart rate: 106 bpm Min heart rate: 58 bpm - No arrhythmias    US  Carotid 08/21/2019: Summary:  - Right Carotid: Velocities in the right ICA are consistent with a 1-39%  stenosis.  - Left Carotid: Velocities in the left ICA are consistent with a 1-39%  stenosis.  - Vertebrals: Bilateral vertebral arteries demonstrate antegrade flow.  - Subclavians: Normal flow hemodynamics were seen in bilateral subclavian               arteries.    Past Medical History:  Diagnosis Date   Acid reflux    tx protonix    Ambulates with cane    straight  w/ four prongs   Arthritis    Asthma    has inhalers   Carotid stenosis 10/29/2020   Cataracts, bilateral    in [place]   Chronic diastolic heart failure (HCC) 08/30/2019   Chronic kidney disease    kidney failure 03/2007 - tx with dialysis x 3 - kidney functioned returned back to normal per pt on 04/11/24   Diastolic heart failure (HCC) 2019   Gait instability 10/29/2020   uses cane   Headache    no current problems as of 04/11/24   HSV infection    Hypertension    Iron deficiency anemia 08/04/2023   hx iron infursions   Lupus 07/13/2011   Malignant lymphoma, 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 2022 - right   MASS EXCISION     back   MRI     x several in [place]   REPLACEMENT TOTAL HIP W/  RESURFACING IMPLANTS Left 09/2022   rortator     rotator cuff   TOTAL SHOULDER ARTHROPLASTY  Left 08/16/2018   Procedure: LEFT SHOULDER REVERSE TOTAL SHOULDER ARTHROPLASTY AND BICEPS TENOTOMY;  Surgeon: Dozier Soulier, MD;  Location: [facility] OR;  Service: Orthopedics;  Laterality: Left;    MEDICATIONS:  acetaminophen  (TYLENOL ) 500 MG tablet   albuterol  (VENTOLIN  HFA) 108 (90 Base) MCG/ACT inhaler   aspirin  EC 81 MG tablet   fluorometholone (EFLONE) 0.1 % ophthalmic suspension   Fluticasone -Umeclidin-Vilant (TRELEGY ELLIPTA ) 200-62.5-25 MCG/ACT AEPB   guaiFENesin -dextromethorphan  (ROBITUSSIN DM) 100-10 MG/5ML syrup   hydroxychloroquine  (PLAQUENIL ) 200 MG tablet   latanoprost  (XALATAN ) 0.005 % ophthalmic solution   losartan  (COZAAR ) 50 MG tablet   montelukast  (SINGULAIR ) 10 MG tablet   pantoprazole  (PROTONIX ) 40 MG tablet   Polyethyl  Glycol-Propyl Glycol (SYSTANE) 0.4-0.3 % SOLN   potassium chloride  SA (KLOR-CON  M) 20 MEQ tablet   Probiotic, Lactobacillus, CAPS   pyridoxine (B-6) 100 MG tablet   rosuvastatin  (CRESTOR ) 20 MG tablet   spironolactone  (ALDACTONE ) 25 MG tablet   torsemide  (DEMADEX ) 20 MG tablet   traZODone  (DESYREL ) 50 MG tablet   Turmeric 500 MG CAPS   valACYclovir  (VALTREX ) 1000 MG tablet   VITAMIN D  PO   No current facility-administered medications for this encounter.    Isaiah Ruder, PA-C Surgical Short Stay/Anesthesiology [facility] Phone [phone] [facility] Phone [phone] 04/12/2024 10:53 PM

## 2024-04-12 NOTE — Anesthesia Preprocedure Evaluation (Addendum)
 Anesthesia Evaluation  Patient identified by MRN, date of birth, ID band Patient awake    Reviewed: Allergy & Precautions, NPO status , Patient's Chart, lab work & pertinent test results, reviewed documented beta blocker date and time   History of Anesthesia Complications (+) PONV and history of anesthetic complications  Airway Mallampati: II  TM Distance: >3 FB Neck ROM: Full    Dental no notable dental hx.    Pulmonary asthma , former smoker   Pulmonary exam normal breath sounds clear to auscultation       Cardiovascular hypertension, (-) angina + Peripheral Vascular Disease  Normal cardiovascular exam Rhythm:Regular Rate:Normal  Carotid stenosis   Neuro/Psych  Headaches Cervical stenosis  CVA    GI/Hepatic ,GERD  ,,  Endo/Other    Renal/GU CRFRenal disease     Musculoskeletal  (+) Arthritis ,    Abdominal   Peds  Hematology  (+) Blood dyscrasia, anemia   Anesthesia Other Findings   Reproductive/Obstetrics                              Anesthesia Physical Anesthesia Plan  ASA: 3  Anesthesia Plan: General   Post-op Pain Management: Sufentanil infusion   Induction: Intravenous  PONV Risk Score and Plan: 4 or greater and Ondansetron , Dexamethasone  and Treatment may vary due to age or medical condition  Airway Management Planned: Oral ETT  Additional Equipment: Arterial line  Intra-op Plan:   Post-operative Plan: Extubation in OR  Informed Consent: I have reviewed the patients History and Physical, chart, labs and discussed the procedure including the risks, benefits and alternatives for the proposed anesthesia with the patient or authorized representative who has indicated his/her understanding and acceptance.     Dental advisory given  Plan Discussed with: CRNA  Anesthesia Plan Comments: (PAT note written 04/12/2024 by Allison Zelenak, PA-C.  )          Anesthesia Quick Evaluation

## 2024-04-16 ENCOUNTER — Inpatient Hospital Stay (HOSPITAL_COMMUNITY)
Admission: RE | Admit: 2024-04-16 | Discharge: 2024-04-17 | DRG: 472 | Disposition: A | Discharge status: Home / Self Care | Attending: Neurosurgery | Admitting: Neurosurgery

## 2024-04-16 ENCOUNTER — Other Ambulatory Visit: Payer: Self-pay

## 2024-04-16 ENCOUNTER — Inpatient Hospital Stay (HOSPITAL_COMMUNITY)

## 2024-04-16 ENCOUNTER — Encounter (HOSPITAL_COMMUNITY)
Admission: RE | Disposition: A | Discharge status: Home / Self Care | Payer: Self-pay | Source: Home / Self Care | Attending: Neurosurgery

## 2024-04-16 ENCOUNTER — Inpatient Hospital Stay (HOSPITAL_COMMUNITY): Payer: Self-pay

## 2024-04-16 ENCOUNTER — Inpatient Hospital Stay (HOSPITAL_COMMUNITY): Payer: Self-pay | Admitting: Physician Assistant

## 2024-04-16 ENCOUNTER — Encounter (HOSPITAL_COMMUNITY): Payer: Self-pay

## 2024-04-16 DIAGNOSIS — I130 Hypertensive heart and chronic kidney disease with heart failure and stage 1 through stage 4 chronic kidney disease, or unspecified chronic kidney disease: Secondary | ICD-10-CM | POA: Diagnosis present

## 2024-04-16 DIAGNOSIS — I5032 Chronic diastolic (congestive) heart failure: Secondary | ICD-10-CM | POA: Diagnosis not present

## 2024-04-16 DIAGNOSIS — Z01818 Encounter for other preprocedural examination: Secondary | ICD-10-CM

## 2024-04-16 DIAGNOSIS — M4802 Spinal stenosis, cervical region: Principal | ICD-10-CM | POA: Diagnosis present

## 2024-04-16 DIAGNOSIS — M4712 Other spondylosis with myelopathy, cervical region: Secondary | ICD-10-CM | POA: Diagnosis present

## 2024-04-16 DIAGNOSIS — J4540 Moderate persistent asthma, uncomplicated: Secondary | ICD-10-CM | POA: Diagnosis not present

## 2024-04-16 DIAGNOSIS — Z8572 Personal history of non-Hodgkin lymphomas: Secondary | ICD-10-CM | POA: Diagnosis not present

## 2024-04-16 DIAGNOSIS — Z8673 Personal history of transient ischemic attack (TIA), and cerebral infarction without residual deficits: Secondary | ICD-10-CM | POA: Diagnosis not present

## 2024-04-16 DIAGNOSIS — N182 Chronic kidney disease, stage 2 (mild): Secondary | ICD-10-CM

## 2024-04-16 DIAGNOSIS — I5030 Unspecified diastolic (congestive) heart failure: Secondary | ICD-10-CM | POA: Diagnosis not present

## 2024-04-16 DIAGNOSIS — M5001 Cervical disc disorder with myelopathy,  high cervical region: Secondary | ICD-10-CM | POA: Diagnosis not present

## 2024-04-16 DIAGNOSIS — G959 Disease of spinal cord, unspecified: Secondary | ICD-10-CM

## 2024-04-16 DIAGNOSIS — I110 Hypertensive heart disease with heart failure: Secondary | ICD-10-CM | POA: Diagnosis not present

## 2024-04-16 DIAGNOSIS — J4541 Moderate persistent asthma with (acute) exacerbation: Secondary | ICD-10-CM | POA: Diagnosis not present

## 2024-04-16 DIAGNOSIS — E7800 Pure hypercholesterolemia, unspecified: Secondary | ICD-10-CM | POA: Diagnosis present

## 2024-04-16 DIAGNOSIS — R1310 Dysphagia, unspecified: Secondary | ICD-10-CM | POA: Diagnosis present

## 2024-04-16 DIAGNOSIS — Z87891 Personal history of nicotine dependence: Secondary | ICD-10-CM

## 2024-04-16 HISTORY — PX: POSTERIOR CERVICAL FUSION/FORAMINOTOMY: SHX5038

## 2024-04-16 SURGERY — POSTERIOR CERVICAL FUSION/FORAMINOTOMY LEVEL 5
Anesthesia: General

## 2024-04-16 MED ORDER — LABETALOL HCL 5 MG/ML IV SOLN
INTRAVENOUS | Status: DC | PRN
  Administered 2024-04-16 (×2): 10 mg via INTRAVENOUS

## 2024-04-16 MED ORDER — 0.9 % SODIUM CHLORIDE (POUR BTL) OPTIME
TOPICAL | Status: DC | PRN
  Administered 2024-04-16: 1000 mL

## 2024-04-16 MED ORDER — SUFENTANIL CITRATE 50 MCG/ML IV SOLN
0.5000 ug/kg/h | INTRAVENOUS | Status: AC
  Administered 2024-04-16: .2 ug/kg/h via INTRAVENOUS
  Filled 2024-04-16: qty 1

## 2024-04-16 MED ORDER — KETAMINE HCL 10 MG/ML IJ SOLN
INTRAMUSCULAR | Status: DC | PRN
  Administered 2024-04-16: 20 mg via INTRAVENOUS
  Administered 2024-04-16: 30 mg via INTRAVENOUS

## 2024-04-16 MED ORDER — FLUOROMETHOLONE ACETATE 0.1 % OP SUSP: 2.0000 [drp] | Freq: Two times a day (BID) | OPHTHALMIC | Status: DC

## 2024-04-16 MED ORDER — PHENYLEPHRINE HCL-NACL 20-0.9 MG/250ML-% IV SOLN
INTRAVENOUS | Status: DC | PRN
  Administered 2024-04-16: 10 ug/min via INTRAVENOUS

## 2024-04-16 MED ORDER — POTASSIUM CHLORIDE CRYS ER 20 MEQ PO TBCR: 20.0000 meq | EXTENDED_RELEASE_TABLET | ORAL | Status: DC

## 2024-04-16 MED ORDER — POTASSIUM CHLORIDE CRYS ER 20 MEQ PO TBCR
40.0000 meq | EXTENDED_RELEASE_TABLET | Freq: Every day | ORAL | Status: DC
Start: 2024-04-17 — End: 2024-04-17
  Administered 2024-04-17: 40 meq via ORAL
  Filled 2024-04-16: qty 2

## 2024-04-16 MED ORDER — CHLORHEXIDINE GLUCONATE CLOTH 2 % EX PADS: 6.0000 | MEDICATED_PAD | Freq: Once | CUTANEOUS | Status: DC

## 2024-04-16 MED ORDER — BUPIVACAINE HCL (PF) 0.5 % IJ SOLN
INTRAMUSCULAR | Status: DC | PRN
  Administered 2024-04-16: 7 mL

## 2024-04-16 MED ORDER — ROCURONIUM BROMIDE 10 MG/ML (PF) SYRINGE
PREFILLED_SYRINGE | INTRAVENOUS | Status: AC
  Filled 2024-04-16: qty 10

## 2024-04-16 MED ORDER — PROPOFOL 10 MG/ML IV BOLUS
INTRAVENOUS | Status: DC | PRN
  Administered 2024-04-16 (×2): 50 mg via INTRAVENOUS
  Administered 2024-04-16: 100 mg via INTRAVENOUS

## 2024-04-16 MED ORDER — SODIUM CHLORIDE 0.9% FLUSH: 3.0000 mL | INTRAVENOUS | Status: DC | PRN

## 2024-04-16 MED ORDER — HYDROCODONE-ACETAMINOPHEN 5-325 MG PO TABS: 2.0000 | ORAL_TABLET | ORAL | Status: DC | PRN

## 2024-04-16 MED ORDER — PROPOFOL 500 MG/50ML IV EMUL
INTRAVENOUS | Status: DC | PRN
  Administered 2024-04-16: 135 ug/kg/min via INTRAVENOUS

## 2024-04-16 MED ORDER — BUPIVACAINE HCL (PF) 0.5 % IJ SOLN
INTRAMUSCULAR | Status: AC
  Filled 2024-04-16: qty 30

## 2024-04-16 MED ORDER — ACETAMINOPHEN 10 MG/ML IV SOLN: 1000.0000 mg | Freq: Once | INTRAVENOUS | Status: DC | PRN

## 2024-04-16 MED ORDER — DROPERIDOL 2.5 MG/ML IJ SOLN: 0.6250 mg | Freq: Once | INTRAMUSCULAR | Status: DC | PRN

## 2024-04-16 MED ORDER — LACTATED RINGERS IV SOLN
INTRAVENOUS | Status: DC
Start: 2024-04-16 — End: 2024-04-16

## 2024-04-16 MED ORDER — MENTHOL 3 MG MT LOZG: 1.0000 | LOZENGE | OROMUCOSAL | Status: DC | PRN

## 2024-04-16 MED ORDER — SODIUM CHLORIDE 0.9 % IV SOLN: 250.0000 mL | INTRAVENOUS | Status: DC

## 2024-04-16 MED ORDER — DEXAMETHASONE SOD PHOSPHATE PF 10 MG/ML IJ SOLN
INTRAMUSCULAR | Status: DC | PRN
  Administered 2024-04-16: 10 mg via INTRAVENOUS

## 2024-04-16 MED ORDER — THROMBIN 5000 UNITS EX SOLR
CUTANEOUS | Status: DC | PRN
  Administered 2024-04-16: 5 mL via TOPICAL

## 2024-04-16 MED ORDER — DOCUSATE SODIUM 100 MG PO CAPS
100.0000 mg | ORAL_CAPSULE | Freq: Two times a day (BID) | ORAL | Status: DC
  Administered 2024-04-16 – 2024-04-17 (×2): 100 mg via ORAL
  Filled 2024-04-16 (×2): qty 1

## 2024-04-16 MED ORDER — KETAMINE HCL 50 MG/5ML IJ SOSY
PREFILLED_SYRINGE | INTRAMUSCULAR | Status: AC
  Filled 2024-04-16: qty 5

## 2024-04-16 MED ORDER — SUFENTANIL CITRATE 50 MCG/ML IV SOLN
0.2500 ug/kg/h | INTRAVENOUS | Status: AC
  Administered 2024-04-16: .3 ug/kg/h via INTRAVENOUS
  Filled 2024-04-16: qty 1

## 2024-04-16 MED ORDER — VANCOMYCIN HCL 1000 MG IV SOLR
INTRAVENOUS | Status: AC
  Filled 2024-04-16: qty 20

## 2024-04-16 MED ORDER — METHOCARBAMOL 500 MG PO TABS
500.0000 mg | ORAL_TABLET | Freq: Four times a day (QID) | ORAL | Status: DC | PRN
  Administered 2024-04-16 – 2024-04-17 (×4): 500 mg via ORAL
  Filled 2024-04-16 (×4): qty 1

## 2024-04-16 MED ORDER — SPIRONOLACTONE 12.5 MG HALF TABLET
12.5000 mg | ORAL_TABLET | Freq: Every day | ORAL | Status: DC
  Administered 2024-04-17: 12.5 mg via ORAL
  Filled 2024-04-16 (×2): qty 1

## 2024-04-16 MED ORDER — FENTANYL CITRATE (PF) 100 MCG/2ML IJ SOLN
INTRAMUSCULAR | Status: AC
  Filled 2024-04-16: qty 2

## 2024-04-16 MED ORDER — BUPIVACAINE LIPOSOME 1.3 % IJ SUSP
INTRAMUSCULAR | Status: DC | PRN
  Administered 2024-04-16: 20 mL

## 2024-04-16 MED ORDER — FENTANYL CITRATE (PF) 250 MCG/5ML IJ SOLN
INTRAMUSCULAR | Status: DC | PRN
  Administered 2024-04-16: 100 ug via INTRAVENOUS
  Administered 2024-04-16: 50 ug via INTRAVENOUS
  Administered 2024-04-16: 100 ug via INTRAVENOUS

## 2024-04-16 MED ORDER — ALBUTEROL SULFATE HFA 108 (90 BASE) MCG/ACT IN AERS: 2.0000 | INHALATION_SPRAY | Freq: Four times a day (QID) | RESPIRATORY_TRACT | Status: DC | PRN

## 2024-04-16 MED ORDER — TRAZODONE HCL 50 MG PO TABS
50.0000 mg | ORAL_TABLET | Freq: Every evening | ORAL | Status: DC | PRN
Start: 2024-04-16 — End: 2024-04-17

## 2024-04-16 MED ORDER — MONTELUKAST SODIUM 10 MG PO TABS
10.0000 mg | ORAL_TABLET | Freq: Every day | ORAL | Status: DC
  Administered 2024-04-16: 10 mg via ORAL
  Filled 2024-04-16: qty 1

## 2024-04-16 MED ORDER — CEFAZOLIN SODIUM-DEXTROSE 2-4 GM/100ML-% IV SOLN
2.0000 g | INTRAVENOUS | Status: AC
Start: 2024-04-16 — End: 2024-04-16
  Administered 2024-04-16: 2 g via INTRAVENOUS

## 2024-04-16 MED ORDER — THROMBIN 5000 UNITS EX KIT
PACK | CUTANEOUS | Status: AC
  Filled 2024-04-16: qty 1

## 2024-04-16 MED ORDER — LATANOPROST 0.005 % OP SOLN
1.0000 [drp] | Freq: Every day | OPHTHALMIC | Status: DC
  Filled 2024-04-16: qty 2.5

## 2024-04-16 MED ORDER — LOSARTAN POTASSIUM 50 MG PO TABS
50.0000 mg | ORAL_TABLET | Freq: Every day | ORAL | Status: DC
  Administered 2024-04-16: 50 mg via ORAL
  Filled 2024-04-16: qty 1

## 2024-04-16 MED ORDER — HYDROCODONE-ACETAMINOPHEN 5-325 MG PO TABS: 1.0000 | ORAL_TABLET | ORAL | Status: DC | PRN

## 2024-04-16 MED ORDER — PHENYLEPHRINE 80 MCG/ML (10ML) SYRINGE FOR IV PUSH (FOR BLOOD PRESSURE SUPPORT)
PREFILLED_SYRINGE | INTRAVENOUS | Status: DC | PRN
  Administered 2024-04-16: 80 ug via INTRAVENOUS

## 2024-04-16 MED ORDER — BUDESON-GLYCOPYRROL-FORMOTEROL 160-9-4.8 MCG/ACT IN AERO
2.0000 | INHALATION_SPRAY | Freq: Two times a day (BID) | RESPIRATORY_TRACT | Status: DC
  Administered 2024-04-17: 2 via RESPIRATORY_TRACT
  Filled 2024-04-16: qty 5.9

## 2024-04-16 MED ORDER — PHENOL 1.4 % MT LIQD: 1.0000 | OROMUCOSAL | Status: DC | PRN

## 2024-04-16 MED ORDER — ORAL CARE MOUTH RINSE: 15.0000 mL | Freq: Once | OROMUCOSAL | Status: AC

## 2024-04-16 MED ORDER — CHLORHEXIDINE GLUCONATE 0.12 % MT SOLN: 15.0000 mL | Freq: Once | OROMUCOSAL | Status: AC

## 2024-04-16 MED ORDER — GUAIFENESIN-DM 100-10 MG/5ML PO SYRP: 5.0000 mL | ORAL_SOLUTION | Freq: Three times a day (TID) | ORAL | Status: DC | PRN

## 2024-04-16 MED ORDER — VALACYCLOVIR HCL 500 MG PO TABS: 1000.0000 mg | ORAL_TABLET | Freq: Two times a day (BID) | ORAL | Status: DC | PRN

## 2024-04-16 MED ORDER — ROSUVASTATIN CALCIUM 20 MG PO TABS
20.0000 mg | ORAL_TABLET | Freq: Every day | ORAL | Status: DC
  Administered 2024-04-16: 20 mg via ORAL
  Filled 2024-04-16: qty 1

## 2024-04-16 MED ORDER — ACETAMINOPHEN 325 MG PO TABS
650.0000 mg | ORAL_TABLET | ORAL | Status: DC | PRN
  Administered 2024-04-16 – 2024-04-17 (×4): 650 mg via ORAL
  Filled 2024-04-16 (×4): qty 2

## 2024-04-16 MED ORDER — ACETAMINOPHEN 650 MG RE SUPP: 650.0000 mg | RECTAL | Status: DC | PRN

## 2024-04-16 MED ORDER — LACTATED RINGERS IV SOLN
INTRAVENOUS | Status: DC | PRN
Start: 2024-04-16 — End: 2024-04-16

## 2024-04-16 MED ORDER — ONDANSETRON HCL 4 MG PO TABS: 4.0000 mg | ORAL_TABLET | Freq: Four times a day (QID) | ORAL | Status: DC | PRN

## 2024-04-16 MED ORDER — POLYETHYLENE GLYCOL 3350 17 G PO PACK
17.0000 g | PACK | Freq: Every day | ORAL | Status: DC | PRN
  Administered 2024-04-17: 17 g via ORAL
  Filled 2024-04-16: qty 1

## 2024-04-16 MED ORDER — PROPOFOL 10 MG/ML IV BOLUS
INTRAVENOUS | Status: AC
  Filled 2024-04-16: qty 20

## 2024-04-16 MED ORDER — ONDANSETRON HCL 4 MG/2ML IJ SOLN
4.0000 mg | Freq: Four times a day (QID) | INTRAMUSCULAR | Status: DC | PRN
  Administered 2024-04-16: 4 mg via INTRAVENOUS
  Filled 2024-04-16: qty 2

## 2024-04-16 MED ORDER — SODIUM CHLORIDE 0.9% FLUSH
3.0000 mL | Freq: Two times a day (BID) | INTRAVENOUS | Status: DC
  Administered 2024-04-16: 3 mL via INTRAVENOUS

## 2024-04-16 MED ORDER — FENTANYL CITRATE (PF) 250 MCG/5ML IJ SOLN
INTRAMUSCULAR | Status: AC
  Filled 2024-04-16: qty 5

## 2024-04-16 MED ORDER — VANCOMYCIN HCL 1 G IV SOLR: INTRAVENOUS | Status: DC | PRN

## 2024-04-16 MED ORDER — ACETAMINOPHEN 500 MG PO TABS: 1000.0000 mg | ORAL_TABLET | Freq: Once | ORAL | Status: AC

## 2024-04-16 MED ORDER — ONDANSETRON HCL 4 MG/2ML IJ SOLN
INTRAMUSCULAR | Status: DC | PRN
  Administered 2024-04-16: 4 mg via INTRAVENOUS

## 2024-04-16 MED ORDER — BUPIVACAINE LIPOSOME 1.3 % IJ SUSP
INTRAMUSCULAR | Status: AC
  Filled 2024-04-16: qty 20

## 2024-04-16 MED ORDER — CEFAZOLIN SODIUM 1 G IJ SOLR
INTRAMUSCULAR | Status: AC
  Filled 2024-04-16: qty 10

## 2024-04-16 MED ORDER — POLYVINYL ALCOHOL 1.4 % OP SOLN: 2.0000 [drp] | Freq: Every day | OPHTHALMIC | Status: DC | PRN

## 2024-04-16 MED ORDER — KETOROLAC TROMETHAMINE 15 MG/ML IJ SOLN
7.5000 mg | Freq: Four times a day (QID) | INTRAMUSCULAR | Status: DC
  Administered 2024-04-16 – 2024-04-17 (×3): 7.5 mg via INTRAVENOUS
  Filled 2024-04-16 (×3): qty 1

## 2024-04-16 MED ORDER — METHOCARBAMOL 1000 MG/10ML IJ SOLN: 500.0000 mg | Freq: Four times a day (QID) | INTRAMUSCULAR | Status: DC | PRN

## 2024-04-16 MED ORDER — OXYCODONE HCL 5 MG/5ML PO SOLN: 5.0000 mg | Freq: Once | ORAL | Status: AC | PRN

## 2024-04-16 MED ORDER — PANTOPRAZOLE SODIUM 40 MG PO TBEC
40.0000 mg | DELAYED_RELEASE_TABLET | Freq: Every day | ORAL | Status: DC
  Administered 2024-04-17: 40 mg via ORAL
  Filled 2024-04-16: qty 1

## 2024-04-16 MED ORDER — CEFAZOLIN SODIUM-DEXTROSE 2-4 GM/100ML-% IV SOLN
INTRAVENOUS | Status: AC
  Filled 2024-04-16: qty 100

## 2024-04-16 MED ORDER — CEFAZOLIN SODIUM-DEXTROSE 2-4 GM/100ML-% IV SOLN
2.0000 g | Freq: Four times a day (QID) | INTRAVENOUS | Status: AC
  Administered 2024-04-16 – 2024-04-17 (×2): 2 g via INTRAVENOUS
  Filled 2024-04-16 (×2): qty 100

## 2024-04-16 MED ORDER — LIDOCAINE-EPINEPHRINE 1 %-1:100000 IJ SOLN
INTRAMUSCULAR | Status: AC
  Filled 2024-04-16: qty 1

## 2024-04-16 MED ORDER — LIDOCAINE-EPINEPHRINE 1 %-1:100000 IJ SOLN
INTRAMUSCULAR | Status: DC | PRN
  Administered 2024-04-16: 10 mL

## 2024-04-16 MED ORDER — OXYCODONE HCL 5 MG PO TABS
ORAL_TABLET | ORAL | Status: AC
  Filled 2024-04-16: qty 1

## 2024-04-16 MED ORDER — PROPOFOL 10 MG/ML IV BOLUS
INTRAVENOUS | Status: AC
Start: 2024-04-16 — End: 2024-04-16
  Filled 2024-04-16: qty 20

## 2024-04-16 MED ORDER — FENTANYL CITRATE (PF) 100 MCG/2ML IJ SOLN
25.0000 ug | INTRAMUSCULAR | Status: DC | PRN
  Administered 2024-04-16: 25 ug via INTRAVENOUS
  Administered 2024-04-16: 50 ug via INTRAVENOUS

## 2024-04-16 MED ORDER — TORSEMIDE 20 MG PO TABS
60.0000 mg | ORAL_TABLET | Freq: Every day | ORAL | Status: DC
  Administered 2024-04-17: 60 mg via ORAL
  Filled 2024-04-16: qty 3

## 2024-04-16 MED ORDER — POTASSIUM CHLORIDE CRYS ER 20 MEQ PO TBCR
20.0000 meq | EXTENDED_RELEASE_TABLET | Freq: Every day | ORAL | Status: DC
  Administered 2024-04-16: 20 meq via ORAL
  Filled 2024-04-16: qty 1

## 2024-04-16 MED ORDER — ACETAMINOPHEN 500 MG PO TABS
ORAL_TABLET | ORAL | Status: AC
  Administered 2024-04-16: 1000 mg via ORAL
  Filled 2024-04-16: qty 2

## 2024-04-16 MED ORDER — BACITRACIN ZINC 500 UNIT/GM EX OINT
TOPICAL_OINTMENT | CUTANEOUS | Status: AC
  Filled 2024-04-16: qty 28.35

## 2024-04-16 MED ORDER — BACITRACIN ZINC 500 UNIT/GM EX OINT
TOPICAL_OINTMENT | CUTANEOUS | Status: DC | PRN
  Administered 2024-04-16: 1 via TOPICAL

## 2024-04-16 MED ORDER — FLUOROMETHOLONE 0.1 % OP SUSP
2.0000 [drp] | Freq: Two times a day (BID) | OPHTHALMIC | Status: DC
Start: 2024-04-16 — End: 2024-04-17
  Filled 2024-04-16: qty 5

## 2024-04-16 MED ORDER — ROCURONIUM BROMIDE 10 MG/ML (PF) SYRINGE
PREFILLED_SYRINGE | INTRAVENOUS | Status: DC | PRN
  Administered 2024-04-16: 30 mg via INTRAVENOUS
  Administered 2024-04-16: 20 mg via INTRAVENOUS
  Administered 2024-04-16: 70 mg via INTRAVENOUS

## 2024-04-16 MED ORDER — OXYCODONE HCL 5 MG PO TABS
5.0000 mg | ORAL_TABLET | Freq: Once | ORAL | Status: AC | PRN
  Administered 2024-04-16: 5 mg via ORAL

## 2024-04-16 MED ORDER — SUGAMMADEX SODIUM 200 MG/2ML IV SOLN
INTRAVENOUS | Status: DC | PRN
  Administered 2024-04-16 (×2): 200 mg via INTRAVENOUS

## 2024-04-16 MED ORDER — CHLORHEXIDINE GLUCONATE 0.12 % MT SOLN
OROMUCOSAL | Status: AC
  Administered 2024-04-16: 15 mL via OROMUCOSAL
  Filled 2024-04-16: qty 15

## 2024-04-16 MED ORDER — LIDOCAINE 2% (20 MG/ML) 5 ML SYRINGE
INTRAMUSCULAR | Status: DC | PRN
  Administered 2024-04-16: 100 mg via INTRAVENOUS

## 2024-04-16 MED ORDER — FLEET ENEMA RE ENEM
1.0000 | ENEMA | Freq: Once | RECTAL | Status: DC | PRN
Start: 2024-04-16 — End: 2024-04-17

## 2024-04-16 MED ORDER — HYDROMORPHONE HCL 1 MG/ML IJ SOLN
0.5000 mg | INTRAMUSCULAR | Status: DC | PRN
  Administered 2024-04-16: 0.5 mg via INTRAVENOUS
  Filled 2024-04-16: qty 0.5

## 2024-04-16 SURGICAL SUPPLY — 71 items
BAG COUNTER SPONGE SURGICOUNT (BAG) ×1 IMPLANT
BAND RUBBER #18 3X1/16 STRL (MISCELLANEOUS) ×2 IMPLANT
BIT DRILL 2.4 (BIT) IMPLANT
BIT DRILL NEURO 2X3.1 SFT TUCH (MISCELLANEOUS) ×1 IMPLANT
BIT DRILL WIRE PASS 1.3MM (BIT) IMPLANT
BLADE CLIPPER SURG (BLADE) IMPLANT
BUR 14 MATCH 3 (BUR) IMPLANT
BUR 15 MATCH 2.2 (BUR) IMPLANT
BUR MATCHSTICK NEURO 3.0 LAGG (BURR) ×1 IMPLANT
BUR MR8 14 BALL 5 (BUR) IMPLANT
CANISTER SUCTION 3000ML PPV (SUCTIONS) ×1 IMPLANT
DERMABOND ADVANCED .7 DNX12 (GAUZE/BANDAGES/DRESSINGS) ×2 IMPLANT
DERMABOND ADVANCED .7 DNX6 (GAUZE/BANDAGES/DRESSINGS) IMPLANT
DRAIN JACKSON PRT FLT 7MM (DRAIN) IMPLANT
DRAPE C-ARM 42X72 X-RAY (DRAPES) ×2 IMPLANT
DRAPE C-ARMOR (DRAPES) IMPLANT
DRAPE LAPAROTOMY 100X72 PEDS (DRAPES) ×1 IMPLANT
DRAPE MICROSCOPE SLANT 54X150 (MISCELLANEOUS) IMPLANT
DRAPE SHEET LG 3/4 BI-LAMINATE (DRAPES) ×4 IMPLANT
DRAPE SURG 17X23 STRL (DRAPES) ×1 IMPLANT
DRSG OPSITE POSTOP 4X6 (GAUZE/BANDAGES/DRESSINGS) ×1 IMPLANT
DRSG OPSITE POSTOP 4X8 (GAUZE/BANDAGES/DRESSINGS) IMPLANT
DURAPREP 26ML APPLICATOR (WOUND CARE) ×1 IMPLANT
ELECT COATED BLADE 2.86 ST (ELECTRODE) ×1 IMPLANT
ELECTRODE REM PT RTRN 9FT ADLT (ELECTROSURGICAL) ×1 IMPLANT
EVACUATOR SILICONE 100CC (DRAIN) IMPLANT
FEE COVERAGE SUPPORT O-ARM (MISCELLANEOUS) ×1 IMPLANT
GAUZE 4X4 16PLY ~~LOC~~+RFID DBL (SPONGE) IMPLANT
GLOVE BIO SURGEON STRL SZ7 (GLOVE) ×4 IMPLANT
GLOVE BIOGEL PI IND STRL 7.5 (GLOVE) ×2 IMPLANT
GLOVE BIOGEL PI IND STRL 8 (GLOVE) ×2 IMPLANT
GLOVE ECLIPSE 8.0 STRL XLNG CF (GLOVE) ×1 IMPLANT
GLOVE EXAM NITRILE XL STR (GLOVE) IMPLANT
GOWN STRL REUS W/ TWL LRG LVL3 (GOWN DISPOSABLE) ×2 IMPLANT
GOWN STRL REUS W/ TWL XL LVL3 (GOWN DISPOSABLE) ×2 IMPLANT
GOWN STRL REUS W/TWL 2XL LVL3 (GOWN DISPOSABLE) IMPLANT
HEMOSTAT POWDER KIT SURGIFOAM (HEMOSTASIS) ×1 IMPLANT
KIT BASIN OR (CUSTOM PROCEDURE TRAY) ×1 IMPLANT
KIT POSITIONER JACKSON TABLE (MISCELLANEOUS) IMPLANT
KIT TURNOVER KIT B (KITS) ×1 IMPLANT
MARKER SPHERE PSV REFLC NDI (MISCELLANEOUS) ×5 IMPLANT
NDL HYPO 21X1.5 SAFETY (NEEDLE) ×1 IMPLANT
NDL HYPO 22X1.5 SAFETY MO (MISCELLANEOUS) ×1 IMPLANT
NDL SPNL 18GX3.5 QUINCKE PK (NEEDLE) IMPLANT
NEEDLE HYPO 21X1.5 SAFETY (NEEDLE) ×1 IMPLANT
NEEDLE HYPO 22X1.5 SAFETY MO (MISCELLANEOUS) ×1 IMPLANT
NEEDLE SPNL 18GX3.5 QUINCKE PK (NEEDLE) IMPLANT
PACK LAMINECTOMY NEURO (CUSTOM PROCEDURE TRAY) ×1 IMPLANT
PAD ARMBOARD POSITIONER FOAM (MISCELLANEOUS) ×3 IMPLANT
PATTIES SURGICAL .5 X3 (DISPOSABLE) IMPLANT
PIN MAYFIELD SKULL DISP (PIN) ×1 IMPLANT
PUTTY DBF 3CC CORTICAL FIBERS (Putty) IMPLANT
ROD PRECUT 3.5X80 (Rod) IMPLANT
SCREW MA INFINITY 3.5X12 (Screw) IMPLANT
SCREW MULTI AXIAL 3.5X16MM (Screw) IMPLANT
SCREW MULTI AXIAL 4.5X28 (Screw) IMPLANT
SET SCREW INFINITY IFIX THOR (Screw) IMPLANT
SOLN 0.9% NACL 1000 ML (IV SOLUTION) ×1 IMPLANT
SOLN 0.9% NACL POUR BTL 1000ML (IV SOLUTION) ×1 IMPLANT
SOLN STERILE WATER 1000 ML (IV SOLUTION) ×1 IMPLANT
SOLN STERILE WATER BTL 1000 ML (IV SOLUTION) ×1 IMPLANT
SPIKE FLUID TRANSFER (MISCELLANEOUS) ×1 IMPLANT
SPONGE SURGIFOAM ABS GEL SZ50 (HEMOSTASIS) IMPLANT
SPONGE T-LAP 4X18 ~~LOC~~+RFID (SPONGE) IMPLANT
SUT MNCRL AB 4-0 PS2 18 (SUTURE) ×1 IMPLANT
SUT VIC AB 0 CT1 18XCR BRD8 (SUTURE) ×1 IMPLANT
SUT VIC AB 2-0 CP2 18 (SUTURE) ×1 IMPLANT
SYR 20ML LL LF (SYRINGE) ×1 IMPLANT
SYR 3ML LL SCALE MARK (SYRINGE) IMPLANT
TOWEL GREEN STERILE (TOWEL DISPOSABLE) ×1 IMPLANT
TOWEL GREEN STERILE FF (TOWEL DISPOSABLE) ×1 IMPLANT

## 2024-04-16 NOTE — Anesthesia Postprocedure Evaluation (Signed)
 Anesthesia Post Note  Patient: [name]  Procedure(s) Performed: POSTERIOR CERVICAL decompression and instrumentation fusion cervical three - cervical four, cervical four - cervical five, cervical five - cervical six, cervical six - cervical seven, cervical seven - thoracic one APPLICATION OF O-ARM     Patient location during evaluation: PACU Anesthesia Type: General Level of consciousness: awake and alert Pain management: pain level controlled Vital Signs Assessment: post-procedure vital signs reviewed and stable Respiratory status: spontaneous breathing, nonlabored ventilation, respiratory function stable and patient connected to nasal cannula oxygen Cardiovascular status: blood pressure returned to baseline and stable Postop Assessment: no apparent nausea or vomiting Anesthetic complications: no   No notable events documented.  Last Vitals:  Vitals:   04/16/24 1415 04/16/24 1441  BP: 131/61 (!) 148/73  Pulse: (!) 57 (!) 57  Resp: 18 18  Temp: 36.7 C 36.6 C  SpO2: 96% 100%    Last Pain:  Vitals:   04/16/24 1526  TempSrc:   PainSc: 8                  Thom JONELLE Peoples

## 2024-04-16 NOTE — Op Note (Signed)
 PREOP DIAGNOSIS: Cervical myelopathy  POSTOP DIAGNOSIS: Cervical myelopathy   PROCEDURE: 1. Posterior arthrodesis C3-4  2.  Posterior arthrodesis, additional levels C4-5, C5-6, C6-7, C7-T1 3. Laminectomy and foraminotomies, C3-4 4. Laminectomy and foraminotomies, additional levels C4-5, C5-6, C6-7 5. Segmental instrumentation with lateral mass/pedicle screw and rod construct, C3-C4-C5-C6-C7-T1 6. Harvest of local autograft 7. Use of morselized bone allograft  8.  Use of intraoperative navigation with Stealth system and intraoperative CT   SURGEON: Dr. Dorn Ned, MD  ASSISTANT: Camie Pickle, PA.  Please note, no qualified trainees were available to assist with the procedure.  Assistance was required for retraction of the visceral structures to safely allow for instrumentation.  ANESTHESIA: General Endotracheal  EBL: 75 mL  IMPLANTS: Medtronic 3.5 x 12 mm lateral mass screws at C3, C4, C5, and C6 bilaterally 3.5 x 16 mm pedicle screws at C7 bilaterally 4.5 x 28 mm pedicle screws at T1 bilaterally 80 mm rod x 2 DBF  SPECIMENS: None  DRAINS: 7 flat JP drain  COMPLICATIONS: None immediate  CONDITION: Hemodynamically stable to PACU  HISTORY: [name] is a 73 y.o. y.o. female with history of C5-6 ACDF complicated by chronic dysphagia who developed worsening cervical myelopathy symptoms.  Imaging showed moderate to severe cervical stenosis with areas of myelomalacia.  Medical and physical therapy failed to improve her symptoms.  Risks, benefits, alternatives, and expected convalescence were discussed with the patient.  Risks discussed included but were not limited to bleeding, pain, infection, pseudoarthrosis, hardware failure, adjacent segment disease, CSF leak, neurologic deficits, weakness, numbness, paralysis, coma, and death. After all questions were answered, informed consent was obtained.  PROCEDURE IN DETAIL: The patient was brought to the operating room and  transferred to the operative table. After induction of general anesthesia, patient's head was placed in a [place] head holder and the patient was positioned prone on the operative table with all pressure points meticulously padded and secured in neutral position. The skin of the posterior neck was then prepped and draped in the usual sterile fashion.   after timeout was conducted, the skin was infiltrated with local anesthetic. Skin incision was then made sharply and Bovie electrocautery was used to dissect the subcutaneous tissue and sharply opened the fascia.  Paraspinous muscles were dissected off from C3, C4, C5, C6, C7, and T1 lamina in subperiosteal fashion.  Self-retaining retractor was used.  Spinous process clamp was then placed on T1 spinous process and an intraoperative CT was obtained with O-arm.  Using navigated drill and taps, paths for pedicle screws at C7 and T1 were made bilaterally.  Good cannulation was confirmed with ball ended probe.  Pilot holes for lateral mass screws at C3, C4, C5, and C6 bilaterally were made with high-speed drill and navigated tap was used to cannulate the lateral masses with good cannulation of bone confirmed by ball ended probe.  The inferior lamina of C3 and superior lamina of C7 were then removed with high-speed drill.  En bloc laminectomies of C4, C5, and C6 were then performed by drilling troughs at the junction of the lateral mass and lamina bilaterally at each of these levels and removing intervening ligamentum flavum.  This bone was harvested for autograft.  Additional foraminotomies were performed at C3-4, C4-5, C5-6, and C6-7.  Decompression was confirmed with easy passage.  Decortication of the facet joints and lateral masses was performed with high-speed drill bilaterally at C3, C4, C5, C6, C7, and T1.  Meticulous epidural hemostasis obtained.  Navigated screws  were then placed in the screw paths at C3, C4, C5, C6, C7, and T1 bilaterally.  The lateral masses  and transverse process were then decorticated including the joint spaces.  Rod was then placed in the tulip head and secured with screw caps and final tightened.  Second intraoperative CT confirmed excellent instrumentation placement.  The wound was irrigated thoroughly .  Autograft mixed with allograft was placed in the lateral gutters bilaterally.  A 7 flat JP drain was placed in the subfascial space and tunneled out the skin and secured with a stitch.  Exparel  mixed with Marcaine  was injected into the paraspinous muscles.  The muscle layer was closed with 0 Vicryl stitches.  The fascia was closed with 0 Vicryl stitches.  The subcutaneous layer was closed with 0 Vicryl stitches.  The dermal layer was closed with 2-0 Vicryl stitches in buried fashion.  Skin was closed with staples.  A sterile dressing was then placed patient was then removed from Mayfield head holder and flipped supine and extubated by the anesthesia service.  All counts were correct at the end of surgery.  No complications were noted.

## 2024-04-16 NOTE — Progress Notes (Signed)
 Orthopedic Tech Progress Note Patient Details:  [name] 09-Feb-1951 [id number]  Delivered [facility]   Patient ID: [name], female   DOB: 12/05/1950, 73 y.o.   MRN: [id number]  [name] Pac 04/16/2024, 4:26 PM

## 2024-04-16 NOTE — Anesthesia Procedure Notes (Signed)
 Arterial Line Insertion Start/End10/14/2025 8:21 AM, 04/16/2024 8:23 AM Performed by: Boyce Shilling, CRNA, CRNA  Patient location: Pre-op. Preanesthetic checklist: patient identified, IV checked, site marked, risks and benefits discussed, surgical consent, monitors and equipment checked, pre-op evaluation, timeout performed and anesthesia consent Lidocaine  1% used for infiltration radial was placed Catheter size: 20 G Hand hygiene performed  and maximum sterile barriers used   Attempts: 1 Following insertion, dressing applied. Post procedure assessment: normal and unchanged

## 2024-04-16 NOTE — Anesthesia Procedure Notes (Signed)
 Procedure Name: Intubation Date/Time: 04/16/2024 8:23 AM  Performed by: Hedy Jarred, CRNAPre-anesthesia Checklist: Patient identified, Emergency Drugs available, Suction available and Patient being monitored Patient Re-evaluated:Patient Re-evaluated prior to induction Oxygen Delivery Method: Circle System Utilized Preoxygenation: Pre-oxygenation with 100% oxygen Induction Type: IV induction Ventilation: Mask ventilation without difficulty and Oral airway inserted - appropriate to patient size Laryngoscope Size: Glidescope and 3 Grade View: Grade I Tube type: Oral Tube size: 7.0 mm Number of attempts: 1 Airway Equipment and Method: Stylet and Oral airway Placement Confirmation: ETT inserted through vocal cords under direct vision, positive ETCO2 and breath sounds checked- equal and bilateral Secured at: 22 cm Tube secured with: Tape Dental Injury: Teeth and Oropharynx as per pre-operative assessment  Comments: Neutral neck alignment maintained throughout.

## 2024-04-16 NOTE — H&P (Signed)
 CC: myelopathy  HPI:     Patient is a 73 y.o. female with hx of ACDF who developed worsening myelopathy symptoms, found to have severe cervical stenosis.  Medical and physical therapy did not improve her symptoms.   She has underlying dysphagia after her previous ACDF so PCDF was discussed.    Patient Active Problem List   Diagnosis Date Noted   Cervical myelopathy (HCC) 12/18/2023   Moderate persistent asthma with allergic rhinitis without status asthmaticus with acute exacerbation 11/25/2023   Hypokalemia 11/25/2023   Abdominal bloating 10/09/2023   Abnormal findings on diagnostic imaging of other parts of digestive tract 10/09/2023   Change in bowel habit 10/09/2023   Colon cancer screening 10/09/2023   Diarrhea 10/09/2023   Irritable bowel syndrome with diarrhea 10/09/2023   Diverticula of intestine 10/09/2023   Diverticular disease of colon 10/09/2023   Diverticulosis of colon 10/09/2023   Diverticulosis of large intestine without perforation or abscess without bleeding 10/09/2023   Flatulence, eructation and gas pain 10/09/2023   Gastro-esophageal reflux disease with esophagitis 10/09/2023   Incontinence of feces 10/09/2023   Nausea 10/09/2023   Left lower quadrant abdominal pain 10/09/2023   Iron deficiency anemia 08/04/2023   Nuclear senile cataract of both eyes 07/21/2023   Choking 01/31/2023   Chronic kidney disease, stage 2 (mild) 08/23/2022   Mild intermittent asthma without complication 08/23/2022   Moderate persistent asthma with exacerbation 08/23/2022   Thrombocytopenia 08/23/2022   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   Allergic rhinitis 08/15/2022   Conjunctival lesion 07/11/2022   Pure hypercholesterolemia 05/31/2022   Acute sinusitis 02/10/2022   Bacterial conjunctivitis of right eye 01/20/2022   Exertional dyspnea 08/24/2021   Carotid stenosis 10/29/2020   Gait disorder 10/29/2020   Near syncope 08/30/2019   Chronic heart failure with  preserved ejection fraction (HFpEF, >= 50%) (HCC) 08/30/2019   Vaginal dryness 08/24/2018   S/P shoulder replacement, left 08/16/2018   Arthrosis of left shoulder 05/02/2018   Chest pain 05/01/2018   Asthma 05/01/2018   Essential hypertension 06/02/2017   Degenerative arthritis of knee, bilateral 02/22/2016   Patellar contusion 02/03/2016   Patellofemoral arthritis 10/22/2015   Radiculopathy 12/11/2014   Malignant lymphoma, 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   Lupus erythematosus 07/13/2011   Class 1 obesity 05/14/2007   GERD 05/14/2007   Arthritis 05/14/2007   Past Medical History:  Diagnosis Date   Acid reflux    tx protonix    Ambulates with cane    straight  w/ four prongs   Arthritis    Asthma    has inhalers   Carotid stenosis 10/29/2020   Cataracts, bilateral    in [place]   Chronic diastolic heart failure (HCC) 08/30/2019   Chronic kidney disease    kidney failure 03/2007 - tx with dialysis x 3 - kidney functioned returned back to normal per pt on 04/11/24   Conjunctival amyloidosis (HCC)    right eye, 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   Diastolic heart failure (HCC) 2019   Gait instability 10/29/2020   uses cane   Headache    no current problems as of 04/11/24   HSV infection    Hypertension    Iron deficiency anemia 08/04/2023   hx iron infursions   Lupus 07/13/2011   Malignant lymphoma, 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 2022 - right   MASS  EXCISION     back   MRI     x several in [place]   REPLACEMENT TOTAL HIP W/  RESURFACING IMPLANTS Left 09/2022   rortator     rotator cuff   TOTAL SHOULDER ARTHROPLASTY Left 08/16/2018   Procedure: LEFT SHOULDER REVERSE TOTAL SHOULDER ARTHROPLASTY AND BICEPS TENOTOMY;  Surgeon: Dozier Soulier, MD;  Location: [facility] OR;  Service: Orthopedics;  Laterality: Left;    Medications Prior to Admission  Medication Sig Dispense Refill Last Dose/Taking   acetaminophen  (TYLENOL ) 500 MG tablet Take 1,000 mg by mouth every 6 (six) hours as needed for mild pain or headache.   04/15/2024   albuterol  (VENTOLIN  HFA) 108 (90 Base) MCG/ACT inhaler Inhale 2 puffs into the lungs every 6 (six) hours as needed for wheezing or shortness of breath. 8 g 6 Past Month   aspirin  EC 81 MG tablet Take 81 mg by mouth at bedtime.   04/07/2024   fluorometholone (EFLONE) 0.1 % ophthalmic suspension Place 2 drops into the right eye 2 (two) times daily.   04/16/2024 Morning   Fluticasone -Umeclidin-Vilant (TRELEGY ELLIPTA ) 200-62.5-25 MCG/ACT AEPB Inhale 1 puff into the lungs daily. 1 each 5 04/16/2024 Morning   guaiFENesin -dextromethorphan  (ROBITUSSIN DM) 100-10 MG/5ML syrup Take 5-10 mLs by mouth every 8 (eight) hours as needed for cough.   Past Week   hydroxychloroquine  (PLAQUENIL ) 200 MG tablet Take 200 mg by mouth every evening.   04/15/2024   latanoprost  (XALATAN ) 0.005 % ophthalmic solution Place 1 drop into both eyes at bedtime.   04/15/2024   losartan  (COZAAR ) 50 MG tablet Take 50 mg by mouth at bedtime.   04/15/2024   montelukast  (SINGULAIR ) 10 MG tablet Take 1 tablet (10 mg total) by mouth at bedtime. 30 tablet 11 04/15/2024   pantoprazole  (PROTONIX ) 40 MG tablet Take 40 mg by mouth daily before breakfast.   04/15/2024   Polyethyl Glycol-Propyl Glycol (SYSTANE) 0.4-0.3 % SOLN Apply to eye.   04/15/2024   potassium chloride  SA (KLOR-CON  M) 20 MEQ tablet Take 1 tablet (20 mEq total) by mouth 2 (two) times daily. 2 in the morning  and 1 in the evening (Patient taking differently: Take 20-40 mEq by mouth See admin instructions. Take 2 tablets (40 meq) by mouth in the morning and  take 1 tablet (20 meq) by mouth in the evening) 60 tablet 11 04/15/2024   Probiotic, Lactobacillus, CAPS Take 1 capsule by mouth at bedtime.   Past Week   pyridoxine (B-6) 100 MG tablet Take 100 mg by mouth in the morning.   Past Week   rosuvastatin  (CRESTOR ) 20 MG tablet TAKE 1 TABLET BY MOUTH DAILY (Patient taking differently: Take 20 mg by mouth at bedtime.) 90 tablet 2 04/15/2024   spironolactone  (ALDACTONE ) 25 MG tablet Take 0.5 tablets (12.5 mg total) by mouth daily. 30 tablet 11 04/15/2024   torsemide  (DEMADEX ) 20 MG tablet Take 3 tablets by mouth once daily 270 tablet 2 04/15/2024   traZODone  (DESYREL ) 50 MG tablet TAKE 1 TABLET  BY MOUTH AT BEDTIME AS NEEDED FOR SLEEP 30 tablet 0 Past Week   Turmeric 500 MG CAPS Take 500 mg by mouth in the morning.   Past Week   VITAMIN D  PO Take 2,000 Units by mouth every evening.   Past Week   valACYclovir  (VALTREX ) 1000 MG tablet Take 1,000 mg by mouth 2 (two) times daily as needed (breakouts).   More than a month   Allergies  Allergen Reactions   Iodinated Contrast Media Other (See Comments)    Kidney failure.  - Patient states she has no trouble with topical iodine  agents such as betadine    Erythromycin  Base Itching    Red streaks on arms   Other Cough    Smells of perfume, body wash, lotion, gas, and paint.  Wheezing   anesthesia -nausea/vomiting   Ciprofloxacin Itching and Rash   Oxycodone Other (See Comments)    Hallucination.    Social History   Tobacco Use   Smoking status: Former    Current packs/day: 0.00    Average packs/day: 1 pack/day for 10.0 years (10.0 ttl pk-yrs)    Types: Cigarettes    Start date: 06/17/1984    Quit date: 06/17/1994    Years since quitting: 29.8    Passive exposure: Past   Smokeless tobacco: Never  Substance Use Topics   Alcohol  use: Yes    Comment:  occasional beer    Family History  Problem Relation Age of Onset   Lung disease Father    Coronary artery disease Brother    Breast cancer Neg Hx      Review of Systems Pertinent items are noted in HPI.  Objective:   Patient Vitals for the past 8 hrs:  BP Temp Temp src Pulse Resp SpO2 Height Weight  04/16/24 0611 (!) 123/58 98.6 F (37 C) Oral 65 17 97 % 5' 6 (1.676 m) 83.5 kg   No intake/output data recorded. No intake/output data recorded.      General : Alert, cooperative, no distress, appears stated age   Head:  Normocephalic/atraumatic    Eyes: PERRL, conjunctiva/corneas clear, EOM's intact. Fundi could not be visualized Neck: Supple. Anterior neck incision well-healed Chest:  Respirations unlabored Chest wall: no tenderness or deformity Heart: Regular rate and rhythm Abdomen: Soft, nontender and nondistended Extremities: warm and well-perfused Skin: normal turgor, color and texture Neurologic:  Alert, oriented x 3.  Eyes open spontaneously. PERRL, EOMI, VFC, no facial droop. V1-3 intact.  No dysarthria, tongue protrusion symmetric.  CNII-XII intact. Normal strength, sensation, 3+ reflexes.  No pronator drift, full strength in legs       Data ReviewCBC:  Lab Results  Component Value Date   WBC 6.4 04/08/2024   WBC 10.9 (H) 11/29/2023   RBC 3.68 (L) 04/08/2024   RBC 3.65 (L) 04/08/2024   BMP:  Lab Results  Component Value Date   GLUCOSE 108 (H) 04/08/2024   GLUCOSE 90 05/18/2015   GLUCOSE 103 01/15/2013   CHLORIDE 108 05/18/2015   CO2 27 04/08/2024   CO2 26 05/18/2015   BUN 13 04/08/2024   BUN 12 04/25/2023   BUN 12.6 05/18/2015   CREATININE 1.05 (H) 04/08/2024   CREATININE 0.8 05/18/2015   CALCIUM  9.3 04/08/2024   CALCIUM  9.1 05/18/2015   Radiology review:  See clinic note  Assessment:   Active Problems:   * No active hospital problems. *  Cervical stenosis with myelopathy Plan:   - plan for C3-T1 PCDF - Risks, benefits, alternatives,  and  expected convalescence were discussed with her.  Risks discussed included, but were not limited to bleeding, pain, infection, scar, spinal fluid leak, neurologic deficit, instability, pseudoarthrosis, damage to nearby organs, and death.  Informed consent was obtained.

## 2024-04-16 NOTE — Transfer of Care (Signed)
 Immediate Anesthesia Transfer of Care Note  Patient: [name]  Procedure(s) Performed: POSTERIOR CERVICAL decompression and instrumentation fusion cervical three - cervical four, cervical four - cervical five, cervical five - cervical six, cervical six - cervical seven, cervical seven - thoracic one APPLICATION OF O-ARM  Patient Location: PACU  Anesthesia Type:General  Level of Consciousness: drowsy  Airway & Oxygen Therapy: Patient Spontanous Breathing and Patient connected to face mask oxygen  Post-op Assessment: Report given to RN and Post -op Vital signs reviewed and stable  Post vital signs: Reviewed and stable  Last Vitals:  Vitals Value Taken Time  BP 121/61 04/16/24 12:30  Temp 36.7 C 04/16/24 12:21  Pulse 55 04/16/24 12:45  Resp 9 04/16/24 12:45  SpO2 98 % 04/16/24 12:45  Vitals shown include unfiled device data.  Last Pain:  Vitals:   04/16/24 1232  TempSrc:   PainSc: 8       Patients Stated Pain Goal: 3 (04/16/24 0703)  Complications: No notable events documented.

## 2024-04-17 ENCOUNTER — Encounter (HOSPITAL_COMMUNITY): Payer: Self-pay | Admitting: Neurosurgery

## 2024-04-17 MED ORDER — DOCUSATE SODIUM 100 MG PO CAPS: 100.0000 mg | ORAL_CAPSULE | Freq: Two times a day (BID) | ORAL | 0 refills | Status: AC | PRN

## 2024-04-17 MED ORDER — HYDROCODONE-ACETAMINOPHEN 5-325 MG PO TABS: 1.0000 | ORAL_TABLET | ORAL | 0 refills | Status: AC | PRN

## 2024-04-17 MED ORDER — METHOCARBAMOL 500 MG PO TABS: 500.0000 mg | ORAL_TABLET | Freq: Four times a day (QID) | ORAL | 2 refills | Status: AC | PRN

## 2024-04-17 NOTE — Discharge Summary (Signed)
 Patient ID: TEMPLE EWART MRN: [id number] DOB/AGE: 1950-12-16 73 y.o.  Admit date: 04/16/2024 Discharge date: 04/17/2024  Admission Diagnoses: Cervical spondylosis with myelopathy [M47.12] Cervical spinal stenosis [M48.02]   Discharge Diagnoses: Same   Discharged Condition: Stable  Hospital Course:  [name] is a 73 y.o. female who was admitted following an uncomplicated PCDF C3-T1. They were recovered in PACU and transferred to the floor. Hospital course was uncomplicated. Pt stable for discharge today. Pt to f/u in office for routine post op visit. Pt is in agreement w/ plan.    Discharge Exam: Blood pressure (!) 153/63, pulse 70, temperature 98.8 F (37.1 C), temperature source Oral, resp. rate 18, height 5' 6 (1.676 m), weight 83.5 kg, SpO2 100%.   Disposition: Discharge disposition: 01-Home or Self Care       Discharge Instructions     If the dressing is still on your incision site when you go home, remove it on the third day after your surgery date. Remove dressing if it begins to fall off, or if it is dirty or damaged before the third day.   Complete by: As directed    Incentive spirometry RT   Complete by: As directed       Allergies as of 04/17/2024       Reactions   Iodinated Contrast Media Other (See Comments)   Kidney failure.  - Patient states she has no trouble with topical iodine  agents such as betadine    Erythromycin  Base Itching   Red streaks on arms   Other Cough   Smells of perfume, body wash, lotion, gas, and paint.  Wheezing  anesthesia -nausea/vomiting   Ciprofloxacin Itching, Rash   Oxycodone Other (See Comments)   Hallucination.        Medication List     PAUSE taking these medications    aspirin  EC 81 MG tablet Wait to take this until: April 23, 2024 Take 81 mg by mouth at bedtime.   hydroxychloroquine  200 MG tablet Wait to take this until: April 30, 2024 Commonly known as: PLAQUENIL  Take 200 mg by mouth  every evening.       TAKE these medications    acetaminophen  500 MG tablet Commonly known as: TYLENOL  Take 1,000 mg by mouth every 6 (six) hours as needed for mild pain or headache.   albuterol  108 (90 Base) MCG/ACT inhaler Commonly known as: VENTOLIN  HFA Inhale 2 puffs into the lungs every 6 (six) hours as needed for wheezing or shortness of breath.   docusate sodium  100 MG capsule Commonly known as: COLACE Take 1 capsule (100 mg total) by mouth 2 (two) times daily as needed for mild constipation.   fluorometholone 0.1 % ophthalmic suspension Commonly known as: EFLONE Place 2 drops into the right eye 2 (two) times daily.   guaiFENesin -dextromethorphan  100-10 MG/5ML syrup Commonly known as: ROBITUSSIN DM Take 5-10 mLs by mouth every 8 (eight) hours as needed for cough.   HYDROcodone -acetaminophen  5-325 MG tablet Commonly known as: NORCO/VICODIN Take 1 tablet by mouth every 4 (four) hours as needed for moderate pain (pain score 4-6).   latanoprost  0.005 % ophthalmic solution Commonly known as: XALATAN  Place 1 drop into both eyes at bedtime.   losartan  50 MG tablet Commonly known as: COZAAR  Take 50 mg by mouth at bedtime.   methocarbamol 500 MG tablet Commonly known as: ROBAXIN Take 1 tablet (500 mg total) by mouth every 6 (six) hours as needed for muscle spasms.   montelukast  10 MG tablet  Commonly known as: SINGULAIR  Take 1 tablet (10 mg total) by mouth at bedtime.   pantoprazole  40 MG tablet Commonly known as: PROTONIX  Take 40 mg by mouth daily before breakfast.   potassium chloride  SA 20 MEQ tablet Commonly known as: KLOR-CON  M Take 1 tablet (20 mEq total) by mouth 2 (two) times daily. 2 in the morning and 1 in the evening What changed:  how much to take when to take this additional instructions   Probiotic (Lactobacillus) Caps Take 1 capsule by mouth at bedtime.   pyridoxine 100 MG tablet Commonly known as: B-6 Take 100 mg by mouth in the morning.    rosuvastatin  20 MG tablet Commonly known as: CRESTOR  TAKE 1 TABLET BY MOUTH DAILY What changed: when to take this   spironolactone  25 MG tablet Commonly known as: ALDACTONE  Take 0.5 tablets (12.5 mg total) by mouth daily.   Systane 0.4-0.3 % Soln Generic drug: Polyethyl Glycol-Propyl Glycol Apply to eye.   torsemide  20 MG tablet Commonly known as: DEMADEX  Take 3 tablets by mouth once daily   traZODone  50 MG tablet Commonly known as: DESYREL  TAKE 1 TABLET BY MOUTH AT BEDTIME AS NEEDED FOR SLEEP   Trelegy Ellipta  200-62.5-25 MCG/ACT Aepb Generic drug: Fluticasone -Umeclidin-Vilant Inhale 1 puff into the lungs daily.   Turmeric 500 MG Caps Take 500 mg by mouth in the morning.   valACYclovir  1000 MG tablet Commonly known as: VALTREX  Take 1,000 mg by mouth 2 (two) times daily as needed (breakouts).   VITAMIN D  PO Take 2,000 Units by mouth every evening.               Discharge Care Instructions  (From admission, onward)           Start     Ordered   04/17/24 0000  If the dressing is still on your incision site when you go home, remove it on the third day after your surgery date. Remove dressing if it begins to fall off, or if it is dirty or damaged before the third day.        04/17/24 1245             Signed: CAMIE CAYLIN Dontae Minerva 04/17/2024, 12:45 PM

## 2024-04-17 NOTE — Progress Notes (Signed)
 Patient alert and oriented, voided, ambulate, surgical site clean and dry no sign of infection. D/c instructions explain and given, all questions answered. Patient d/c home with 3 in 1 per order.

## 2024-04-17 NOTE — Evaluation (Signed)
 Physical Therapy Brief Evaluation and Discharge Note Patient Details Name: [name] MRN: [id number] DOB: 1951-04-24 Today's Date: 04/17/2024   History of Present Illness  Pt is a 73 y/o female presenting on 10/14 for same day C3-T1 PCDF. PMH includes: arthritis, bil cataracts, CKD, PVD, CVA with L sided weakness, L total shoulder arthroplasty, prior ACDF, dysphagia.  Clinical Impression  PTA, pt lives with her spouse and is independent. Pt reports 5/10 surgical site pain and improved R hand and bilateral foot numbness. Overall, she is mobilizing well. Ambulating household distances with a RW and negotiated 3 steps to simulate home entrance. Reviewed cervical precautions, brace use, and DME. No further acute PT needs. Thank you for this consult.     PT Assessment Patient does not need any further PT services  Assistance Needed at Discharge  PRN    Equipment Recommendations None recommended by PT  Recommendations for Other Services       Precautions/Restrictions Precautions Precautions: Fall;Cervical Precaution Booklet Issued: Yes (comment) Recall of Precautions/Restrictions: Intact Precaution/Restrictions Comments: drain Required Braces or Orthoses: Cervical Brace Cervical Brace: Hard collar;At all times Restrictions Weight Bearing Restrictions Per Provider Order: No        Mobility  Bed Mobility Rolling: Modified independent (Device/Increase time) Supine/Sidelying to sit: Modified independent (Device/Increased time)      Transfers Overall transfer level: Modified independent Equipment used: None                    Ambulation/Gait Ambulation/Gait assistance: Modified independent (Device/Increase time) Gait Distance (Feet): 150 Feet Assistive device: Rolling walker (2 wheels) Gait Pattern/deviations: Step-through pattern, Decreased stride length Gait Speed: Below normal General Gait Details: Good posture throughout, slower pace, steady  Home  Activity Instructions    Stairs Stairs: Yes Stairs assistance: Contact guard assist Stair Management: One rail Right Number of Stairs: 3 General stair comments: Verbal cues for feeling for step, and step by step pattern  Modified Rankin (Stroke Patients Only)        Balance Overall balance assessment: Needs assistance Sitting-balance support: No upper extremity supported, Feet supported Sitting balance-Leahy Scale: Good     Standing balance support: No upper extremity supported, During functional activity Standing balance-Leahy Scale: Fair            Pertinent Vitals/Pain PT - Brief Vital Signs All Vital Signs Stable: Yes Pain Assessment Pain Assessment: Faces Faces Pain Scale: Hurts a little bit Pain Location: neck, surgical site Pain Descriptors / Indicators: Discomfort, Operative site guarding Pain Intervention(s): Monitored during session     Home Living Family/patient expects to be discharged to:: Private residence Living Arrangements: Spouse/significant other Available Help at Discharge: Family;Available 24 hours/day Home Environment: Stairs to enter  [employer] of Steps: 3 Home Equipment: Agricultural consultant (2 wheels)        Prior Function Level of Independence: Independent      UE/LE Assessment   UE ROM/Strength/Tone/Coordination: WFL    LE ROM/Strength/Tone/Coordination: [facility]      Communication   Communication Communication: No apparent difficulties     Cognition Overall Cognitive Status: Appears within functional limits for tasks assessed/performed       General Comments      Exercises     Assessment/Plan    PT Problem List         PT Visit Diagnosis Pain    No Skilled PT All education completed;Patient will have necessary level of assist by caregiver at discharge;Patient is modified independent with all activity/mobility   Co-evaluation  AMPAC 6 Clicks Help needed turning from your back to your  side while in a flat bed without using bedrails?: None Help needed moving from lying on your back to sitting on the side of a flat bed without using bedrails?: None Help needed moving to and from a bed to a chair (including a wheelchair)?: None Help needed standing up from a chair using your arms (e.g., wheelchair or bedside chair)?: None Help needed to walk in hospital room?: None Help needed climbing 3-5 steps with a railing? : A Little 6 Click Score: 23      End of Session Equipment Utilized During Treatment: Cervical collar Activity Tolerance: Patient tolerated treatment well Patient left: in bed;with call bell/phone within reach;Other (comment) (with OT) Nurse Communication: Mobility status PT Visit Diagnosis: Pain Pain - part of body:  (neck)     Time: 9141-9079 PT Time Calculation (min) (ACUTE ONLY): 22 min  Charges:   PT Evaluation $PT Eval Low Complexity: 1 Low      Aleck Daring, PT, DPT Acute Rehabilitation Services Office [phone]   Aleck ONEIDA Daring  04/17/2024, 11:01 AM

## 2024-04-17 NOTE — Evaluation (Signed)
 Occupational Therapy Evaluation Patient Details Name: [name] MRN: [id number] DOB: 1951/06/29 Today's Date: 04/17/2024   History of Present Illness   Pt is a 73 y/o female presenting on 10/14 for same day C3-T1 PCDF. PMH includes: arthritis, bil cataracts, CKD, PVD, CVA with L sided weakness, L total shoulder arthroplasty, prior ACDF, dysphagia.     Clinical Impressions Patient admitted for above and presents with problem list below.  PTA pt was independent and driving. Patient was educated on cervical mgmt and wear schedule, cervical precautions, ADL compensatory techniques, AE/DME, mobility progression, safety and recommendations.  Today, pt demonstrated ability to complete bed mobility with contact guard assist, transfers using RW with contact guard assist, functional mobility using RW with contact guard assist, and ADLs with up to contact guard assist.  At discharge, pt will have support from spouse and granddaughter as needed.  Based on performance today, will follow acutely to optimize independence and safety with ADLs and mobility.  OT will follow.       If plan is discharge home, recommend the following:   A little help with walking and/or transfers;A little help with bathing/dressing/bathroom;Assistance with cooking/housework;Assist for transportation;Help with stairs or ramp for entrance     Functional Status Assessment   Patient has had a recent decline in their functional status and demonstrates the ability to make significant improvements in function in a reasonable and predictable amount of time.     Equipment Recommendations   BSC/3in1     Recommendations for Other Services         Precautions/Restrictions   Precautions Precautions: Fall;Cervical Precaution Booklet Issued: Yes (comment) Recall of Precautions/Restrictions: Intact Precaution/Restrictions Comments: drain Required Braces or Orthoses: Cervical Brace Cervical Brace: Hard collar;At  all times Restrictions Weight Bearing Restrictions Per Provider Order: No     Mobility Bed Mobility Overal bed mobility: Needs Assistance Bed Mobility: Sit to Sidelying, Rolling Rolling: Contact guard assist       Sit to sidelying: Contact guard assist General bed mobility comments: cueing for technique    Transfers Overall transfer level: Needs assistance Equipment used: Rolling walker (2 wheels) Transfers: Sit to/from Stand Sit to Stand: Contact guard assist           General transfer comment: cueing for hand placement and posture      Balance Overall balance assessment: Needs assistance Sitting-balance support: No upper extremity supported, Feet supported Sitting balance-Leahy Scale: Good     Standing balance support: Bilateral upper extremity supported, Single extremity supported, No upper extremity supported, During functional activity Standing balance-Leahy Scale: Poor Standing balance comment: relies on BUE support, preference to 1 UE during ADLs                           ADL either performed or assessed with clinical judgement   ADL Overall ADL's : Needs assistance/impaired     Grooming: Contact guard assist;Standing           Upper Body Dressing : Set up;Sitting   Lower Body Dressing: Contact guard assist;Sit to/from stand Lower Body Dressing Details (indicate cue type and reason): able to complete figure 4 with increased effort, contact guard in standing. preference for 1 UE support Toilet Transfer: Contact guard assist;Ambulation;Rolling walker (2 wheels)           Functional mobility during ADLs: Contact guard assist;Rolling walker (2 wheels)       Vision   Vision Assessment?: No apparent visual deficits  Perception         Praxis         Pertinent Vitals/Pain Pain Assessment Pain Assessment: Faces Faces Pain Scale: Hurts a little bit Pain Location: neck, surgical site Pain Descriptors / Indicators:  Discomfort, Operative site guarding Pain Intervention(s): Limited activity within patient's tolerance, Monitored during session, Repositioned     Extremity/Trunk Assessment Upper Extremity Assessment Upper Extremity Assessment: Generalized weakness (within cervical precautions)   Lower Extremity Assessment Lower Extremity Assessment: Defer to PT evaluation   Cervical / Trunk Assessment Cervical / Trunk Assessment: Neck Surgery   Communication Communication Communication: No apparent difficulties   Cognition Arousal: Alert Behavior During Therapy: WFL for tasks assessed/performed Cognition: No apparent impairments                               Following commands: Intact       Cueing  General Comments   Cueing Techniques: Verbal cues      Exercises     Shoulder Instructions      Home Living Family/patient expects to be discharged to:: Private residence Living Arrangements: Spouse/significant other Available Help at Discharge: Family Type of Home: House       Home Layout: One level     Bathroom Shower/Tub: Walk-in Human resources officer: Standard     Home Equipment: Agricultural consultant (2 wheels)          Prior Functioning/Environment Prior Level of Function : Independent/Modified Independent;Driving                    OT Problem List: Decreased strength;Decreased activity tolerance;Decreased knowledge of use of DME or AE;Decreased knowledge of precautions;Pain   OT Treatment/Interventions: Self-care/ADL training;DME and/or AE instruction;Therapeutic activities;Patient/family education;Balance training      OT Goals(Current goals can be found in the care plan section)   Acute Rehab OT Goals Patient Stated Goal: home OT Goal Formulation: With patient Time For Goal Achievement: 05/01/24 Potential to Achieve Goals: Good   OT Frequency:  Min 2X/week    Co-evaluation              AM-PAC OT 6 Clicks Daily Activity      Outcome Measure Help from another person eating meals?: None Help from another person taking care of personal grooming?: A Little Help from another person toileting, which includes using toliet, bedpan, or urinal?: A Little Help from another person bathing (including washing, rinsing, drying)?: A Little Help from another person to put on and taking off regular upper body clothing?: A Little Help from another person to put on and taking off regular lower body clothing?: A Little 6 Click Score: 19   End of Session Equipment Utilized During Treatment: Rolling walker (2 wheels);Cervical collar Nurse Communication: Mobility status  Activity Tolerance: Patient tolerated treatment well Patient left: in bed;with call bell/phone within reach  OT Visit Diagnosis: Other abnormalities of gait and mobility (R26.89);Muscle weakness (generalized) (M62.81);Pain Pain - part of body:  (cervical incision)                Time: 9082-9059 OT Time Calculation (min): 23 min Charges:  OT General Charges $OT Visit: 1 Visit OT Evaluation $OT Eval Moderate Complexity: 1 Mod OT Treatments $Self Care/Home Management : 8-22 mins  Etta NOVAK, OT Acute Rehabilitation Services Office [phone] Secure Chat Preferred    Etta GORMAN Hope 04/17/2024, 10:48 AM

## 2024-04-17 NOTE — Progress Notes (Addendum)
    Providing Compassionate, Quality Care - Together   NEUROSURGERY PROGRESS NOTE     S: No issues overnight.    O: EXAM:  BP (!) 153/63 (BP Location: Left Arm)   Pulse 70   Temp 98.8 F (37.1 C) (Oral)   Resp 18   Ht 5' 6 (1.676 m)   Wt 83.5 kg   SpO2 100%   BMI 29.70 kg/m     Awake, alert, oriented  Speech fluent, appropriate  Ambulating in [place] w/ walker without difficulty JP in place, 100cc o/n Collar in place   ASSESSMENT:  73 y.o. s/p PCDF C3-T1, POD#1    PLAN: -Therapies as tolerated -Continue JP - consider dc this PM if output decreases -Continue supportive care -Call w/ questions/concerns.   Camie Pickle, [facility]

## 2024-04-18 ENCOUNTER — Telehealth: Payer: Self-pay

## 2024-04-18 NOTE — Transitions of Care (Post Inpatient/ED Visit) (Signed)
   04/18/2024  Name: [name] MRN: [id number] DOB: 08-Nov-1950  Today's TOC FU Call Status: Today's TOC FU Call Status:: Unsuccessful Call (1st Attempt) Unsuccessful Call (1st Attempt) Date: 04/18/24  Attempted to reach the patient regarding the most recent Inpatient/ED visit.  Follow Up Plan: Additional outreach attempts will be made to reach the patient to complete the Transitions of Care (Post Inpatient/ED visit) call.   Lorie Melichar J. Marithza Malachi RN, MSN [facility], [facility] RN Care Manager Direct Dial: [phone]  Fax: [phone] Website: [email or web address]

## 2024-04-20 LAB — BPAM RBC
Blood Product Expiration Date: 202511062359
Blood Product Expiration Date: 202511062359
Unit Type and Rh: 5100
Unit Type and Rh: 5100

## 2024-04-20 LAB — TYPE AND SCREEN
ABO/RH(D): B POS
Antibody Screen: POSITIVE
DAT, IgG: NEGATIVE
Unit division: 0
Unit division: 0

## 2024-04-22 ENCOUNTER — Telehealth: Payer: Self-pay

## 2024-04-22 NOTE — Patient Instructions (Addendum)
 Visit Information  Thank you for taking time to visit with me today.   Stay active Take pain medications as needed Follow up with surgeon as scheduled Monitor for swelling, redness, pain, pus, and fever Keep incision clean and dry.   Notify physician immediately for any changes   When to seek medical care: Loss of bowel or bladder control Fever Unexpected weight loss Pain following a fall or other injury Severe or constant pain that is not improved by rest Pain that spreads down one or both legs, especially if it goes below the knee Numbness, tingling, or weakness in your legs     Patient verbalizes understanding of instructions and care plan provided today and agrees to view in MyChart. Active MyChart status and patient understanding of how to access instructions and care plan via MyChart confirmed with patient.     The patient has been provided with contact information for the care management team and has been advised to call with any health related questions or concerns.   Please call the care guide team at [phone] if you need to cancel or reschedule your appointment.   Please call the Suicide and Crisis Lifeline: 988 if you are experiencing a Mental Health or Behavioral Health Crisis or need someone to talk to.  [name] J. Tahja Liao RN, MSN [facility], [facility] RN Care Manager Direct Dial: [phone]  Fax: [phone] Website: [email or web address]

## 2024-04-22 NOTE — Transitions of Care (Post Inpatient/ED Visit) (Signed)
 04/22/2024  Name: [name] MRN: [id number] DOB: 1950-08-02  Today's TOC FU Call Status: Today's TOC FU Call Status:: Successful TOC FU Call Completed TOC FU Call Complete Date: 04/22/24 Patient's Name and Date of Birth confirmed.  Transition Care Management Follow-up Telephone Call Date of Discharge: 04/17/24 Discharge Facility: [facility]) Type of Discharge: Inpatient Admission Primary Inpatient Discharge Diagnosis:: Cervical spinal stenosis How have you been since you were released from the hospital?: Better  Items Reviewed: Did you receive and understand the discharge instructions provided?: Yes Medications obtained,verified, and reconciled?: Yes (Medications Reviewed) Any new allergies since your discharge?: No Dietary orders reviewed?: Yes Type of Diet Ordered:: Low sodium heart healthy Do you have support at home?: Yes People in Home [RPT]: spouse Name of Support/Comfort Primary Source: Rickie  Medications Reviewed Today: Medications Reviewed Today     Reviewed by Kimmberly Wisser, RN (Case Manager) on 04/22/24 at 1344  Med List Status: <None>   Medication Order Taking? Sig Documenting Provider Last Dose Status Informant  acetaminophen  (TYLENOL ) 500 MG tablet [id number] Yes Take 1,000 mg by mouth every 6 (six) hours as needed for mild pain or headache. [provider]  Active Self  albuterol  (VENTOLIN  HFA) 108 (90 Base) MCG/ACT inhaler [id number] Yes Inhale 2 puffs into the lungs every 6 (six) hours as needed for wheezing or shortness of breath. Parrett, Madelin RAMAN, NP  Active Self  aspirin  EC 81 MG tablet [id number]  Take 81 mg by mouth at bedtime. [provider]  Active Self  docusate sodium  (COLACE) 100 MG capsule [id number] Yes Take 1 capsule (100 mg total) by mouth 2 (two) times daily as needed for mild constipation. Tomlinson, Sara Caylin, PA-C  Active   fluorometholone (EFLONE) 0.1 % ophthalmic suspension [id number] Yes Place 2 drops into the  right eye 2 (two) times daily. [provider]  Active Self  Fluticasone -Umeclidin-Vilant (TRELEGY ELLIPTA ) 200-62.5-25 MCG/ACT AEPB [id number] Yes Inhale 1 puff into the lungs daily. Parrett, Madelin RAMAN, NP  Active Self  guaiFENesin -dextromethorphan  (ROBITUSSIN DM) 100-10 MG/5ML syrup [id number] Yes Take 5-10 mLs by mouth every 8 (eight) hours as needed for cough. [provider]  Active Self  HYDROcodone -acetaminophen  (NORCO/VICODIN) 5-325 MG tablet [id number] Yes Take 1 tablet by mouth every 4 (four) hours as needed for moderate pain (pain score 4-6). Johnanna Credit Caylin, PA-C  Active   hydroxychloroquine  (PLAQUENIL ) 200 MG tablet [id number]  Take 200 mg by mouth every evening. [provider]  Active Self  latanoprost  (XALATAN ) 0.005 % ophthalmic solution [id number] Yes Place 1 drop into both eyes at bedtime. [provider]  Active Self  losartan  (COZAAR ) 50 MG tablet [id number] Yes Take 50 mg by mouth at bedtime. [provider]  Active Self  methocarbamol (ROBAXIN) 500 MG tablet [id number] Yes Take 1 tablet (500 mg total) by mouth every 6 (six) hours as needed for muscle spasms. Johnanna Credit Caylin, PA-C  Active   montelukast  (SINGULAIR ) 10 MG tablet [id number] Yes Take 1 tablet (10 mg total) by mouth at bedtime. Parrett, Madelin RAMAN, NP  Active Self  pantoprazole  (PROTONIX ) 40 MG tablet [id number] Yes Take 40 mg by mouth daily before breakfast. [provider]  Active Self  Polyethyl Glycol-Propyl Glycol (SYSTANE) 0.4-0.3 % SOLN [id number] Yes Apply to eye. [provider]  Active Self  potassium chloride  SA (KLOR-CON  M) 20 MEQ tablet [id number]  Take 1 tablet (20 mEq total) by mouth 2 (two) times daily. 2 in the morning and 1 in the  evening  Patient taking differently: Take 20-40 mEq by mouth See admin instructions. Take 2 tablets (40 meq) by mouth in the morning and  take 1 tablet (20 meq) by mouth in the evening   Walker, Caitlin S, NP  Active  Self  Probiotic, Lactobacillus, CAPS [id number] Yes Take 1 capsule by mouth at bedtime. [provider]  Active Self  pyridoxine (B-6) 100 MG tablet [id number] Yes Take 100 mg by mouth in the morning. [provider]  Active Self  rosuvastatin  (CRESTOR ) 20 MG tablet [id number] Yes TAKE 1 TABLET BY MOUTH DAILY  Patient taking differently: Take 20 mg by mouth at bedtime.   Raford Riggs, MD  Active Self  spironolactone  (ALDACTONE ) 25 MG tablet [id number] Yes Take 0.5 tablets (12.5 mg total) by mouth daily. Vannie Reche RAMAN, NP  Active Self  torsemide  (DEMADEX ) 20 MG tablet [id number] Yes Take 3 tablets by mouth once daily Raford Riggs, MD  Active Self  traZODone  (DESYREL ) 50 MG tablet [id number] Yes TAKE 1 TABLET BY MOUTH AT BEDTIME AS NEEDED FOR SLEEP Timmy Maude SAUNDERS, MD  Active Self  Turmeric 500 MG CAPS [id number] Yes Take 500 mg by mouth in the morning. [provider]  Active Self  valACYclovir  (VALTREX ) 1000 MG tablet [id number] Yes Take 1,000 mg by mouth 2 (two) times daily as needed (breakouts). [provider]  Active Self  VITAMIN D  PO [id number] Yes Take 2,000 Units by mouth every evening. [provider]  Active Self            Home Care and Equipment/Supplies: Were Home Health Services Ordered?: NA Any new equipment or medical supplies ordered?: NA  Functional Questionnaire: Do you need assistance with bathing/showering or dressing?: No Do you need assistance with meal preparation?: No Do you need assistance with eating?: No Do you have difficulty maintaining continence: No Do you need assistance with getting out of bed/getting out of a chair/moving?: No Do you have difficulty managing or taking your medications?: No  Follow up appointments reviewed: PCP Follow-up appointment confirmed?: No (Patient will make contact with PCP) MD Provider Line Number:[phone] Given: No Specialist Hospital Follow-up appointment confirmed?:  Yes Date of Specialist follow-up appointment?: 05/01/24 Follow-Up Specialty Provider:: Dr. Debby Do you need transportation to your follow-up appointment?: No Do you understand care options if your condition(s) worsen?: Yes-patient verbalized understanding  SDOH Interventions Today    Flowsheet Row Most Recent Value  SDOH Interventions   Housing Interventions Intervention Not Indicated  Transportation Interventions Intervention Not Indicated  Utilities Interventions Intervention Not Indicated    Carliyah Cotterman J. Sparrow Sanzo RN, MSN [facility]  [facility], [facility] RN Care Manager Direct Dial: [phone]  Fax: [phone] Website: [email or web address]

## 2024-04-23 ENCOUNTER — Other Ambulatory Visit: Payer: Self-pay | Admitting: Hematology & Oncology

## 2024-04-24 DIAGNOSIS — M4802 Spinal stenosis, cervical region: Secondary | ICD-10-CM | POA: Diagnosis not present

## 2024-05-03 DIAGNOSIS — I10 Essential (primary) hypertension: Secondary | ICD-10-CM | POA: Diagnosis not present

## 2024-05-03 DIAGNOSIS — Z9181 History of falling: Secondary | ICD-10-CM | POA: Diagnosis not present

## 2024-05-03 DIAGNOSIS — R7303 Prediabetes: Secondary | ICD-10-CM | POA: Diagnosis not present

## 2024-05-03 DIAGNOSIS — M4802 Spinal stenosis, cervical region: Secondary | ICD-10-CM | POA: Diagnosis not present

## 2024-05-09 DIAGNOSIS — Z961 Presence of intraocular lens: Secondary | ICD-10-CM | POA: Diagnosis not present

## 2024-05-09 DIAGNOSIS — H40003 Preglaucoma, unspecified, bilateral: Secondary | ICD-10-CM | POA: Diagnosis not present

## 2024-05-09 DIAGNOSIS — H16229 Keratoconjunctivitis sicca, not specified as Sjogren's, unspecified eye: Secondary | ICD-10-CM | POA: Diagnosis not present

## 2024-05-09 DIAGNOSIS — H16121 Filamentary keratitis, right eye: Secondary | ICD-10-CM | POA: Diagnosis not present

## 2024-05-09 DIAGNOSIS — Z9842 Cataract extraction status, left eye: Secondary | ICD-10-CM | POA: Diagnosis not present

## 2024-05-09 DIAGNOSIS — H119 Unspecified disorder of conjunctiva: Secondary | ICD-10-CM | POA: Diagnosis not present

## 2024-05-09 DIAGNOSIS — H1132 Conjunctival hemorrhage, left eye: Secondary | ICD-10-CM | POA: Diagnosis not present

## 2024-05-09 DIAGNOSIS — Z79899 Other long term (current) drug therapy: Secondary | ICD-10-CM | POA: Diagnosis not present

## 2024-05-09 DIAGNOSIS — Z9841 Cataract extraction status, right eye: Secondary | ICD-10-CM | POA: Diagnosis not present

## 2024-05-13 ENCOUNTER — Ambulatory Visit: Admitting: *Deleted

## 2024-05-13 DIAGNOSIS — J4540 Moderate persistent asthma, uncomplicated: Secondary | ICD-10-CM | POA: Diagnosis not present

## 2024-05-13 LAB — PULMONARY FUNCTION TEST
DL/VA % pred: 107 %
DL/VA: 4.36 ml/min/mmHg/L
DLCO cor % pred: 67 %
DLCO cor: 13.89 ml/min/mmHg
DLCO unc % pred: 63 %
DLCO unc: 13 ml/min/mmHg
FEF 25-75 Post: 1.55 L/s
FEF 25-75 Pre: 1.4 L/s
FEF2575-%Change-Post: 10 %
FEF2575-%Pred-Post: 87 %
FEF2575-%Pred-Pre: 78 %
FEV1-%Change-Post: 2 %
FEV1-%Pred-Post: 60 %
FEV1-%Pred-Pre: 59 %
FEV1-Post: 1.4 L
FEV1-Pre: 1.37 L
FEV1FVC-%Change-Post: 1 %
FEV1FVC-%Pred-Pre: 109 %
FEV6-%Change-Post: 1 %
FEV6-%Pred-Post: 57 %
FEV6-%Pred-Pre: 56 %
FEV6-Post: 1.69 L
FEV6-Pre: 1.66 L
FEV6FVC-%Change-Post: 0 %
FEV6FVC-%Pred-Post: 104 %
FEV6FVC-%Pred-Pre: 104 %
FVC-%Change-Post: 0 %
FVC-%Pred-Post: 55 %
FVC-%Pred-Pre: 54 %
FVC-Post: 1.69 L
FVC-Pre: 1.67 L
Post FEV1/FVC ratio: 83 %
Post FEV6/FVC ratio: 100 %
Pre FEV1/FVC ratio: 82 %
Pre FEV6/FVC Ratio: 99 %
RV % pred: 83 %
RV: 1.99 L
TLC % pred: 69 %
TLC: 3.75 L

## 2024-05-13 NOTE — Progress Notes (Signed)
 Full PFT performed today.

## 2024-05-13 NOTE — Patient Instructions (Signed)
 Full PFT performed today.

## 2024-05-20 ENCOUNTER — Encounter (HOSPITAL_BASED_OUTPATIENT_CLINIC_OR_DEPARTMENT_OTHER): Payer: Self-pay | Admitting: Pulmonary Disease

## 2024-05-20 ENCOUNTER — Ambulatory Visit (INDEPENDENT_AMBULATORY_CARE_PROVIDER_SITE_OTHER): Admitting: Pulmonary Disease

## 2024-05-20 VITALS — BP 122/60 | HR 66 | Temp 97.7°F | Ht 66.0 in | Wt 187.9 lb

## 2024-05-20 DIAGNOSIS — J4540 Moderate persistent asthma, uncomplicated: Secondary | ICD-10-CM

## 2024-05-20 DIAGNOSIS — Z23 Encounter for immunization: Secondary | ICD-10-CM

## 2024-05-20 NOTE — Addendum Note (Signed)
 Addended by: TRUDY WARREN CROME on: 05/20/2024 03:33 PM   Modules accepted: Orders

## 2024-05-20 NOTE — Patient Instructions (Signed)
 X Flu shot  X PFT show restriction

## 2024-05-20 NOTE — Progress Notes (Signed)
 Subjective:    Patient ID: [name], female    DOB: 07-01-1951, 73 y.o.   MRN: [id number]   73 yo remote smoker for FU of shortness of breath. Being treated as asthma due to  - relief with Symbicort  -reversibility on PFTs    PMH -hypertension, HFpEF Lymphoma 2008 SLE on Plaquenil  ( sayed ) , bone marrow biopsy  negative for amyloid.  Syncope, has a loop recorder   She smoked less than 10 pack years starting in her 30s and quit in 1995. She reports a diagnosis of asthma many years ago Dr. Cloretta gave her albuterol , triggers being odors, seasonal allergies.    11/2023 asthma flare, (+Rhinovirus)  triggered by environmental allergens, including grass and perfume, with symptoms of dry throat, dry hacking cough, wheezing, and production of yellow to dark green sputum. Increased sensitivity to smells.    Discussed the use of AI scribe software for clinical note transcription with the patient, who gave verbal consent to proceed.  History of Present Illness [name] is a 73 year old female with asthma who presents for follow-up of asthma.  She has had no recent asthma exacerbations, and her symptoms are stable. A CT chest scan from a few months ago showed mild scarring at the lung bases and air trapping. She uses Trelegy once daily for asthma management, though she finds the powdery texture unpleasant and rinses her mouth after use.  Earlier in the year, she had a significant viral infection, likely rhinovirus, which exacerbated her symptoms. She is cautious about exposure to strong scents and allergens, as they can severely affect her breathing.  Lung function tests from this year and 2023 show a lung capacity of about sixty percent.    Significant tests/ events reviewed   BNP 213 RAST panel negative, IgE less than 2    05/2024 PFT >> ratio 82, FEV1 59, FVC 54% , TLC 69%, DLCO 63% , mod intraparenchymal restriction 09/2021 Spirometry -moderate restriction with some  bronchodilator response.  Ratio 83, FEV1 61%, FVC 57%, 17% improvement in FVC with albuterol     HRCT chest 03/2024 air trapping + no definite ILD CT abdomen 11/2016 in 2015 shows mild chronic bibasilar pulmonary scarring   06/2023 Esophagram Evidence of dysmotility with incomplete clearing of the esophagus , no GERD   Review of Systems  neg for any significant sore throat, dysphagia, itching, sneezing, nasal congestion or excess/ purulent secretions, fever, chills, sweats, unintended wt loss, pleuritic or exertional cp, hempoptysis, orthopnea pnd or change in chronic leg swelling. Also denies presyncope, palpitations, heartburn, abdominal pain, nausea, vomiting, diarrhea or change in bowel or urinary habits, dysuria,hematuria, rash, arthralgias, visual complaints, headache, numbness weakness or ataxia.      Objective:   Physical Exam  Gen. 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 Lung function tests show restriction with lung capacity at 60%, consistent with previous results. No significant change in lung function compared to 2022. Scarring not indicative of progressive fibrosis.  Air trapping suggests incomplete air expulsion. No immediate need for further testing. - Continue Trelegy inhaler once daily. - Encouraged aerobic exercise to maintain lung function. - Administered flu shot today. - Will repeat CT scan annually to monitor lung scarring and air trapping - unclear  cause of mod intraparenchymal restriction ? SLE related scarring, no significant ILD. - Will schedule follow-up every three months.  Systemic lupus erythematosus Well-controlled with no recent joint flare-ups. Continues on Plaquenil . - Continue Plaquenil .

## 2024-05-21 ENCOUNTER — Ambulatory Visit

## 2024-05-23 ENCOUNTER — Other Ambulatory Visit (HOSPITAL_BASED_OUTPATIENT_CLINIC_OR_DEPARTMENT_OTHER): Payer: Self-pay | Admitting: Family

## 2024-05-23 DIAGNOSIS — I10 Essential (primary) hypertension: Secondary | ICD-10-CM

## 2024-05-26 ENCOUNTER — Other Ambulatory Visit: Payer: Self-pay | Admitting: Hematology & Oncology

## 2024-06-11 DIAGNOSIS — R7303 Prediabetes: Secondary | ICD-10-CM | POA: Diagnosis not present

## 2024-06-11 DIAGNOSIS — N182 Chronic kidney disease, stage 2 (mild): Secondary | ICD-10-CM | POA: Diagnosis not present

## 2024-06-11 DIAGNOSIS — I10 Essential (primary) hypertension: Secondary | ICD-10-CM | POA: Diagnosis not present

## 2024-06-11 DIAGNOSIS — I5032 Chronic diastolic (congestive) heart failure: Secondary | ICD-10-CM | POA: Diagnosis not present

## 2024-06-11 DIAGNOSIS — E782 Mixed hyperlipidemia: Secondary | ICD-10-CM | POA: Diagnosis not present

## 2024-06-12 DIAGNOSIS — M4712 Other spondylosis with myelopathy, cervical region: Secondary | ICD-10-CM | POA: Diagnosis not present

## 2024-06-19 ENCOUNTER — Ambulatory Visit

## 2024-06-25 ENCOUNTER — Other Ambulatory Visit: Payer: Self-pay | Admitting: Hematology & Oncology

## 2024-07-02 ENCOUNTER — Ambulatory Visit (INDEPENDENT_AMBULATORY_CARE_PROVIDER_SITE_OTHER)

## 2024-07-02 ENCOUNTER — Encounter (HOSPITAL_BASED_OUTPATIENT_CLINIC_OR_DEPARTMENT_OTHER): Payer: Self-pay

## 2024-07-02 VITALS — BP 131/72 | HR 79 | Ht 66.0 in | Wt 188.0 lb

## 2024-07-02 DIAGNOSIS — J069 Acute upper respiratory infection, unspecified: Secondary | ICD-10-CM | POA: Diagnosis not present

## 2024-07-02 DIAGNOSIS — J4541 Moderate persistent asthma with (acute) exacerbation: Secondary | ICD-10-CM

## 2024-07-02 DIAGNOSIS — M940 Chondrocostal junction syndrome [Tietze]: Secondary | ICD-10-CM

## 2024-07-02 MED ORDER — PREDNISONE 10 MG PO TABS: ORAL_TABLET | ORAL | 0 refills | Status: AC

## 2024-07-02 MED ORDER — AMOXICILLIN-POT CLAVULANATE 875-125 MG PO TABS: 1.0000 | ORAL_TABLET | Freq: Two times a day (BID) | ORAL | 0 refills | Status: AC

## 2024-07-02 MED ORDER — FLUCONAZOLE 150 MG PO TABS: 150.0000 mg | ORAL_TABLET | Freq: Once | ORAL | 1 refills | Status: AC

## 2024-07-02 NOTE — Patient Instructions (Addendum)
 Start Augmentin  and Prednisone ; finish all medications.  Start OTC Coricidin HBP for help with cough/congestion.  Follow up in 4-6 weeks; return sooner if new or worsening symptoms.  Use Fluconazole  as needed for antibiotic induced yeast infection.

## 2024-07-02 NOTE — Progress Notes (Signed)
 "  @Patient  ID: [name], female    DOB: 1950-11-10, 73 y.o.   MRN: [id number]  Chief Complaint  Patient presents with   Acute Visit    Referring provider: Verdia Lombard, MD  HPI: Discussed the use of AI scribe software for clinical note transcription with the patient, who gave verbal consent to proceed.  History of Present Illness [name] is a 73 year old female with asthma who presents with a persistent cough and shortness of breath.  She experienced a sinus infection that began on December 14th and visited her primary care physician on December 17th, receiving a dexamethasone  shot that initially provided relief. However, her symptoms have persisted and worsened, particularly a cough that is more severe at night, causing her to cough almost all night long. She is able to expectorate only a small amount of sputum, which is yellow and sometimes blood-tinged.  She reports shortness of breath that has worsened over the past few days, particularly when walking from one room to another, significantly reducing her exercise tolerance. She has a history of asthma and has been using her albuterol  inhaler when wheezing, which initially improved her symptoms. She also uses a nebulizer machine and takes Trelegy and Singulair  daily for her asthma.  No facial pain, sore throat, fever, or chills but notes soreness and tightness in her chest, which she attributes to persistent coughing. She describes the soreness as 'kind of uncomfortable.' Her husband expressed concern about the possibility of pneumonia. She has not been prescribed antibiotics for this episode and has not taken any steroids beyond the initial shot.  She is currently taking Trelegy daily, Singulair  10 mg daily, and uses albuterol  as needed.  Last OV 05/20/2024 Mathias): 73 yo remote smoker for FU of shortness of breath. Being treated as asthma due to  - relief with Symbicort   -reversibility on PFTs    PMH  -hypertension, HFpEF Lymphoma 2008 SLE on Plaquenil  ( sayed ) , bone marrow biopsy  negative for amyloid.  Syncope, has a loop recorder   She smoked less than 10 pack years starting in her 30s and quit in 1995. She reports a diagnosis of asthma many years ago Dr. Cloretta gave her albuterol , triggers being odors, seasonal allergies.      11/2023 asthma flare, (+Rhinovirus)  triggered by environmental allergens, including grass and perfume, with symptoms of dry throat, dry hacking cough, wheezing, and production of yellow to dark green sputum. Increased sensitivity to smells.    [name] is a 73 year old female with asthma who presents for follow-up of asthma.   She has had no recent asthma exacerbations, and her symptoms are stable. A CT chest scan from a few months ago showed mild scarring at the lung bases and air trapping. She uses Trelegy once daily for asthma management, though she finds the powdery texture unpleasant and rinses her mouth after use.   Earlier in the year, she had a significant viral infection, likely rhinovirus, which exacerbated her symptoms. She is cautious about exposure to strong scents and allergens, as they can severely affect her breathing.   Lung function tests from this year and 2023 show a lung capacity of about sixty percent.     Significant tests/ events reviewed   BNP 213 RAST panel negative, IgE less than 2     05/2024 PFT >> ratio 82, FEV1 59, FVC 54% , TLC 69%, DLCO 63% , mod intraparenchymal restriction 09/2021 Spirometry -moderate restriction with some  bronchodilator response.  Ratio 83, FEV1 61%, FVC 57%, 17% improvement in FVC with albuterol      HRCT chest 03/2024 air trapping + no definite ILD CT abdomen 11/2016 in 2015 shows mild chronic bibasilar pulmonary scarring   06/2023 Esophagram Evidence of dysmotility with incomplete clearing of the esophagus , no GERD  Allergies[1]  Immunization History  Administered Date(s) Administered    Fluad Quad(high Dose 65+) 04/12/2022   INFLUENZA, HIGH DOSE SEASONAL PF 06/11/2014, 04/14/2015, 05/16/2016, 05/20/2024   Influenza, Quadrivalent, Recombinant, Inj, Pf 03/21/2017, 04/10/2018, 02/22/2019, 04/10/2020, 05/04/2021   Influenza-Unspecified 06/08/2011, 04/05/2012, 05/18/2012, 05/30/2012, 05/24/2013   PFIZER(Purple Top)SARS-COV-2 Vaccination 08/16/2019, 09/10/2019, 04/03/2020   PNEUMOCOCCAL CONJUGATE-20 11/09/2021   Pneumococcal Conjugate-13 09/12/2017   Pneumococcal Polysaccharide-23 05/27/2002, 10/17/2012, 08/17/2018, 10/11/2019   Tdap 04/05/2012   Zoster Recombinant(Shingrix) 11/02/2021, 03/22/2022    Past Medical History:  Diagnosis Date   Acid reflux    tx protonix    Ambulates with cane    straight  w/ four prongs   Arthritis    Asthma    has inhalers   Carotid stenosis 10/29/2020   Cataracts, bilateral    in [place]   Chronic diastolic heart failure (HCC) 08/30/2019   Chronic kidney disease    kidney failure 03/2007 - tx with dialysis x 3 - kidney functioned returned back to normal per pt on 04/11/24   Conjunctival amyloidosis (HCC)    right eye, 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   Diastolic heart failure (HCC) 2019   Gait instability 10/29/2020   uses cane   Headache    no current problems as of 04/11/24   HSV infection    Hypertension    Iron deficiency anemia 08/04/2023   hx iron infursions   Lupus 07/13/2011   Malignant lymphoma, 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     albuterol  (VENTOLIN  HFA)  108 (90 Base) MCG/ACT inhaler Inhale 2 puffs into the lungs every 6 (six) hours as needed for wheezing or shortness of breath. 8 g 6   aspirin  EC 81 MG tablet Take 81 mg by mouth at bedtime.     docusate sodium  (COLACE) 100 MG capsule Take 1 capsule (100 mg total) by mouth 2 (two) times daily as needed for mild constipation. 10 capsule 0   fluorometholone  (EFLONE) 0.1 % ophthalmic suspension Place 2 drops into the right eye 2 (two) times daily.     Fluticasone -Umeclidin-Vilant (TRELEGY ELLIPTA ) 200-62.5-25 MCG/ACT AEPB Inhale 1 puff into the lungs daily. 1 each 5   guaiFENesin -dextromethorphan  (ROBITUSSIN DM) 100-10 MG/5ML syrup Take 5-10 mLs by mouth every 8 (eight) hours as needed for cough.     HYDROcodone -acetaminophen  (NORCO/VICODIN) 5-325 MG tablet Take 1 tablet by mouth every 4 (four) hours as needed for moderate pain (pain score 4-6). 30 tablet 0   hydroxychloroquine  (PLAQUENIL ) 200 MG tablet Take 200 mg by mouth every evening.     latanoprost  (XALATAN ) 0.005 % ophthalmic solution Place 1 drop into both eyes at bedtime.     losartan  (COZAAR ) 50 MG  tablet Take 50 mg by mouth at bedtime.     methocarbamol  (ROBAXIN ) 500 MG tablet Take 1 tablet (500 mg total) by mouth every 6 (six) hours as needed for muscle spasms. 120 tablet 2   montelukast  (SINGULAIR ) 10 MG tablet Take 1 tablet (10 mg total) by mouth at bedtime. 30 tablet 11   pantoprazole  (PROTONIX ) 40 MG tablet Take 40 mg by mouth daily before breakfast.     Polyethyl Glycol-Propyl Glycol (SYSTANE) 0.4-0.3 % SOLN Apply to eye.     potassium chloride  SA (KLOR-CON  M) 20 MEQ tablet Take 1 tablet (20 mEq total) by mouth 2 (two) times daily. 2 in the morning and 1 in the evening (Patient taking differently: Take 20-40 mEq by mouth See admin instructions. Take 2 tablets (40 meq) by mouth in the morning and  take 1 tablet (20 meq) by mouth in the evening) 60 tablet 11   Probiotic, Lactobacillus, CAPS Take 1 capsule by mouth at bedtime.      pyridoxine (B-6) 100 MG tablet Take 100 mg by mouth in the morning.     rosuvastatin  (CRESTOR ) 20 MG tablet TAKE 1 TABLET BY MOUTH DAILY (Patient taking differently: Take 20 mg by mouth at bedtime.) 90 tablet 2   spironolactone  (ALDACTONE ) 25 MG tablet Take 1/2 (one-half) tablet by mouth once daily 15 tablet 3   torsemide  (DEMADEX ) 20 MG tablet Take 3 tablets by mouth once daily 270 tablet 2   traZODone  (DESYREL ) 50 MG tablet TAKE 1 TABLET BY MOUTH AT BEDTIME AS NEEDED FOR SLEEP 30 tablet 0   Turmeric 500 MG CAPS Take 500 mg by mouth in the morning.     valACYclovir  (VALTREX ) 1000 MG tablet Take 1,000 mg by mouth 2 (two) times daily as needed (breakouts).     VITAMIN D  PO Take 2,000 Units by mouth every evening.     No facility-administered medications prior to visit.     Review of Systems: as per hpi  Constitutional:   No  weight loss, night sweats,  Fevers, chills, fatigue, or  lassitude.  HEENT:   No headaches,  Difficulty swallowing,  Tooth/dental problems, or  Sore throat,                No sneezing, itching, ear ache, nasal congestion, post nasal drip,   CV:  No chest pain,  Orthopnea, PND, swelling in lower extremities, anasarca, dizziness, palpitations, syncope.   GI  No heartburn, indigestion, abdominal pain, nausea, vomiting, diarrhea, change in bowel habits, loss of appetite, bloody stools.   Resp: No shortness of breath with exertion or at rest.  No excess mucus, no productive cough,  No non-productive cough,  No coughing up of blood.  No change in color of mucus.  No wheezing.  No chest wall deformity  Skin: no rash or lesions.  GU: no dysuria, change in color of urine, no urgency or frequency.  No flank pain, no hematuria   MS:  No joint pain or swelling.  No decreased range of motion.  No back pain.    Physical Exam  BP 131/72   Pulse 79   Ht 5' 6 (1.676 m)   Wt 188 lb (85.3 kg)   SpO2 98%   BMI 30.34 kg/m   GEN: A/Ox3; pleasant , NAD, well nourished     HEENT:  [place]/AT,  EACs-clear, TMs-wnl, NOSE-clear, THROAT-clear, no lesions, no postnasal drip or exudate noted.   NECK:  Supple w/ fair ROM; no JVD; normal carotid impulses  w/o bruits; no thyromegaly or nodules palpated; no lymphadenopathy.    RESP  Clear but diminished throughout  P & A; w/o, wheezes/ rales/ or rhonchi. no accessory muscle use, no dullness to percussion  CARD:  RRR, no m/r/g, no peripheral edema, pulses intact, no cyanosis or clubbing.  + Reproducible rib discomfort anterior upper chest bilaterally  GI:   Soft & nt; nml bowel sounds; no organomegaly or masses detected.   Musco: Warm bil, no deformities or joint swelling noted.   Neuro: alert, no focal deficits noted.    Skin: Warm, no lesions or rashes    Lab Results:  CBC    Component Value Date/Time   WBC 6.4 04/08/2024 1016   WBC 10.9 (H) 11/29/2023 0950   RBC 3.68 (L) 04/08/2024 1017   RBC 3.65 (L) 04/08/2024 1016   HGB 11.5 (L) 04/08/2024 1016   HGB 12.7 05/31/2022 0954   HGB 12.2 05/18/2015 1256   HGB 9.6 (L) 12/17/2007 1011   HCT 34.8 (L) 04/08/2024 1016   HCT 38.0 05/31/2022 0954   HCT 37.6 05/18/2015 1256   HCT 27.7 (L) 12/17/2007 1011   PLT 149 (L) 04/08/2024 1016   PLT 120 (L) 05/31/2022 0954   MCV 95.3 04/08/2024 1016   MCV 91 05/31/2022 0954   MCV 93 05/18/2015 1256   MCV 98.3 12/17/2007 1011   MCH 31.5 04/08/2024 1016   MCHC 33.0 04/08/2024 1016   RDW 12.6 04/08/2024 1016   RDW 13.1 05/31/2022 0954   RDW 13.4 05/18/2015 1256   RDW 19.7 (H) 12/17/2007 1011   LYMPHSABS 1.8 04/08/2024 1016   LYMPHSABS 2.0 05/31/2022 0954   LYMPHSABS 1.1 05/18/2015 1256   LYMPHSABS 0.6 (L) 12/17/2007 1011   MONOABS 0.4 04/08/2024 1016   MONOABS 0.5 12/17/2007 1011   EOSABS 0.1 04/08/2024 1016   EOSABS 0.2 05/31/2022 0954   EOSABS 0.0 05/18/2015 1256   BASOSABS 0.0 04/08/2024 1016   BASOSABS 0.0 05/31/2022 0954   BASOSABS 0.0 05/18/2015 1256   BASOSABS 0.0 12/17/2007 1011    BMET     Component Value Date/Time   NA 139 04/08/2024 1016   NA 144 04/25/2023 1052   NA 142 05/18/2015 1257   K 3.8 04/08/2024 1016   K 3.6 05/18/2015 1257   CL 101 04/08/2024 1016   CL 104 01/15/2013 0844   CO2 27 04/08/2024 1016   CO2 26 05/18/2015 1257   GLUCOSE 108 (H) 04/08/2024 1016   GLUCOSE 90 05/18/2015 1257   GLUCOSE 103 01/15/2013 0844   BUN 13 04/08/2024 1016   BUN 12 04/25/2023 1052   BUN 12.6 05/18/2015 1257   CREATININE 1.05 (H) 04/08/2024 1016   CREATININE 0.8 05/18/2015 1257   CALCIUM  9.3 04/08/2024 1016   CALCIUM  9.1 05/18/2015 1257   GFRNONAA 55 (L) 04/08/2024 1016   GFRAA 75 01/30/2020 1241    BNP    Component Value Date/Time   BNP 142.7 (H) 11/25/2023 1835    ProBNP    Component Value Date/Time   PROBNP 213.0 (H) 08/24/2021 1512    Imaging: No results found.  Administration History     None          Latest Ref Rng & Units 05/13/2024   10:42 AM 09/03/2021    9:55 AM  PFT Results  FVC-Pre L 1.67  1.47   FVC-Predicted Pre % 54  57   FVC-Post L 1.69  1.65   FVC-Predicted Post % 55  65   Pre FEV1/FVC % %  82  83   Post FEV1/FCV % % 83  86   FEV1-Pre L 1.37  1.22   FEV1-Predicted Pre % 59  61   FEV1-Post L 1.40  1.42   DLCO uncorrected ml/min/mmHg 13.00    DLCO UNC% % 63    DLCO corrected ml/min/mmHg 13.89    DLCO COR %Predicted % 67    DLVA Predicted % 107    TLC L 3.75    TLC % Predicted % 69    RV % Predicted % 83      Lab Results  Component Value Date   NITRICOXIDE 31 02/10/2022     Assessment & Plan:   Assessment & Plan Moderate persistent asthma with exacerbation  Costochondritis, acute  Assessment and Plan Assessment & Plan Moderate persistent asthma with acute exacerbation Acute exacerbation likely triggered by upper respiratory infection. Previous improvement with dexamethasone  injection suggests steroid responsiveness. - Prescribed prednisone  taper to address inflammation and improve respiratory function.  Acute  costochondritis Likely secondary to persistent coughing. Ibuprofen contraindicated due to cardiac history. - Recommended Tylenol  for pain management.  Prednisone  will also help with inflammation.  Acute upper respiratory infection Symptoms persisting for approximately three weeks. Differential includes bacterial infection due to prolonged course. - Prescribed Augmentin  to address potential bacterial infection. - Prescribed Diflucan  to prevent potential yeast infection secondary to antibiotic use.  Due to prior history of the same. - Recommended Coricidin for cough management, ensuring it is compatible with hypertension history.   Return in about 6 weeks (around 08/13/2024).  Candis Dandy, PA-C 07/02/2024      [1]  Allergies Allergen Reactions   Iodinated Contrast Media Other (See Comments)    Kidney failure.  - Patient states she has no trouble with topical iodine  agents such as betadine    Erythromycin  Base Itching    Red streaks on arms   Other Cough    Smells of perfume, body wash, lotion, gas, and paint.  Wheezing   anesthesia -nausea/vomiting   Ciprofloxacin Itching and Rash   Oxycodone  Other (See Comments)    Hallucination.  [2]  Social History Tobacco Use  Smoking Status Former   Current packs/day: 0.00   Average packs/day: 1 pack/day for 10.0 years (10.0 ttl pk-yrs)   Types: Cigarettes   Start date: 06/17/1984   Quit date: 06/17/1994   Years since quitting: 30.0   Passive exposure: Past  Smokeless Tobacco Never   "

## 2024-07-12 ENCOUNTER — Ambulatory Visit
Admission: RE | Admit: 2024-07-12 | Discharge: 2024-07-12 | Disposition: A | Source: Ambulatory Visit | Attending: Internal Medicine | Admitting: Internal Medicine

## 2024-07-12 DIAGNOSIS — Z1231 Encounter for screening mammogram for malignant neoplasm of breast: Secondary | ICD-10-CM

## 2024-07-27 ENCOUNTER — Other Ambulatory Visit: Payer: Self-pay | Admitting: Hematology & Oncology

## 2024-08-06 ENCOUNTER — Inpatient Hospital Stay

## 2024-08-06 ENCOUNTER — Inpatient Hospital Stay: Admitting: Family

## 2024-08-09 ENCOUNTER — Other Ambulatory Visit

## 2024-08-09 ENCOUNTER — Ambulatory Visit: Admitting: Family

## 2024-08-12 ENCOUNTER — Inpatient Hospital Stay

## 2024-08-12 ENCOUNTER — Inpatient Hospital Stay: Admitting: Family

## 2024-08-13 ENCOUNTER — Ambulatory Visit (HOSPITAL_BASED_OUTPATIENT_CLINIC_OR_DEPARTMENT_OTHER)

## 2024-08-19 ENCOUNTER — Ambulatory Visit (HOSPITAL_BASED_OUTPATIENT_CLINIC_OR_DEPARTMENT_OTHER)

## 2024-11-04 ENCOUNTER — Ambulatory Visit (HOSPITAL_BASED_OUTPATIENT_CLINIC_OR_DEPARTMENT_OTHER): Admitting: Cardiovascular Disease
